# Patient Record
Sex: Female | Born: 1960 | Race: White | Hispanic: No | Smoking: Former smoker
Health system: Southern US, Community
[De-identification: ages and names within clinical notes are randomized; demographics above are authoritative.]

## PROBLEM LIST (undated history)

## (undated) DIAGNOSIS — T7840XA Allergy, unspecified, initial encounter: Secondary | ICD-10-CM

## (undated) DIAGNOSIS — G629 Polyneuropathy, unspecified: Secondary | ICD-10-CM

## (undated) DIAGNOSIS — J45909 Unspecified asthma, uncomplicated: Secondary | ICD-10-CM

## (undated) DIAGNOSIS — J449 Chronic obstructive pulmonary disease, unspecified: Secondary | ICD-10-CM

## (undated) DIAGNOSIS — M359 Systemic involvement of connective tissue, unspecified: Secondary | ICD-10-CM

## (undated) DIAGNOSIS — R519 Headache, unspecified: Secondary | ICD-10-CM

## (undated) DIAGNOSIS — K219 Gastro-esophageal reflux disease without esophagitis: Secondary | ICD-10-CM

## (undated) DIAGNOSIS — M797 Fibromyalgia: Secondary | ICD-10-CM

## (undated) DIAGNOSIS — G709 Myoneural disorder, unspecified: Secondary | ICD-10-CM

## (undated) DIAGNOSIS — R490 Dysphonia: Secondary | ICD-10-CM

## (undated) DIAGNOSIS — Z923 Personal history of irradiation: Secondary | ICD-10-CM

## (undated) DIAGNOSIS — K5792 Diverticulitis of intestine, part unspecified, without perforation or abscess without bleeding: Secondary | ICD-10-CM

## (undated) DIAGNOSIS — H9192 Unspecified hearing loss, left ear: Secondary | ICD-10-CM

## (undated) DIAGNOSIS — C801 Malignant (primary) neoplasm, unspecified: Secondary | ICD-10-CM

## (undated) DIAGNOSIS — I059 Rheumatic mitral valve disease, unspecified: Secondary | ICD-10-CM

## (undated) DIAGNOSIS — R1314 Dysphagia, pharyngoesophageal phase: Secondary | ICD-10-CM

## (undated) DIAGNOSIS — M329 Systemic lupus erythematosus, unspecified: Secondary | ICD-10-CM

## (undated) DIAGNOSIS — J383 Other diseases of vocal cords: Secondary | ICD-10-CM

## (undated) DIAGNOSIS — M1990 Unspecified osteoarthritis, unspecified site: Secondary | ICD-10-CM

## (undated) DIAGNOSIS — M069 Rheumatoid arthritis, unspecified: Secondary | ICD-10-CM

## (undated) DIAGNOSIS — M810 Age-related osteoporosis without current pathological fracture: Secondary | ICD-10-CM

## (undated) DIAGNOSIS — Z9221 Personal history of antineoplastic chemotherapy: Secondary | ICD-10-CM

## (undated) DIAGNOSIS — IMO0002 Reserved for concepts with insufficient information to code with codable children: Secondary | ICD-10-CM

## (undated) DIAGNOSIS — G35 Multiple sclerosis: Secondary | ICD-10-CM

## (undated) DIAGNOSIS — R569 Unspecified convulsions: Secondary | ICD-10-CM

## (undated) DIAGNOSIS — K589 Irritable bowel syndrome without diarrhea: Secondary | ICD-10-CM

## (undated) HISTORY — PX: TOTAL HIP ARTHROPLASTY: SHX124

## (undated) HISTORY — DX: Unspecified osteoarthritis, unspecified site: M19.90

## (undated) HISTORY — PX: BREAST SURGERY: SHX581

## (undated) HISTORY — PX: CHOLECYSTECTOMY: SHX55

## (undated) HISTORY — PX: APPENDECTOMY: SHX54

## (undated) HISTORY — PX: ABDOMINAL HYSTERECTOMY: SHX81

## (undated) HISTORY — PX: PATENT DUCTUS ARTERIOUS REPAIR: SHX269

## (undated) HISTORY — DX: Gastro-esophageal reflux disease without esophagitis: K21.9

## (undated) HISTORY — DX: Rheumatoid arthritis, unspecified: M06.9

## (undated) HISTORY — DX: Unspecified convulsions: R56.9

## (undated) HISTORY — DX: Myoneural disorder, unspecified: G70.9

## (undated) HISTORY — DX: Fibromyalgia: M79.7

## (undated) HISTORY — DX: Allergy, unspecified, initial encounter: T78.40XA

## (undated) HISTORY — DX: Unspecified hearing loss, left ear: H91.92

## (undated) HISTORY — PX: RECONSTRUCTION TENDON PULLEY HAND: SUR1090

---
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
## 2002-08-07 HISTORY — PX: BREAST EXCISIONAL BIOPSY: SUR124

## 2012-04-19 LAB — PULMONARY FUNCTION TEST

## 2014-06-29 LAB — PULMONARY FUNCTION TEST

## 2015-08-08 DIAGNOSIS — Z923 Personal history of irradiation: Secondary | ICD-10-CM

## 2015-08-08 HISTORY — DX: Personal history of irradiation: Z92.3

## 2015-08-08 HISTORY — PX: BREAST LUMPECTOMY: SHX2

## 2015-08-08 HISTORY — PX: BREAST EXCISIONAL BIOPSY: SUR124

## 2017-11-16 LAB — BASIC METABOLIC PANEL
BUN: 14 (ref 4–21)
Creatinine: 0.6 (ref <=1.1)
Glucose: 114
Potassium: 4.1 (ref 3.4–5.3)
SODIUM: 142 (ref 137–147)

## 2017-11-16 LAB — HEPATIC FUNCTION PANEL
ALK PHOS: 57 (ref 25–125)
ALT: 19 (ref 7–35)
AST: 17 (ref 13–35)

## 2018-05-05 ENCOUNTER — Encounter: Payer: Self-pay | Admitting: Emergency Medicine

## 2018-05-05 ENCOUNTER — Other Ambulatory Visit: Payer: Self-pay

## 2018-05-05 ENCOUNTER — Emergency Department
Admission: EM | Admit: 2018-05-05 | Discharge: 2018-05-06 | Disposition: A | Discharge status: Home / Self Care | Payer: Medicare Other | Attending: Emergency Medicine | Admitting: Emergency Medicine

## 2018-05-05 DIAGNOSIS — J45909 Unspecified asthma, uncomplicated: Secondary | ICD-10-CM | POA: Diagnosis not present

## 2018-05-05 DIAGNOSIS — K5900 Constipation, unspecified: Secondary | ICD-10-CM | POA: Diagnosis not present

## 2018-05-05 DIAGNOSIS — M791 Myalgia, unspecified site: Secondary | ICD-10-CM

## 2018-05-05 DIAGNOSIS — M7918 Myalgia, other site: Secondary | ICD-10-CM | POA: Insufficient documentation

## 2018-05-05 DIAGNOSIS — G35 Multiple sclerosis: Secondary | ICD-10-CM | POA: Diagnosis not present

## 2018-05-05 DIAGNOSIS — Z853 Personal history of malignant neoplasm of breast: Secondary | ICD-10-CM | POA: Diagnosis not present

## 2018-05-05 DIAGNOSIS — R1032 Left lower quadrant pain: Secondary | ICD-10-CM

## 2018-05-05 DIAGNOSIS — Z9049 Acquired absence of other specified parts of digestive tract: Secondary | ICD-10-CM | POA: Diagnosis not present

## 2018-05-05 DIAGNOSIS — Z760 Encounter for issue of repeat prescription: Secondary | ICD-10-CM

## 2018-05-05 HISTORY — DX: Rheumatic mitral valve disease, unspecified: I05.9

## 2018-05-05 HISTORY — DX: Systemic lupus erythematosus, unspecified: M32.9

## 2018-05-05 HISTORY — DX: Chronic obstructive pulmonary disease, unspecified: J44.9

## 2018-05-05 HISTORY — DX: Reserved for concepts with insufficient information to code with codable children: IMO0002

## 2018-05-05 HISTORY — DX: Malignant (primary) neoplasm, unspecified: C80.1

## 2018-05-05 HISTORY — DX: Diverticulitis of intestine, part unspecified, without perforation or abscess without bleeding: K57.92

## 2018-05-05 HISTORY — DX: Multiple sclerosis: G35

## 2018-05-05 HISTORY — DX: Unspecified asthma, uncomplicated: J45.909

## 2018-05-05 HISTORY — DX: Irritable bowel syndrome, unspecified: K58.9

## 2018-05-05 LAB — URINALYSIS, COMPLETE (UACMP) WITH MICROSCOPIC
BILIRUBIN URINE: NEGATIVE
Bacteria, UA: NONE SEEN
Glucose, UA: NEGATIVE mg/dL
Hgb urine dipstick: NEGATIVE
KETONES UR: NEGATIVE mg/dL
Leukocytes, UA: NEGATIVE
NITRITE: NEGATIVE
PH: 5 (ref 5.0–8.0)
Protein, ur: NEGATIVE mg/dL
Specific Gravity, Urine: 1.015 (ref 1.005–1.030)

## 2018-05-05 NOTE — ED Triage Notes (Signed)
Pt arrives via [facility] with c/o generalized body aches. Pt reports that she has not had her MS medication since June. Pt is in NAD.

## 2018-05-05 NOTE — ED Notes (Signed)
Pt has power port. Will consult with EDP on whether or not to access.

## 2018-05-06 ENCOUNTER — Encounter: Payer: Self-pay | Admitting: Radiology

## 2018-05-06 ENCOUNTER — Emergency Department: Payer: Medicare Other

## 2018-05-06 DIAGNOSIS — M7918 Myalgia, other site: Secondary | ICD-10-CM | POA: Diagnosis not present

## 2018-05-06 LAB — COMPREHENSIVE METABOLIC PANEL
ALBUMIN: 3.9 g/dL (ref 3.5–5.0)
ALT: 26 U/L (ref 0–44)
ANION GAP: 6 (ref 5–15)
AST: 24 U/L (ref 15–41)
Alkaline Phosphatase: 69 U/L (ref 38–126)
BUN: 21 mg/dL — ABNORMAL HIGH (ref 6–20)
CO2: 26 mmol/L (ref 22–32)
Calcium: 9.1 mg/dL (ref 8.9–10.3)
Chloride: 108 mmol/L (ref 98–111)
Creatinine, Ser: 0.56 mg/dL (ref 0.44–1.00)
GFR calc non Af Amer: >60 mL/min (ref >60)
GLUCOSE: 107 mg/dL — AB (ref 70–99)
POTASSIUM: 3.8 mmol/L (ref 3.5–5.1)
SODIUM: 140 mmol/L (ref 135–145)
TOTAL PROTEIN: 6.4 g/dL — AB (ref 6.5–8.1)
Total Bilirubin: 0.4 mg/dL (ref 0.3–1.2)

## 2018-05-06 LAB — CBC WITH DIFFERENTIAL/PLATELET
BASOS PCT: 1 %
Basophils Absolute: 0 10*3/uL (ref 0–0.1)
EOS ABS: 0.1 10*3/uL (ref 0–0.7)
Eosinophils Relative: 2 %
HCT: 35.3 % (ref 35.0–47.0)
Hemoglobin: 12.3 g/dL (ref 12.0–16.0)
LYMPHS ABS: 1.6 10*3/uL (ref 1.0–3.6)
Lymphocytes Relative: 27 %
MCH: 30.6 pg (ref 26.0–34.0)
MCHC: 34.7 g/dL (ref 32.0–36.0)
MCV: 88.1 fL (ref 80.0–100.0)
MONO ABS: 0.4 10*3/uL (ref 0.2–0.9)
MONOS PCT: 6 %
Neutro Abs: 3.7 10*3/uL (ref 1.4–6.5)
Neutrophils Relative %: 64 %
Platelets: 158 10*3/uL (ref 150–440)
RBC: 4.01 MIL/uL (ref 3.80–5.20)
RDW: 15.2 % — AB (ref 11.5–14.5)
WBC: 5.8 10*3/uL (ref 3.6–11.0)

## 2018-05-06 LAB — SEDIMENTATION RATE: Sed Rate: 20 mm/hr (ref 0–30)

## 2018-05-06 MED ORDER — IOPAMIDOL (ISOVUE-300) INJECTION 61%
100.0000 mL | Freq: Once | INTRAVENOUS | Status: AC | PRN
  Administered 2018-05-06: 100 mL via INTRAVENOUS

## 2018-05-06 MED ORDER — HEPARIN SOD (PORK) LOCK FLUSH 10 UNIT/ML IV SOLN
INTRAVENOUS | Status: AC
  Administered 2018-05-06: 50 [IU]
  Filled 2018-05-06: qty 1

## 2018-05-06 MED ORDER — MORPHINE SULFATE (PF) 4 MG/ML IV SOLN
4.0000 mg | Freq: Once | INTRAVENOUS | Status: AC
  Administered 2018-05-06: 4 mg via INTRAVENOUS
  Filled 2018-05-06: qty 1

## 2018-05-06 MED ORDER — LACTULOSE 10 GM/15ML PO SOLN: 20.0000 g | Freq: Every day | ORAL | 0 refills | Status: DC | PRN

## 2018-05-06 MED ORDER — TIOTROPIUM BROMIDE MONOHYDRATE 2.5 MCG/ACT IN AERS: 2.0000 | INHALATION_SPRAY | Freq: Every morning | RESPIRATORY_TRACT | 0 refills | Status: DC

## 2018-05-06 MED ORDER — BUDESONIDE-FORMOTEROL FUMARATE 160-4.5 MCG/ACT IN AERO: 2.0000 | INHALATION_SPRAY | Freq: Two times a day (BID) | RESPIRATORY_TRACT | 12 refills | Status: AC

## 2018-05-06 MED ORDER — ONDANSETRON HCL 4 MG/2ML IJ SOLN
4.0000 mg | Freq: Once | INTRAMUSCULAR | Status: AC
  Administered 2018-05-06: 4 mg via INTRAVENOUS

## 2018-05-06 MED ORDER — HYDROCODONE-ACETAMINOPHEN 5-325 MG PO TABS: 1.0000 | ORAL_TABLET | Freq: Four times a day (QID) | ORAL | 0 refills | Status: DC | PRN

## 2018-05-06 MED ORDER — ONDANSETRON HCL 4 MG/2ML IJ SOLN
INTRAMUSCULAR | Status: AC
  Filled 2018-05-06: qty 2

## 2018-05-06 MED ORDER — SODIUM CHLORIDE 0.9 % IV BOLUS
1000.0000 mL | Freq: Once | INTRAVENOUS | Status: AC
  Administered 2018-05-06: 1000 mL via INTRAVENOUS

## 2018-05-06 MED ORDER — IOPAMIDOL (ISOVUE-300) INJECTION 61%
30.0000 mL | Freq: Once | INTRAVENOUS | Status: AC | PRN
  Administered 2018-05-06: 30 mL via ORAL

## 2018-05-06 MED ORDER — OMEPRAZOLE 40 MG PO CPDR: 40.0000 mg | DELAYED_RELEASE_CAPSULE | Freq: Every day | ORAL | 0 refills | Status: DC

## 2018-05-06 MED ORDER — ONDANSETRON 4 MG PO TBDP: 4.0000 mg | ORAL_TABLET | Freq: Three times a day (TID) | ORAL | 0 refills | Status: DC | PRN

## 2018-05-06 MED ORDER — DABIGATRAN ETEXILATE MESYLATE 150 MG PO CAPS: 150.0000 mg | ORAL_CAPSULE | Freq: Two times a day (BID) | ORAL | 0 refills | Status: DC

## 2018-05-06 MED ORDER — KETOROLAC TROMETHAMINE 30 MG/ML IJ SOLN
30.0000 mg | Freq: Once | INTRAMUSCULAR | Status: AC
  Administered 2018-05-06: 30 mg via INTRAVENOUS
  Filled 2018-05-06: qty 1

## 2018-05-06 NOTE — Discharge Instructions (Signed)
1.  I have refilled your Pradaxa, omeprazole, Symbicort and Spiriva.  Please establish primary and specialty care at the numbers given above to refill your other medications. 2.  You may take lactulose as needed for bowel movements. 3.  You may take pain and nausea medicines as needed (Norco/Zofran #15). 4.  Return to the ER for worsening symptoms, persistent vomiting, difficulty breathing or other concerns.

## 2018-05-06 NOTE — ED Notes (Signed)
Ems here to pick pt up.  

## 2018-05-06 NOTE — ED Notes (Signed)
Heparin flush administered to pt's port before de-accessing. Pt tolerated well with no issues.

## 2018-05-06 NOTE — ED Provider Notes (Signed)
[facility] Emergency Department Provider Note   ____________________________________________   First MD Initiated Contact with Patient 05/06/18 0023     (approximate)  I have reviewed the triage vital signs and the nursing notes.   HISTORY  Chief Complaint Generalized Body Aches    HPI Victoria [name] is a 57 y.o. female who presents to the ED from home with a chief complaint of generalized body aches.  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  Patient reports she has not had any of her medications since moving here from [place] 3 months ago.  Her medications include MS medicines as well as Pradaxa.  States she had generalized body aches today.  Had a mechanical fall with lower back pain.  Prior to that patient had gradual onset left lower quadrant abdominal pain associated with nausea.  History of diverticulitis.  Denies associated fever, chills, chest pain, shortness of breath, dysuria.  Had some diarrhea prior to arrival which has since resolved.  Denies recent trauma or travel.   Past Medical History:  Diagnosis Date  . Asthma   . Cancer (HCC)    Breast Left  . COPD (chronic obstructive pulmonary disease) ([place])   . Diverticulitis   . IBS (irritable bowel syndrome)   . Lupus ([place])   . Mitral valve disorder   . MS (multiple sclerosis) ([place])     There are no active problems to display for this patient.   Past Surgical History:  Procedure Laterality Date  . ABDOMINAL HYSTERECTOMY    . APPENDECTOMY    . BREAST SURGERY    . CHOLECYSTECTOMY      Prior to Admission medications   Medication Sig Start Date End Date Taking? Authorizing Provider  budesonide-formoterol (SYMBICORT) 160-4.5 MCG/ACT inhaler Inhale 2 puffs into the lungs 2 (two) times daily. 05/06/18   Paulette Blanch, MD  dabigatran (PRADAXA) 150 MG CAPS capsule Take 1 capsule (150  mg total) by mouth 2 (two) times daily. 05/06/18   Paulette Blanch, MD  HYDROcodone-acetaminophen (NORCO) 5-325 MG tablet Take 1 tablet by mouth every 6 (six) hours as needed for moderate pain. 05/06/18   Paulette Blanch, MD  lactulose (CHRONULAC) 10 GM/15ML solution Take 30 mLs (20 g total) by mouth daily as needed for mild constipation. 05/06/18   Paulette Blanch, MD  omeprazole (PRILOSEC) 40 MG capsule Take 1 capsule (40 mg total) by mouth daily. 05/06/18   Paulette Blanch, MD  ondansetron (ZOFRAN ODT) 4 MG disintegrating tablet Take 1 tablet (4 mg total) by mouth every 8 (eight) hours as needed for nausea or vomiting. 05/06/18   Paulette Blanch, MD  Tiotropium Bromide Monohydrate (SPIRIVA RESPIMAT) 2.5 MCG/ACT AERS Inhale 2 puffs into the lungs every morning. 05/06/18   Paulette Blanch, MD    Allergies Codeine and Sulfa antibiotics  No family history on file.  Social History Social History   Tobacco Use  . Smoking status: Never Smoker  . Smokeless tobacco: Never Used  Substance Use Topics  . Alcohol use: Yes    Frequency: Never    Comment: Rarely  . Drug use: Never    Review of Systems  Constitutional: Positive for generalized body aches.  No fever/chills Eyes: No visual changes. ENT: No sore throat. Cardiovascular: Denies chest pain. Respiratory: Denies shortness of breath. Gastrointestinal: For left lower quadrant abdominal pain, nausea, vomiting  and diarrhea.  No constipation. Genitourinary: Negative for dysuria. Musculoskeletal: Negative for back pain. Skin: Negative for rash. Neurological: Negative for headaches, focal weakness or numbness.   ____________________________________________   PHYSICAL EXAM:  VITAL SIGNS: ED Triage Vitals  Enc Vitals Group     BP --      Pulse --      Resp --      Temp --      Temp src --      SpO2 --      Weight 05/05/18 2221 150 lb (68 kg)     Height 05/05/18 2221 4\' 7"  (1.397 m)     Head Circumference --      Peak Flow --      Pain Score  05/05/18 2211 10     Pain Loc --      Pain Edu? --      Excl. in [place]? --     Constitutional: Alert and oriented. Well appearing and in mild acute distress. Eyes: Conjunctivae are normal. PERRL. EOMI. Head: Atraumatic. Nose: No congestion/rhinnorhea. Mouth/Throat: Mucous membranes are moist.  Oropharynx non-erythematous. Neck: No stridor.  Supple neck without meningismus. Cardiovascular: Normal rate, regular rhythm. Grossly normal heart sounds.  Good peripheral circulation. Respiratory: Normal respiratory effort.  No retractions. Lungs CTAB. Gastrointestinal: Soft and mildly tender to palpation left lower quadrant without rebound or guarding. No distention. No abdominal bruits. No CVA tenderness. Musculoskeletal: No lower extremity tenderness nor edema.  No joint effusions. Neurologic:  Normal speech and language. No gross focal neurologic deficits are appreciated. No gait instability. Skin:  Skin is warm, dry and intact. No rash noted. Psychiatric: Mood and affect are normal. Speech and behavior are normal.  ____________________________________________   LABS (all labs ordered are listed, but only abnormal results are displayed)  Labs Reviewed  URINALYSIS, COMPLETE (UACMP) WITH MICROSCOPIC - Abnormal; Notable for the following components:      Result Value   Color, Urine YELLOW (*)    APPearance CLEAR (*)    All other components within normal limits  CBC WITH DIFFERENTIAL/PLATELET - Abnormal; Notable for the following components:   RDW 15.2 (*)    All other components within normal limits  COMPREHENSIVE METABOLIC PANEL - Abnormal; Notable for the following components:   Glucose, Bld 107 (*)    BUN 21 (*)    Total Protein 6.4 (*)    All other components within normal limits  SEDIMENTATION RATE   ____________________________________________  EKG  None ____________________________________________  RADIOLOGY  ED MD interpretation: Unremarkable CT abdomen  pelvis  Official radiology report(s): Ct Abdomen Pelvis W Contrast  Result Date: 05/06/2018 CLINICAL DATA:  Generalized body aches. Off multiple sclerosis medications since June. History of LEFT breast cancer, appendectomy, hysterectomy and cholecystectomy. EXAM: CT ABDOMEN AND PELVIS WITH CONTRAST TECHNIQUE: Multidetector CT imaging of the abdomen and pelvis was performed using the standard protocol following bolus administration of intravenous contrast. CONTRAST:  145mL ISOVUE-300 IOPAMIDOL (ISOVUE-300) INJECTION 61% COMPARISON:  None. FINDINGS: LOWER CHEST: Lung bases are clear. Included heart size is normal. No pericardial effusion. HEPATOBILIARY: Status post cholecystectomy. Normal CT liver. PANCREAS: Normal. SPLEEN: Normal. ADRENALS/URINARY TRACT: Kidneys are orthotopic, demonstrating symmetric enhancement. No nephrolithiasis, hydronephrosis or solid renal masses. The unopacified ureters are normal in course and caliber. Delayed imaging through the kidneys demonstrates symmetric prompt contrast excretion within the proximal urinary collecting system. Urinary bladder is partially distended and unremarkable. Normal adrenal glands. STOMACH/BOWEL: The stomach, small and large bowel are normal in course and  caliber without inflammatory changes. Moderate descending and sigmoid colonic diverticulosis. Enteric contrast has not yet reached the distal small bowel. Small amount of small bowel feces compatible with chronic stasis. VASCULAR/LYMPHATIC: Aortoiliac vessels are normal in course and caliber. Minimal calcific atherosclerosis. Inferior vena cava filter at the level the renal veins. No lymphadenopathy by CT size criteria. REPRODUCTIVE: Status post hysterectomy. OTHER: No intraperitoneal free fluid or free air. Small fat containing umbilical hernia. MUSCULOSKELETAL: Nonacute. Streak artifact from RIGHT hip arthroplasty. Mild lower lumbar facet arthropathy. IMPRESSION: 1. No acute intra-abdominal or pelvic  process. 2. Colonic diverticulosis without acute diverticulitis. Aortic Atherosclerosis (ICD10-I70.0). Electronically Signed   By: Elon Alas M.D.   On: 05/06/2018 02:50    ____________________________________________   PROCEDURES  Procedure(s) performed: None  Procedures  Critical Care performed: No  ____________________________________________   INITIAL IMPRESSION / ASSESSMENT AND PLAN / ED COURSE  As part of my medical decision making, I reviewed the following data within the [facility] notes reviewed and incorporated, Labs reviewed, Old chart reviewed, Radiograph reviewed  and Notes from prior ED visits   57 year old female with lupus/possible MS, IBS, history of diverticulitis who presents with generalized body pain and left lower quadrant abdominal pain. Differential diagnosis includes, but is not limited to, ovarian cyst, ovarian torsion, acute appendicitis, diverticulitis, urinary tract infection/pyelonephritis, endometriosis, bowel obstruction, colitis, renal colic, gastroenteritis, hernia, etc.  Will obtain basic lab work including sed rate.  Initiate IV fluid resuscitation.  4 mg IV morphine paired with 4 mg IV Zofran given for pain and nausea.  Add IV Toradol.  Will proceed with CT abdomen/pelvis to evaluate etiology of patient's abdominal pain.  Clinical Course as of May 06 352  Mon May 06, 2018  0343 Patient afebrile on taking oral temperature-98.8 F.  Updated her on unremarkable CT abdomen/pelvis.  I personally reviewed the CT scan and note moderate stool burden in her lower colon.  Will prescribe lactulose as needed.  Will refill her Pradaxa, omeprazole, Symbicort and Spiriva.  I am uncomfortable refilling her prescriptions for trazodone, letrozole, leflunomide, and cyclobenzaprine. Refer for PCP follow-up.  Strict return precautions given.  Patient verbalizes understanding and agrees with plan of care.   [JS]    Clinical Course User  Index [JS] Paulette Blanch, MD     ____________________________________________   FINAL CLINICAL IMPRESSION(S) / ED DIAGNOSES  Final diagnoses:  Myalgia  Left lower quadrant pain  Constipation, unspecified constipation type     ED Discharge Orders         Ordered    dabigatran (PRADAXA) 150 MG CAPS capsule  2 times daily     05/06/18 0351    omeprazole (PRILOSEC) 40 MG capsule  Daily     05/06/18 0351    budesonide-formoterol (SYMBICORT) 160-4.5 MCG/ACT inhaler  2 times daily     05/06/18 0351    Tiotropium Bromide Monohydrate (SPIRIVA RESPIMAT) 2.5 MCG/ACT AERS   Every morning - 10a     05/06/18 0351    HYDROcodone-acetaminophen (NORCO) 5-325 MG tablet  Every 6 hours PRN     05/06/18 0352    ondansetron (ZOFRAN ODT) 4 MG disintegrating tablet  Every 8 hours PRN     05/06/18 0352    lactulose (CHRONULAC) 10 GM/15ML solution  Daily PRN     05/06/18 0352           Note:  This document was prepared using Dragon voice recognition software and may include unintentional dictation errors.  Paulette Blanch, MD 05/06/18 [phone]

## 2018-05-06 NOTE — ED Notes (Signed)
Patient transported to CT 

## 2018-05-06 NOTE — ED Notes (Signed)
Pt assisted up to commode to void.  

## 2018-06-04 ENCOUNTER — Encounter: Payer: Self-pay | Admitting: Emergency Medicine

## 2018-06-04 ENCOUNTER — Other Ambulatory Visit: Payer: Self-pay

## 2018-06-04 ENCOUNTER — Emergency Department: Payer: Medicare Other

## 2018-06-04 ENCOUNTER — Emergency Department
Admission: EM | Admit: 2018-06-04 | Discharge: 2018-06-04 | Disposition: A | Discharge status: Home / Self Care | Payer: Medicare Other | Attending: Emergency Medicine | Admitting: Emergency Medicine

## 2018-06-04 DIAGNOSIS — Z79899 Other long term (current) drug therapy: Secondary | ICD-10-CM | POA: Diagnosis not present

## 2018-06-04 DIAGNOSIS — Z9049 Acquired absence of other specified parts of digestive tract: Secondary | ICD-10-CM | POA: Diagnosis not present

## 2018-06-04 DIAGNOSIS — R52 Pain, unspecified: Secondary | ICD-10-CM

## 2018-06-04 DIAGNOSIS — G35 Multiple sclerosis: Secondary | ICD-10-CM | POA: Insufficient documentation

## 2018-06-04 DIAGNOSIS — J45909 Unspecified asthma, uncomplicated: Secondary | ICD-10-CM | POA: Diagnosis not present

## 2018-06-04 DIAGNOSIS — R519 Headache, unspecified: Secondary | ICD-10-CM

## 2018-06-04 DIAGNOSIS — M79601 Pain in right arm: Secondary | ICD-10-CM | POA: Insufficient documentation

## 2018-06-04 DIAGNOSIS — Z853 Personal history of malignant neoplasm of breast: Secondary | ICD-10-CM | POA: Diagnosis not present

## 2018-06-04 DIAGNOSIS — R51 Headache: Secondary | ICD-10-CM | POA: Insufficient documentation

## 2018-06-04 DIAGNOSIS — M5410 Radiculopathy, site unspecified: Secondary | ICD-10-CM | POA: Insufficient documentation

## 2018-06-04 DIAGNOSIS — J449 Chronic obstructive pulmonary disease, unspecified: Secondary | ICD-10-CM | POA: Diagnosis not present

## 2018-06-04 DIAGNOSIS — R531 Weakness: Secondary | ICD-10-CM | POA: Diagnosis present

## 2018-06-04 LAB — COMPREHENSIVE METABOLIC PANEL
ALK PHOS: 62 U/L (ref 38–126)
ALT: 24 U/L (ref 0–44)
ANION GAP: 11 (ref 5–15)
AST: 21 U/L (ref 15–41)
Albumin: 3.7 g/dL (ref 3.5–5.0)
BILIRUBIN TOTAL: 0.4 mg/dL (ref 0.3–1.2)
BUN: 18 mg/dL (ref 6–20)
CALCIUM: 9.2 mg/dL (ref 8.9–10.3)
CO2: 23 mmol/L (ref 22–32)
CREATININE: 0.55 mg/dL (ref 0.44–1.00)
Chloride: 106 mmol/L (ref 98–111)
Glucose, Bld: 88 mg/dL (ref 70–99)
Potassium: 3.8 mmol/L (ref 3.5–5.1)
SODIUM: 140 mmol/L (ref 135–145)
TOTAL PROTEIN: 6.2 g/dL — AB (ref 6.5–8.1)

## 2018-06-04 LAB — CBC
HCT: 37.1 % (ref 36.0–46.0)
Hemoglobin: 11.7 g/dL — ABNORMAL LOW (ref 12.0–15.0)
MCH: 29 pg (ref 26.0–34.0)
MCHC: 31.5 g/dL (ref 30.0–36.0)
MCV: 91.8 fL (ref 80.0–100.0)
NRBC: 0 % (ref 0.0–0.2)
Platelets: 138 10*3/uL — ABNORMAL LOW (ref 150–400)
RBC: 4.04 MIL/uL (ref 3.87–5.11)
RDW: 14.6 % (ref 11.5–15.5)
WBC: 4.5 10*3/uL (ref 4.0–10.5)

## 2018-06-04 LAB — TROPONIN I: Troponin I: <0.03 ng/mL (ref <0.03)

## 2018-06-04 LAB — PROTIME-INR
INR: 1.04
PROTHROMBIN TIME: 13.5 s (ref 11.4–15.2)

## 2018-06-04 MED ORDER — PREDNISONE 10 MG (21) PO TBPK: ORAL_TABLET | ORAL | 0 refills | Status: DC

## 2018-06-04 MED ORDER — BUTALBITAL-APAP-CAFFEINE 50-325-40 MG PO TABS
2.0000 | ORAL_TABLET | Freq: Once | ORAL | Status: AC
  Administered 2018-06-04: 2 via ORAL
  Filled 2018-06-04: qty 2

## 2018-06-04 MED ORDER — BUTALBITAL-APAP-CAFFEINE 50-325-40 MG PO TABS: 1.0000 | ORAL_TABLET | Freq: Four times a day (QID) | ORAL | 0 refills | Status: AC | PRN

## 2018-06-04 MED ORDER — HEPARIN SOD (PORK) LOCK FLUSH 100 UNIT/ML IV SOLN
INTRAVENOUS | Status: AC
  Administered 2018-06-04: 20:00:00
  Filled 2018-06-04: qty 5

## 2018-06-04 NOTE — ED Notes (Signed)
Patient stating that her right shoulder blade is burning and hurts. Anda Kraft, RN notified.

## 2018-06-04 NOTE — ED Notes (Signed)
Pt assisted to bathroom

## 2018-06-04 NOTE — ED Provider Notes (Signed)
[facility] Emergency Department Provider Note  ____________________________________________   None    (approximate)  I have reviewed the triage vital signs and the nursing notes.   HISTORY  Chief Complaint Arm Pain   HPI Mallissa Lorenzen is a 57 y.o. female who presents to the emergency department for treatment and evaluation of multiple medical complaints. She was sent here for from [facility] due to "lumps" in her right arm. She noticed them about a month ago. They start in the right shoulder and continue to the wrist. She has a history of lupus and "possible" multiple sclerosis and feels that she has some weakness in her right arm and leg. She reports a headache that starts at the base of her head and goes up to the crown. She also has a history of a clotting disorder and has had DVT with filter. She has had a gap in her health care due to moving here from [place]. She does not have a current PCP. She was evaluated here in September and some of her medications were restarted, but not all. She is currently compliant with her Pradaxa, Prilosec and Symbicort.   Past Medical History:  Diagnosis Date  . Asthma   . Cancer (HCC)    Breast Left  . COPD (chronic obstructive pulmonary disease) ([place])   . Diverticulitis   . IBS (irritable bowel syndrome)   . Lupus ([place])   . Mitral valve disorder   . MS (multiple sclerosis) ([place])     There are no active problems to display for this patient.   Past Surgical History:  Procedure Laterality Date  . ABDOMINAL HYSTERECTOMY    . APPENDECTOMY    . BREAST SURGERY    . CHOLECYSTECTOMY      Prior to Admission medications   Medication Sig Start Date End Date Taking? Authorizing Provider  budesonide-formoterol (SYMBICORT) 160-4.5 MCG/ACT inhaler Inhale 2 puffs into the lungs 2 (two) times daily. 05/06/18   Paulette Blanch, MD  butalbital-acetaminophen-caffeine (FIORICET, ESGIC) 248 288 8405 MG tablet Take 1 tablet by mouth every 6  (six) hours as needed for up to 5 days for headache. 06/04/18 06/09/18  Zyshonne Malecha, Dessa Phi, FNP  dabigatran (PRADAXA) 150 MG CAPS capsule Take 1 capsule (150 mg total) by mouth 2 (two) times daily. 05/06/18   Paulette Blanch, MD  HYDROcodone-acetaminophen (NORCO) 5-325 MG tablet Take 1 tablet by mouth every 6 (six) hours as needed for moderate pain. 05/06/18   Paulette Blanch, MD  lactulose (CHRONULAC) 10 GM/15ML solution Take 30 mLs (20 g total) by mouth daily as needed for mild constipation. 05/06/18   Paulette Blanch, MD  omeprazole (PRILOSEC) 40 MG capsule Take 1 capsule (40 mg total) by mouth daily. 05/06/18   Paulette Blanch, MD  ondansetron (ZOFRAN ODT) 4 MG disintegrating tablet Take 1 tablet (4 mg total) by mouth every 8 (eight) hours as needed for nausea or vomiting. 05/06/18   Paulette Blanch, MD  predniSONE (STERAPRED UNI-PAK 21 TAB) 10 MG (21) TBPK tablet Take 6 tablets on the first day and decrease by 1 tablet each day until finished. 06/04/18   Careena Degraffenreid, Johnette Abraham B, FNP  Tiotropium Bromide Monohydrate (SPIRIVA RESPIMAT) 2.5 MCG/ACT AERS Inhale 2 puffs into the lungs every morning. 05/06/18   Paulette Blanch, MD    Allergies Codeine and Sulfa antibiotics  No family history on file.  Social History Social History   Tobacco Use  . Smoking status: Never Smoker  . Smokeless  tobacco: Never Used  Substance Use Topics  . Alcohol use: Yes    Frequency: Never    Comment: Rarely  . Drug use: Never    Review of Systems  Constitutional: No fever/chills Eyes: No visual changes. ENT: No sore throat. Cardiovascular: Denies chest pain. Respiratory: Denies shortness of breath. Gastrointestinal: No abdominal pain.  No nausea, no vomiting.  No diarrhea.  No constipation. Genitourinary: Negative for dysuria. Musculoskeletal: Negative for back pain. Skin: Negative for rash. Neurological: Negative for headaches, focal weakness or numbness. ___________________________________________   PHYSICAL EXAM:  VITAL  SIGNS: ED Triage Vitals  Enc Vitals Group     BP 06/04/18 1309 137/62     Pulse Rate 06/04/18 1309 65     Resp --      Temp 06/04/18 1309 98.2 F (36.8 C)     Temp Source 06/04/18 1309 Oral     SpO2 06/04/18 1309 98 %     Weight 06/04/18 1310 149 lb 14.6 oz (68 kg)     Height 06/04/18 1310 4\' 7"  (1.397 m)     Head Circumference --      Peak Flow --      Pain Score 06/04/18 1310 9     Pain Loc --      Pain Edu? --      Excl. in [place]? --     Constitutional: Alert and oriented. Well appearing and in no acute distress. Eyes: Conjunctivae are normal. PERRL. EOMI. Head: Atraumatic. Nose: No congestion/rhinnorhea. Mouth/Throat: Mucous membranes are moist.  Oropharynx non-erythematous. Neck: No stridor.   Cardiovascular: Normal rate, regular rhythm. Grossly normal heart sounds.  Good peripheral circulation. Respiratory: Normal respiratory effort.  No retractions. Lungs CTAB. Gastrointestinal: Soft and nontender. No distention. No abdominal bruits. No CVA tenderness. Musculoskeletal: No lower extremity tenderness nor edema.  No joint effusions. Neurologic:  Normal speech and language. No gross focal neurologic deficits are appreciated. No gait instability. Skin:  Skin is warm, dry and intact. No rash noted. Psychiatric: Mood and affect are normal. Speech and behavior are normal.  ____________________________________________   LABS (all labs ordered are listed, but only abnormal results are displayed)  Labs Reviewed  CBC - Abnormal; Notable for the following components:      Result Value   Hemoglobin 11.7 (*)    Platelets 138 (*)    All other components within normal limits  COMPREHENSIVE METABOLIC PANEL - Abnormal; Notable for the following components:   Total Protein 6.2 (*)    All other components within normal limits  TROPONIN I  PROTIME-INR   ____________________________________________  EKG  ED ECG REPORT I, Sherrie George, the attending physician, personally viewed  and interpreted this ECG.   Date: 06/04/2018  EKG Time: 1321  Rate: 71  Rhythm: normal EKG, normal sinus rhythm  Axis: normal  Intervals:none  ST&T Change: no elevation  ____________________________________________  RADIOLOGY  ED MD interpretation:  CT head negative for acute findings per radiology.  Official radiology report(s): Dg Chest 2 View  Result Date: 06/04/2018 CLINICAL DATA:  Wheezing. EXAM: CHEST - 2 VIEW COMPARISON:  CT abdomen 05/06/2018. FINDINGS: PowerPort catheter with tip at cavoatrial junction. Cardiomegaly with normal pulmonary vascularity. No focal infiltrate. No pleural effusion or pneumothorax. Thoracic spine scoliosis and degenerative change. Surgical clips right upper quadrant. IVC filter noted with tip at the level of the upper lumbar spine. IMPRESSION: 1.  Cardiomegaly.  No pulmonary venous congestion. 2.  Mild bibasilar atelectasis.  No focal infiltrate. 3. IVC filter noted  with its tip at the level of the upper lumbar spine. Prior cholecystectomy. Electronically Signed   By: Marcello Moores  Register   On: 06/04/2018 14:17   Ct Head Wo Contrast  Result Date: 06/04/2018 CLINICAL DATA:  Headache. EXAM: CT HEAD WITHOUT CONTRAST TECHNIQUE: Contiguous axial images were obtained from the base of the skull through the vertex without intravenous contrast. COMPARISON:  None. FINDINGS: Brain: Mild chronic ischemic white matter disease is noted. No mass effect or midline shift is noted. Ventricular size is within normal limits. There is no evidence of mass lesion, hemorrhage or acute infarction. Vascular: No hyperdense vessel or unexpected calcification. Skull: Normal. Negative for fracture or focal lesion. Sinuses/Orbits: No acute finding. Other: None. IMPRESSION: Mild chronic ischemic white matter disease. No acute intracranial abnormality seen. Electronically Signed   By: Marijo Conception, M.D.   On: 06/04/2018 17:08   US Venous Img Upper Uni Right  Result Date:  06/04/2018 CLINICAL DATA:  Right upper arm pain for 2 weeks. EXAM: Right UPPER EXTREMITY VENOUS DOPPLER ULTRASOUND TECHNIQUE: Gray-scale sonography with graded compression, as well as color Doppler and duplex ultrasound were performed to evaluate the upper extremity deep venous system from the level of the subclavian vein and including the jugular, axillary, basilic, radial, ulnar and upper cephalic vein. Spectral Doppler was utilized to evaluate flow at rest and with distal augmentation maneuvers. COMPARISON:  None. FINDINGS: Contralateral Subclavian Vein: Respiratory phasicity is normal and symmetric with the symptomatic side. No evidence of thrombus. Normal compressibility. Internal Jugular Vein: No evidence of thrombus. Normal compressibility, respiratory phasicity and response to augmentation. Subclavian Vein: No evidence of thrombus. Normal compressibility, respiratory phasicity and response to augmentation. Axillary Vein: No evidence of thrombus. Normal compressibility, respiratory phasicity and response to augmentation. Cephalic Vein: No evidence of thrombus. Normal compressibility, respiratory phasicity and response to augmentation. Basilic Vein: No evidence of thrombus. Normal compressibility, respiratory phasicity and response to augmentation. Brachial Veins: No evidence of thrombus. Normal compressibility, respiratory phasicity and response to augmentation. Radial Veins: No evidence of thrombus. Normal compressibility, respiratory phasicity and response to augmentation. Ulnar Veins: No evidence of thrombus. Normal compressibility, respiratory phasicity and response to augmentation. Venous Reflux:  None visualized. Other Findings:  None visualized. IMPRESSION: No evidence of DVT within the right upper extremity. Electronically Signed   By: Kerby Moors M.D.   On: 06/04/2018 18:01    ____________________________________________   PROCEDURES  Procedure(s) performed: None  Procedures  Critical  Care performed: No  ____________________________________________   INITIAL IMPRESSION / ASSESSMENT AND PLAN / ED COURSE  As part of my medical decision making, I reviewed the following data within the electronic MEDICAL RECORD NUMBER Notes from prior ED visits  57 year old female who presents to the emergency department for treatment and evaluation of multiple medical complaints.  Images performed tonight and exam are reassuring.  The patient will be discharged home with a prescription for prednisone and Fioricet.  While in the department she received Fioricet and stated that it provided her some relief of her headache.  She has an appointment scheduled with Dr. Ouida Sills in late December and she was advised to call to see if that appointment could be moved up.  Patient is to return to the emergency department for symptoms of change or worsen if she is unable to schedule an appointment with primary care.      ____________________________________________   FINAL CLINICAL IMPRESSION(S) / ED DIAGNOSES  Final diagnoses:  Pain  Generalized headache  Arm pain,  diffuse, right  Radiculopathy with lower extremity symptoms     ED Discharge Orders         Ordered    predniSONE (STERAPRED UNI-PAK 21 TAB) 10 MG (21) TBPK tablet     06/04/18 1901    butalbital-acetaminophen-caffeine (FIORICET, ESGIC) 50-325-40 MG tablet  Every 6 hours PRN     06/04/18 1901           Note:  This document was prepared using Dragon voice recognition software and may include unintentional dictation errors.    Victorino Dike, FNP 06/05/18 0001    Lavonia Drafts, MD 06/06/18 [phone]

## 2018-06-04 NOTE — Discharge Instructions (Signed)
Please call to see if you can schedule an earlier appointment with Dr. Ouida Sills. Return to the ER for symptoms that change or worsen if unable to schedule an earlier appointment.

## 2018-06-04 NOTE — ED Triage Notes (Addendum)
Sent from [facility] walkin.  Says t hey are worried about clot in right arm.  Says has knots on it.  Said she was not sick otherwise, but then says she has been wheezing at night and feeling heaviness in lungs recently.

## 2018-06-04 NOTE — ED Notes (Signed)
Pt states she was sent from [facility] for eval of her RUE for possible DVT, states "I have tumors from my shoulder to my hand" states she has noticed them for the past month. Pt also c/o HA with photophobia and right leg pain. States she has a hx of CA, MS and lupus. States she has been out of her meds due to lack of insurance and a recent move and has not established care in the area yet.

## 2018-06-17 ENCOUNTER — Encounter (INDEPENDENT_AMBULATORY_CARE_PROVIDER_SITE_OTHER): Payer: Self-pay

## 2018-06-17 ENCOUNTER — Encounter: Payer: Self-pay | Admitting: Family Medicine

## 2018-06-17 ENCOUNTER — Other Ambulatory Visit: Payer: Self-pay | Admitting: Family Medicine

## 2018-06-17 ENCOUNTER — Ambulatory Visit (INDEPENDENT_AMBULATORY_CARE_PROVIDER_SITE_OTHER): Payer: Medicare Other | Admitting: Family Medicine

## 2018-06-17 VITALS — BP 102/62 | HR 93 | Temp 98.0°F | Ht <= 58 in | Wt 194.0 lb

## 2018-06-17 DIAGNOSIS — Z23 Encounter for immunization: Secondary | ICD-10-CM | POA: Diagnosis not present

## 2018-06-17 DIAGNOSIS — M069 Rheumatoid arthritis, unspecified: Secondary | ICD-10-CM | POA: Diagnosis not present

## 2018-06-17 DIAGNOSIS — J4540 Moderate persistent asthma, uncomplicated: Secondary | ICD-10-CM

## 2018-06-17 DIAGNOSIS — D689 Coagulation defect, unspecified: Secondary | ICD-10-CM

## 2018-06-17 DIAGNOSIS — Z7689 Persons encountering health services in other specified circumstances: Secondary | ICD-10-CM

## 2018-06-17 DIAGNOSIS — M328 Other forms of systemic lupus erythematosus: Secondary | ICD-10-CM

## 2018-06-17 DIAGNOSIS — R059 Cough, unspecified: Secondary | ICD-10-CM

## 2018-06-17 DIAGNOSIS — Z853 Personal history of malignant neoplasm of breast: Secondary | ICD-10-CM

## 2018-06-17 DIAGNOSIS — R05 Cough: Secondary | ICD-10-CM

## 2018-06-17 DIAGNOSIS — J449 Chronic obstructive pulmonary disease, unspecified: Secondary | ICD-10-CM

## 2018-06-17 DIAGNOSIS — G4700 Insomnia, unspecified: Secondary | ICD-10-CM

## 2018-06-17 DIAGNOSIS — Z86718 Personal history of other venous thrombosis and embolism: Secondary | ICD-10-CM

## 2018-06-17 DIAGNOSIS — M25552 Pain in left hip: Secondary | ICD-10-CM

## 2018-06-17 DIAGNOSIS — Z7901 Long term (current) use of anticoagulants: Secondary | ICD-10-CM

## 2018-06-17 DIAGNOSIS — IMO0001 Reserved for inherently not codable concepts without codable children: Secondary | ICD-10-CM

## 2018-06-17 MED ORDER — LETROZOLE 2.5 MG PO TABS: 2.5000 mg | ORAL_TABLET | Freq: Every day | ORAL | 3 refills | Status: DC

## 2018-06-17 MED ORDER — PREDNISONE 20 MG PO TABS: ORAL_TABLET | ORAL | 0 refills | Status: DC

## 2018-06-17 MED ORDER — DABIGATRAN ETEXILATE MESYLATE 150 MG PO CAPS: 150.0000 mg | ORAL_CAPSULE | Freq: Two times a day (BID) | ORAL | 3 refills | Status: DC

## 2018-06-17 MED ORDER — TRAZODONE HCL 50 MG PO TABS: 150.0000 mg | ORAL_TABLET | Freq: Every day | ORAL | 1 refills | Status: AC

## 2018-06-17 MED ORDER — TIOTROPIUM BROMIDE MONOHYDRATE 2.5 MCG/ACT IN AERS: 2.0000 | INHALATION_SPRAY | Freq: Every morning | RESPIRATORY_TRACT | 11 refills | Status: AC

## 2018-06-17 MED ORDER — TIOTROPIUM BROMIDE MONOHYDRATE 2.5 MCG/ACT IN AERS: 2.0000 | INHALATION_SPRAY | Freq: Every morning | RESPIRATORY_TRACT | 0 refills | Status: DC

## 2018-06-17 MED ORDER — ALBUTEROL SULFATE HFA 108 (90 BASE) MCG/ACT IN AERS: 2.0000 | INHALATION_SPRAY | Freq: Four times a day (QID) | RESPIRATORY_TRACT | 2 refills | Status: DC | PRN

## 2018-06-17 MED ORDER — BENZONATATE 100 MG PO CAPS: 100.0000 mg | ORAL_CAPSULE | Freq: Three times a day (TID) | ORAL | 0 refills | Status: DC | PRN

## 2018-06-17 NOTE — Progress Notes (Signed)
Subjective:    Patient ID: Victoria [name], female    DOB: 08-Feb-1961, 57 y.o.   MRN: [id number]  HPI This is a 57 yo female who presents today to establish care. She moved to this area from [place] in June to be closer to her family (grandson). Lives by herself. Has a dog. Enjoys singing in church. She was a Clinical cytogeneticist for a long time.   She was seen 06/06/18 in the ER for multiple complaints after being seen at [facility] for an acute visit. There was concern that she had an UE DVT which was ruled out. 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 She takes pradaxa- no bleeding. She was off her pradaxa for several months and it was restarted after her ER visit. She has only been taking one a day instead of two because she didn't want to run out of medication.   Left breast cancer- DCIS, radiation and partial mastectomy 2017  COPD and asthma- takes Symbicort. Has had recent hoarseness, cough productive of small amounts of sputum. No fever, "I don't run fevers." Out of Spiriva and rescue inhaler. Does not have a nebulizer machine, uses handheld inhalers.   Lupus- followed with rheumatology in [place], frequent flares per patient  MS- "not fully diagnosed," was suggested she follow up at [facility] in [facility] clinic. Has history of MS lesions on her brain according to the patient.   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 Has seen cardiology in past.   Insomnia- has been off of trazadone for several months, has not had a full night's sleep since June.   Left hip pain- for at least a week, pain with lifting leg, known DDD. Pain in groin. No known injury.   Last CPE- last year Mammo- within last several months Pap- complete hysterectomy with oopherectomy Tdap- unsure, will check records Flu- today Eye- regular since on plaquenil  Dental-  Not regular Exercise- Walks dog daily for 1.5 miles  Past Medical History:  Diagnosis Date  . Asthma   . Cancer (HCC)    Breast Left  . COPD (chronic  obstructive pulmonary disease) ([place])   . Diverticulitis   . IBS (irritable bowel syndrome)   . Lupus ([place])   . Mitral valve disorder   . MS (multiple sclerosis) ([place])    Past Surgical History:  Procedure Laterality Date  . ABDOMINAL HYSTERECTOMY    . APPENDECTOMY    . BREAST SURGERY    . CHOLECYSTECTOMY     Family History  Problem Relation Age of Onset  . COPD Mother   . Stroke Father   . Arthritis Sister   . Diabetes Sister   . Stroke Sister    Social History   Tobacco Use  . Smoking status: Former Research scientist (life sciences)  . Smokeless tobacco: Never Used  Substance Use Topics  . Alcohol use: Yes    Frequency: Never    Comment: Rarely  . Drug use: Never      Review of Systems Per HPI    Objective:   Physical Exam  Constitutional: She is oriented to person, place, and time. She appears well-developed and well-nourished. No distress.  HENT:  Head: Normocephalic and atraumatic.  Eyes: Conjunctivae are normal.  Neck: Normal range of motion.  Cardiovascular: Normal rate, regular rhythm and normal heart sounds.  Pulmonary/Chest: Effort normal and breath sounds normal.  Occasional cough.   Musculoskeletal: She exhibits no edema.       Left hip: She exhibits decreased range  of motion, decreased strength and tenderness.       Lumbar back: She exhibits decreased range of motion, tenderness and bony tenderness.  Some decreased ROM low back and left hip. Pain with flexion, internal and external rotation.   Neurological: She is alert and oriented to person, place, and time.  Skin: Skin is warm and dry. She is not diaphoretic.  Psychiatric: She has a normal mood and affect. Her behavior is normal. Judgment and thought content normal.  Vitals reviewed.     BP 102/62 (BP Location: Right Arm, Patient Position: Sitting, Cuff Size: Large)   Pulse 93   Temp 98 F (36.7 C) (Oral)   Ht 4\' 7"  (1.397 m)   Wt 194 lb (88 kg)   SpO2 96%   BMI 45.09 kg/m      Assessment & Plan:  1.  Encounter to establish care - will request records from all of her recent providers  2. Other forms of systemic lupus erythematosus, unspecified organ involvement status ([place]) - has been on plaquenil in past, off since June, will get rheumatology input  - Ambulatory referral to Rheumatology  3. Moderate persistent asthma without complication - albuterol (PROVENTIL HFA;VENTOLIN HFA) 108 (90 Base) MCG/ACT inhaler; Inhale 2 puffs into the lungs every 6 (six) hours as needed for wheezing or shortness of breath.  Dispense: 1 Inhaler; Refill: 2  4. Rheumatoid arthritis involving multiple sites, unspecified rheumatoid factor presence ([place]) - Ambulatory referral to Rheumatology - predniSONE (DELTASONE) 20 MG tablet; Take 3 x 3 days, 2 x 3 days, 1 x 3 days  Dispense: 18 tablet; Refill: 0  5. History of breast cancer - letrozole (FEMARA) 2.5 MG tablet; Take 1 tablet (2.5 mg total) by mouth daily.  Dispense: 90 tablet; Refill: 3 - Ambulatory referral to Oncology  6. Clotting disorder ([place]) - Ambulatory referral to Oncology  7. Chronic anticoagulation - Ambulatory referral to Oncology  8. Left hip pain - heat, range of motion - predniSONE (DELTASONE) 20 MG tablet; Take 3 x 3 days, 2 x 3 days, 1 x 3 days  Dispense: 18 tablet; Refill: 0  9. Cough - not likely bacterial, will treat symptomatically and she is getting prednisone for hip pain, RTC precautions reviewed - benzonatate (TESSALON) 100 MG capsule; Take 1-2 capsules (100-200 mg total) by mouth 3 (three) times daily as needed.  Dispense: 30 capsule; Refill: 0  10. Need for influenza vaccination - Flu Vaccine QUAD 36+ mos IM  11. Insomnia, unspecified type - traZODone (DESYREL) 50 MG tablet; Take 3 tablets (150 mg total) by mouth at bedtime.  Dispense: 180 tablet; Refill: 1  12. COPD not affecting current episode of care ([place]) - Tiotropium Bromide Monohydrate (SPIRIVA RESPIMAT) 2.5 MCG/ACT AERS; Inhale 2 puffs into the lungs every  morning.  Dispense: 1 Inhaler; Refill: 0  13. History of DVT (deep vein thrombosis) - dabigatran (PRADAXA) 150 MG CAPS capsule; Take 1 capsule (150 mg total) by mouth 2 (two) times daily.  Dispense: 180 capsule; Refill: 3  - follow up in 3 months for CPE  Clarene Reamer, FNP-BC  [facility] at [facility], [place]  06/17/2018 1:47 PM

## 2018-06-17 NOTE — Patient Instructions (Addendum)
Look for dentist on Medicaid provider list  Please follow up in 3-4 months and schedule a Medicare Annual Wellness Visit  Please add plain mucinex for your cough, drink enough liquid to make urine light yellow, I have sent in Tessalon perles, the prednisone for your hip will likely help with cough as well. I don't think it is a bacterial infection, but if not better in 5-7 days, please follow up  I have sent in a prednisone taper for your hip, can apply heat, please do gentle range of motion exercises several times a day

## 2018-06-21 ENCOUNTER — Ambulatory Visit (INDEPENDENT_AMBULATORY_CARE_PROVIDER_SITE_OTHER)
Admission: RE | Admit: 2018-06-21 | Discharge: 2018-06-21 | Disposition: A | Discharge status: Home / Self Care | Payer: Medicare Other | Source: Ambulatory Visit | Attending: Family Medicine | Admitting: Family Medicine

## 2018-06-21 ENCOUNTER — Ambulatory Visit (INDEPENDENT_AMBULATORY_CARE_PROVIDER_SITE_OTHER): Payer: Medicare Other | Admitting: Family Medicine

## 2018-06-21 ENCOUNTER — Encounter: Payer: Self-pay | Admitting: Family Medicine

## 2018-06-21 VITALS — BP 106/60 | HR 80 | Temp 97.5°F | Ht <= 58 in | Wt 195.0 lb

## 2018-06-21 DIAGNOSIS — R059 Cough, unspecified: Secondary | ICD-10-CM

## 2018-06-21 DIAGNOSIS — R05 Cough: Secondary | ICD-10-CM

## 2018-06-21 DIAGNOSIS — J22 Unspecified acute lower respiratory infection: Secondary | ICD-10-CM | POA: Diagnosis not present

## 2018-06-21 DIAGNOSIS — M25552 Pain in left hip: Secondary | ICD-10-CM

## 2018-06-21 MED ORDER — TRAMADOL HCL 50 MG PO TABS: 50.0000 mg | ORAL_TABLET | Freq: Three times a day (TID) | ORAL | 0 refills | Status: AC | PRN

## 2018-06-21 MED ORDER — IPRATROPIUM-ALBUTEROL 0.5-2.5 (3) MG/3ML IN SOLN
3.0000 mL | Freq: Once | RESPIRATORY_TRACT | Status: AC
  Administered 2018-06-21: 3 mL via RESPIRATORY_TRACT

## 2018-06-21 MED ORDER — DOXYCYCLINE HYCLATE 100 MG PO CAPS: 100.0000 mg | ORAL_CAPSULE | Freq: Two times a day (BID) | ORAL | 0 refills | Status: DC

## 2018-06-21 MED ORDER — CYCLOBENZAPRINE HCL 10 MG PO TABS: 10.0000 mg | ORAL_TABLET | Freq: Two times a day (BID) | ORAL | 1 refills | Status: DC | PRN

## 2018-06-21 NOTE — Progress Notes (Signed)
Subjective:    Patient ID: Victoria [name], female    DOB: October 05, 1960, 57 y.o.   MRN: [id number]  HPI This is a 57 yo female who presents today for follow up of left hip pain. Was seen 4 days ago and given prednisone taper for acute hip pain without worrisome findings on exam. She reports that hip pain has worsening, difficulty lifting her leg. Taking ibuprofen.   Increased cough, low grade fever last 2 days, little improvement on prednisone and using albuterol inhaler every 4 hours.   She brings in a list of medications that has additional medications not on her list from 4 days ago. She reports being on chronic pain medication- hydrocodone acetaminophen 7.5-325 but has not been on for many months. Pain meds were managed through her rheumatologist. She has a rheumatology referral pending scheduling.   Past Medical History:  Diagnosis Date  . Asthma   . Cancer (HCC)    Breast Left  . COPD (chronic obstructive pulmonary disease) ([place])   . Diverticulitis   . IBS (irritable bowel syndrome)   . Lupus ([place])   . Mitral valve disorder   . MS (multiple sclerosis) ([place])    Past Surgical History:  Procedure Laterality Date  . ABDOMINAL HYSTERECTOMY    . APPENDECTOMY    . BREAST SURGERY    . CHOLECYSTECTOMY     Family History  Problem Relation Age of Onset  . COPD Mother   . Stroke Father   . Arthritis Sister   . Diabetes Sister   . Stroke Sister    Social History   Tobacco Use  . Smoking status: Former Research scientist (life sciences)  . Smokeless tobacco: Never Used  Substance Use Topics  . Alcohol use: Yes    Frequency: Never    Comment: Rarely  . Drug use: Never      Review of Systems Per HPI    Objective:   Physical Exam  Constitutional: She appears well-developed and well-nourished. No distress.  HENT:  Head: Normocephalic and atraumatic.  Right Ear: External ear normal.  Left Ear: External ear normal.  Mouth/Throat: Oropharynx is clear and moist.  Eyes: Conjunctivae are normal.    Cardiovascular: Normal rate, regular rhythm and normal heart sounds.  Pulmonary/Chest: Effort normal and breath sounds normal. No stridor. No respiratory distress. She has no wheezes. She has no rales.  Frequent, tight sounding cough.    Skin: Skin is warm and dry. She is not diaphoretic.  Psychiatric: She has a normal mood and affect. Her behavior is normal. Judgment and thought content normal.  Vitals reviewed.     BP 106/60 (BP Location: Right Arm, Patient Position: Sitting, Cuff Size: Large)   Pulse 80   Temp (!) 97.5 F (36.4 C) (Oral)   Ht 4\' 7"  (1.397 m)   Wt 195 lb (88.5 kg)   SpO2 98%   BMI 45.32 kg/m  Wt Readings from Last 3 Encounters:  06/21/18 195 lb (88.5 kg)  06/17/18 194 lb (88 kg)  06/04/18 149 lb 14.6 oz (68 kg)   Duo neb treatment given in office, increased air flow and decreased cough. Slight subjective improvement.    Assessment & Plan:  1. Left hip pain - worsening pain, will check xray - DG HIP UNILAT WITH PELVIS 2-3 VIEWS LEFT; Future - traMADol (ULTRAM) 50 MG tablet; Take 1-2 tablets (50-100 mg total) by mouth every 8 (eight) hours as needed for up to 5 days for severe pain.  Dispense: 30 tablet; Refill: 0 -  cyclobenzaprine (FLEXERIL) 10 MG tablet; Take 1 tablet (10 mg total) by mouth 2 (two) times daily as needed for muscle spasms.  Dispense: 40 tablet; Refill: 1  2. Cough - ipratropium-albuterol (DUONEB) 0.5-2.5 (3) MG/3ML nebulizer solution 3 mL  3. Lower respiratory infection - given COPD history and has been off inhaler, will add antibiotic to cover for bacterial infection, she is already on prednisone taper - doxycycline (VIBRAMYCIN) 100 MG capsule; Take 1 capsule (100 mg total) by mouth 2 (two) times daily.  Dispense: 14 capsule; Refill: 0 - RTC precautions reviewed  Clarene Reamer, FNP-BC  [facility] at [facility]  06/21/2018 5:19 PM

## 2018-06-21 NOTE — Patient Instructions (Addendum)
I will notify you of your xray results  I have sent in an antibiotic for your cough. Also, increase fluids, continue benzonate, mucinex, albuterol inhaler   I have sent some tramadol for your hip pain  Can also take acetaminophen 2 extra strength tablets twice a day and apply heat

## 2018-06-25 ENCOUNTER — Emergency Department (HOSPITAL_COMMUNITY)
Admission: EM | Admit: 2018-06-25 | Discharge: 2018-06-25 | Disposition: A | Discharge status: Home / Self Care | Payer: Medicare Other | Attending: Emergency Medicine | Admitting: Emergency Medicine

## 2018-06-25 ENCOUNTER — Encounter (HOSPITAL_COMMUNITY): Payer: Self-pay | Admitting: *Deleted

## 2018-06-25 ENCOUNTER — Emergency Department (HOSPITAL_COMMUNITY): Payer: Medicare Other

## 2018-06-25 ENCOUNTER — Other Ambulatory Visit: Payer: Self-pay

## 2018-06-25 DIAGNOSIS — R05 Cough: Secondary | ICD-10-CM | POA: Diagnosis not present

## 2018-06-25 DIAGNOSIS — M25552 Pain in left hip: Secondary | ICD-10-CM | POA: Diagnosis present

## 2018-06-25 DIAGNOSIS — M79601 Pain in right arm: Secondary | ICD-10-CM | POA: Insufficient documentation

## 2018-06-25 DIAGNOSIS — Z79899 Other long term (current) drug therapy: Secondary | ICD-10-CM | POA: Insufficient documentation

## 2018-06-25 DIAGNOSIS — M3210 Systemic lupus erythematosus, organ or system involvement unspecified: Secondary | ICD-10-CM | POA: Diagnosis not present

## 2018-06-25 DIAGNOSIS — Z87891 Personal history of nicotine dependence: Secondary | ICD-10-CM | POA: Insufficient documentation

## 2018-06-25 DIAGNOSIS — J449 Chronic obstructive pulmonary disease, unspecified: Secondary | ICD-10-CM | POA: Insufficient documentation

## 2018-06-25 DIAGNOSIS — R059 Cough, unspecified: Secondary | ICD-10-CM

## 2018-06-25 MED ORDER — IPRATROPIUM BROMIDE 0.02 % IN SOLN
0.5000 mg | Freq: Once | RESPIRATORY_TRACT | Status: AC
  Administered 2018-06-25: 0.5 mg via RESPIRATORY_TRACT
  Filled 2018-06-25: qty 2.5

## 2018-06-25 MED ORDER — BENZONATATE 100 MG PO CAPS: 100.0000 mg | ORAL_CAPSULE | Freq: Three times a day (TID) | ORAL | 0 refills | Status: DC | PRN

## 2018-06-25 MED ORDER — OXYCODONE-ACETAMINOPHEN 5-325 MG PO TABS
1.0000 | ORAL_TABLET | Freq: Once | ORAL | Status: AC
  Administered 2018-06-25: 1 via ORAL
  Filled 2018-06-25: qty 1

## 2018-06-25 MED ORDER — ALBUTEROL SULFATE (2.5 MG/3ML) 0.083% IN NEBU
5.0000 mg | INHALATION_SOLUTION | Freq: Once | RESPIRATORY_TRACT | Status: DC
  Filled 2018-06-25: qty 6

## 2018-06-25 NOTE — ED Provider Notes (Signed)
[facility] DEPT Provider Note   CSN: [id number] Arrival date & time: 06/25/18  1110     History   Chief Complaint Chief Complaint  Patient presents with  . Hip Pain  . Arm Pain  . Cough    HPI Victoria [name] is a 57 y.o. female with past medical history of breast cancer, COPD, asthma, diverticulitis, lupus, presenting to the emergency department with multiple complaints.  Patient recently moved to town from [place].  Since that time, she has been seen at [facility] ED, and by new primary care provider twice prior to this visit.  She has no new complaints other than worsening left hip pain since recent visits.  Regarding the hip pain, she states it is on the lateral aspect as well as in the groin.  Pain is worse with any ambulation or movement.  Pain is worsening.  Her primary care provider ordered an outpatient x-ray which showing some mild degenerative changes.  She prescribed her tramadol and muscle relaxer.  Patient states the tramadol did not work and she stopped taking the medication, however the muscle relaxer did provide some relief.  No injuries. Patient complains of right-sided arm pain located in the shoulder and elbow.  He had a negative DVT study of the right arm.  Pain is also worse with movement.  She denies particular injury.  Heat provides some relief. Patient's third complaint is nonproductive persistent cough that has been worsening for 3 weeks.  Take Spiriva and a court for her asthma.  Using albuterol every 4 hours.  She was treated by her primary care provider with steroids, and is currently in the middle of a doxycycline course.  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  She states the [employer] did provide her some relief, stating it quieted the cough.  States the cough is nonproductive.  Does feel minorly like history of COPD  exacerbation.  She does not feel significantly short of breath.  Denies fever, states "I do not run temperatures at all."  She had negative chest x-ray on 06/04/2018.   Last radiation treatment for breast cancer was December 2017. Currently taking pradaxa for RLL DVT.  The history is provided by the patient.    Past Medical History:  Diagnosis Date  . Asthma   . Cancer (HCC)    Breast Left  . COPD (chronic obstructive pulmonary disease) ([place])   . Diverticulitis   . IBS (irritable bowel syndrome)   . Lupus ([place])   . Mitral valve disorder   . MS (multiple sclerosis) ([place])     There are no active problems to display for this patient.   Past Surgical History:  Procedure Laterality Date  . ABDOMINAL HYSTERECTOMY    . APPENDECTOMY    . BREAST SURGERY    . CHOLECYSTECTOMY       OB History   None      Home Medications    Prior to Admission medications   Medication Sig Start Date End Date Taking? Authorizing Provider  albuterol (PROVENTIL HFA;VENTOLIN HFA) 108 (90 Base) MCG/ACT inhaler Inhale 2 puffs into the lungs every 6 (six) hours as needed for wheezing or shortness of breath. 06/17/18  Yes Elby Beck, FNP  budesonide-formoterol (SYMBICORT) 160-4.5 MCG/ACT inhaler Inhale 2 puffs into the lungs 2 (two) times daily. 05/06/18  Yes Paulette Blanch, MD  cyclobenzaprine (  FLEXERIL) 10 MG tablet Take 1 tablet (10 mg total) by mouth 2 (two) times daily as needed for muscle spasms. 06/21/18  Yes Elby Beck, FNP  dabigatran (PRADAXA) 150 MG CAPS capsule Take 1 capsule (150 mg total) by mouth 2 (two) times daily. 06/17/18  Yes Elby Beck, FNP  doxycycline (VIBRAMYCIN) 100 MG capsule Take 1 capsule (100 mg total) by mouth 2 (two) times daily. 06/21/18  Yes Elby Beck, FNP  leflunomide (ARAVA) 20 MG tablet Take 20 mg by mouth daily.   Yes [provider]  letrozole (FEMARA) 2.5 MG tablet Take 1 tablet (2.5 mg total) by mouth daily. 06/17/18  Yes  Elby Beck, FNP  omeprazole (PRILOSEC) 40 MG capsule Take 40 mg by mouth 2 (two) times daily.   Yes [provider]  Tiotropium Bromide Monohydrate (SPIRIVA RESPIMAT) 2.5 MCG/ACT AERS Inhale 2 puffs into the lungs every morning. 06/17/18  Yes Elby Beck, FNP  traMADol (ULTRAM) 50 MG tablet Take 1-2 tablets (50-100 mg total) by mouth every 8 (eight) hours as needed for up to 5 days for severe pain. 06/21/18 06/26/18 Yes Elby Beck, FNP  traZODone (DESYREL) 50 MG tablet Take 3 tablets (150 mg total) by mouth at bedtime. 06/17/18  Yes Elby Beck, FNP  benzonatate (TESSALON) 100 MG capsule Take 1 capsule (100 mg total) by mouth 3 (three) times daily as needed. 06/25/18   Athziry Millican, [place] N, PA-C  predniSONE (DELTASONE) 20 MG tablet Take 3 x 3 days, 2 x 3 days, 1 x 3 days Patient not taking: Reported on 06/25/2018 06/17/18   Elby Beck, FNP    Family History Family History  Problem Relation Age of Onset  . COPD Mother   . Stroke Father   . Arthritis Sister   . Diabetes Sister   . Stroke Sister     Social History Social History   Tobacco Use  . Smoking status: Former Research scientist (life sciences)  . Smokeless tobacco: Never Used  Substance Use Topics  . Alcohol use: Yes    Frequency: Never    Comment: Rarely  . Drug use: Never     Allergies   Codeine; Flagyl [metronidazole]; and Sulfa antibiotics   Review of Systems Review of Systems  Constitutional: Negative for fever.  Respiratory: Positive for cough and wheezing. Negative for shortness of breath.   Cardiovascular: Negative for chest pain.  Musculoskeletal: Positive for arthralgias and myalgias.  Neurological: Negative for numbness.  All other systems reviewed and are negative.    Physical Exam Updated Vital Signs BP (!) 114/57   Pulse 69   Temp (!) 97 F (36.1 C) (Oral)   Resp 16   Ht 4\' 7"  (1.397 m)   Wt 88.5 kg   SpO2 100%   BMI 45.32 kg/m   Physical Exam  Constitutional: She  appears well-developed and well-nourished. No distress.  Morbidly obese.  HENT:  Head: Normocephalic and atraumatic.  Eyes: Conjunctivae are normal.  Neck: Normal range of motion. Neck supple.  Cardiovascular: Normal rate and regular rhythm.  Pulmonary/Chest: Effort normal. No respiratory distress. She has wheezes (Wheezes bilaterally).  Normal work of breathing.  Abdominal: Soft. Bowel sounds are normal. There is no tenderness.  Musculoskeletal:  There is tenderness to the lateral aspect of the left hip, overlying the greater trochanter.  There is no pain in the hip with internal and external rotation. Right shoulder with diffuse tenderness, tenderness to the right trapezius muscle group, redness to the medial  aspect of the right elbow.  When shoulders internally and externally rotated, patient complains of pain in the elbow.  Denies pain in the elbow with rotation and supination of the forearm.  Neurological: She is alert.  Equal strength bilateral upper and lower extremities.  Normal sensation.   Skin: Skin is warm.  Psychiatric: She has a normal mood and affect. Her behavior is normal.  Nursing note and vitals reviewed.    ED Treatments / Results  Labs (all labs ordered are listed, but only abnormal results are displayed) Labs Reviewed - No data to display  EKG None  Radiology Dg Chest 2 View  Result Date: 06/25/2018 CLINICAL DATA:  Cough for 3 weeks. History of COPD, lupus and breast cancer. EXAM: CHEST - 2 VIEW COMPARISON:  Chest radiograph June 04, 2018 FINDINGS: Cardiac silhouette is mildly enlarged and unchanged. Mildly coarsened pulmonary interstitium without pleural effusion or focal consolidation. No pneumothorax. Single-lumen RIGHT chest Port-A-Cath. Surgical clips in the included right abdomen compatible with cholecystectomy. Inferior vena cava filter projects to the RIGHT of L1-2. IMPRESSION: 1. Increasingly coarsened pulmonary interstitium seen with infection  and/or pulmonary edema though given history of breast cancer, recommend follow-up radiograph to verify improvement. 2. Mild cardiomegaly. Electronically Signed   By: Elon Alas M.D.   On: 06/25/2018 14:49   Dg Shoulder Right  Result Date: 06/25/2018 CLINICAL DATA:  Persistent RIGHT shoulder pain. EXAM: RIGHT SHOULDER - 2+ VIEW COMPARISON:  RIGHT upper extremity ultrasound June 04, 2018 FINDINGS: The humeral head is well-formed and located. The subacromial, glenohumeral and acromioclavicular joint spaces are intact. No advanced degenerative change. No destructive bony lesions. Soft tissue planes are non-suspicious. RIGHT chest Port-A-Cath IMPRESSION: Negative. Electronically Signed   By: Elon Alas M.D.   On: 06/25/2018 14:50   Dg Elbow Complete Right  Result Date: 06/25/2018 CLINICAL DATA:  Persistent RIGHT elbow pain. History of lupus and breast cancer. EXAM: RIGHT ELBOW - COMPLETE 3+ VIEW COMPARISON:  None. FINDINGS: There is no evidence of fracture, dislocation, or joint effusion. There is no evidence of arthropathy or other focal bone abnormality. Soft tissues are unremarkable. IMPRESSION: Negative. Electronically Signed   By: Elon Alas M.D.   On: 06/25/2018 14:50   Procedures Procedures (including critical care time)  Medications Ordered in ED Medications  albuterol (PROVENTIL) (2.5 MG/3ML) 0.083% nebulizer solution 5 mg (has no administration in time range)  ipratropium (ATROVENT) nebulizer solution 0.5 mg (0.5 mg Nebulization Given 06/25/18 1548)  oxyCODONE-acetaminophen (PERCOCET/ROXICET) 5-325 MG per tablet 1 tablet (1 tablet Oral Given 06/25/18 1548)    Initial Impression / Assessment and Plan / ED Course  I have reviewed the triage vital signs and the nursing notes.  Pertinent labs & imaging results that were available during my care of the patient were reviewed by me and considered in my medical decision making (see chart for details).  Clinical Course  as of Jun 25 1606  Tue Jun 25, 2018  1547 Discussed patient with Dr. Ashok Cordia after he evaluated patient.  Reviewed imaging from today and visits.  Discussed treatment, including symptomatic management with recently prescribed tramadol, Tylenol.  DuoNeb ordered for the emergency department with pain medications. Offered walker for easy ambulation at home given left hip pain. Discussed recommendation to follow back up with PCP. Patient became upset, requesting to speak with Dr. Ashok Cordia once more.   Consult placed to case management, ordered walker for home use.   [JR]    Clinical Course User Index [JR] Michala Deblanc, [place]  N, PA-C   Patient presenting with multiple complaints.  Evaluated at [facility], and primary care provider 2 times within the past couple of weeks for the same complaints.  Left hip x-ray done on 06/21/2018 showing degenerative changes without acute pathology.  Right arm venous ultrasound on 1029 is negative.  Chest x-ray on 06/04/2018 is negative.  On exam, she has some mild wheezes bilaterally with normal work of breathing.  Oxygen saturation is 100% on room air.  She denies shortness of breath, complains of cough.  Patient with generalized pain and tenderness to the left hip and right arm on exam.  They appear atraumatic.  Imaging ordered of arm and repeat CXR.  Pain treated.  Chest x-ray showing some increasingly coarsened pulmonary interstitial thickening, since previous x-ray. Right arm xray negative. Pt is currently in the middle of a doxycycline antibiotic course. Pt evaluated by Dr. Ashok Cordia. Plan to treat pain symptomatically.  Recommend continue doxycycline course.  DuoNeb in the emergency department.  Strongly encouraged follow-up with PCP.  Patient states "I do not have a PCP", when patient questioned about recent visits with [facility], she states she saw a nurse practitioner and does not consider that a primary care provider.  Discussed with patient that they have medical  doctors in this clinic as well and encouraged she follow up. Discussed symptomatic management for arthritis of right hip and arm pain, including ice, rest, tylenol, and her prescribed medications of tramadol and flexeril. Discussed recommendation to continue course of doxycycline, she is requesting refill for tessalon. Patient states she is having too much trouble getting around her house. Consult placed to case management for a walker, for ambulation until she can follow up with PCP.   Pt is not in respiratory distress, normal work of breathing, oxygenating well. No evidence of life/limb threatening pathology on evaluation today. She is safe for discharge.  Discussed results, findings, treatment and follow up. Patient advised of return precautions. Patient verbalized understanding.  Final Clinical Impressions(s) / ED Diagnoses   Final diagnoses:  Left hip pain  Cough  Right arm pain    ED Discharge Orders         Ordered    benzonatate (TESSALON) 100 MG capsule  3 times daily PRN     06/25/18 1607           Zakaree Mcclenahan, [place] N, PA-C 06/25/18 1611    Lajean Saver, MD 07/01/18 1555

## 2018-06-25 NOTE — Discharge Instructions (Signed)
Continue your antibiotic, Doxycycline, until it is gone.  Continue using your inhalers as prescribed. You can take tessalon every 8 hours as needed for cough. Take the tramadol as prescribed for pain. You can take tylenol with this medication for added pain relief. Rest your hip and arm. Apply ice to areas of pain for 20 minutes at a time. Schedule an appointment to follow up with your primary care provider regarding your visit today.

## 2018-06-25 NOTE — ED Notes (Signed)
Patient assisted on bedpan, patient urinated and had bm.

## 2018-06-25 NOTE — Care Management Note (Signed)
Case Management Note  CM consulted for DME rolling walker.  Contacted Santiago Glad with [facility] who will bring walker to the pt prior to transition home.  Quentin Cornwall, [place] aware.  No further CM needs noted at this time.  Shatana Saxton, Benjaman Lobe, RN 06/25/2018, 3:48 PM

## 2018-06-25 NOTE — ED Triage Notes (Signed)
Pt complains of pain in left hip. Pt was seen at PCP for same a few days ago but states pain is not better. Pt also complains of pain in her right arm. Pt also complains of cough for 3 weeks.

## 2018-06-25 NOTE — ED Notes (Signed)
Patient incontinent of urine and stool.

## 2018-06-26 ENCOUNTER — Encounter: Payer: Self-pay | Admitting: Family Medicine

## 2018-06-26 ENCOUNTER — Telehealth: Payer: Self-pay | Admitting: *Deleted

## 2018-06-26 ENCOUNTER — Encounter: Payer: Self-pay | Admitting: Oncology

## 2018-06-26 ENCOUNTER — Inpatient Hospital Stay: Payer: Medicare Other | Attending: Oncology | Admitting: Oncology

## 2018-06-26 ENCOUNTER — Other Ambulatory Visit: Payer: Self-pay | Admitting: Family Medicine

## 2018-06-26 VITALS — BP 114/60 | HR 72 | Temp 97.4°F | Resp 16 | Ht <= 58 in | Wt 198.2 lb

## 2018-06-26 DIAGNOSIS — Z7901 Long term (current) use of anticoagulants: Secondary | ICD-10-CM | POA: Insufficient documentation

## 2018-06-26 DIAGNOSIS — Z79899 Other long term (current) drug therapy: Secondary | ICD-10-CM | POA: Diagnosis not present

## 2018-06-26 DIAGNOSIS — Z9221 Personal history of antineoplastic chemotherapy: Secondary | ICD-10-CM | POA: Insufficient documentation

## 2018-06-26 DIAGNOSIS — R1032 Left lower quadrant pain: Secondary | ICD-10-CM | POA: Diagnosis not present

## 2018-06-26 DIAGNOSIS — Z86711 Personal history of pulmonary embolism: Secondary | ICD-10-CM | POA: Diagnosis not present

## 2018-06-26 DIAGNOSIS — M329 Systemic lupus erythematosus, unspecified: Secondary | ICD-10-CM | POA: Insufficient documentation

## 2018-06-26 DIAGNOSIS — Z79811 Long term (current) use of aromatase inhibitors: Secondary | ICD-10-CM | POA: Insufficient documentation

## 2018-06-26 DIAGNOSIS — M1990 Unspecified osteoarthritis, unspecified site: Secondary | ICD-10-CM

## 2018-06-26 DIAGNOSIS — C50912 Malignant neoplasm of unspecified site of left female breast: Secondary | ICD-10-CM | POA: Diagnosis not present

## 2018-06-26 DIAGNOSIS — Z86718 Personal history of other venous thrombosis and embolism: Secondary | ICD-10-CM

## 2018-06-26 DIAGNOSIS — Z853 Personal history of malignant neoplasm of breast: Secondary | ICD-10-CM

## 2018-06-26 DIAGNOSIS — M069 Rheumatoid arthritis, unspecified: Secondary | ICD-10-CM | POA: Insufficient documentation

## 2018-06-26 DIAGNOSIS — Z87891 Personal history of nicotine dependence: Secondary | ICD-10-CM | POA: Diagnosis not present

## 2018-06-26 DIAGNOSIS — R9389 Abnormal findings on diagnostic imaging of other specified body structures: Secondary | ICD-10-CM

## 2018-06-26 DIAGNOSIS — Z923 Personal history of irradiation: Secondary | ICD-10-CM | POA: Insufficient documentation

## 2018-06-26 NOTE — Telephone Encounter (Signed)
Per 06/26/18 [id number] Mammogram   Per Crystal from [facility] stated to patient will have to come by there and sign a med. release form before her Mammogram/ [place] can be scheduled due to her last mammo being done in [place].  Patient was made aware of what needed to be done. Patient stated that she will go by there today.

## 2018-06-26 NOTE — Progress Notes (Signed)
Patient here today as a new patient with history of breast cancer.

## 2018-06-27 ENCOUNTER — Telehealth: Payer: Self-pay | Admitting: Family Medicine

## 2018-06-27 NOTE — Telephone Encounter (Signed)
Reviewed patient's message from yesterday 06/26/18 regarding appt.  Tor Netters, NP ordered chest xray to be repeated in 2 weeks NOT lab work.  Patient made aware and appt made for 07/10/18 at 11am.  Patient does not need an appointment on the schedule per Lauren, she can just walk in.  Orders have been placed by provider.

## 2018-06-27 NOTE — Telephone Encounter (Signed)
Noted  

## 2018-06-27 NOTE — Telephone Encounter (Signed)
Note patient has a porta cath and is unable to have blood drawn from arm veins.  If she will need any lab work moving forward we will need to coordinate efforts with the cancer center.  Spoke with Hassan Rowan at the cancer center and they ask that we contact them if anything is needed in regards to venipuncture moving forward.  [facility] does not draw from a port a cath either.   FYI to Tor Netters, NP

## 2018-06-27 NOTE — Telephone Encounter (Signed)
Pt has a port and need to have labs drawn in 2wks, pt need to know where can she go. Please advise pt

## 2018-06-28 NOTE — Progress Notes (Signed)
Hematology/Oncology Consult note [facility] Telephone:([phone] [phone]   Patient Care Team: Elby Beck, FNP as PCP - General (Nurse Practitioner)  REFERRING PROVIDER: Elby Beck, FNP CHIEF COMPLAINTS/REASON FOR VISIT:  Evaluation of history of breast cancer  HISTORY OF PRESENTING ILLNESS:  Victoria [name] is a  57 y.o.  female with PMH listed below who was referred to me for evaluation of history of breast cancer Patient moved from [place]. Reports she was diagnosed with left breast cancer 2 years ago, hormone receptor positive.  Status post lumpectomy and radiation. Chemotherapy.  Patient is on letrozole since December 2017.  Tolerates well.  His previous oncologist [facility] specialist in [place], [place].  She also has history of rheumatoid arthritis/lupus follows up with Dr. Cydney Ok.  Recalls that she has had DEXA scan done about 2 years ago. Reports mammogram was done about 1 year ago.  Also report having left lower extremity blood clot 3 years ago, started on Eliquis for a month and developed pulmonary embolism.  Switch to Pradaxa.  She also had IVC filter.  After she moved to [place], she was temporarily off Pradaxa.  Currently back on anticoagulation. She presented to emergency room on 06/25/2018 for evaluation of right arm pain, complaining about developing knots right upper extremity.   Review of Systems  Constitutional: Negative for appetite change, chills, fatigue and fever.  HENT:   Negative for hearing loss and voice change.   Eyes: Negative for eye problems.  Respiratory: Negative for chest tightness and cough.   Cardiovascular: Negative for chest pain.  Gastrointestinal: Negative for abdominal distention, abdominal pain and blood in stool.  Endocrine: Negative for hot flashes.  Genitourinary: Negative for difficulty urinating and frequency.   Musculoskeletal: Positive for  arthralgias, back pain and myalgias.  Skin: Negative for itching and rash.  Neurological: Negative for extremity weakness.  Hematological: Negative for adenopathy.  Psychiatric/Behavioral: Negative for confusion.    MEDICAL HISTORY:  Past Medical History:  Diagnosis Date  . Asthma   . Cancer (HCC)    Breast Left, DCIS   . COPD (chronic obstructive pulmonary disease) ([place])   . Diverticulitis   . Fibromyalgia   . GERD (gastroesophageal reflux disease)   . Hearing impaired person, left   . IBS (irritable bowel syndrome)   . Lupus ([place])   . Mitral valve disorder   . MS (multiple sclerosis) ([place])   . Neuromuscular disorder ([place])   . Osteoarthritis   . Rheumatoid arthritis ([place])     SURGICAL HISTORY: Past Surgical History:  Procedure Laterality Date  . ABDOMINAL HYSTERECTOMY    . APPENDECTOMY    . BREAST SURGERY    . CHOLECYSTECTOMY    . PATENT DUCTUS ARTERIOUS REPAIR    . RECONSTRUCTION TENDON PULLEY HAND Left   . TOTAL HIP ARTHROPLASTY Right     SOCIAL HISTORY: Social History   Socioeconomic History  . Marital status: Widowed    Spouse name: Not on file  . Number of children: Not on file  . Years of education: Not on file  . Highest education level: Not on file  Occupational History  . Not on file  Social Needs  . Financial resource strain: Not on file  . Food insecurity:    Worry: Not on file    Inability: Not on file  . Transportation needs:    Medical: Not on file    Non-medical: Not on file  Tobacco Use  .  Smoking status: Former Smoker    Packs/day: 2.00    Years: 15.00    Pack years: 30.00    Types: Cigarettes    Last attempt to quit: 06/27/1999    Years since quitting: 19.0  . Smokeless tobacco: Never Used  Substance and Sexual Activity  . Alcohol use: Yes    Frequency: Never    Comment: Rarely - approx 6 beers once a month  . Drug use: Never  . Sexual activity: Not Currently  Lifestyle  . Physical activity:    Days per week: Not on file     Minutes per session: Not on file  . Stress: Not on file  Relationships  . Social connections:    Talks on phone: Not on file    Gets together: Not on file    Attends religious service: Not on file    Active member of club or organization: Not on file    Attends meetings of clubs or organizations: Not on file    Relationship status: Not on file  . Intimate partner violence:    Fear of current or ex partner: Not on file    Emotionally abused: Not on file    Physically abused: Not on file    Forced sexual activity: Not on file  Other Topics Concern  . Not on file  Social History Narrative  . Not on file    FAMILY HISTORY: Family History  Problem Relation Age of Onset  . COPD Mother   . Stroke Father   . Arthritis Brother   . Diabetes Brother   . Stroke Brother     ALLERGIES:  is allergic to codeine; flagyl [metronidazole]; and sulfa antibiotics.  MEDICATIONS:  Current Outpatient Medications  Medication Sig Dispense Refill  . albuterol (PROVENTIL HFA;VENTOLIN HFA) 108 (90 Base) MCG/ACT inhaler Inhale 2 puffs into the lungs every 6 (six) hours as needed for wheezing or shortness of breath. 1 Inhaler 2  . benzonatate (TESSALON) 100 MG capsule Take 1 capsule (100 mg total) by mouth 3 (three) times daily as needed. 30 capsule 0  . budesonide-formoterol (SYMBICORT) 160-4.5 MCG/ACT inhaler Inhale 2 puffs into the lungs 2 (two) times daily. 1 Inhaler 12  . cyclobenzaprine (FLEXERIL) 10 MG tablet Take 1 tablet (10 mg total) by mouth 2 (two) times daily as needed for muscle spasms. 40 tablet 1  . dabigatran (PRADAXA) 150 MG CAPS capsule Take 1 capsule (150 mg total) by mouth 2 (two) times daily. 180 capsule 3  . doxycycline (VIBRAMYCIN) 100 MG capsule Take 1 capsule (100 mg total) by mouth 2 (two) times daily. 14 capsule 0  . hydroxychloroquine (PLAQUENIL) 200 MG tablet Take 200 mg by mouth daily.    Marland Kitchen leflunomide (ARAVA) 20 MG tablet Take 20 mg by mouth daily.    Marland Kitchen letrozole (FEMARA)  2.5 MG tablet Take 1 tablet (2.5 mg total) by mouth daily. 90 tablet 3  . Tiotropium Bromide Monohydrate (SPIRIVA RESPIMAT) 2.5 MCG/ACT AERS Inhale 2 puffs into the lungs every morning. 1 Inhaler 11  . traZODone (DESYREL) 50 MG tablet Take 3 tablets (150 mg total) by mouth at bedtime. 180 tablet 1  . omeprazole (PRILOSEC) 40 MG capsule Take 40 mg by mouth 2 (two) times daily.     No current facility-administered medications for this visit.      PHYSICAL EXAMINATION: ECOG PERFORMANCE STATUS: 2 - Symptomatic, <50% confined to bed Vitals:   06/26/18 1005  BP: 114/60  Pulse: 72  Resp: 16  Temp: (!) 97.4 F (36.3 C)   Filed Weights   06/26/18 1005  Weight: 198 lb 3 oz (89.9 kg)    Physical Exam  Constitutional: She is oriented to person, place, and time. No distress.  obese  HENT:  Head: Normocephalic and atraumatic.  Mouth/Throat: Oropharynx is clear and moist.  Eyes: Pupils are equal, round, and reactive to light. EOM are normal. No scleral icterus.  Neck: Normal range of motion. Neck supple.  Cardiovascular: Normal rate, regular rhythm and normal heart sounds.  Pulmonary/Chest: Effort normal. No respiratory distress. She has no wheezes.  Abdominal: Soft. Bowel sounds are normal. She exhibits no distension and no mass. There is no tenderness.  Musculoskeletal: Normal range of motion. She exhibits no edema.  Left groin tenderness soft tissue nodule.  Neurological: She is alert and oriented to person, place, and time. No cranial nerve deficit. Coordination normal.  Skin: Skin is warm and dry. No rash noted. No erythema.  Psychiatric: She has a normal mood and affect. Her behavior is normal. Thought content normal.     LABORATORY DATA:  I have reviewed the data as listed Lab Results  Component Value Date   WBC 4.5 06/04/2018   HGB 11.7 (L) 06/04/2018   HCT 37.1 06/04/2018   MCV 91.8 06/04/2018   PLT 138 (L) 06/04/2018   Recent Labs    11/16/17 05/06/18 0051  06/04/18 1646  NA 142 140 140  K 4.1 3.8 3.8  CL  --  108 106  CO2  --  26 23  GLUCOSE  --  107* 88  BUN 14 21* 18  CREATININE 0.6 0.56 0.55  CALCIUM  --  9.1 9.2  GFRNONAA  --  >60 >60  GFRAA  --  >60 >60  PROT  --  6.4* 6.2*  ALBUMIN  --  3.9 3.7  AST 17 24 21   ALT 19 26 24   ALKPHOS 57 69 62  BILITOT  --  0.4 0.4   Iron/TIBC/Ferritin/ %Sat No results found for: IRON, TIBC, FERRITIN, IRONPCTSAT   RADIOGRAPHIC STUDIES: I have personally reviewed the radiological images as listed and agreed with the findings in the report. US venous upper right extremity 06/04/2018  No evidence of DVT within the right upper extremity   ASSESSMENT & PLAN:  1. History of left breast cancer   2. Groin pain, left   3. Arthritis   4. History of DVT (deep vein thrombosis)    #Will need to obtain patient's medical records from prior oncologist and rheumatologist.. Proceed with annual mammogram diagnostic bilateral  #History of DVT and PE, need to review patient's medical records. For now continue Pradaxa.  #Left groin pain, questionable soft tissue nodule.  Obtain ultrasound for further evaluation. Advised patient use Tylenol as needed. #Chronic autoimmune disorders/rheumatoid arthritis/lupus: Referred to [facility] evaluation.  Orders Placed This Encounter  Procedures  . [place] LT LOWER EXTREM LTD SOFT TISSUE NON VASCULAR    Standing Status:   Future    Standing Expiration Date:   08/27/2019    Order Specific Question:   Reason for Exam (SYMPTOM  OR DIAGNOSIS REQUIRED)    Answer:   soft tissue mass at left groin with pain    Order Specific Question:   Preferred imaging location?    Answer:   [facility]  . MM DIAG BREAST TOMO BILATERAL    Standing Status:   Future    Standing Expiration Date:   06/27/2019    Order Specific Question:  Reason for Exam (SYMPTOM  OR DIAGNOSIS REQUIRED)    Answer:   history of left breast cancer    Order Specific Question:   Is the patient pregnant?     Answer:   No    Order Specific Question:   Preferred imaging location?    Answer:   [facility]  . US Breast Limited Uni Left Inc [place]    Standing Status:   Future    Standing Expiration Date:   08/27/2019    Order Specific Question:   Reason for Exam (SYMPTOM  OR DIAGNOSIS REQUIRED)    Answer:   history of left breast cancer    Order Specific Question:   Preferred imaging location?    Answer:   [facility]  . US Breast Limited Uni Right Inc [place]    Standing Status:   Future    Standing Expiration Date:   08/27/2019    Order Specific Question:   Reason for Exam (SYMPTOM  OR DIAGNOSIS REQUIRED)    Answer:   history of left breast cancer    Order Specific Question:   Preferred imaging location?    Answer:   [facility]  . Ambulatory referral to Rheumatology    Referral Priority:   Routine    Referral Type:   Consultation    Referral Reason:   Specialty Services Required    Referred to Provider:   Marlowe Sax, MD    Requested Specialty:   Rheumatology    Number of Visits Requested:   1    All questions were answered. The patient knows to call the clinic with any problems questions or concerns.  Return of visit: After the mammogram Thank you for this kind referral and the opportunity to participate in the care of this patient. A copy of today's note is routed to referring provider  Total face to face encounter time for this patient visit was 45 min. >50% of the time was  spent in counseling and coordination of care.    Earlie Server, MD, PhD Hematology Oncology [facility] Pager- [phone] 06/28/2018

## 2018-06-29 ENCOUNTER — Other Ambulatory Visit: Payer: Self-pay | Admitting: Family Medicine

## 2018-06-29 DIAGNOSIS — M25552 Pain in left hip: Secondary | ICD-10-CM

## 2018-07-02 ENCOUNTER — Ambulatory Visit
Admission: RE | Admit: 2018-07-02 | Discharge: 2018-07-02 | Disposition: A | Discharge status: Home / Self Care | Payer: Medicare Other | Source: Ambulatory Visit | Attending: Oncology | Admitting: Oncology

## 2018-07-02 DIAGNOSIS — R2242 Localized swelling, mass and lump, left lower limb: Secondary | ICD-10-CM | POA: Insufficient documentation

## 2018-07-02 DIAGNOSIS — R1032 Left lower quadrant pain: Secondary | ICD-10-CM | POA: Insufficient documentation

## 2018-07-08 ENCOUNTER — Telehealth: Payer: Self-pay | Admitting: *Deleted

## 2018-07-08 NOTE — Telephone Encounter (Signed)
Patient asking for results, please return call [phone]   CLINICAL DATA:  Left groin soft tissue mass with pain.  EXAM: ULTRASOUND LEFT LOWER EXTREMITY LIMITED  TECHNIQUE: Ultrasound examination of the lower extremity soft tissues was performed in the area of clinical concern.  COMPARISON:  None.  FINDINGS: Targeted ultrasound of the medial left upper thigh/groin region in the area of palpable abnormality demonstrates a 1.8 x 1.7 x 0.9 cm lymph node with normal morphology. Slightly more laterally in the left upper thigh is an oval area with slightly increased echogenicity relative to adjacent fat which measures 2.1 x 1.0 x 1.7 cm.  IMPRESSION: 1. Benign-appearing lymph node in the medial left upper thigh/groin region. 2. 2.1 cm subtly echogenic mass more laterally in the left upper thigh suggestive of a lipoma.   Electronically Signed   By: Logan Bores M.D.   On: 07/03/2018 08:26

## 2018-07-09 ENCOUNTER — Encounter: Payer: Self-pay | Admitting: Family Medicine

## 2018-07-09 ENCOUNTER — Ambulatory Visit (INDEPENDENT_AMBULATORY_CARE_PROVIDER_SITE_OTHER)
Admission: RE | Admit: 2018-07-09 | Discharge: 2018-07-09 | Disposition: A | Discharge status: Home / Self Care | Payer: Medicare Other | Source: Ambulatory Visit | Attending: Family Medicine | Admitting: Family Medicine

## 2018-07-09 DIAGNOSIS — R9389 Abnormal findings on diagnostic imaging of other specified body structures: Secondary | ICD-10-CM

## 2018-07-10 ENCOUNTER — Encounter: Payer: Self-pay | Admitting: Family Medicine

## 2018-07-11 ENCOUNTER — Other Ambulatory Visit: Payer: Medicare Other

## 2018-07-12 ENCOUNTER — Encounter: Payer: Self-pay | Admitting: Family Medicine

## 2018-07-12 ENCOUNTER — Ambulatory Visit (INDEPENDENT_AMBULATORY_CARE_PROVIDER_SITE_OTHER): Payer: Medicare Other | Admitting: Family Medicine

## 2018-07-12 VITALS — BP 110/78 | HR 97 | Temp 98.1°F | Ht <= 58 in | Wt 193.0 lb

## 2018-07-12 DIAGNOSIS — K219 Gastro-esophageal reflux disease without esophagitis: Secondary | ICD-10-CM | POA: Diagnosis not present

## 2018-07-12 DIAGNOSIS — R0781 Pleurodynia: Secondary | ICD-10-CM

## 2018-07-12 MED ORDER — OMEPRAZOLE 40 MG PO CPDR: 40.0000 mg | DELAYED_RELEASE_CAPSULE | Freq: Two times a day (BID) | ORAL | 1 refills | Status: DC

## 2018-07-12 MED ORDER — HYDROCODONE-ACETAMINOPHEN 7.5-325 MG PO TABS: 1.0000 | ORAL_TABLET | Freq: Three times a day (TID) | ORAL | 0 refills | Status: DC | PRN

## 2018-07-12 NOTE — Progress Notes (Signed)
Subjective:    Patient ID: Victoria [name], female    DOB: 06-07-1961, 57 y.o.   MRN: [id number]  HPI This is a 57 yo female who presents today with continued left sided rib pain. Started 2 weeks ago. No known injury. Has had cough that started after pain started. Cough resolved. She reports that she "can take pain around a 4, this is a 10." She has multiple medical problems and has been getting established with various specialists over the last month. She has an appointment with rheumatology next week. She tells me that his is the longest she has been without her hydrocodone-acetaminophen (since June) that she has taken for her chronic pain. According to the patient, she was taking 7.5-325 mg tablets and would get 60 which would last her for about 2 months. According to the patient, she was given pain medication by her rheumatologist and pcp.   Past Medical History:  Diagnosis Date  . Asthma   . Cancer (HCC)    Breast Left, DCIS   . COPD (chronic obstructive pulmonary disease) ([place])   . Diverticulitis   . Fibromyalgia   . GERD (gastroesophageal reflux disease)   . Hearing impaired person, left   . IBS (irritable bowel syndrome)   . Lupus ([place])   . Mitral valve disorder   . MS (multiple sclerosis) ([place])   . Neuromuscular disorder ([place])   . Osteoarthritis   . Rheumatoid arthritis [facility])    Past Surgical History:  Procedure Laterality Date  . ABDOMINAL HYSTERECTOMY    . APPENDECTOMY    . BREAST SURGERY    . CHOLECYSTECTOMY    . PATENT DUCTUS ARTERIOUS REPAIR    . RECONSTRUCTION TENDON PULLEY HAND Left   . TOTAL HIP ARTHROPLASTY Right    Family History  Problem Relation Age of Onset  . COPD Mother   . Stroke Father   . Arthritis Brother   . Diabetes Brother   . Stroke Brother    Social History   Tobacco Use  . Smoking status: Former Smoker    Packs/day: 2.00    Years: 15.00    Pack years: 30.00    Types: Cigarettes    Last attempt to quit: 06/27/1999    Years since  quitting: 19.0  . Smokeless tobacco: Never Used  Substance Use Topics  . Alcohol use: Yes    Frequency: Never    Comment: Rarely - approx 6 beers once a month  . Drug use: Never      Review of Systems Per HPI    Objective:   Physical Exam  Constitutional: She is oriented to person, place, and time. She appears well-developed and well-nourished. No distress.  HENT:  Head: Normocephalic and atraumatic.  Eyes: Conjunctivae are normal.  Cardiovascular: Normal rate.  Pulmonary/Chest: Effort normal. She exhibits bony tenderness.    Neurological: She is alert and oriented to person, place, and time.  Skin: Skin is warm and dry. She is not diaphoretic.  Psychiatric: She has a normal mood and affect. Her behavior is normal. Judgment and thought content normal.  Vitals reviewed.     BP 110/78 (BP Location: Right Arm, Patient Position: Sitting, Cuff Size: Large)   Pulse 97   Temp 98.1 F (36.7 C) (Oral)   Ht 4\' 7"  (1.397 m)   Wt 193 lb (87.5 kg)   SpO2 98%   BMI 44.86 kg/m  Wt Readings from Last 3 Encounters:  07/12/18 193 lb (87.5 kg)  06/26/18 198 lb 3  oz (89.9 kg)  06/25/18 195 lb (88.5 kg)       Assessment & Plan:  1. Gastroesophageal reflux disease, esophagitis presence not specified - patient requests refill of omeprazole - omeprazole (PRILOSEC) 40 MG capsule; Take 1 capsule (40 mg total) by mouth 2 (two) times daily.  Dispense: 90 capsule; Refill: 1  2. Rib pain on left side - limited in options for pain medication as patient not able to take NSAIDs, she states acetaminophen and tramadol don't work - I agreed to given her a small amount of hydrocodone-acetaminophen. I told her that I did not think this was a solution to her chronic pain but would use temporarily for acute rib pain.  - HYDROcodone-acetaminophen (NORCO) 7.5-325 MG tablet; Take 1 tablet by mouth every 8 (eight) hours as needed for moderate pain.  Dispense: 15 tablet; Refill: 0   Clarene Reamer,  FNP-BC  [facility]  07/12/2018 5:03 PM

## 2018-07-12 NOTE — Patient Instructions (Signed)
Good to see you today   I hope you get relief from your pain

## 2018-07-16 DIAGNOSIS — M328 Other forms of systemic lupus erythematosus: Secondary | ICD-10-CM | POA: Insufficient documentation

## 2018-07-16 DIAGNOSIS — M1380 Other specified arthritis, unspecified site: Secondary | ICD-10-CM | POA: Insufficient documentation

## 2018-07-16 DIAGNOSIS — M797 Fibromyalgia: Secondary | ICD-10-CM | POA: Insufficient documentation

## 2018-07-16 DIAGNOSIS — Z79899 Other long term (current) drug therapy: Secondary | ICD-10-CM | POA: Insufficient documentation

## 2018-07-16 DIAGNOSIS — R6889 Other general symptoms and signs: Secondary | ICD-10-CM | POA: Insufficient documentation

## 2018-07-16 DIAGNOSIS — M818 Other osteoporosis without current pathological fracture: Secondary | ICD-10-CM | POA: Insufficient documentation

## 2018-07-19 ENCOUNTER — Encounter: Payer: Self-pay | Admitting: Oncology

## 2018-07-19 ENCOUNTER — Other Ambulatory Visit: Payer: Self-pay

## 2018-07-19 ENCOUNTER — Inpatient Hospital Stay: Payer: Medicare Other | Attending: Oncology | Admitting: Oncology

## 2018-07-19 VITALS — BP 144/87 | HR 85 | Temp 97.3°F | Wt 193.0 lb

## 2018-07-19 DIAGNOSIS — Z79811 Long term (current) use of aromatase inhibitors: Secondary | ICD-10-CM | POA: Insufficient documentation

## 2018-07-19 DIAGNOSIS — Z86711 Personal history of pulmonary embolism: Secondary | ICD-10-CM | POA: Diagnosis not present

## 2018-07-19 DIAGNOSIS — D0512 Intraductal carcinoma in situ of left breast: Secondary | ICD-10-CM | POA: Diagnosis present

## 2018-07-19 DIAGNOSIS — Z86000 Personal history of in-situ neoplasm of breast: Secondary | ICD-10-CM

## 2018-07-19 DIAGNOSIS — D1724 Benign lipomatous neoplasm of skin and subcutaneous tissue of left leg: Secondary | ICD-10-CM | POA: Diagnosis not present

## 2018-07-19 DIAGNOSIS — Z86718 Personal history of other venous thrombosis and embolism: Secondary | ICD-10-CM | POA: Diagnosis not present

## 2018-07-19 DIAGNOSIS — Z87891 Personal history of nicotine dependence: Secondary | ICD-10-CM | POA: Insufficient documentation

## 2018-07-19 DIAGNOSIS — Z170 Estrogen receptor positive status [ER+]: Secondary | ICD-10-CM | POA: Diagnosis not present

## 2018-07-19 DIAGNOSIS — Z923 Personal history of irradiation: Secondary | ICD-10-CM | POA: Diagnosis not present

## 2018-07-19 DIAGNOSIS — Z7901 Long term (current) use of anticoagulants: Secondary | ICD-10-CM | POA: Insufficient documentation

## 2018-07-19 DIAGNOSIS — M069 Rheumatoid arthritis, unspecified: Secondary | ICD-10-CM

## 2018-07-19 DIAGNOSIS — J449 Chronic obstructive pulmonary disease, unspecified: Secondary | ICD-10-CM | POA: Diagnosis not present

## 2018-07-19 DIAGNOSIS — R0781 Pleurodynia: Secondary | ICD-10-CM | POA: Insufficient documentation

## 2018-07-19 NOTE — Progress Notes (Signed)
Patient here for follow up. Pt complains of "a lot of pain" to left ribcage. She thinks it might be fractured. "it hurts more when I breath in, cough or sneeze."

## 2018-07-20 DIAGNOSIS — Z79811 Long term (current) use of aromatase inhibitors: Secondary | ICD-10-CM | POA: Insufficient documentation

## 2018-07-20 DIAGNOSIS — Z86000 Personal history of in-situ neoplasm of breast: Secondary | ICD-10-CM | POA: Insufficient documentation

## 2018-07-20 NOTE — Progress Notes (Signed)
Hematology/Oncology Consult note [facility] [phone] [phone]   Patient Care Team: Elby Beck, FNP as PCP - General (Nurse Practitioner)  REFERRING PROVIDER: Elby Beck, FNP CHIEF COMPLAINTS/REASON FOR VISIT:  Evaluation of history of breast cancer  HISTORY OF PRESENTING ILLNESS:  Victoria [name] is a  57 y.o.  female with PMH listed below who was referred to me for evaluation of history of breast cancer Patient moved from [place]. Reports she was diagnosed with left breast cancer 2 years ago, hormone receptor positive.  Status post lumpectomy and radiation. Chemotherapy.  Patient is on letrozole since December 2017.  Tolerates well.  His previous oncologist [facility] in [place], [place].  She also has history of rheumatoid arthritis/lupus follows up with Dr. Cydney Ok.  Recalls that she has had DEXA scan done about 2 years ago. Reports mammogram was done about 1 year ago.  Also report having left lower extremity blood clot 3 years ago, started on Eliquis for a month and developed pulmonary embolism.  Switch to Pradaxa.  She also had IVC filter.  After she moved to [place], she was temporarily off Pradaxa.  Currently back on anticoagulation. She presented to emergency room on 06/25/2018 for evaluation of right arm pain, complaining about developing knots right upper extremity.   INTERVAL HISTORY Mishka Stegemann is a 57 y.o. female who has above history reviewed by me today presents for follow up visit for management of history of DCIS.  We obtained previous oncology history from [place]. Medical records were reviewed by me.  Patient was diagnosed with left breast DCIS in 2017, s/p lumpectomy and radiation.  Initially was started on Arimidex however cannot tolerate and was switched to Letrozole.  Also history of unprovoked VTE, recurrent, on Pradaxa.    Tolerates well. Denies hematochezia, hematuria, hematemesis, epistaxis, black tarry stool or easy bruising.   # She also complains left groin soft tissue which is painful. [place] left thigh showed most likely lipoma.  She has also established care with Rheumatology.  # Left rib cage pain started 2 weeks ago, no history of Trauma. Denies any SOB. On chronic anticoagulation. She has had coughing spells. Pain is worsened taking deep breathing, 5/10, she told her pcp and CXR was obtained on 07/09/2018 which showed interval resolution of subsegmental atelectasis or interstintial edema. Mild chronic bronchitis changes. No pneumonia or pulmonary edema.    Review of Systems  Constitutional: Positive for fatigue. Negative for appetite change, chills and fever.  HENT:   Negative for hearing loss and voice change.   Eyes: Negative for eye problems.  Respiratory: Negative for chest tightness and cough.   Cardiovascular: Negative for chest pain.  Gastrointestinal: Negative for abdominal distention, abdominal pain and blood in stool.  Endocrine: Negative for hot flashes.  Genitourinary: Negative for difficulty urinating and frequency.   Musculoskeletal: Positive for arthralgias, back pain and myalgias.       Left rib cage pain.   Skin: Negative for itching and rash.  Neurological: Negative for extremity weakness.  Hematological: Negative for adenopathy.  Psychiatric/Behavioral: Negative for confusion.    MEDICAL HISTORY:  Past Medical History:  Diagnosis Date  . Asthma   . Cancer (HCC)    Breast Left, DCIS   . COPD (chronic obstructive pulmonary disease) ([place])   . Diverticulitis   . Fibromyalgia   . GERD (gastroesophageal reflux disease)   . Hearing impaired person, left   . IBS (  irritable bowel syndrome)   . Lupus ([place])   . Mitral valve disorder   . MS (multiple sclerosis) ([place])   . Neuromuscular disorder ([place])   . Osteoarthritis   . Rheumatoid arthritis ([place])     SURGICAL HISTORY: Past  Surgical History:  Procedure Laterality Date  . ABDOMINAL HYSTERECTOMY    . APPENDECTOMY    . BREAST SURGERY    . CHOLECYSTECTOMY    . PATENT DUCTUS ARTERIOUS REPAIR    . RECONSTRUCTION TENDON PULLEY HAND Left   . TOTAL HIP ARTHROPLASTY Right     SOCIAL HISTORY: Social History   Socioeconomic History  . Marital status: Widowed    Spouse name: Not on file  . Number of children: Not on file  . Years of education: Not on file  . Highest education level: Not on file  Occupational History  . Not on file  Social Needs  . Financial resource strain: Not on file  . Food insecurity:    Worry: Not on file    Inability: Not on file  . Transportation needs:    Medical: Not on file    Non-medical: Not on file  Tobacco Use  . Smoking status: Former Smoker    Packs/day: 2.00    Years: 15.00    Pack years: 30.00    Types: Cigarettes    Last attempt to quit: 06/27/1999    Years since quitting: 19.0  . Smokeless tobacco: Never Used  Substance and Sexual Activity  . Alcohol use: Yes    Frequency: Never    Comment: Rarely - approx 6 beers once a month  . Drug use: Never  . Sexual activity: Not Currently  Lifestyle  . Physical activity:    Days per week: Not on file    Minutes per session: Not on file  . Stress: Not on file  Relationships  . Social connections:    Talks on phone: Not on file    Gets together: Not on file    Attends religious service: Not on file    Active member of club or organization: Not on file    Attends meetings of clubs or organizations: Not on file    Relationship status: Not on file  . Intimate partner violence:    Fear of current or ex partner: Not on file    Emotionally abused: Not on file    Physically abused: Not on file    Forced sexual activity: Not on file  Other Topics Concern  . Not on file  Social History Narrative  . Not on file    FAMILY HISTORY: Family History  Problem Relation Age of Onset  . COPD Mother   . Stroke Father   .  Arthritis Brother   . Diabetes Brother   . Stroke Brother     ALLERGIES:  is allergic to codeine; flagyl [metronidazole]; and sulfa antibiotics.  MEDICATIONS:  Current Outpatient Medications  Medication Sig Dispense Refill  . albuterol (PROVENTIL HFA;VENTOLIN HFA) 108 (90 Base) MCG/ACT inhaler Inhale 2 puffs into the lungs every 6 (six) hours as needed for wheezing or shortness of breath. 1 Inhaler 2  . benzonatate (TESSALON) 100 MG capsule Take 1 capsule (100 mg total) by mouth 3 (three) times daily as needed. 30 capsule 0  . budesonide-formoterol (SYMBICORT) 160-4.5 MCG/ACT inhaler Inhale 2 puffs into the lungs 2 (two) times daily. 1 Inhaler 12  . cyclobenzaprine (FLEXERIL) 10 MG tablet Take 1 tablet (10 mg total) by mouth 2 (two) times  daily as needed for muscle spasms. 40 tablet 1  . dabigatran (PRADAXA) 150 MG CAPS capsule Take 1 capsule (150 mg total) by mouth 2 (two) times daily. 180 capsule 3  . HYDROcodone-acetaminophen (NORCO) 7.5-325 MG tablet Take 1 tablet by mouth every 8 (eight) hours as needed for moderate pain. 15 tablet 0  . hydroxychloroquine (PLAQUENIL) 200 MG tablet Take 200 mg by mouth daily.    Marland Kitchen leflunomide (ARAVA) 20 MG tablet Take 20 mg by mouth daily.    Marland Kitchen letrozole (FEMARA) 2.5 MG tablet Take 1 tablet (2.5 mg total) by mouth daily. 90 tablet 3  . omeprazole (PRILOSEC) 40 MG capsule Take 1 capsule (40 mg total) by mouth 2 (two) times daily. 90 capsule 1  . Tiotropium Bromide Monohydrate (SPIRIVA RESPIMAT) 2.5 MCG/ACT AERS Inhale 2 puffs into the lungs every morning. 1 Inhaler 11  . traZODone (DESYREL) 50 MG tablet Take 3 tablets (150 mg total) by mouth at bedtime. 180 tablet 1   No current facility-administered medications for this visit.      PHYSICAL EXAMINATION: ECOG PERFORMANCE STATUS: 2 - Symptomatic, <50% confined to bed Vitals:   07/19/18 1354  BP: (!) 144/87  Pulse: 85  Temp: (!) 97.3 F (36.3 C)   Filed Weights   07/19/18 1354  Weight: 193 lb  (87.5 kg)    Physical Exam Constitutional:      General: She is not in acute distress.    Comments: obese  HENT:     Head: Normocephalic and atraumatic.  Eyes:     General: No scleral icterus.    Pupils: Pupils are equal, round, and reactive to light.  Neck:     Musculoskeletal: Normal range of motion and neck supple.  Cardiovascular:     Rate and Rhythm: Normal rate and regular rhythm.     Heart sounds: Normal heart sounds.  Pulmonary:     Effort: Pulmonary effort is normal. No respiratory distress.     Breath sounds: No wheezing.  Abdominal:     General: Bowel sounds are normal. There is no distension.     Palpations: Abdomen is soft. There is no mass.     Tenderness: There is no abdominal tenderness.  Musculoskeletal: Normal range of motion.        General: No deformity.     Comments: Left groin tenderness soft tissue nodule.  Skin:    General: Skin is warm and dry.     Findings: No erythema or rash.  Neurological:     Mental Status: She is alert and oriented to person, place, and time.     Cranial Nerves: No cranial nerve deficit.     Coordination: Coordination normal.  Psychiatric:        Behavior: Behavior normal.        Thought Content: Thought content normal.      LABORATORY DATA:  I have reviewed the data as listed Lab Results  Component Value Date   WBC 4.5 06/04/2018   HGB 11.7 (L) 06/04/2018   HCT 37.1 06/04/2018   MCV 91.8 06/04/2018   PLT 138 (L) 06/04/2018   Recent Labs    11/16/17 05/06/18 0051 06/04/18 1646  NA 142 140 140  K 4.1 3.8 3.8  CL  --  108 106  CO2  --  26 23  GLUCOSE  --  107* 88  BUN 14 21* 18  CREATININE 0.6 0.56 0.55  CALCIUM  --  9.1 9.2  GFRNONAA  --  >60 >60  GFRAA  --  >60 >60  PROT  --  6.4* 6.2*  ALBUMIN  --  3.9 3.7  AST 17 24 21   ALT 19 26 24   ALKPHOS 57 69 62  BILITOT  --  0.4 0.4   Iron/TIBC/Ferritin/ %Sat No results found for: IRON, TIBC, FERRITIN, IRONPCTSAT   RADIOGRAPHIC STUDIES: I have  personally reviewed the radiological images as listed and agreed with the findings in the report. US venous upper right extremity 06/04/2018  No evidence of DVT within the right upper extremity 07/09/2018 CXR  Interval resolution of subsegmental atelectasis or interstitial edema. Mild chronic bronchitic changes. No acute pneumonia nor pulmonary edema. Thoracic aortic atherosclerosis  ASSESSMENT & PLAN:  1. History of ductal carcinoma in situ (DCIS) of breast   2. Aromatase inhibitor use   3. Lipoma of left lower extremity   4. Rib pain on left side   5. History of DVT (deep vein thrombosis)    I have reviewed patient's previous oncology records. Patient has history of left DICS.  S/p lumpectomy and RT.  Currently on Letrozole.  She has not had DEXA scan recently. Will obtain.  Due for annual mammogram, she has study scheduled this month.   Lipoma, clinically no signs of infection. Patient feels the lipoma bothers her and she has multiple lipoma. She desires to discuss with surgeon and discuss options of resection.  Refer to Dr.pabon.   #History of DVT and PE, continue Pradaxa.   # Rib cage pain, unknown etiology. CXR showed no obvious fracture.  ? Costochondritis, vs occult fracture. Ordered Bone density scan.   #Chronic autoimmune disorders/rheumatoid arthritis/lupus: established care with Dr.Bock   Orders Placed This Encounter  Procedures  . DG Bone Density    Standing Status:   Future    Standing Expiration Date:   07/19/2019    Order Specific Question:   Reason for Exam (SYMPTOM  OR DIAGNOSIS REQUIRED)    Answer:   aromatase inhibitor use    Order Specific Question:   Is the patient pregnant?    Answer:   No    Order Specific Question:   Preferred imaging location?    Answer:   [facility]  . Ambulatory referral to General Surgery    Referral Priority:   Routine    Referral Type:   Surgical    Referral Reason:   Specialty Services Required    Referred to  Provider:   Jules Husbands, MD    Requested Specialty:   General Surgery    Number of Visits Requested:   1    All questions were answered. The patient knows to call the clinic with any problems questions or concerns.  Return of visit: 6 months.    Earlie Server, MD, PhD Hematology Oncology [facility] at [facility] Pager- [phone] 07/19/2018

## 2018-07-22 ENCOUNTER — Encounter: Payer: Self-pay | Admitting: *Deleted

## 2018-07-22 ENCOUNTER — Other Ambulatory Visit: Payer: Self-pay

## 2018-07-22 ENCOUNTER — Ambulatory Visit (INDEPENDENT_AMBULATORY_CARE_PROVIDER_SITE_OTHER): Payer: Medicare Other | Admitting: Surgery

## 2018-07-22 ENCOUNTER — Encounter: Payer: Self-pay | Admitting: Surgery

## 2018-07-22 VITALS — BP 123/79 | HR 88 | Temp 97.7°F | Resp 18 | Ht 59.0 in | Wt 196.4 lb

## 2018-07-22 DIAGNOSIS — R1032 Left lower quadrant pain: Secondary | ICD-10-CM | POA: Diagnosis not present

## 2018-07-22 DIAGNOSIS — R1031 Right lower quadrant pain: Secondary | ICD-10-CM

## 2018-07-22 NOTE — Patient Instructions (Addendum)
We will schedule a CT scan abdomen or pelvis. We will see back after the CT scan has been done.       Lipoma A lipoma is a noncancerous (benign) tumor that is made up of fat cells. This is a very common type of soft-tissue growth. Lipomas are usually found under the skin (subcutaneous). They may occur in any tissue of the body that contains fat. Common areas for lipomas to appear include the back, shoulders, buttocks, and thighs. Lipomas grow slowly, and they are usually painless. Most lipomas do not cause problems and do not require treatment. What are the causes? The cause of this condition is not known. What increases the risk? This condition is more likely to develop in:  People who are 39-35 years old.  People who have a family history of lipomas.  What are the signs or symptoms? A lipoma usually appears as a small, round bump under the skin. It may feel soft or rubbery, but the firmness can vary. Most lipomas are not painful. However, a lipoma may become painful if it is located in an area where it pushes on nerves. How is this diagnosed? A lipoma can usually be diagnosed with a physical exam. You may also have tests to confirm the diagnosis and to rule out other conditions. Tests may include:  Imaging tests, such as a CT scan or MRI.  Removal of a tissue sample to be looked at under a microscope (biopsy).  How is this treated? Treatment is not needed for small lipomas that are not causing problems. If a lipoma continues to get bigger or it causes problems, removal is often the best option. Lipomas can also be removed to improve appearance. Removal of a lipoma is usually done with a surgery in which the fatty cells and the surrounding capsule are removed. Most often, a medicine that numbs the area (local anesthetic) is used for this procedure. Follow these instructions at home:  Keep all follow-up visits as directed by your health care provider. This is important. Contact a  health care provider if:  Your lipoma becomes larger or hard.  Your lipoma becomes painful, red, or increasingly swollen. These could be signs of infection or a more serious condition. This information is not intended to replace advice given to you by your health care provider. Make sure you discuss any questions you have with your health care provider. Document Released: 07/14/2002 Document Revised: 12/30/2015 Document Reviewed: 07/20/2014 Elsevier Interactive Patient Education  [employer].

## 2018-07-22 NOTE — Progress Notes (Signed)
Patient has been scheduled for a CT abdomen/pelvis with contrast at [facility] for 07-29-18 at 2:30 pm (arrive 2:15 pm). Prep: NPO 4 hours prior and pick up prep kit. Patient verbalizes understanding.  The patient will follow up in the office with Dr. Dahlia Byes the same day immediatley after CT scan is complete per Sheila-appointment 07-29-18 at 3:15 pm.

## 2018-07-22 NOTE — Progress Notes (Signed)
Patient ID: Victoria [name], female   DOB: 21-Sep-1960, 57 y.o.   MRN: [id number]  HPI Victoria [name] is a 57 y.o. female in consultation at the request of Dr. Tasia Catchings.  She has some bilateral groin lipomas that are causing some intermittent sharp pains.  There is no specific alleviating or aggravating factors.  Of note the patient has 2 lipomas on her right forearm as well. Have a history of DCIS status post lumpectomy and radiation therapy this was done in [place] 2 years ago. She is  morbidly obese and walks with some assistance. She is anticoagulated on pradaxa. She reports she has some anxiety and does not do well with needles. Have an ultrasound of the groin that I have personally review showing somewhat 2 cm very subjective area questionable lipoma.  No abscess and no concerning lesions for malignancy. Had a CT scan of the abdomen pelvis 3 months ago showing no acute abdominal abnormalities. Have a history of primary fibromyalgia   HPI  Past Medical History:  Diagnosis Date  . Asthma   . Cancer (HCC)    Breast Left, DCIS   . COPD (chronic obstructive pulmonary disease) ([place])   . Diverticulitis   . Fibromyalgia   . GERD (gastroesophageal reflux disease)   . Hearing impaired person, left   . IBS (irritable bowel syndrome)   . Lupus ([place])   . Mitral valve disorder   . MS (multiple sclerosis) ([place])   . Neuromuscular disorder ([place])   . Osteoarthritis   . Rheumatoid arthritis [facility])     Past Surgical History:  Procedure Laterality Date  . ABDOMINAL HYSTERECTOMY    . APPENDECTOMY    . BREAST SURGERY    . CHOLECYSTECTOMY    . PATENT DUCTUS ARTERIOUS REPAIR    . RECONSTRUCTION TENDON PULLEY HAND Left   . TOTAL HIP ARTHROPLASTY Right     Family History  Problem Relation Age of Onset  . COPD Mother   . Stroke Father   . Arthritis Brother   . Diabetes Brother   . Stroke Brother     Social History Social History   Tobacco Use  . Smoking status: Former Smoker    Packs/day: 2.00     Years: 15.00    Pack years: 30.00    Types: Cigarettes    Last attempt to quit: 06/27/1999    Years since quitting: 19.0  . Smokeless tobacco: Never Used  Substance Use Topics  . Alcohol use: Yes    Frequency: Never    Comment: Rarely - approx 6 beers once a month  . Drug use: Never    Allergies  Allergen Reactions  . Sodium Hypochlorite Other (See Comments)    Seizure  . Codeine   . Flagyl [Metronidazole]   . Sulfa Antibiotics     Current Outpatient Medications  Medication Sig Dispense Refill  . albuterol (PROVENTIL HFA;VENTOLIN HFA) 108 (90 Base) MCG/ACT inhaler Inhale 2 puffs into the lungs every 6 (six) hours as needed for wheezing or shortness of breath. 1 Inhaler 2  . benzonatate (TESSALON) 100 MG capsule Take 1 capsule (100 mg total) by mouth 3 (three) times daily as needed. 30 capsule 0  . budesonide-formoterol (SYMBICORT) 160-4.5 MCG/ACT inhaler Inhale 2 puffs into the lungs 2 (two) times daily. 1 Inhaler 12  . cyclobenzaprine (FLEXERIL) 10 MG tablet Take 1 tablet (10 mg total) by mouth 2 (two) times daily as needed for muscle spasms. 40 tablet 1  . dabigatran (PRADAXA) 150 MG CAPS capsule  Take 1 capsule (150 mg total) by mouth 2 (two) times daily. 180 capsule 3  . HYDROcodone-acetaminophen (NORCO) 7.5-325 MG tablet Take 1 tablet by mouth every 8 (eight) hours as needed for moderate pain. 15 tablet 0  . hydroxychloroquine (PLAQUENIL) 200 MG tablet Take 200 mg by mouth daily.    Marland Kitchen leflunomide (ARAVA) 20 MG tablet Take 20 mg by mouth daily.    Marland Kitchen letrozole (FEMARA) 2.5 MG tablet Take 1 tablet (2.5 mg total) by mouth daily. 90 tablet 3  . omeprazole (PRILOSEC) 40 MG capsule Take 1 capsule (40 mg total) by mouth 2 (two) times daily. 90 capsule 1  . Tiotropium Bromide Monohydrate (SPIRIVA RESPIMAT) 2.5 MCG/ACT AERS Inhale 2 puffs into the lungs every morning. 1 Inhaler 11  . traZODone (DESYREL) 50 MG tablet Take 3 tablets (150 mg total) by mouth at bedtime. 180 tablet 1    No current facility-administered medications for this visit.      Review of Systems Full ROS  was asked and was negative except for the information on the HPI  Physical Exam Blood pressure 123/79, pulse 88, temperature 97.7 F (36.5 C), temperature source Temporal, resp. rate 18, height 4\' 11"  (1.499 m), weight 196 lb 6.4 oz (89.1 kg), SpO2 99 %. CONSTITUTIONAL: Obese EYES: Pupils are equal, round, and reactive to light, Sclera are non-icteric. EARS, NOSE, MOUTH AND THROAT: The oropharynx is clear. The oral mucosa is pink and moist. Hearing is intact to voice. LYMPH NODES:  Lymph nodes in the neck are normal. RESPIRATORY:  Lungs are clear. There is normal respiratory effort, with equal breath sounds bilaterally, and without pathologic use of accessory muscles. CARDIOVASCULAR: Heart is regular without murmurs, gallops, or rubs. GI: The abdomen is  soft, nontender, and nondistended. There are no palpable masses. There is no hepatosplenomegaly. There are normal bowel sounds in all quadrants. GU: Rectal deferred.   MUSCULOSKELETAL: Normal muscle strength and tone. No cyanosis or edema.   SKIN: Turgor is good and there are no pathologic skin lesions or ulcers. A 4 cm lipoma on the anteromediall aspect the right forearm.  There is an additional 1/2 cm lipoma on the posterior lateral aspect of the right forearm I was unable to palpate any other lipomas on the groin.  There were no discrete masses. NEUROLOGIC: Motor and sensation is grossly normal. Cranial nerves are grossly intact. PSYCH:  Oriented to person, place and time. Affect is normal.  Data Reviewed  I have personally reviewed the patient's imaging, laboratory findings and medical records.    Assessment/Plan Traumatic lipomas of the right forearm.  I do recommend excising them.  The patient states that she is very nervous and will like to have this done under general anesthetic.  As far as the growing lipomas I am unable to palpate  them and given her body habitus the neck step will be to perform a dedicated CT of the groin areas to see if there is any additional areas that I may be missing on physical exam.  After she completes her work-up she will come back and see me and determine exactly how many lipomas were going to remove in the OR.  Extensive counseling provided.  A copy of this report will be sent to the referring provider   Caroleen Hamman, MD FACS General Surgeon 07/22/2018, 3:06 PM

## 2018-07-23 ENCOUNTER — Ambulatory Visit
Admission: RE | Admit: 2018-07-23 | Discharge: 2018-07-23 | Disposition: A | Discharge status: Home / Self Care | Payer: Medicare Other | Source: Ambulatory Visit | Attending: Oncology | Admitting: Oncology

## 2018-07-23 DIAGNOSIS — Z79811 Long term (current) use of aromatase inhibitors: Secondary | ICD-10-CM | POA: Diagnosis present

## 2018-07-23 DIAGNOSIS — Z86000 Personal history of in-situ neoplasm of breast: Secondary | ICD-10-CM | POA: Insufficient documentation

## 2018-07-23 DIAGNOSIS — Z853 Personal history of malignant neoplasm of breast: Secondary | ICD-10-CM | POA: Insufficient documentation

## 2018-07-25 ENCOUNTER — Encounter: Payer: Self-pay | Admitting: Family Medicine

## 2018-07-26 ENCOUNTER — Telehealth: Payer: Self-pay | Admitting: Oncology

## 2018-07-26 NOTE — Telephone Encounter (Signed)
Called patient and communicated with her about bone density result. She is osteoporosis.  I recommend patient to start IV bisphosphonate treatments. Rationale and side effects including but not limited to hypocalcemia, bone necrosis, OMJ, etc discussed with her.  Recommend patient to obtain dental clearance letter and send to [place]. I ask patient to call office when she has dental clearance ready and will schedule patient to get IV Zometa. She voices understanding and repeated instructions back to me.

## 2018-07-29 ENCOUNTER — Ambulatory Visit
Admission: RE | Admit: 2018-07-29 | Discharge: 2018-07-29 | Disposition: A | Discharge status: Home / Self Care | Payer: Medicare Other | Source: Ambulatory Visit | Attending: Surgery | Admitting: Surgery

## 2018-07-29 ENCOUNTER — Ambulatory Visit: Payer: Medicare Other | Admitting: Surgery

## 2018-07-29 DIAGNOSIS — R1031 Right lower quadrant pain: Secondary | ICD-10-CM | POA: Insufficient documentation

## 2018-07-29 DIAGNOSIS — R1032 Left lower quadrant pain: Secondary | ICD-10-CM | POA: Diagnosis present

## 2018-07-29 HISTORY — DX: Systemic involvement of connective tissue, unspecified: M35.9

## 2018-07-29 MED ORDER — IOPAMIDOL (ISOVUE-300) INJECTION 61%
100.0000 mL | Freq: Once | INTRAVENOUS | Status: AC | PRN
  Administered 2018-07-29: 100 mL via INTRAVENOUS

## 2018-07-29 MED ORDER — IOPAMIDOL (ISOVUE-370) INJECTION 76%: 100.0000 mL | Freq: Once | INTRAVENOUS | Status: DC | PRN

## 2018-08-01 ENCOUNTER — Telehealth: Payer: Self-pay | Admitting: *Deleted

## 2018-08-01 NOTE — Telephone Encounter (Signed)
PA submitted for Spiriva Respimat to [facility], awaiting response.

## 2018-08-02 ENCOUNTER — Telehealth: Payer: Self-pay | Admitting: *Deleted

## 2018-08-02 NOTE — Telephone Encounter (Signed)
-----   Message from Jules Husbands, MD sent at 07/31/2018 10:03 AM EST ----- Please let her know that CT did not show any groin lipoma. She may follow up for excision of arm lipomas in the office in 2-3 weeks ----- Message ----- From: Interface, Rad Results In Sent: 07/30/2018   8:22 AM EST To: Jules Husbands, MD

## 2018-08-02 NOTE — Telephone Encounter (Signed)
Notified patient as instructed, patient pleased. Patient will call back and scheduled lipoma removal. patient agrees

## 2018-08-02 NOTE — Telephone Encounter (Signed)
PA for Spiriva Respimat approved 05/03/18 thru 08/01/19.  Approval letter placed in [facility] office for initialing and scanning.

## 2018-08-05 NOTE — Telephone Encounter (Signed)
Noted  

## 2018-08-08 ENCOUNTER — Telehealth: Payer: Self-pay

## 2018-08-08 NOTE — Telephone Encounter (Signed)
FYI: Received a message from [facility] ortho in [place] that patient cancelled her appointment that was scheduled with Cameron Proud for 07/26/18. I spoke with patient and she states she found another office with [facility] but is not sure who she is seen or when. She is waiting on her paperwork from that office so can completed and get scheduled.

## 2018-08-09 NOTE — Telephone Encounter (Signed)
Noted  

## 2018-08-19 ENCOUNTER — Ambulatory Visit: Payer: Medicare Other | Admitting: Surgery

## 2018-08-22 DIAGNOSIS — R0602 Shortness of breath: Secondary | ICD-10-CM | POA: Insufficient documentation

## 2018-08-23 DIAGNOSIS — Z6841 Body Mass Index (BMI) 40.0 and over, adult: Secondary | ICD-10-CM

## 2018-08-24 ENCOUNTER — Other Ambulatory Visit: Payer: Self-pay | Admitting: Family Medicine

## 2018-08-24 DIAGNOSIS — M25552 Pain in left hip: Secondary | ICD-10-CM

## 2018-08-26 NOTE — Telephone Encounter (Signed)
Refilled once in PCP absence

## 2018-08-26 NOTE — Telephone Encounter (Signed)
Electronic refill request Cyclobenzaprine Last refill 06/21/18 #40/1 Last office visit 07/12/18 Upcoming appointment 09/13/18 AMW

## 2018-09-03 ENCOUNTER — Encounter: Payer: Self-pay | Admitting: Oncology

## 2018-09-03 ENCOUNTER — Telehealth: Payer: Self-pay | Admitting: *Deleted

## 2018-09-03 DIAGNOSIS — M1611 Unilateral primary osteoarthritis, right hip: Secondary | ICD-10-CM | POA: Insufficient documentation

## 2018-09-03 DIAGNOSIS — Z96641 Presence of right artificial hip joint: Secondary | ICD-10-CM | POA: Insufficient documentation

## 2018-09-03 NOTE — Telephone Encounter (Signed)
Patient reports she went to see her orto doc at [facility] and he wanted blood drawn, but they do not draw form ports, so she is asking if we can draw the blood her ortho doctor wants.Please advise

## 2018-09-03 NOTE — Telephone Encounter (Signed)
Ok. Please cc results to Orthopedic surgeon.

## 2018-09-04 NOTE — Telephone Encounter (Signed)
Notified pt via mychart

## 2018-09-09 ENCOUNTER — Other Ambulatory Visit: Payer: Self-pay

## 2018-09-09 DIAGNOSIS — M1990 Unspecified osteoarthritis, unspecified site: Secondary | ICD-10-CM

## 2018-09-12 ENCOUNTER — Inpatient Hospital Stay: Payer: Commercial Managed Care - HMO | Attending: Oncology

## 2018-09-12 DIAGNOSIS — D0512 Intraductal carcinoma in situ of left breast: Secondary | ICD-10-CM | POA: Insufficient documentation

## 2018-09-12 DIAGNOSIS — M1990 Unspecified osteoarthritis, unspecified site: Secondary | ICD-10-CM

## 2018-09-12 DIAGNOSIS — Z86000 Personal history of in-situ neoplasm of breast: Secondary | ICD-10-CM

## 2018-09-12 DIAGNOSIS — Z170 Estrogen receptor positive status [ER+]: Secondary | ICD-10-CM | POA: Diagnosis not present

## 2018-09-12 DIAGNOSIS — Z79811 Long term (current) use of aromatase inhibitors: Secondary | ICD-10-CM | POA: Diagnosis not present

## 2018-09-12 LAB — CBC WITH DIFFERENTIAL/PLATELET
Abs Immature Granulocytes: 0.01 10*3/uL (ref 0.00–0.07)
Basophils Absolute: 0 10*3/uL (ref 0.0–0.1)
Basophils Relative: 0 %
Eosinophils Absolute: 0 10*3/uL (ref 0.0–0.5)
Eosinophils Relative: 0 %
HCT: 39.2 % (ref 36.0–46.0)
Hemoglobin: 12.7 g/dL (ref 12.0–15.0)
Immature Granulocytes: 0 %
Lymphocytes Relative: 30 %
Lymphs Abs: 0.9 10*3/uL (ref 0.7–4.0)
MCH: 28.5 pg (ref 26.0–34.0)
MCHC: 32.4 g/dL (ref 30.0–36.0)
MCV: 87.9 fL (ref 80.0–100.0)
Monocytes Absolute: 0.2 10*3/uL (ref 0.1–1.0)
Monocytes Relative: 8 %
Neutro Abs: 1.8 10*3/uL (ref 1.7–7.7)
Neutrophils Relative %: 62 %
PLATELETS: 124 10*3/uL — AB (ref 150–400)
RBC: 4.46 MIL/uL (ref 3.87–5.11)
RDW: 14.1 % (ref 11.5–15.5)
WBC: 2.9 10*3/uL — ABNORMAL LOW (ref 4.0–10.5)
nRBC: 0 % (ref 0.0–0.2)

## 2018-09-12 LAB — C-REACTIVE PROTEIN: CRP: <0.8 mg/dL (ref <1.0)

## 2018-09-12 LAB — SEDIMENTATION RATE: Sed Rate: 16 mm/hr (ref 0–30)

## 2018-09-12 MED ORDER — HEPARIN SOD (PORK) LOCK FLUSH 100 UNIT/ML IV SOLN
500.0000 [IU] | Freq: Once | INTRAVENOUS | Status: AC
  Administered 2018-09-12: 500 [IU] via INTRAVENOUS

## 2018-09-12 MED ORDER — SODIUM CHLORIDE 0.9% FLUSH
10.0000 mL | Freq: Once | INTRAVENOUS | Status: AC
  Administered 2018-09-12: 10 mL via INTRAVENOUS
  Filled 2018-09-12: qty 10

## 2018-09-13 ENCOUNTER — Ambulatory Visit: Payer: Medicare Other

## 2018-09-17 DIAGNOSIS — Z0000 Encounter for general adult medical examination without abnormal findings: Secondary | ICD-10-CM | POA: Insufficient documentation

## 2018-09-17 DIAGNOSIS — K589 Irritable bowel syndrome without diarrhea: Secondary | ICD-10-CM | POA: Insufficient documentation

## 2018-09-17 DIAGNOSIS — I82509 Chronic embolism and thrombosis of unspecified deep veins of unspecified lower extremity: Secondary | ICD-10-CM | POA: Insufficient documentation

## 2018-09-18 ENCOUNTER — Encounter: Payer: Medicare Other | Admitting: Family Medicine

## 2018-09-24 ENCOUNTER — Other Ambulatory Visit: Payer: Self-pay | Admitting: Family Medicine

## 2018-09-24 DIAGNOSIS — M25552 Pain in left hip: Secondary | ICD-10-CM

## 2018-09-24 NOTE — Telephone Encounter (Signed)
Electronic refill request. Cyclobenzaprine Last office visit:   07/12/18 Last Filled:    40 tablet 0 08/26/2018  Please advise.

## 2018-10-11 DIAGNOSIS — I700 Atherosclerosis of aorta: Secondary | ICD-10-CM | POA: Insufficient documentation

## 2018-10-11 DIAGNOSIS — R51 Headache: Secondary | ICD-10-CM

## 2018-10-11 DIAGNOSIS — R519 Headache, unspecified: Secondary | ICD-10-CM | POA: Insufficient documentation

## 2018-10-11 DIAGNOSIS — J449 Chronic obstructive pulmonary disease, unspecified: Secondary | ICD-10-CM | POA: Insufficient documentation

## 2018-10-13 ENCOUNTER — Encounter: Payer: Self-pay | Admitting: Student in an Organized Health Care Education/Training Program

## 2018-10-14 DIAGNOSIS — R51 Headache: Secondary | ICD-10-CM

## 2018-10-14 DIAGNOSIS — R519 Headache, unspecified: Secondary | ICD-10-CM | POA: Insufficient documentation

## 2018-10-14 DIAGNOSIS — M2550 Pain in unspecified joint: Secondary | ICD-10-CM | POA: Insufficient documentation

## 2018-10-21 ENCOUNTER — Encounter: Payer: Self-pay | Admitting: Student in an Organized Health Care Education/Training Program

## 2018-10-21 ENCOUNTER — Ambulatory Visit
Payer: Medicare Other | Attending: Student in an Organized Health Care Education/Training Program | Admitting: Student in an Organized Health Care Education/Training Program

## 2018-10-21 ENCOUNTER — Other Ambulatory Visit: Payer: Self-pay

## 2018-10-21 VITALS — BP 115/52 | HR 88 | Temp 98.0°F | Resp 16 | Ht <= 58 in | Wt 198.6 lb

## 2018-10-21 DIAGNOSIS — M328 Other forms of systemic lupus erythematosus: Secondary | ICD-10-CM

## 2018-10-21 DIAGNOSIS — I82509 Chronic embolism and thrombosis of unspecified deep veins of unspecified lower extremity: Secondary | ICD-10-CM | POA: Diagnosis present

## 2018-10-21 DIAGNOSIS — M1380 Other specified arthritis, unspecified site: Secondary | ICD-10-CM | POA: Diagnosis present

## 2018-10-21 DIAGNOSIS — M1611 Unilateral primary osteoarthritis, right hip: Secondary | ICD-10-CM | POA: Diagnosis present

## 2018-10-21 DIAGNOSIS — M069 Rheumatoid arthritis, unspecified: Secondary | ICD-10-CM | POA: Diagnosis present

## 2018-10-21 DIAGNOSIS — M2550 Pain in unspecified joint: Secondary | ICD-10-CM

## 2018-10-21 DIAGNOSIS — G894 Chronic pain syndrome: Secondary | ICD-10-CM | POA: Diagnosis present

## 2018-10-21 DIAGNOSIS — M797 Fibromyalgia: Secondary | ICD-10-CM

## 2018-10-21 NOTE — Patient Instructions (Signed)
You have been referred to psych. Please follow through with this appointment prior to next visit to pain clinic.

## 2018-10-21 NOTE — Progress Notes (Signed)
Safety precautions to be maintained throughout the outpatient stay will include: orient to surroundings, keep bed in low position, maintain call bell within reach at all times, provide assistance with transfer out of bed and ambulation.  

## 2018-10-21 NOTE — Progress Notes (Signed)
Patient's Name: Victoria [name]  MRN: [id number]  Referring Provider: Kirk Ruths, MD  DOB: February 03, 1961  PCP: Elby Beck, FNP  DOS: 10/21/2018  Note by: Gillis Santa, MD  Service setting: Ambulatory outpatient  Specialty: Interventional Pain Management  Location: [facility] (AMB) Pain Management Facility  Visit type: Initial Patient Evaluation  Patient type: New Patient   Primary Reason(s) for Visit: Encounter for initial evaluation of one or more chronic problems (new to examiner) potentially causing chronic pain, and posing a threat to normal musculoskeletal function. (Level of risk: High) CC: Peripheral Neuropathy and Generalized joint pain  HPI  [name] is a 58 y.o. year old, female patient, who comes today to see [place] for the first time for an initial evaluation of her chronic pain. She has History of ductal carcinoma in situ (DCIS) of breast; Aromatase inhibitor use; Abnormal findings on auscultation; Encounter for long-term (current) use of high-risk medication; Fibromyalgia; Other forms of systemic lupus erythematosus ([place]); Other osteoporosis without current pathological fracture; Seronegative arthritis; Chronic daily headache; COPD (chronic obstructive pulmonary disease) ([place]); Multiple episodes of deep venous thrombosis ([place]); Polyarthralgia; Primary osteoarthritis of right hip; and Headache disorder on their problem list. Today she comes in for evaluation of her Peripheral Neuropathy and Generalized joint pain  Pain Assessment: Location:   Generalized(neuropathy and joint pain) Radiating:   Onset: More than a month ago Duration: Chronic pain, Neuropathic pain Quality: Constant, Burning, Sharp, Shooting, Numbness, Tingling(lower back is a "piercing pain") Severity: 9 /10 (subjective, self-reported pain score)  Note: Reported level is inconsistent with clinical observations.                         When using our objective Pain Scale, levels between 6 and 10/10 are said to  belong in an emergency room, as it progressively worsens from a 6/10, described as severely limiting, requiring emergency care not usually available at an outpatient pain management facility. At a 6/10 level, communication becomes difficult and requires great effort. Assistance to reach the emergency department may be required. Facial flushing and profuse sweating along with potentially dangerous increases in heart rate and blood pressure will be evident. Effect on ADL: "There is a lot I used to do that I cannot do anymore" Timing: Constant Modifying factors: "nothing helps now-hydrocodone has helped in the past - got pain to a 4" BP: (!) 115/52  HR: 88  Onset and Duration: Present longer than 3 months Cause of pain: Unknown Severity: Getting worse, NAS-11 at its worse: 10/10, NAS-11 at its best: 9/10, NAS-11 now: 9/10 and NAS-11 on the average: 9/10 Timing: Not influenced by the time of the day Aggravating Factors: Bending, Climbing, Kneeling, Lifiting, Motion, Prolonged sitting, Prolonged standing, Squatting, Stooping , Twisting, Walking, Walking uphill, Walking downhill and Working Alleviating Factors: Resting, Sleeping and Warm showers or baths Associated Problems: Fatigue, Inability to concentrate, Numbness, Spasms, Swelling, Tingling, Weakness, Pain that wakes patient up and Pain that does not allow patient to sleep Quality of Pain: Agonizing, Burning, Deep, Exhausting, Getting longer, Horrible, Nagging, Sharp, Stabbing, Tender and Tingling Previous Examinations or Tests: Biopsy, Bone scan, CT scan, Spinal tap, X-rays, Neurological evaluation and Orthopedic evaluation Previous Treatments: Narcotic medications, Physical Therapy, Pool exercises, Steroid treatments by mouth and TENS  The patient comes into the clinics today for the first time for a chronic pain management evaluation.   Patient is a 58 year old female with a history of chronic pain secondary to rheumatoid arthritis, lupus,  fibromyalgia, osteoarthritis.  Patient moved from [place].  There she was being seen by rheumatology.  She was being managed on hydrocodone 7.5 mg daily to twice daily as needed.  Majority of patient's pain is in her joints including wrist, elbow, shoulder, hips, knees.  She also has a diagnosis of osteopenia.  Patient's current medications include Plaquenil, Topamax, gabapentin 600 mg 3 times daily.  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  She states that she had an epidural for her labor and had a bad experience with it and is not interested in interventional procedures.  Patient is tried physical therapy in the past along with TENS unit, stretching.  She also complains of paresthesias in her hands and lower feet.  Patient also with a history of breast cancer.  The patient was also made aware of my Comprehensive Pain Management Safety Guidelines where by joining my practice, they limit all of their nerve blocks and joint injections to those done by our practice, for as long as we are retained to manage their care.   Historic Controlled Substance Pharmacotherapy Review  PMP and historical list of controlled substances: Hydrocodone 7.5 mg daily to twice daily as needed.  Last fill was June 2019. MME/day: 7.5-15 mg/day Medications: The patient did not bring the medication(s) to the appointment, as requested in our "New Patient Package" Pharmacodynamics: Desired effects: Analgesia: The patient reports >50% benefit. Reported improvement in function: The patient reports medication allows her to accomplish basic ADLs. Clinically meaningful improvement in function (CMIF): Sustained CMIF goals met Perceived effectiveness: Described as relatively effective, allowing for increase in activities of daily living (ADL) Undesirable effects: Side-effects or Adverse reactions: None reported Historical Monitoring: The patient  reports no history of drug use. List of all UDS  Test(s): No results found for: MDMA, COCAINSCRNUR, [place], [place], CANNABQUANT, [place], [place] List of other Serum/Urine Drug Screening Test(s):  No results found for: AMPHSCRSER, BARBSCRSER, BENZOSCRSER, COCAINSCRSER, COCAINSCRNUR, PCPSCRSER, PCPQUANT, THCSCRSER, THCU, CANNABQUANT, OPIATESCRSER, OXYSCRSER, PROPOXSCRSER, ETH Historical Background Evaluation: [place] PMP: Six (6) year initial data search conducted.              [place] Department of public safety, offender search: Editor, commissioning Information) Non-contributory Risk Assessment Profile: Aberrant behavior: None observed or detected today Risk factors for fatal opioid overdose: None identified today Fatal overdose hazard ratio (HR): Calculation deferred Non-fatal overdose hazard ratio (HR): Calculation deferred Risk of opioid abuse or dependence: 0.7-3.0% with doses ? 36 MME/day and 6.1-26% with doses ? 120 MME/day. Substance use disorder (SUD) risk level: Pending results of Medical Psychology Evaluation for SUD Personal History of Substance Abuse (SUD-Substance use disorder):  Alcohol: Negative  Illegal Drugs: Negative  Rx Drugs: Negative  ORT Risk Level calculation: Low Risk Opioid Risk Tool - 10/21/18 1452      Family History of Substance Abuse   Alcohol  Negative    Illegal Drugs  Negative    Rx Drugs  Negative      Personal History of Substance Abuse   Alcohol  Negative    Illegal Drugs  Negative    Rx Drugs  Negative      Age   Age between 2-45 years   No      History of Preadolescent Sexual Abuse   History of Preadolescent Sexual Abuse  Negative or Female      Psychological Disease   Psychological Disease  Negative    Depression  Negative      Total Score  Opioid Risk Tool Scoring  0    Opioid Risk Interpretation  Low Risk      ORT Scoring interpretation table:  Score <3 = Low Risk for SUD  Score between 4-7 = Moderate Risk for SUD  Score >8 = High Risk for Opioid Abuse   PHQ-2 Depression Scale:  Total score:     PHQ-2 Scoring interpretation table: (Score and probability of major depressive disorder)  Score 0 = No depression  Score 1 = 15.4% Probability  Score 2 = 21.1% Probability  Score 3 = 38.4% Probability  Score 4 = 45.5% Probability  Score 5 = 56.4% Probability  Score 6 = 78.6% Probability   PHQ-9 Depression Scale:  Total score:    PHQ-9 Scoring interpretation table:  Score 0-4 = No depression  Score 5-9 = Mild depression  Score 10-14 = Moderate depression  Score 15-19 = Moderately severe depression  Score 20-27 = Severe depression (2.4 times higher risk of SUD and 2.89 times higher risk of overuse)   Pharmacologic Plan: As per protocol, I have not taken over any controlled substance management, pending the results of ordered tests and/or consults.            Initial impression: Pending review of available data and ordered tests.  Meds   Current Outpatient Medications:  .  albuterol (PROVENTIL HFA;VENTOLIN HFA) 108 (90 Base) MCG/ACT inhaler, Inhale 2 puffs into the lungs every 6 (six) hours as needed for wheezing or shortness of breath., Disp: 1 Inhaler, Rfl: 2 .  benzonatate (TESSALON) 100 MG capsule, Take 1 capsule (100 mg total) by mouth 3 (three) times daily as needed., Disp: 30 capsule, Rfl: 0 .  budesonide-formoterol (SYMBICORT) 160-4.5 MCG/ACT inhaler, Inhale 2 puffs into the lungs 2 (two) times daily., Disp: 1 Inhaler, Rfl: 12 .  cyclobenzaprine (FLEXERIL) 10 MG tablet, TAKE 1 TABLET BY MOUTH TWICE A DAY AS NEEDED FOR MUSCLE SPASMS, Disp: 40 tablet, Rfl: 0 .  dabigatran (PRADAXA) 150 MG CAPS capsule, Take 1 capsule (150 mg total) by mouth 2 (two) times daily., Disp: 180 capsule, Rfl: 3 .  gabapentin (NEURONTIN) 300 MG capsule, Take 600 mg by mouth 3 (three) times daily., Disp: , Rfl:  .  hydroxychloroquine (PLAQUENIL) 200 MG tablet, Take 200 mg by mouth daily., Disp: , Rfl:  .  leflunomide (ARAVA) 20 MG tablet, Take 20 mg by mouth daily., Disp: , Rfl:  .  letrozole (FEMARA)  2.5 MG tablet, Take 1 tablet (2.5 mg total) by mouth daily., Disp: 90 tablet, Rfl: 3 .  omeprazole (PRILOSEC) 40 MG capsule, Take 1 capsule (40 mg total) by mouth 2 (two) times daily., Disp: 90 capsule, Rfl: 1 .  Tiotropium Bromide Monohydrate (SPIRIVA RESPIMAT) 2.5 MCG/ACT AERS, Inhale 2 puffs into the lungs every morning., Disp: 1 Inhaler, Rfl: 11 .  Topiramate (TOPAMAX PO), Take by mouth., Disp: , Rfl:  .  traZODone (DESYREL) 50 MG tablet, Take 3 tablets (150 mg total) by mouth at bedtime., Disp: 180 tablet, Rfl: 1  Imaging Review  Cervical Imaging:  Shoulder-R DG:  Results for orders placed during the hospital encounter of 06/25/18  DG Shoulder Right   Narrative CLINICAL DATA:  Persistent RIGHT shoulder pain.  EXAM: RIGHT SHOULDER - 2+ VIEW  COMPARISON:  RIGHT upper extremity ultrasound June 04, 2018  FINDINGS: The humeral head is well-formed and located. The subacromial, glenohumeral and acromioclavicular joint spaces are intact. No advanced degenerative change. No destructive bony lesions. Soft tissue planes are non-suspicious. RIGHT chest  Port-A-Cath  IMPRESSION: Negative.   Electronically Signed   By: Elon Alas M.D.   On: 06/25/2018 14:50    Results for orders placed in visit on 12/27/16  MR [place]    Results for orders placed in visit on 11/20/16  DG Outside Films Spine    Results for orders placed during the hospital encounter of 06/21/18  DG HIP UNILAT WITH PELVIS 2-3 VIEWS LEFT   Narrative CLINICAL DATA:  Left hip pain, no known injury, initial encounter  EXAM: DG HIP (WITH OR WITHOUT PELVIS) 2-3V LEFT  COMPARISON:  None.  FINDINGS: Pelvic ring is intact. Right hip replacement is noted. No acute fracture or dislocation is seen. Mild degenerative changes of left hip joint are noted.  IMPRESSION: Mild degenerative change without acute abnormality.   Electronically Signed   By: Inez Catalina M.D.   On: 06/21/2018 13:51      Elbow-R DG Complete:  Results for orders placed during the hospital encounter of 06/25/18  DG Elbow Complete Right   Narrative CLINICAL DATA:  Persistent RIGHT elbow pain. History of lupus and breast cancer.  EXAM: RIGHT ELBOW - COMPLETE 3+ VIEW  COMPARISON:  None.  FINDINGS: There is no evidence of fracture, dislocation, or joint effusion. There is no evidence of arthropathy or other focal bone abnormality. Soft tissues are unremarkable.  IMPRESSION: Negative.   Electronically Signed   By: Elon Alas M.D.   On: 06/25/2018 14:50    Elbow-L DG Complete: No results found for this or any previous visit.    Complexity Note: Imaging results reviewed. Results shared with Ms. Gemmill, using Layman's terms.                         ROS  Cardiovascular: Blood thinners:  Antiplatelet Pulmonary or Respiratory: Lung problems, Wheezing and difficulty taking a deep full breath (Asthma), Difficulty blowing air out (Emphysema) and Shortness of breath Neurological: No reported neurological signs or symptoms such as seizures, abnormal skin sensations, urinary and/or fecal incontinence, being born with an abnormal open spine and/or a tethered spinal cord Review of Past Neurological Studies:  Results for orders placed or performed during the hospital encounter of 06/04/18  CT Head Wo Contrast   Narrative   CLINICAL DATA:  Headache.  EXAM: CT HEAD WITHOUT CONTRAST  TECHNIQUE: Contiguous axial images were obtained from the base of the skull through the vertex without intravenous contrast.  COMPARISON:  None.  FINDINGS: Brain: Mild chronic ischemic white matter disease is noted. No mass effect or midline shift is noted. Ventricular size is within normal limits. There is no evidence of mass lesion, hemorrhage or acute infarction.  Vascular: No hyperdense vessel or unexpected calcification.  Skull: Normal. Negative for fracture or focal lesion.  Sinuses/Orbits: No  acute finding.  Other: None.  IMPRESSION: Mild chronic ischemic white matter disease. No acute intracranial abnormality seen.   Electronically Signed   By: Marijo Conception, M.D.   On: 06/04/2018 17:08    Psychological-Psychiatric: No reported psychological or psychiatric signs or symptoms such as difficulty sleeping, anxiety, depression, delusions or hallucinations (schizophrenial), mood swings (bipolar disorders) or suicidal ideations or attempts Gastrointestinal: No reported gastrointestinal signs or symptoms such as vomiting or evacuating blood, reflux, heartburn, alternating episodes of diarrhea and constipation, inflamed or scarred liver, or pancreas or irrregular and/or infrequent bowel movements Genitourinary: No reported renal or genitourinary signs or symptoms such as difficulty voiding or producing urine, peeing blood,  non-functioning kidney, kidney stones, difficulty emptying the bladder, difficulty controlling the flow of urine, or chronic kidney disease Hematological: Weakness due to low blood hemoglobin or red blood cell count (Anemia), Brusing easily and Low platelet levels (Thrombocytopenia) Endocrine: No reported endocrine signs or symptoms such as high or low blood sugar, rapid heart rate due to high thyroid levels, obesity or weight gain due to slow thyroid or thyroid disease Rheumatologic: Butterfly-like facial rash (Lupus), Joint aches and or swelling due to excess weight (Osteoarthritis), Rheumatoid arthritis, Generalized muscle aches (Fibromyalgia) and Constant unexplained fatigue (Chronic Fatigue Syndrome) Musculoskeletal: Multiple sclerosis Work History: Disabled  Allergies  Ms. Pittinger is allergic to sodium hypochlorite; codeine; flagyl [metronidazole]; and sulfa antibiotics.  Laboratory Chemistry  Inflammation Markers (CRP: Acute Phase) (ESR: Chronic Phase) Lab Results  Component Value Date   CRP <0.8 09/12/2018   ESRSEDRATE 16 09/12/2018                          Rheumatology Markers No results found for: RF, ANA, LABURIC, URICUR, LYMEIGGIGMAB, LYMEABIGMQN, HLAB27                      Renal Function Markers Lab Results  Component Value Date   BUN 18 06/04/2018   CREATININE 0.55 06/04/2018   GFRAA >60 06/04/2018   GFRNONAA >60 06/04/2018                             Hepatic Function Markers Lab Results  Component Value Date   AST 21 06/04/2018   ALT 24 06/04/2018   ALBUMIN 3.7 06/04/2018   ALKPHOS 62 06/04/2018                        Electrolytes Lab Results  Component Value Date   NA 140 06/04/2018   K 3.8 06/04/2018   CL 106 06/04/2018   CALCIUM 9.2 06/04/2018                        Neuropathy Markers No results found for: VITAMINB12, FOLATE, HGBA1C, HIV                      CNS Tests No results found for: COLORCSF, APPEARCSF, RBCCOUNTCSF, WBCCSF, POLYSCSF, LYMPHSCSF, EOSCSF, PROTEINCSF, GLUCCSF, JCVIRUS, CSFOLI, IGGCSF                      Bone Pathology Markers No results found for: VD25OH, [id number], [id number], [id number], 25OHVITD1, 25OHVITD2, 25OHVITD3, TESTOFREE, TESTOSTERONE                       Coagulation Parameters Lab Results  Component Value Date   INR 1.04 06/04/2018   LABPROT 13.5 06/04/2018   PLT 124 (L) 09/12/2018                        Cardiovascular Markers Lab Results  Component Value Date   TROPONINI <0.03 06/04/2018   HGB 12.7 09/12/2018   HCT 39.2 09/12/2018                         CA Markers No results found for: CEA, CA125, LABCA2                      Endocrine  Markers No results found for: TSH, FREET4, TESTOFREE, TESTOSTERONE, ESTRADIOL, ESTRADIOLPCT, ESTRADIOLFRE                      Note: Lab results reviewed.  [place]  Drug: Ms. Mohrmann  reports no history of drug use. Alcohol:  reports current alcohol use. Tobacco:  reports that she quit smoking about 19 years ago. Her smoking use included cigarettes. She has a 30.00 pack-year smoking history. She has never used smokeless  tobacco. Medical:  has a past medical history of Asthma, Cancer ([place]), Collagen vascular disease ([place]), COPD (chronic obstructive pulmonary disease) ([place]), Diverticulitis, Fibromyalgia, GERD (gastroesophageal reflux disease), Hearing impaired person, left, IBS (irritable bowel syndrome), Lupus ([place]), Mitral valve disorder, MS (multiple sclerosis) ([place]), Neuromuscular disorder ([place]), Osteoarthritis, and Rheumatoid arthritis ([place]). Family: family history includes Arthritis in her brother; COPD in her mother; Diabetes in her brother; Stroke in her brother and father.  Past Surgical History:  Procedure Laterality Date  . ABDOMINAL HYSTERECTOMY    . APPENDECTOMY    . BREAST EXCISIONAL BIOPSY Left 2017   lumpectomy with radation  . BREAST EXCISIONAL BIOPSY Left 2004   neg surgical bx   . BREAST SURGERY    . CHOLECYSTECTOMY    . PATENT DUCTUS ARTERIOUS REPAIR    . RECONSTRUCTION TENDON PULLEY HAND Left   . TOTAL HIP ARTHROPLASTY Right    Active Ambulatory Problems    Diagnosis Date Noted  . History of ductal carcinoma in situ (DCIS) of breast 07/20/2018  . Aromatase inhibitor use 07/20/2018  . Abnormal findings on auscultation 07/16/2018  . Encounter for long-term (current) use of high-risk medication 07/16/2018  . Fibromyalgia 07/16/2018  . Other forms of systemic lupus erythematosus ([place]) 07/16/2018  . Other osteoporosis without current pathological fracture 07/16/2018  . Seronegative arthritis 07/16/2018  . Chronic daily headache 10/11/2018  . COPD (chronic obstructive pulmonary disease) ([place]) 10/11/2018  . Multiple episodes of deep venous thrombosis ([place]) 09/17/2018  . Polyarthralgia 10/14/2018  . Primary osteoarthritis of right hip 09/03/2018  . Headache disorder 10/14/2018   Resolved Ambulatory Problems    Diagnosis Date Noted  . No Resolved Ambulatory Problems   Past Medical History:  Diagnosis Date  . Asthma   . Cancer ([place])   . Collagen vascular disease ([place])   .  Diverticulitis   . GERD (gastroesophageal reflux disease)   . Hearing impaired person, left   . IBS (irritable bowel syndrome)   . Lupus ([place])   . Mitral valve disorder   . MS (multiple sclerosis) ([place])   . Neuromuscular disorder ([place])   . Osteoarthritis   . Rheumatoid arthritis ([place])    Constitutional Exam  General appearance: Well nourished, well developed, and well hydrated. In no apparent acute distress Vitals:   10/21/18 1435  BP: (!) 115/52  Pulse: 88  Resp: 16  Temp: 98 F (36.7 C)  TempSrc: Oral  SpO2: 99%  Weight: 198 lb 9.6 oz (90.1 kg)  Height: '4\' 7"'$  (1.397 m)   BMI Assessment: Estimated body mass index is 46.16 kg/m as calculated from the following:   Height as of this encounter: '4\' 7"'$  (1.397 m).   Weight as of this encounter: 198 lb 9.6 oz (90.1 kg).  BMI interpretation table: BMI level Category Range association with higher incidence of chronic pain  <18 kg/m2 Underweight   18.5-24.9 kg/m2 Ideal body weight   25-29.9 kg/m2 Overweight Increased incidence by 20%  30-34.9 kg/m2 Obese (Class I) Increased incidence by  68%  35-39.9 kg/m2 Severe obesity (Class II) Increased incidence by 136%  >40 kg/m2 Extreme obesity (Class III) Increased incidence by 254%   Patient's current BMI Ideal Body weight  Body mass index is 46.16 kg/m. Patient must be at least 60 in tall to calculate ideal body weight   BMI Readings from Last 4 Encounters:  10/21/18 46.16 kg/m  07/22/18 39.67 kg/m  07/19/18 44.86 kg/m  07/12/18 44.86 kg/m   Wt Readings from Last 4 Encounters:  10/21/18 198 lb 9.6 oz (90.1 kg)  07/22/18 196 lb 6.4 oz (89.1 kg)  07/19/18 193 lb (87.5 kg)  07/12/18 193 lb (87.5 kg)  Psych/Mental status: Alert, oriented x 3 (person, place, & time)       Eyes: PERLA Respiratory: No evidence of acute respiratory distress  Cervical Spine Area Exam  Skin & Axial Inspection: No masses, redness, edema, swelling, or associated skin lesions Alignment:  Symmetrical Functional ROM: Unrestricted ROM      Stability: No instability detected Muscle Tone/Strength: Functionally intact. No obvious neuro-muscular anomalies detected. Sensory (Neurological): Unimpaired Palpation: No palpable anomalies              Upper Extremity (UE) Exam    Side: Right upper extremity  Side: Left upper extremity  Skin & Extremity Inspection: Skin color, temperature, and hair growth are WNL. No peripheral edema or cyanosis. No masses, redness, swelling, asymmetry, or associated skin lesions. No contractures.  Skin & Extremity Inspection: Skin color, temperature, and hair growth are WNL. No peripheral edema or cyanosis. No masses, redness, swelling, asymmetry, or associated skin lesions. No contractures.  Functional ROM: Unrestricted ROM          Functional ROM: Unrestricted ROM          Muscle Tone/Strength: Functionally intact. No obvious neuro-muscular anomalies detected.  Muscle Tone/Strength: Functionally intact. No obvious neuro-muscular anomalies detected.  Sensory (Neurological): Musculoskeletal pain pattern          Sensory (Neurological): Musculoskeletal pain pattern          Palpation: No palpable anomalies              Palpation: No palpable anomalies              Provocative Test(s):  Phalen's test: deferred Tinel's test: deferred Apley's scratch test (touch opposite shoulder):  Action 1 (Across chest): deferred Action 2 (Overhead): deferred Action 3 (LB reach): deferred   Provocative Test(s):  Phalen's test: deferred Tinel's test: deferred Apley's scratch test (touch opposite shoulder):  Action 1 (Across chest): deferred Action 2 (Overhead): deferred Action 3 (LB reach): deferred    Thoracic Spine Area Exam  Skin & Axial Inspection: No masses, redness, or swelling Alignment: Symmetrical Functional ROM: Unrestricted ROM Stability: No instability detected Muscle Tone/Strength: Functionally intact. No obvious neuro-muscular anomalies  detected. Sensory (Neurological): Unimpaired Muscle strength & Tone: No palpable anomalies  Lumbar Spine Area Exam  Skin & Axial Inspection: No masses, redness, or swelling Alignment: Symmetrical Functional ROM: Pain restricted ROM       Stability: No instability detected Muscle Tone/Strength: Functionally intact. No obvious neuro-muscular anomalies detected. Sensory (Neurological): Musculoskeletal pain pattern Palpation: No palpable anomalies       Provocative Tests: Hyperextension/rotation test: (+) bilaterally for facet joint pain. Lumbar quadrant test (Kemp's test): deferred today       Lateral bending test: (+) due to pain. Patrick's Maneuver: (+) for bilateral hip arthralgia             FABER* test:  deferred today                   S-I anterior distraction/compression test: deferred today         S-I lateral compression test: deferred today         S-I Thigh-thrust test: deferred today         S-I Gaenslen's test: deferred today         *(Flexion, ABduction and External Rotation)  Gait & Posture Assessment  Ambulation: Unassisted Gait: Relatively normal for age and body habitus Posture: WNL   Lower Extremity Exam    Side: Right lower extremity  Side: Left lower extremity  Stability: No instability observed          Stability: No instability observed          Skin & Extremity Inspection: Skin color, temperature, and hair growth are WNL. No peripheral edema or cyanosis. No masses, redness, swelling, asymmetry, or associated skin lesions. No contractures.  Skin & Extremity Inspection: Skin color, temperature, and hair growth are WNL. No peripheral edema or cyanosis. No masses, redness, swelling, asymmetry, or associated skin lesions. No contractures.  Functional ROM: Pain restricted ROM for hip and knee joints          Functional ROM: Pain restricted ROM for hip and knee joints          Muscle Tone/Strength: Functionally intact. No obvious neuro-muscular anomalies detected.   Muscle Tone/Strength: Functionally intact. No obvious neuro-muscular anomalies detected.  Sensory (Neurological): Musculoskeletal pain pattern        Sensory (Neurological): Musculoskeletal pain pattern        DTR: Patellar: 1+: trace Achilles: deferred today Plantar: deferred today  DTR: Patellar: 1+: trace Achilles: deferred today Plantar: deferred today  Palpation: No palpable anomalies  Palpation: No palpable anomalies   Assessment  Primary Diagnosis & Pertinent Problem List: The primary encounter diagnosis was Rheumatoid arthritis involving multiple sites, unspecified rheumatoid factor presence ([place]). Diagnoses of Fibromyalgia, Polyarthralgia, Other forms of systemic lupus erythematosus, unspecified organ involvement status ([place]), Multiple episodes of deep venous thrombosis ([place]), Seronegative arthritis, Primary osteoarthritis of right hip, and Chronic pain syndrome were also pertinent to this visit.  Visit Diagnosis (New problems to examiner): 1. Rheumatoid arthritis involving multiple sites, unspecified rheumatoid factor presence ([place])   2. Fibromyalgia   3. Polyarthralgia   4. Other forms of systemic lupus erythematosus, unspecified organ involvement status ([place])   5. Multiple episodes of deep venous thrombosis ([facility])   6. Seronegative arthritis   7. Primary osteoarthritis of right hip   8. Chronic pain syndrome   General Recommendations: The pain condition that the patient suffers from is best treated with a multidisciplinary approach that involves an increase in physical activity to prevent de-conditioning and worsening of the pain cycle, as well as psychological counseling (formal and/or informal) to address the co-morbid psychological affects of pain. Treatment will often involve judicious use of pain medications and interventional procedures to decrease the pain, allowing the patient to participate in the physical activity that will ultimately produce long-lasting pain reductions.  The goal of the multidisciplinary approach is to return the patient to a higher level of overall function and to restore their ability to perform activities of daily living.  Patient is a 58 year old female with a history of chronic pain secondary to rheumatoid arthritis, lupus, fibromyalgia, osteoarthritis.  Patient moved from [place].  There she was being seen by rheumatology.  She was being managed on hydrocodone 7.5 mg  daily to twice daily as needed.  Majority of patient's pain is in her joints including wrist, elbow, shoulder, hips, knees.  She also has a diagnosis of osteopenia.  Patient's current medications include Plaquenil, Topamax, gabapentin 600 mg 3 times daily.  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  She states that she had an epidural for her labor and had a bad experience with it and is not interested in interventional procedures.  Patient is high risk to be off anticoagulation.  Patient has tried physical therapy in the past along with TENS unit, stretching.  She also complains of paresthesias in her hands and lower feet.  Patient also with a history of breast cancer.  To be considered for chronic opioid therapy, we will obtain baseline urine drug screen.  This should be negative for any controlled substances.  We will also refer the patient for psych eval for substance abuse disorder.  If no red flags/low risk, can consider taking patient on at her previous dose of hydrocodone 7.5 mg daily to twice daily as needed, quantity 30 to 45/month max.  Patient instructed to continue her gabapentin 600 mg 3 times a day and Topamax as prescribed.  Plan of Care (Initial workup plan)  Note: Please be advised that as per protocol, today's visit has been an evaluation only. We have not taken over the patient's controlled substance management.  Ordered Lab-work, Procedure(s), Referral(s), & Consult(s): Orders Placed This Encounter  Procedures  . Compliance Drug  Analysis, Ur  . Ambulatory referral to Psychology    Pharmacological management options:  Opioid Analgesics: The patient was informed that there is no guarantee that she would be a candidate for opioid analgesics. The decision will be made following CDC guidelines. This decision will be based on the results of diagnostic studies, as well as Ms. Thornberry risk profile.   Membrane stabilizer: To be determined at a later time  Muscle relaxant: To be determined at a later time  NSAID: Medically contraindicated  Other analgesic(s): To be determined at a later time   Interventional management options: Ms. Alcaide was informed that there is no guarantee that she would be a candidate for interventional therapies. The decision will be based on the results of diagnostic studies, as well as Ms. Campanella risk profile.  Procedure(s) under consideration:  Not applicable, patient on Pradaxa, high risk to be off given chronic DVT.   Provider-requested follow-up: Return in about 5 weeks (around 11/25/2018) for After Psychological evaluation.  Future Appointments  Date Time Provider [place]  11/07/2018  1:15 PM [facility] None  11/26/2018 11:45 AM Gillis Santa, MD [facility] None  01/02/2019  1:45 PM [facility] None  01/22/2019  2:15 PM [facility] None  01/22/2019  2:45 PM Earlie Server, MD [facility] None  02/27/2019  1:30 PM [facility] None    Primary Care Physician: Elby Beck, FNP Location: [facility] Note by: Gillis Santa, M.D, Date: 10/21/2018; Time: 8:17 AM  Patient Instructions  You have been referred to psych. Please follow through with this appointment prior to next visit to pain clinic.

## 2018-10-24 ENCOUNTER — Ambulatory Visit: Payer: Commercial Managed Care - HMO | Admitting: Student in an Organized Health Care Education/Training Program

## 2018-10-25 LAB — COMPLIANCE DRUG ANALYSIS, UR

## 2018-11-04 ENCOUNTER — Other Ambulatory Visit: Payer: Self-pay | Admitting: Family Medicine

## 2018-11-04 DIAGNOSIS — M25552 Pain in left hip: Secondary | ICD-10-CM

## 2018-11-04 NOTE — Telephone Encounter (Signed)
Spoke with patient to follow up on Flexeril refill request. Patient is still taking this medication but patient no longer comes to [facility] and she is actually seen Dr. Ouida Sills now. Declining refill and patient will also call pharmacy and let them know to resend.

## 2018-11-06 ENCOUNTER — Telehealth: Payer: Self-pay

## 2018-11-06 ENCOUNTER — Other Ambulatory Visit: Payer: Self-pay | Admitting: *Deleted

## 2018-11-06 ENCOUNTER — Other Ambulatory Visit: Payer: Self-pay

## 2018-11-06 NOTE — Telephone Encounter (Signed)
Per Telford Nab, RN secure chat "spoke with pt for screening priror to port flush tomorrow. Pt is asking if she can get labs drawn while she is here for port flush? she needs chromium and cobalt levels to be drawn and sent to Dr. Dorothyann Peng at [facility]. Please call pt to advise."    Per Dr. Tasia Catchings, she needs to gets these labs done at PCP if possible. I spoke to patient and pt is upset because they cannot draw labs at ortho or PCP because they do not access ports and she states "I don't have good veins thats why I have a port." She also states that they only was that [place] wil draw labs through port is to transfer to [facility]. Dr. Tasia Catchings notified.   Per Dr. Tasia Catchings, ok to proceed with labs here. Patient notified and states she will bring orders with her and give [place] a copy. Once labs result we will forward to Dr. Dorothyann Peng.

## 2018-11-07 ENCOUNTER — Inpatient Hospital Stay: Payer: Medicare Other | Attending: Oncology

## 2018-11-07 ENCOUNTER — Other Ambulatory Visit: Payer: Self-pay

## 2018-11-07 DIAGNOSIS — M1990 Unspecified osteoarthritis, unspecified site: Secondary | ICD-10-CM

## 2018-11-07 DIAGNOSIS — D0512 Intraductal carcinoma in situ of left breast: Secondary | ICD-10-CM | POA: Insufficient documentation

## 2018-11-07 DIAGNOSIS — Z95828 Presence of other vascular implants and grafts: Secondary | ICD-10-CM

## 2018-11-07 MED ORDER — HEPARIN SOD (PORK) LOCK FLUSH 100 UNIT/ML IV SOLN
500.0000 [IU] | Freq: Once | INTRAVENOUS | Status: AC
  Administered 2018-11-07: 500 [IU] via INTRAVENOUS

## 2018-11-07 MED ORDER — SODIUM CHLORIDE 0.9% FLUSH
10.0000 mL | Freq: Once | INTRAVENOUS | Status: AC
  Administered 2018-11-07: 10 mL via INTRAVENOUS
  Filled 2018-11-07: qty 10

## 2018-11-08 ENCOUNTER — Encounter: Payer: Self-pay | Admitting: Psychiatry

## 2018-11-08 ENCOUNTER — Ambulatory Visit (INDEPENDENT_AMBULATORY_CARE_PROVIDER_SITE_OTHER): Payer: Medicare Other | Admitting: Psychiatry

## 2018-11-08 ENCOUNTER — Other Ambulatory Visit: Payer: Self-pay

## 2018-11-08 DIAGNOSIS — Z7151 Drug abuse counseling and surveillance of drug abuser: Secondary | ICD-10-CM | POA: Diagnosis not present

## 2018-11-08 DIAGNOSIS — G4701 Insomnia due to medical condition: Secondary | ICD-10-CM

## 2018-11-08 DIAGNOSIS — Z008 Encounter for other general examination: Secondary | ICD-10-CM

## 2018-11-08 DIAGNOSIS — G894 Chronic pain syndrome: Secondary | ICD-10-CM

## 2018-11-08 LAB — MISC LABCORP TEST (SEND OUT)
Labcorp test code: 71506
Labcorp test code: 71522
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
## 2018-11-08 NOTE — Progress Notes (Signed)
Tc on  11-08-18 pt allergies were reviewed no changes, pt medications and pharmacy was reviewed and updated, pt medical and surgical hx review and no changes.  Vital were not done due to this was a phone visit.

## 2018-11-11 ENCOUNTER — Telehealth: Payer: Self-pay

## 2018-11-11 NOTE — Telephone Encounter (Signed)
Lab results for Cobalt and chromium faxed to Dr. Vernetta Honey office at [place].   Fax # [phone]

## 2018-11-18 ENCOUNTER — Encounter: Payer: Self-pay | Admitting: Student in an Organized Health Care Education/Training Program

## 2018-11-22 DIAGNOSIS — G43709 Chronic migraine without aura, not intractable, without status migrainosus: Secondary | ICD-10-CM | POA: Insufficient documentation

## 2018-11-26 ENCOUNTER — Other Ambulatory Visit: Payer: Self-pay

## 2018-11-26 ENCOUNTER — Encounter: Payer: Self-pay | Admitting: Student in an Organized Health Care Education/Training Program

## 2018-11-26 ENCOUNTER — Ambulatory Visit
Payer: Medicare Other | Attending: Student in an Organized Health Care Education/Training Program | Admitting: Student in an Organized Health Care Education/Training Program

## 2018-11-26 VITALS — Ht <= 58 in | Wt 190.0 lb

## 2018-11-26 DIAGNOSIS — M328 Other forms of systemic lupus erythematosus: Secondary | ICD-10-CM

## 2018-11-26 DIAGNOSIS — M2550 Pain in unspecified joint: Secondary | ICD-10-CM

## 2018-11-26 DIAGNOSIS — M797 Fibromyalgia: Secondary | ICD-10-CM | POA: Diagnosis not present

## 2018-11-26 DIAGNOSIS — G894 Chronic pain syndrome: Secondary | ICD-10-CM

## 2018-11-26 DIAGNOSIS — I82509 Chronic embolism and thrombosis of unspecified deep veins of unspecified lower extremity: Secondary | ICD-10-CM

## 2018-11-26 DIAGNOSIS — M1611 Unilateral primary osteoarthritis, right hip: Secondary | ICD-10-CM

## 2018-11-26 DIAGNOSIS — M069 Rheumatoid arthritis, unspecified: Secondary | ICD-10-CM | POA: Diagnosis not present

## 2018-11-26 DIAGNOSIS — M1380 Other specified arthritis, unspecified site: Secondary | ICD-10-CM

## 2018-11-26 MED ORDER — HYDROCODONE-ACETAMINOPHEN 7.5-325 MG PO TABS: 1.0000 | ORAL_TABLET | Freq: Two times a day (BID) | ORAL | 0 refills | Status: DC | PRN

## 2018-11-26 NOTE — Progress Notes (Signed)
Pain Management Virtual Encounter Note - Virtual Visit via Telephone Telehealth (real-time audio visits between healthcare provider and patient).  Patient's Phone No. & Preferred Pharmacy:  [phone] (home); [phone] (mobile); (Preferred) [phone] [email or web address]  [place], [place] [address] Phone: [phone] Fax: [phone]   Pre-screening note:  Our staff contacted Victoria [name] and offered her an "in person", "face-to-face" appointment versus a telephone encounter. She indicated preferring the telephone encounter, at this time.  Reason for Virtual Visit: COVID-19*  Social distancing based on CDC and AMA recommendations.   I contacted Dunellen Blas on 11/26/2018 at 1:45 PM via telephone and clearly identified myself as Gillis Santa, MD. I verified that I was speaking with the correct person using two identifiers (Name and date of birth: [birth year]).  Advanced Informed Consent I sought verbal advanced consent from [name] for virtual visit interactions. I informed Victoria [name] of possible security and privacy concerns, risks, and limitations associated with providing "not-in-person" medical evaluation and management services. I also informed Victoria [name] of the availability of "in-person" appointments. Finally, I informed her that there would be a charge for the virtual visit and that she could be  personally, fully or partially, financially responsible for it. Victoria [name] expressed understanding and agreed to proceed.   Historic Elements   [name] is a 58 y.o. year old, female patient evaluated today after her last encounter by our practice on 10/21/2018. Victoria [name]  has a past medical history of Asthma, Cancer ([place]), Collagen vascular disease ([place]), COPD (chronic obstructive pulmonary disease) ([place]), Diverticulitis, Fibromyalgia, GERD (gastroesophageal reflux disease), Hearing impaired  person, left, IBS (irritable bowel syndrome), Lupus ([place]), Mitral valve disorder, MS (multiple sclerosis) ([place]), Neuromuscular disorder ([place]), Osteoarthritis, Rheumatoid arthritis ([place]), and Seizures ([place]). She also  has a past surgical history that includes Breast surgery; Abdominal hysterectomy; Appendectomy; Cholecystectomy; Patent ductus arterious repair; Total hip arthroplasty (Right); Reconstruction tendon pulley hand (Left); Breast excisional biopsy (Left, 2017); and Breast excisional biopsy (Left, 2004). Victoria [name] has a current medication list which includes the following prescription(s): albuterol, atorvastatin, benzonatate, budesonide-formoterol, cyclobenzaprine, dabigatran, gabapentin, hydroxychloroquine, leflunomide, letrozole, omeprazole, potassium chloride, tiotropium bromide monohydrate, topiramate, trazodone, hydrocodone-acetaminophen, and hydrocodone-acetaminophen. She  reports that she quit smoking about 19 years ago. Her smoking use included cigarettes. She has a 30.00 pack-year smoking history. She has never used smokeless tobacco. She reports current alcohol use. She reports that she does not use drugs. Victoria [name] is allergic to sodium hypochlorite; codeine; flagyl [metronidazole]; and sulfa antibiotics.   HPI  I last saw her on 10/21/2018. She is being evaluated for medication management.  Please see initial clinic note.  Of note patient moved from [place], was seeing rheumatology there for her polyarthralgias, fibromyalgia, osteoarthritis.  She was managed on Norco 7.5 mg daily to twice daily as needed.  She is hoping to establish care here.  Patient has seen Dr. EAP PEN with psychiatry and has been deemed low risk for substance misuse abuse.  Patient's UDS was also appropriate.  No unexpected findings.  Today we will take her on for chronic opioid therapy at her previous opioid dose in [place] hydrocodone 7.5 mg quantity 45/month.  We will also mail her pain contract that she has been  instructed to sign and return back to [place].  Pharmacotherapy Assessment  Analgesic: Norco 7.5 mg BID PRN (max 45/month) MME/day: 7.5-15mg /day.   Monitoring: Pharmacotherapy: No side-effects or adverse reactions reported. [place] PMP: PDMP reviewed during  this encounter.       Compliance: No problems identified or detected. Plan: Refer to "POC".  Review of recent tests  CT Abdomen Pelvis W Contrast CLINICAL DATA:  BILATERAL groin pain, question BILATERAL groin lipomas  EXAM: CT ABDOMEN AND PELVIS WITH CONTRAST  TECHNIQUE: Multidetector CT imaging of the abdomen and pelvis was performed using the standard protocol following bolus administration of intravenous contrast. Sagittal and coronal MPR images reconstructed from axial data set.  CONTRAST:  123mL ISOVUE-300 IOPAMIDOL (ISOVUE-300) INJECTION 61% IV. Dilute oral contrast.  COMPARISON:  05/06/2018  FINDINGS: Lower chest: Lung bases clear  Hepatobiliary: Gallbladder surgically absent. Tiny nonspecific low-attenuation focus RIGHT lobe liver too small to characterize image 21. Remainder of liver normal appearance. No biliary dilatation.  Pancreas: Normal appearance  Spleen: Normal appearance  Adrenals/Urinary Tract: Adrenal glands, kidneys, ureters, and bladder normal appearance  Stomach/Bowel: Appendix surgically absent by history. Stool throughout colon. Stomach and bowel loops otherwise unremarkable.  Vascular/Lymphatic: Minimal atherosclerotic calcifications of abdominal aorta. IVC filter with 2 of the limbs penetrate beyond the IVC wall. No adenopathy. Scattered pelvic phleboliths.  Reproductive: Uterus surgically absent with nonvisualization of ovaries.  Other: No free air or free fluid. No acute phlegm a tori process. No hernia is identified. Specifically no evidence of inguinal hernia, mass or adenopathy.  Musculoskeletal: RIGHT hip prosthesis.  No acute osseous findings.  IMPRESSION: No acute  intra-abdominal or intrapelvic abnormalities.  2 limbs of an IVC filter penetrate beyond the IVC wall.  No evidence of inguinal mass, adenopathy, or hernia.  Electronically Signed   By: Lavonia Dana M.D.   On: 07/30/2018 08:19   Infusion on 11/07/2018  Component Date Value Ref Range Status  . [facility] test code 11/07/2018 [id number]   Final  . [facility] test name 11/07/2018 CHROMIUM, PLASMA   Final   Performed at [facility], [address] [place], [place] [address]  . Misc [facility] result 11/07/2018 COMMENT   Final   Comment: (NOTE) Test Ordered: [id number] Chromium, Plasma Chromium, Plasma               0.6              ug/L     BN     Reference Range: 0.1-2.1                               This test was developed and its performance characteristics determined by [facility]. It has not been cleared or approved by the Food and Drug Administration.                                Detection Limit = 0.1 Performed At: [facility] [place], [place] [id number] Rush Farmer MD [id number]   . [facility] test code 11/07/2018 [id number]   Final  . [facility] test name 11/07/2018 COBALT, PLASMA   Final   Performed at [facility], [address] [place], [place] [address]  . Misc [facility] result 11/07/2018 COMMENT   Final   Comment: (NOTE) Test Ordered: [id number] Cobalt, Plasma Cobalt, Plasma                 Note:            ug/L     BN     None Detected  Reference Range: 0.0-0.9                               This test was developed and its performance characteristics determined by [facility]. It has not been cleared or approved by the Food and Drug Administration.                       Occupational Exposure: BEI 1.0                                Detection Limit = 1.0 Performed At: [facility] [place], [place] [id number] Rush Farmer MD [id number]    Assessment  The primary encounter  diagnosis was Rheumatoid arthritis involving multiple sites, unspecified rheumatoid factor presence ([place]). Diagnoses of Fibromyalgia, Polyarthralgia, Other forms of systemic lupus erythematosus, unspecified organ involvement status ([place]), Multiple episodes of deep venous thrombosis ([place]), Seronegative arthritis, Primary osteoarthritis of right hip, and Chronic pain syndrome were also pertinent to this visit.  Please see initial clinic note.  Of note patient moved from [place], was seeing rheumatology there for her polyarthralgias, fibromyalgia, osteoarthritis.  She was managed on Norco 7.5 mg daily to twice daily as needed.  She is hoping to establish care here.  Patient has seen Dr. EAP PEN with psychiatry and has been deemed low risk for substance misuse abuse.  Patient's UDS was also appropriate.  No unexpected findings.  Today we will take her on for chronic opioid therapy at her previous opioid dose in [place] hydrocodone 7.5 mg quantity 45/month.  We will also mail her pain contract that she has been instructed to sign and return back to [place].  Plan of Care  I am having Carrizales Blas start on HYDROcodone-acetaminophen and HYDROcodone-acetaminophen. I am also having her maintain her budesonide-formoterol, albuterol, letrozole, traZODone, dabigatran, Tiotropium Bromide Monohydrate, leflunomide, benzonatate, hydroxychloroquine, omeprazole, cyclobenzaprine, Topiramate (TOPAMAX PO), gabapentin, potassium chloride, and atorvastatin.  Pharmacotherapy (Medications Ordered): Meds ordered this encounter  Medications  . HYDROcodone-acetaminophen (NORCO) 7.5-325 MG tablet    Sig: Take 1 tablet by mouth 2 (two) times daily as needed for up to 30 days for severe pain. Must last 30 days.    Dispense:  45 tablet    Refill:  0    [place] STOP ACT - Not applicable. Fill one day early if pharmacy is closed on scheduled refill date.  Mar[place] Kitchen HYDROcodone-acetaminophen (NORCO) 7.5-325 MG tablet    Sig: Take 1 tablet by mouth 2  (two) times daily as needed for up to 30 days for severe pain. Must last 30 days.    Dispense:  45 tablet    Refill:  0    [place] STOP ACT - Not applicable. Fill one day early if pharmacy is closed on scheduled refill date.   Orders:  No orders of the defined types were placed in this encounter.  Follow-up plan:   Return in about 8 weeks (around 01/21/2019) for Medication Management.   I discussed the assessment and treatment plan with the patient. The patient was provided an opportunity to ask questions and all were answered. The patient agreed with the plan and demonstrated an understanding of the instructions.  Patient advised to call back or seek an in-person evaluation if the symptoms or condition worsens.  Total duration of non-face-to-face encounter: 22 minutes.  Note by: Gillis Santa, MD Date: 11/26/2018;  Time: 1:45 PM  Disclaimer:  * Given the special circumstances of the COVID-19 pandemic, the federal government has announced that the Office for Civil Rights (OCR) will exercise its enforcement discretion and will not impose penalties on physicians using telehealth in the event of noncompliance with regulatory requirements under the [place] (HIPAA) in connection with the good faith provision of telehealth during the HBZJI-96 national public health emergency. ([place])

## 2018-11-28 ENCOUNTER — Ambulatory Visit: Payer: Self-pay | Admitting: Psychiatry

## 2018-11-28 ENCOUNTER — Encounter

## 2018-12-16 ENCOUNTER — Ambulatory Visit: Payer: Medicare Other | Attending: Neurology

## 2018-12-16 DIAGNOSIS — R5383 Other fatigue: Secondary | ICD-10-CM | POA: Insufficient documentation

## 2018-12-17 ENCOUNTER — Other Ambulatory Visit: Payer: Self-pay

## 2019-01-02 ENCOUNTER — Inpatient Hospital Stay: Payer: Medicare Other

## 2019-01-07 ENCOUNTER — Inpatient Hospital Stay: Payer: Medicare Other | Attending: Oncology

## 2019-01-07 ENCOUNTER — Other Ambulatory Visit: Payer: Self-pay

## 2019-01-07 DIAGNOSIS — D0512 Intraductal carcinoma in situ of left breast: Secondary | ICD-10-CM | POA: Diagnosis present

## 2019-01-07 DIAGNOSIS — Z86718 Personal history of other venous thrombosis and embolism: Secondary | ICD-10-CM | POA: Diagnosis not present

## 2019-01-07 DIAGNOSIS — M069 Rheumatoid arthritis, unspecified: Secondary | ICD-10-CM | POA: Insufficient documentation

## 2019-01-07 DIAGNOSIS — D696 Thrombocytopenia, unspecified: Secondary | ICD-10-CM | POA: Diagnosis not present

## 2019-01-07 DIAGNOSIS — Z87891 Personal history of nicotine dependence: Secondary | ICD-10-CM | POA: Diagnosis not present

## 2019-01-07 DIAGNOSIS — Z79811 Long term (current) use of aromatase inhibitors: Secondary | ICD-10-CM | POA: Diagnosis not present

## 2019-01-07 DIAGNOSIS — M818 Other osteoporosis without current pathological fracture: Secondary | ICD-10-CM | POA: Insufficient documentation

## 2019-01-07 DIAGNOSIS — R2232 Localized swelling, mass and lump, left upper limb: Secondary | ICD-10-CM | POA: Diagnosis not present

## 2019-01-07 DIAGNOSIS — Z95828 Presence of other vascular implants and grafts: Secondary | ICD-10-CM

## 2019-01-07 MED ORDER — SODIUM CHLORIDE 0.9% FLUSH
10.0000 mL | Freq: Once | INTRAVENOUS | Status: AC
  Administered 2019-01-07: 11:00:00 10 mL via INTRAVENOUS
  Filled 2019-01-07: qty 10

## 2019-01-07 MED ORDER — HEPARIN SOD (PORK) LOCK FLUSH 100 UNIT/ML IV SOLN
500.0000 [IU] | Freq: Once | INTRAVENOUS | Status: AC
  Administered 2019-01-07: 500 [IU] via INTRAVENOUS

## 2019-01-15 ENCOUNTER — Encounter: Payer: Self-pay | Admitting: Student in an Organized Health Care Education/Training Program

## 2019-01-16 ENCOUNTER — Other Ambulatory Visit: Payer: Self-pay

## 2019-01-16 ENCOUNTER — Ambulatory Visit
Payer: Medicare Other | Attending: Student in an Organized Health Care Education/Training Program | Admitting: Student in an Organized Health Care Education/Training Program

## 2019-01-16 ENCOUNTER — Telehealth: Payer: Self-pay | Admitting: *Deleted

## 2019-01-16 ENCOUNTER — Encounter: Payer: Self-pay | Admitting: Student in an Organized Health Care Education/Training Program

## 2019-01-16 ENCOUNTER — Other Ambulatory Visit: Payer: Self-pay | Admitting: *Deleted

## 2019-01-16 DIAGNOSIS — M797 Fibromyalgia: Secondary | ICD-10-CM | POA: Diagnosis not present

## 2019-01-16 DIAGNOSIS — M069 Rheumatoid arthritis, unspecified: Secondary | ICD-10-CM | POA: Diagnosis not present

## 2019-01-16 DIAGNOSIS — M2550 Pain in unspecified joint: Secondary | ICD-10-CM | POA: Diagnosis not present

## 2019-01-16 DIAGNOSIS — M1611 Unilateral primary osteoarthritis, right hip: Secondary | ICD-10-CM

## 2019-01-16 DIAGNOSIS — M5416 Radiculopathy, lumbar region: Secondary | ICD-10-CM | POA: Diagnosis not present

## 2019-01-16 DIAGNOSIS — I890 Lymphedema, not elsewhere classified: Secondary | ICD-10-CM | POA: Insufficient documentation

## 2019-01-16 DIAGNOSIS — G894 Chronic pain syndrome: Secondary | ICD-10-CM

## 2019-01-16 MED ORDER — HYDROCODONE-ACETAMINOPHEN 7.5-325 MG PO TABS: 1.0000 | ORAL_TABLET | Freq: Two times a day (BID) | ORAL | 0 refills | Status: DC | PRN

## 2019-01-16 NOTE — Progress Notes (Signed)
I am sending a fax to Dr. Ouida Sills requesting clearance to stop Pradaxa 5 days. Will she need prior approval for LESI?

## 2019-01-16 NOTE — Progress Notes (Signed)
Pain Management Virtual Encounter Note - Virtual Visit via Telephone Telehealth (real-time audio visits between healthcare provider and patient).   Patient's Phone No. & Preferred Pharmacy:  [phone] (home); [phone] (mobile); (Preferred) [phone] [email or web address]  [place], [place] [address] Phone: [phone] Fax: [phone]    Pre-screening note:  Our staff contacted Victoria [name] and offered her an "in person", "face-to-face" appointment versus a telephone encounter. She indicated preferring the telephone encounter, at this time.   Reason for Virtual Visit: COVID-19*  Social distancing based on CDC and AMA recommendations.   I contacted Victoria [name] on 01/16/2019 via telephone.      I clearly identified myself as Gillis Santa, MD. I verified that I was speaking with the correct person using two identifiers (Name: Victoria [name], and date of birth: Victoria [name], Victoria [name]).  Advanced Informed Consent I sought verbal advanced consent from Victoria [name] for virtual visit interactions. I informed Victoria [name] of possible security and privacy concerns, risks, and limitations associated with providing "not-in-person" medical evaluation and management services. I also informed Victoria [name] of the availability of "in-person" appointments. Finally, I informed her that there would be a charge for the virtual visit and that she could be  personally, fully or partially, financially responsible for it. Victoria [name] expressed understanding and agreed to proceed.   Historic Elements   Victoria [name] is a 58 y.o. year old, female patient evaluated today after her last encounter by our practice on 11/26/2018. Victoria [name]  has a past medical history of Asthma, Cancer ([place]), Collagen vascular disease ([place]), COPD (chronic obstructive pulmonary disease) ([place]), Diverticulitis, Fibromyalgia, GERD (gastroesophageal reflux disease),  Hearing impaired person, left, IBS (irritable bowel syndrome), Lupus ([place]), Mitral valve disorder, MS (multiple sclerosis) ([place]), Neuromuscular disorder ([place]), Osteoarthritis, Rheumatoid arthritis ([place]), and Seizures ([place]). She also  has a past surgical history that includes Breast surgery; Abdominal hysterectomy; Appendectomy; Cholecystectomy; Patent ductus arterious repair; Total hip arthroplasty (Right); Reconstruction tendon pulley hand (Left); Breast excisional biopsy (Left, 2017); and Breast excisional biopsy (Left, 2004). Victoria [name] has a current medication list which includes the following prescription(s): albuterol, atorvastatin, benzonatate, budesonide-formoterol, cyclobenzaprine, dabigatran, gabapentin, hydrocodone-acetaminophen, hydrocodone-acetaminophen, hydroxychloroquine, leflunomide, letrozole, omeprazole, potassium chloride, tiotropium bromide monohydrate, topiramate, trazodone, and torsemide. She  reports that she quit smoking about 19 years ago. Her smoking use included cigarettes. She has a 30.00 pack-year smoking history. She has never used smokeless tobacco. She reports current alcohol use. She reports that she does not use drugs. Victoria [name] is allergic to sodium hypochlorite; codeine; flagyl [metronidazole]; and sulfa antibiotics.   HPI  Today, she is being contacted for medication management.   Patient is having increased pain in her low back with radiation to her right lower extremity.  She states that it often radiates to her ankle and what she describes as a dermatomal fashion.  Patient is also having issues with right hip bursitis for which she is receiving a right hip injection upcoming.  We discussed right-sided interlaminar epidural steroid injection patient would like to proceed.  Patient is on Pradaxa and will need to stop this medication 5 days prior to her scheduled procedure.  We will contact Dr. Ouida Sills for cardiac clearance.   Patient continues multimodal pain regimen  as prescribed.  States that it provides pain relief and improvement in functional status to a moderate extent.   Pharmacotherapy Assessment   12/26/2018  1   11/26/2018  Hydrocodone-Acetamin 7.5-325  45.00 30 Bi Lat  [id number]   Gib (4800)   0  11.[name] MME  Medicare   [place]    Monitoring: Pharmacotherapy: No side-effects or adverse reactions reported. [place] PMP: PDMP reviewed during this encounter.       Compliance: No problems identified. Effectiveness: Clinically acceptable. Plan: Refer to "POC".  Pertinent Labs   SAFETY SCREENING Profile No results found for: [place], [place], STAPHAUREUS, MRSAPCR, HCVAB, HIV, PREGTESTUR Renal Function Lab Results  Component Value Date   BUN 18 06/04/2018   CREATININE 0.55 06/04/2018   GFRAA >60 06/04/2018   GFRNONAA >60 06/04/2018   Hepatic Function Lab Results  Component Value Date   AST 21 06/04/2018   ALT 24 06/04/2018   ALBUMIN 3.7 06/04/2018   UDS Summary  Date Value Ref Range Status  10/21/2018 FINAL  Final    Comment:    ==================================================================== TOXASSURE COMP DRUG ANALYSIS,UR ==================================================================== Test                             Result       Flag       Units Drug Present   Gabapentin                     PRESENT   Topiramate                     PRESENT   Cyclobenzaprine                PRESENT   Desmethylcyclobenzaprine       PRESENT    Desmethylcyclobenzaprine is an expected metabolite of    cyclobenzaprine.   Trazodone                      PRESENT   1,3 chlorophenyl piperazine    PRESENT    1,3-chlorophenyl piperazine is an expected metabolite of    trazodone. ==================================================================== Test                      Result    Flag   Units      Ref Range   Creatinine              127              mg/dL      >=20 ==================================================================== Declared  Medications:  Medication list was not provided. ==================================================================== For clinical consultation, please call [phone]. ====================================================================    Note: Above Lab results reviewed.  Recent imaging  SLEEP STUDY DOCUMENTS Ordered by an unspecified provider.  Assessment  The primary encounter diagnosis was Lumbar radiculopathy. Diagnoses of Rheumatoid arthritis involving multiple sites, unspecified rheumatoid factor presence ([place]), Fibromyalgia, Polyarthralgia, Primary osteoarthritis of right hip, and Chronic pain syndrome were also pertinent to this visit.  Plan of Care  I am having Victoria [name] start on HYDROcodone-acetaminophen. I am also having her maintain her budesonide-formoterol, albuterol, letrozole, traZODone, dabigatran, Tiotropium Bromide Monohydrate, leflunomide, benzonatate, hydroxychloroquine, omeprazole, cyclobenzaprine, Topiramate (TOPAMAX PO), gabapentin, potassium chloride, atorvastatin, torsemide, and HYDROcodone-acetaminophen.  Medication refill for 2 months below: Fill dates 01/25/2019, 02/24/2019.  Also schedule for right-sided lumbar epidural steroid injection.  Will need cardiac clearance from PCP, Dr. Frazier Richards to stop Pradaxa 5 days prior to scheduled procedure.  Pharmacotherapy (Medications Ordered): Meds ordered this encounter  Medications  . HYDROcodone-acetaminophen (NORCO) 7.5-325 MG tablet    Sig: Take 1 tablet by mouth 2 (two) times daily  as needed for up to 30 days for severe pain. Must last 30 days.    Dispense:  45 tablet    Refill:  0    [place] STOP ACT - Not applicable. Fill one day early if pharmacy is closed on scheduled refill date.  Marland Kitchen HYDROcodone-acetaminophen (NORCO) 7.5-325 MG tablet    Sig: Take 1 tablet by mouth 2 (two) times daily as needed for up to 30 days for severe pain. Must last 30 days.    Dispense:  45 tablet    Refill:  0    [place] STOP  ACT - Not applicable. Fill one day early if pharmacy is closed on scheduled refill date.   Orders:  Orders Placed This Encounter  Procedures  . Lumbar Epidural Injection    Standing Status:   Future    Standing Expiration Date:   02/15/2019    Scheduling Instructions:     Procedure: Interlaminar Lumbar Epidural Steroid injection (LESI)            Laterality: Midline     Sedation: Patient's choice.     Timeframe: ASAA     Will need cardiac clearance from PCP to stop Pradaxa 5 days prior to scheduled procedure.    Order Specific Question:   Where will this procedure be performed?    Answer:   [facility]   Follow-up plan:   -ASAA for L-ESI with sedation: Will need cardiac clearance from PCP Dr. Frazier Richards to stop Pradaxa 5 days prior to scheduled procedure -Medication management in 2 months   I discussed the assessment and treatment plan with the patient. The patient was provided an opportunity to ask questions and all were answered. The patient agreed with the plan and demonstrated an understanding of the instructions.  Patient advised to call back or seek an in-person evaluation if the symptoms or condition worsens.  Total duration of non-face-to-face encounter: 76minutes.  Note by: Gillis Santa, MD Date: 01/16/2019; Time: 11:51 AM  Note: This dictation was prepared with Dragon dictation. Any transcriptional errors that may result from this process are unintentional.  Disclaimer:  * Given the special circumstances of the COVID-19 pandemic, the federal government has announced that the Office for Civil Rights (OCR) will exercise its enforcement discretion and will not impose penalties on physicians using telehealth in the event of noncompliance with regulatory requirements under the [place] and [place] (HIPAA) in connection with the good faith provision of telehealth during the FIEPP-29 national public health emergency. ([place])

## 2019-01-16 NOTE — Telephone Encounter (Signed)
Spoke with Victoria [name], informed her that I will sent a fax to Dr. Tonette Bihari office, requesting clearance to stop Pradaxa 5 days. I also asked her to contact the office to do the same. I also instructed her on pre-procedure protocol. She verbalized understanding.

## 2019-01-16 NOTE — Telephone Encounter (Signed)
-----   Message from Gillis Santa, MD sent at 01/16/2019 11:52 AM EDT ----- Massie Kluver for L-ESI with sedation: Will need cardiac clearance from PCP Dr. Frazier Richards to stop Pradaxa 5 days prior to scheduled procedure

## 2019-01-16 NOTE — Telephone Encounter (Deleted)
-----   Message from Gillis Santa, MD sent at 01/16/2019 11:52 AM EDT ----- Massie Kluver for L-ESI with sedation: Will need cardiac clearance from PCP Dr. Frazier Richards to stop Pradaxa 5 days prior to scheduled procedure

## 2019-01-21 ENCOUNTER — Other Ambulatory Visit: Payer: Self-pay

## 2019-01-21 DIAGNOSIS — Z86000 Personal history of in-situ neoplasm of breast: Secondary | ICD-10-CM

## 2019-01-22 ENCOUNTER — Inpatient Hospital Stay: Payer: Medicare Other

## 2019-01-22 ENCOUNTER — Other Ambulatory Visit: Payer: Self-pay

## 2019-01-22 ENCOUNTER — Inpatient Hospital Stay (HOSPITAL_BASED_OUTPATIENT_CLINIC_OR_DEPARTMENT_OTHER): Payer: Medicare Other | Admitting: Oncology

## 2019-01-22 ENCOUNTER — Encounter: Payer: Self-pay | Admitting: Oncology

## 2019-01-22 VITALS — BP 154/89 | HR 80 | Temp 97.1°F | Ht <= 58 in | Wt 202.4 lb

## 2019-01-22 DIAGNOSIS — D0512 Intraductal carcinoma in situ of left breast: Secondary | ICD-10-CM

## 2019-01-22 DIAGNOSIS — D72819 Decreased white blood cell count, unspecified: Secondary | ICD-10-CM

## 2019-01-22 DIAGNOSIS — R2232 Localized swelling, mass and lump, left upper limb: Secondary | ICD-10-CM | POA: Diagnosis not present

## 2019-01-22 DIAGNOSIS — Z86718 Personal history of other venous thrombosis and embolism: Secondary | ICD-10-CM

## 2019-01-22 DIAGNOSIS — D696 Thrombocytopenia, unspecified: Secondary | ICD-10-CM

## 2019-01-22 DIAGNOSIS — C801 Malignant (primary) neoplasm, unspecified: Secondary | ICD-10-CM

## 2019-01-22 DIAGNOSIS — M818 Other osteoporosis without current pathological fracture: Secondary | ICD-10-CM

## 2019-01-22 DIAGNOSIS — Z86000 Personal history of in-situ neoplasm of breast: Secondary | ICD-10-CM

## 2019-01-22 DIAGNOSIS — M069 Rheumatoid arthritis, unspecified: Secondary | ICD-10-CM

## 2019-01-22 DIAGNOSIS — Z87891 Personal history of nicotine dependence: Secondary | ICD-10-CM

## 2019-01-22 DIAGNOSIS — Z79811 Long term (current) use of aromatase inhibitors: Secondary | ICD-10-CM

## 2019-01-22 LAB — COMPREHENSIVE METABOLIC PANEL
ALT: 24 U/L (ref 0–44)
AST: 20 U/L (ref 15–41)
Albumin: 3.8 g/dL (ref 3.5–5.0)
Alkaline Phosphatase: 67 U/L (ref 38–126)
Anion gap: 6 (ref 5–15)
BUN: 13 mg/dL (ref 6–20)
CO2: 22 mmol/L (ref 22–32)
Calcium: 8.8 mg/dL — ABNORMAL LOW (ref 8.9–10.3)
Chloride: 111 mmol/L (ref 98–111)
Creatinine, Ser: 0.68 mg/dL (ref 0.44–1.00)
GFR calc Af Amer: >60 mL/min (ref >60)
GFR calc non Af Amer: >60 mL/min (ref >60)
Glucose, Bld: 94 mg/dL (ref 70–99)
Potassium: 3.6 mmol/L (ref 3.5–5.1)
Sodium: 139 mmol/L (ref 135–145)
Total Bilirubin: 0.2 mg/dL — ABNORMAL LOW (ref 0.3–1.2)
Total Protein: 6.4 g/dL — ABNORMAL LOW (ref 6.5–8.1)

## 2019-01-22 LAB — CBC WITH DIFFERENTIAL/PLATELET
Abs Immature Granulocytes: 0 10*3/uL (ref 0.00–0.07)
Basophils Absolute: 0 10*3/uL (ref 0.0–0.1)
Basophils Relative: 1 %
Eosinophils Absolute: 0.1 10*3/uL (ref 0.0–0.5)
Eosinophils Relative: 2 %
HCT: 36.7 % (ref 36.0–46.0)
Hemoglobin: 11.9 g/dL — ABNORMAL LOW (ref 12.0–15.0)
Immature Granulocytes: 0 %
Lymphocytes Relative: 34 %
Lymphs Abs: 1 10*3/uL (ref 0.7–4.0)
MCH: 29.8 pg (ref 26.0–34.0)
MCHC: 32.4 g/dL (ref 30.0–36.0)
MCV: 92 fL (ref 80.0–100.0)
Monocytes Absolute: 0.3 10*3/uL (ref 0.1–1.0)
Monocytes Relative: 9 %
Neutro Abs: 1.6 10*3/uL — ABNORMAL LOW (ref 1.7–7.7)
Neutrophils Relative %: 54 %
Platelets: 97 10*3/uL — ABNORMAL LOW (ref 150–400)
RBC: 3.99 MIL/uL (ref 3.87–5.11)
RDW: 14 % (ref 11.5–15.5)
WBC: 3 10*3/uL — ABNORMAL LOW (ref 4.0–10.5)
nRBC: 0 % (ref 0.0–0.2)

## 2019-01-22 MED ORDER — HEPARIN SOD (PORK) LOCK FLUSH 100 UNIT/ML IV SOLN
500.0000 [IU] | Freq: Once | INTRAVENOUS | Status: AC
  Administered 2019-01-22: 500 [IU] via INTRAVENOUS

## 2019-01-22 MED ORDER — SODIUM CHLORIDE 0.9% FLUSH
10.0000 mL | INTRAVENOUS | Status: AC | PRN
  Administered 2019-01-22: 10 mL via INTRAVENOUS
  Filled 2019-01-22: qty 10

## 2019-01-22 NOTE — Progress Notes (Signed)
Patient stated that she noticed that she had a knot on her left axilla/breast for the past three weeks. Patient stated that she had not noticed it getting any bigger, but she started hurting from her left shoulder. Patient denied nipple discharge or skin discoloration. Patient's last mammogram was on 07/23/2018 and Bone Density was done on 07/23/2018.

## 2019-01-23 ENCOUNTER — Other Ambulatory Visit: Payer: Medicare Other

## 2019-01-23 DIAGNOSIS — D696 Thrombocytopenia, unspecified: Secondary | ICD-10-CM | POA: Insufficient documentation

## 2019-01-23 DIAGNOSIS — D72819 Decreased white blood cell count, unspecified: Secondary | ICD-10-CM | POA: Insufficient documentation

## 2019-01-23 NOTE — Progress Notes (Signed)
Hematology/Oncology follow up note [facility] Telephone:([phone]) [phone] [phone]   Patient Care Team: Kirk Ruths, MD as PCP - General (Internal Medicine)  REFERRING PROVIDER: Kirk Ruths, MD CHIEF COMPLAINTS/REASON FOR VISIT:  Evaluation of history of breast cancer  HISTORY OF PRESENTING ILLNESS:  Victoria [name] is a  58 y.o.  female with PMH listed below who was referred to me for evaluation of history of breast cancer Patient moved from [place]. Reports she was diagnosed with left breast cancer 2 years ago, hormone receptor positive.  Status post lumpectomy and radiation. Chemotherapy.  Patient is on letrozole since December 2017.  Tolerates well.  His previous oncologist [place] cancer specialist in [place], [place].  She also has history of rheumatoid arthritis/lupus follows up with Dr. Cydney Ok.  Recalls that she has had DEXA scan done about 2 years ago. Reports mammogram was done about 1 year ago.  Also report having left lower extremity blood clot 3 years ago, started on Eliquis for a month and developed pulmonary embolism.  Switch to Pradaxa.  She also had IVC filter.  After she moved to [place], she was temporarily off Pradaxa.  Currently back on anticoagulation. She presented to emergency room on 06/25/2018 for evaluation of right arm pain, complaining about developing knots right upper extremity.  # obtained previous oncology history from [facility]. Medical records were reviewed by me.  Patient was diagnosed with left breast DCIS in 2017, s/p lumpectomy and radiation.  Initially was started on Arimidex however cannot tolerate and was switched to Letrozole.   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  She has also established care with Rheumatology.   INTERVAL HISTORY Calli Bashor is a 58 y.o.  female who has above history reviewed by me today presents for follow up visit for management of history of DCIS.  She tolerates letrozole. Reports that she felt a knot under right armpit.  Slightly tender with palpation. DEXA was done on 07/23/2018 showed osteoporosis.  During previous visits we have discussed rationale of bisphosphonate.  In the recommend patient obtain dental clearance Patient has not obtain dental clearance yet. She is taking calcium and vitamin D   Review of Systems  Constitutional: Positive for fatigue. Negative for appetite change, chills and fever.  HENT:   Negative for hearing loss and voice change.   Eyes: Negative for eye problems.  Respiratory: Negative for chest tightness and cough.   Cardiovascular: Negative for chest pain.  Gastrointestinal: Negative for abdominal distention, abdominal pain and blood in stool.  Endocrine: Negative for hot flashes.  Genitourinary: Negative for difficulty urinating and frequency.   Musculoskeletal: Positive for arthralgias, back pain and myalgias.  Skin: Negative for itching and rash.  Neurological: Negative for extremity weakness.  Hematological: Negative for adenopathy.  Psychiatric/Behavioral: Negative for confusion.    MEDICAL HISTORY:  Past Medical History:  Diagnosis Date  . Asthma   . Cancer (HCC)    Breast Left, DCIS   . Collagen vascular disease ([place])   . COPD (chronic obstructive pulmonary disease) ([place])   . Diverticulitis   . Fibromyalgia   . GERD (gastroesophageal reflux disease)   . Hearing impaired person, left   . IBS (irritable bowel syndrome)   . Lupus ([place])   . Mitral valve disorder   . MS (multiple sclerosis) ([place])   . Neuromuscular disorder ([place])   .  Osteoarthritis   . Rheumatoid arthritis ([place])   . Seizures ([place])     SURGICAL HISTORY: Past Surgical History:  Procedure Laterality Date  . ABDOMINAL HYSTERECTOMY    . APPENDECTOMY    . BREAST EXCISIONAL BIOPSY Left 2017   lumpectomy  with radation  . BREAST EXCISIONAL BIOPSY Left 2004   neg surgical bx   . BREAST SURGERY    . CHOLECYSTECTOMY    . PATENT DUCTUS ARTERIOUS REPAIR    . RECONSTRUCTION TENDON PULLEY HAND Left   . TOTAL HIP ARTHROPLASTY Right     SOCIAL HISTORY: Social History   Socioeconomic History  . Marital status: Widowed    Spouse name: Not on file  . Number of children: 1  . Years of education: Not on file  . Highest education level: Some college, no degree  Occupational History  . Not on file  Social Needs  . Financial resource strain: Not hard at all  . Food insecurity    Worry: Never true    Inability: Never true  . Transportation needs    Medical: No    Non-medical: No  Tobacco Use  . Smoking status: Former Smoker    Packs/day: 2.00    Years: 15.00    Pack years: 30.00    Types: Cigarettes    Quit date: 06/27/1999    Years since quitting: 19.5  . Smokeless tobacco: Never Used  Substance and Sexual Activity  . Alcohol use: Yes    Frequency: Never    Comment: Rarely - approx 6 beers once a month  . Drug use: Never  . Sexual activity: Not Currently  Lifestyle  . Physical activity    Days per week: 0 days    Minutes per session: 0 min  . Stress: Not at all  Relationships  . Social Herbalist on phone: Not on file    Gets together: Not on file    Attends religious service: More than 4 times per year    Active member of club or organization: No    Attends meetings of clubs or organizations: Never    Relationship status: Widowed  . Intimate partner violence    Fear of current or ex partner: No    Emotionally abused: No    Physically abused: No    Forced sexual activity: No  Other Topics Concern  . Not on file  Social History Narrative  . Not on file    FAMILY HISTORY: Family History  Problem Relation Age of Onset  . COPD Mother   . Stroke Father   . Arthritis Brother   . Diabetes Brother   . Stroke Brother     ALLERGIES:  is allergic to sodium  hypochlorite; codeine; flagyl [metronidazole]; and sulfa antibiotics.  MEDICATIONS:  Current Outpatient Medications  Medication Sig Dispense Refill  . albuterol (PROVENTIL HFA;VENTOLIN HFA) 108 (90 Base) MCG/ACT inhaler Inhale 2 puffs into the lungs every 6 (six) hours as needed for wheezing or shortness of breath. 1 Inhaler 2  . atorvastatin (LIPITOR) 40 MG tablet Take 40 mg by mouth daily.    . budesonide-formoterol (SYMBICORT) 160-4.5 MCG/ACT inhaler Inhale 2 puffs into the lungs 2 (two) times daily. 1 Inhaler 12  . cyclobenzaprine (FLEXERIL) 10 MG tablet TAKE 1 TABLET BY MOUTH TWICE A DAY AS NEEDED FOR MUSCLE SPASMS 40 tablet 0  . dabigatran (PRADAXA) 150 MG CAPS capsule Take 1 capsule (150 mg total) by mouth 2 (two) times daily. 180 capsule 3  .  gabapentin (NEURONTIN) 300 MG capsule Take 600 mg by mouth 3 (three) times daily.    Derrill Memo ON 01/25/2019] HYDROcodone-acetaminophen (NORCO) 7.5-325 MG tablet Take 1 tablet by mouth 2 (two) times daily as needed for up to 30 days for severe pain. Must last 30 days. 45 tablet 0  . hydroxychloroquine (PLAQUENIL) 200 MG tablet Take 200 mg by mouth daily.    Marland Kitchen leflunomide (ARAVA) 20 MG tablet Take 20 mg by mouth daily.    Marland Kitchen letrozole (FEMARA) 2.5 MG tablet Take 1 tablet (2.5 mg total) by mouth daily. 90 tablet 3  . omeprazole (PRILOSEC) 40 MG capsule Take 1 capsule (40 mg total) by mouth 2 (two) times daily. 90 capsule 1  . potassium chloride (K-DUR) 10 MEQ tablet Take 10 mEq by mouth daily.    . Tiotropium Bromide Monohydrate (SPIRIVA RESPIMAT) 2.5 MCG/ACT AERS Inhale 2 puffs into the lungs every morning. 1 Inhaler 11  . Topiramate (TOPAMAX PO) Take 50 mg by mouth 2 (two) times daily.     Marland Kitchen torsemide (DEMADEX) 10 MG tablet     . traZODone (DESYREL) 50 MG tablet Take 3 tablets (150 mg total) by mouth at bedtime. 180 tablet 1  . benzonatate (TESSALON) 100 MG capsule Take 1 capsule (100 mg total) by mouth 3 (three) times daily as needed. (Patient not  taking: Reported on 01/22/2019) 30 capsule 0   No current facility-administered medications for this visit.    Facility-Administered Medications Ordered in Other Visits  Medication Dose Route Frequency Provider Last Rate Last Dose  . sodium chloride flush (NS) 0.9 % injection 10 mL  10 mL Intravenous PRN Earlie Server, MD   10 mL at 01/22/19 1503     PHYSICAL EXAMINATION: ECOG PERFORMANCE STATUS: 2 - Symptomatic, <50% confined to bed Vitals:   01/22/19 1517  BP: (!) 154/89  Pulse: 80  Temp: (!) 97.1 F (36.2 C)   Filed Weights   01/22/19 1517  Weight: 202 lb 6.4 oz (91.8 kg)    Physical Exam Constitutional:      General: She is not in acute distress.    Appearance: She is obese.     Comments: obese  HENT:     Head: Normocephalic and atraumatic.  Eyes:     General: No scleral icterus.    Pupils: Pupils are equal, round, and reactive to light.  Neck:     Musculoskeletal: Normal range of motion and neck supple.  Cardiovascular:     Rate and Rhythm: Normal rate and regular rhythm.     Heart sounds: Normal heart sounds.  Pulmonary:     Effort: Pulmonary effort is normal. No respiratory distress.     Breath sounds: No wheezing.  Abdominal:     General: Bowel sounds are normal. There is no distension.     Palpations: Abdomen is soft. There is no mass.     Tenderness: There is no abdominal tenderness.  Musculoskeletal: Normal range of motion.        General: No deformity.     Comments: Left groin tenderness soft tissue nodule.  Skin:    General: Skin is warm and dry.     Findings: No erythema or rash.  Neurological:     Mental Status: She is alert and oriented to person, place, and time.     Cranial Nerves: No cranial nerve deficit.     Coordination: Coordination normal.  Psychiatric:        Behavior: Behavior normal.  Thought Content: Thought content normal.   I do not feel any palpable breast bilaterally.  Left axillary area of concern ?  Soft tissue thickening.    LABORATORY DATA:  I have reviewed the data as listed Lab Results  Component Value Date   WBC 3.0 (L) 01/22/2019   HGB 11.9 (L) 01/22/2019   HCT 36.7 01/22/2019   MCV 92.0 01/22/2019   PLT 97 (L) 01/22/2019   Recent Labs    05/06/18 0051 06/04/18 1646 01/22/19 1415  NA 140 140 139  K 3.8 3.8 3.6  CL 108 106 111  CO2 26 23 22   GLUCOSE 107* 88 94  BUN 21* 18 13  CREATININE 0.56 0.55 0.68  CALCIUM 9.1 9.2 8.8*  GFRNONAA >60 >60 >60  GFRAA >60 >60 >60  PROT 6.4* 6.2* 6.4*  ALBUMIN 3.9 3.7 3.8  AST 24 21 20   ALT 26 24 24   ALKPHOS 69 62 67  BILITOT 0.4 0.4 0.2*   Iron/TIBC/Ferritin/ %Sat No results found for: IRON, TIBC, FERRITIN, IRONPCTSAT   RADIOGRAPHIC STUDIES: I have personally reviewed the radiological images as listed and agreed with the findings in the report. US venous upper right extremity 06/04/2018  No evidence of DVT within the right upper extremity 07/09/2018 CXR  Interval resolution of subsegmental atelectasis or interstitial edema. Mild chronic bronchitic changes. No acute pneumonia nor pulmonary edema. Thoracic aortic atherosclerosis  ASSESSMENT & PLAN:  1. History of ductal carcinoma in situ (DCIS) of breast   2. Mass of left axilla   3. Thrombocytopenia ([place])   4. Leukopenia, unspecified type    history of left DICS.  S/p lumpectomy and RT.  Currently on letrozole.  Tolerates well. Continue.  #Osteoporosis in the context of chronic aromatase inhibitor use. I discussed with patient the rationale and side effects of bisphosphonate. Recommend patient to obtain dental clearance.  #Left axillary nodule, questionable soft tissue Will obtain diagnostic unilateral mammogram with ultrasound of axillary  #History of DVT and PE, continue Pradaxa. #thrombocytopenia And leukopenia, unknown etiology. Drug-induced versus underlying bone marrow process. Repeat CBC,For the work up of patient's thrombocytopenia, I recommend checking CBC;CMP, LDH; smear  review, folate, Vitamin B12, hepatitis, HIV, ANA,  flowcytometry and monoclonal gammopathy workup. Will also check ultrasound of the abdomen.   #Chronic autoimmune disorders/rheumatoid arthritis/lupus: established care with Dr.Bock  She started on Leflunomide 20mg  daily since March 2020.  Also has been on Plaquenil 200mg    Orders Placed This Encounter  Procedures  . MM DIAG BREAST TOMO UNI LEFT    Standing Status:   Future    Standing Expiration Date:   01/22/2020    Order Specific Question:   Reason for Exam (SYMPTOM  OR DIAGNOSIS REQUIRED)    Answer:   knot/lump on axilla/left    Order Specific Question:   Is the patient pregnant?    Answer:   No    Order Specific Question:   Preferred imaging location?    Answer:   [facility]  . US Breast Limited Uni Left Inc Axilla    Standing Status:   Future    Standing Expiration Date:   03/23/2020    Order Specific Question:   Reason for Exam (SYMPTOM  OR DIAGNOSIS REQUIRED)    Answer:   left breast knot    Order Specific Question:   Preferred imaging location?    Answer:   [facility]    All questions were answered. The patient knows to call the clinic with any problems questions  or concerns.  Return of visit: 3 months.    Earlie Server, MD, PhD Hematology Oncology [facility] at [facility] Pager- [phone] 07/19/2018

## 2019-01-28 ENCOUNTER — Other Ambulatory Visit: Payer: Medicare Other

## 2019-01-31 ENCOUNTER — Other Ambulatory Visit: Payer: Self-pay

## 2019-01-31 ENCOUNTER — Other Ambulatory Visit
Admission: RE | Admit: 2019-01-31 | Discharge: 2019-01-31 | Disposition: A | Discharge status: Home / Self Care | Payer: Medicare Other | Source: Ambulatory Visit | Attending: Student in an Organized Health Care Education/Training Program | Admitting: Student in an Organized Health Care Education/Training Program

## 2019-01-31 DIAGNOSIS — Z1159 Encounter for screening for other viral diseases: Secondary | ICD-10-CM | POA: Insufficient documentation

## 2019-02-01 LAB — NOVEL CORONAVIRUS, NAA (HOSP ORDER, SEND-OUT TO REF LAB; TAT 18-24 HRS): SARS-CoV-2, NAA: NOT DETECTED

## 2019-02-05 ENCOUNTER — Other Ambulatory Visit: Payer: Self-pay

## 2019-02-05 ENCOUNTER — Encounter: Payer: Self-pay | Admitting: Student in an Organized Health Care Education/Training Program

## 2019-02-05 ENCOUNTER — Other Ambulatory Visit: Payer: Medicare Other

## 2019-02-05 ENCOUNTER — Ambulatory Visit: Admission: RE | Admit: 2019-02-05 | Payer: Medicare Other | Source: Ambulatory Visit

## 2019-02-05 ENCOUNTER — Ambulatory Visit
Payer: Medicare Other | Attending: Student in an Organized Health Care Education/Training Program | Admitting: Student in an Organized Health Care Education/Training Program

## 2019-02-05 VITALS — BP 118/62 | HR 75 | Temp 97.9°F | Resp 20 | Ht <= 58 in | Wt 190.0 lb

## 2019-02-05 DIAGNOSIS — M5416 Radiculopathy, lumbar region: Secondary | ICD-10-CM | POA: Insufficient documentation

## 2019-02-05 MED ORDER — ROPIVACAINE HCL 2 MG/ML IJ SOLN
1.0000 mL | Freq: Once | INTRAMUSCULAR | Status: DC
  Filled 2019-02-05: qty 10

## 2019-02-05 MED ORDER — IOHEXOL 180 MG/ML  SOLN: 10.0000 mL | Freq: Once | INTRAMUSCULAR | Status: DC

## 2019-02-05 MED ORDER — DEXAMETHASONE SODIUM PHOSPHATE 10 MG/ML IJ SOLN
10.0000 mg | Freq: Once | INTRAMUSCULAR | Status: DC
  Filled 2019-02-05: qty 1

## 2019-02-05 MED ORDER — DIAZEPAM 5 MG PO TABS
5.0000 mg | ORAL_TABLET | Freq: Once | ORAL | Status: AC
  Administered 2019-02-05: 10:00:00 5 mg via ORAL
  Filled 2019-02-05: qty 1

## 2019-02-05 MED ORDER — SODIUM CHLORIDE 0.9% FLUSH: 1.0000 mL | Freq: Once | INTRAVENOUS | Status: DC

## 2019-02-05 NOTE — Patient Instructions (Signed)
Please stop blood thinners 3-5 days prior to procedure.

## 2019-02-05 NOTE — Progress Notes (Signed)
Safety precautions to be maintained throughout the outpatient stay will include: orient to surroundings, keep bed in low position, maintain call bell within reach at all times, provide assistance with transfer out of bed and ambulation.  

## 2019-02-05 NOTE — Progress Notes (Signed)
Of note, patient was scheduled for lumbar epidural steroid injection.  She has very difficult IV access given her systemic lupus.  She does have a port in place.  We do not feel comfortable with accessing her port given risk of infection as well as procedure that does not require IV sedation.  We did give the patient 5 mg of p.o. Valium which was not effective.  She was still very anxious and tearful.  Furthermore the patient stopped her Pradaxa for only 2 days.  Her last dose was Monday morning.  Today is Wednesday.  I informed the patient that we will reschedule her procedure for the future when she is able to stop her Pradaxa for 3 days to minimize risk of lumbar hematoma.  Furthermore we will also consider increased dose of Valium at 10 mg p.o.  Patient in agreement with plan.

## 2019-02-06 ENCOUNTER — Other Ambulatory Visit: Payer: Medicare Other

## 2019-02-10 ENCOUNTER — Ambulatory Visit: Payer: Medicare Other | Attending: Internal Medicine | Admitting: Occupational Therapy

## 2019-02-10 ENCOUNTER — Other Ambulatory Visit: Payer: Self-pay

## 2019-02-10 ENCOUNTER — Encounter: Payer: Self-pay | Admitting: Occupational Therapy

## 2019-02-10 DIAGNOSIS — I890 Lymphedema, not elsewhere classified: Secondary | ICD-10-CM | POA: Diagnosis present

## 2019-02-10 NOTE — Patient Instructions (Signed)

## 2019-02-11 ENCOUNTER — Other Ambulatory Visit: Payer: Self-pay

## 2019-02-11 ENCOUNTER — Other Ambulatory Visit: Payer: Self-pay | Admitting: Oncology

## 2019-02-11 ENCOUNTER — Inpatient Hospital Stay: Payer: Medicare Other | Attending: Oncology | Admitting: *Deleted

## 2019-02-11 ENCOUNTER — Ambulatory Visit
Admission: RE | Admit: 2019-02-11 | Discharge: 2019-02-11 | Disposition: A | Discharge status: Home / Self Care | Payer: Medicare Other | Source: Ambulatory Visit | Attending: Oncology | Admitting: Oncology

## 2019-02-11 DIAGNOSIS — R2232 Localized swelling, mass and lump, left upper limb: Secondary | ICD-10-CM

## 2019-02-11 DIAGNOSIS — Z86000 Personal history of in-situ neoplasm of breast: Secondary | ICD-10-CM | POA: Diagnosis not present

## 2019-02-11 DIAGNOSIS — D72819 Decreased white blood cell count, unspecified: Secondary | ICD-10-CM

## 2019-02-11 DIAGNOSIS — Z95828 Presence of other vascular implants and grafts: Secondary | ICD-10-CM

## 2019-02-11 DIAGNOSIS — D696 Thrombocytopenia, unspecified: Secondary | ICD-10-CM

## 2019-02-11 DIAGNOSIS — Z9010 Acquired absence of unspecified breast and nipple: Secondary | ICD-10-CM | POA: Insufficient documentation

## 2019-02-11 DIAGNOSIS — M818 Other osteoporosis without current pathological fracture: Secondary | ICD-10-CM | POA: Insufficient documentation

## 2019-02-11 DIAGNOSIS — D0512 Intraductal carcinoma in situ of left breast: Secondary | ICD-10-CM | POA: Diagnosis present

## 2019-02-11 LAB — CBC WITH DIFFERENTIAL/PLATELET
Abs Immature Granulocytes: 0.01 10*3/uL (ref 0.00–0.07)
Basophils Absolute: 0 10*3/uL (ref 0.0–0.1)
Basophils Relative: 1 %
Eosinophils Absolute: 0 10*3/uL (ref 0.0–0.5)
Eosinophils Relative: 0 %
HCT: 37 % (ref 36.0–46.0)
Hemoglobin: 11.9 g/dL — ABNORMAL LOW (ref 12.0–15.0)
Immature Granulocytes: 0 %
Lymphocytes Relative: 25 %
Lymphs Abs: 0.8 10*3/uL (ref 0.7–4.0)
MCH: 29.7 pg (ref 26.0–34.0)
MCHC: 32.2 g/dL (ref 30.0–36.0)
MCV: 92.3 fL (ref 80.0–100.0)
Monocytes Absolute: 0.3 10*3/uL (ref 0.1–1.0)
Monocytes Relative: 9 %
Neutro Abs: 2 10*3/uL (ref 1.7–7.7)
Neutrophils Relative %: 65 %
Platelets: 133 10*3/uL — ABNORMAL LOW (ref 150–400)
RBC: 4.01 MIL/uL (ref 3.87–5.11)
RDW: 14.1 % (ref 11.5–15.5)
WBC: 3 10*3/uL — ABNORMAL LOW (ref 4.0–10.5)
nRBC: 0 % (ref 0.0–0.2)

## 2019-02-11 LAB — FOLATE: Folate: 9.2 ng/mL (ref >5.9)

## 2019-02-11 LAB — IMMATURE PLATELET FRACTION: Immature Platelet Fraction: 2.6 % (ref 1.2–8.6)

## 2019-02-11 LAB — TECHNOLOGIST SMEAR REVIEW

## 2019-02-11 LAB — VITAMIN B12: Vitamin B-12: 397 pg/mL (ref 180–914)

## 2019-02-11 MED ORDER — HEPARIN SOD (PORK) LOCK FLUSH 100 UNIT/ML IV SOLN
500.0000 [IU] | Freq: Once | INTRAVENOUS | Status: AC
  Administered 2019-02-11: 10:00:00 500 [IU] via INTRAVENOUS

## 2019-02-11 MED ORDER — SODIUM CHLORIDE 0.9% FLUSH
10.0000 mL | Freq: Once | INTRAVENOUS | Status: AC
  Administered 2019-02-11: 10 mL via INTRAVENOUS
  Filled 2019-02-11: qty 10

## 2019-02-11 NOTE — Therapy (Signed)
[facility] SERVICES [address] [place], [place], [address] Phone: [phone]   Fax:  [phone]  Occupational Therapy Evaluation  Patient Details  Name: Victoria [name] MRN: [id number] Date of Birth: 09/15/1960 Referring Provider (OT): Marlowe Sax, MD   Encounter Date: 02/10/2019  OT End of Session - 02/10/19 1109    Visit Number  1    Number of Visits  36    Date for OT Re-Evaluation  05/11/19    OT Start Time  1050    OT Stop Time  1200    OT Time Calculation (min)  70 min    Activity Tolerance  Patient tolerated treatment well;No increased pain    Behavior During Therapy  WFL for tasks assessed/performed       Past Medical History:  Diagnosis Date  . Asthma   . Cancer (HCC)    Breast Left, DCIS   . Collagen vascular disease ([place])   . COPD (chronic obstructive pulmonary disease) ([place])   . Diverticulitis   . Fibromyalgia   . GERD (gastroesophageal reflux disease)   . Hearing impaired person, left   . IBS (irritable bowel syndrome)   . Lupus ([place])   . Mitral valve disorder   . Victoria (multiple sclerosis) ([place])   . Neuromuscular disorder ([place])   . Osteoarthritis   . Personal history of chemotherapy   . Personal history of radiation therapy 2017   left   . Rheumatoid arthritis ([place])   . Seizures ([place])     Past Surgical History:  Procedure Laterality Date  . ABDOMINAL HYSTERECTOMY    . APPENDECTOMY    . BREAST EXCISIONAL BIOPSY Left 2017   lumpectomy with radation  . BREAST EXCISIONAL BIOPSY Left 2004   neg surgical bx   . BREAST SURGERY    . CHOLECYSTECTOMY    . PATENT DUCTUS ARTERIOUS REPAIR    . RECONSTRUCTION TENDON PULLEY HAND Left   . TOTAL HIP ARTHROPLASTY Right     There were no vitals filed for this visit.  Subjective Assessment - 02/10/19 1244    Subjective   Victoria [name] is referred to Occupational Therapy by Marlowe Sax, MD of [facility] for evaluation and  treatment of BLE lymphedema. Victoria [name] endorses positive family history for chronic bilateral leg swelling in her mother. Pt reports onset of leg swelling in 2002. She has not previously undergone Complete Decongestive Therapy (CDT) and she has not tolwerated elastic compression garments. Victoria [name]'s goals for OT and CDT are to reduce leg swelling, reduce associated leg discomfort and "keep the swelling from getting worse".    Pertinent History  Complex medical history includes several systemic, inflammatory conditions, including lupus, OA, RA, fibromyalgia. Complex medical history also includes additional contributing conditions including obesity,  hx recurrent DVT and single PE, hx LE cellulitis, asthma and COPD , Hx L breast Ca w SNLB, (Stage 0/ sub clinical LUE/LUQ lymphedema)  partial mastectomy and XRT (2017), Victoria, and R THA 2009.    Limitations  chronic B leg swelling and associated pain, difficulty walking, impaired transfers and functional mobility, decreased balance,, decreaased joint AROM 2/2 sweling at hips, knees ankles and toes ( difficulty bending to reach feet to perform LB bathing, skin and nail care,  and LB dressing) impaired activity tolerance  for activities requiring standing and/or walking > 20 minutes, (shopping, home management chores, cooking). Impaired body image. Impaired social participation    Repetition  Increases Symptoms    Special  Tests  + Stemmer sign B feet    Currently in Pain?  Yes    Pain Score  7     Pain Location  Back   neck, shoulders and scapulae, arms. palms of hands, knees, hips,   Pain Orientation  Right;Left    Pain Descriptors / Indicators  Tender;Sore;Pressure;Heaviness;Tightness;Tiring;Pins and needles;Other (Comment)   fullness   Pain Type  Chronic pain    Pain Onset  Other (comment)   chronic   Aggravating Factors   extended standing, walking , sitting, supine positioning    Pain Relieving Factors  medication    Effect of Pain on Daily Activities   BLE swelling and LE associated pain limits functional ambulation and transfers, impairs performance of basic and instrumental ADLs, limits productive activies and leisure pursuits, limits social participation, impairs body image    Multiple Pain Sites  Yes                  OT Treatments/Exercises (OP) - 02/11/19 0001      Transfers   Transfers  Sit to Stand;Stand to Sit    Sit to Stand  With upper extremity assist;From chair/3-in-1    Stand to Sit  With upper extremity assist      ADLs   Overall ADLs  leg swelling and pain limits LB bathing, skin and nail care, skin inspection, LB dressing, fitting street shoes, standing to bathe    LB Dressing  impaired LB dressing, difficulty fitting street shoes 2/2 leg swelling    Bathing  impaired LB bathing due to limited hip, knee AROM due to girth at joints    Functional Mobility  BLE/ BLQ swelling limits all transfers requiring leg lifting, including car transfers and also limits bed mobility.    Cooking  sits to prep food    2/2 leg pain and swelling    Home Maintenance  limited by BLE/BLQ pain and swelling    Driving  limited    Work  impaired by chronic pain and swelling    Leisure  impaired    ADL Education Given  Yes      Manual Therapy   Manual Therapy  Edema management            OT Education - 02/10/19 1105    Education Details  Provided Pt and family education regarding lymphatic structure and function, etiologies, onset patterns and stages of progression. Discussed  impact of obesity on lymphatic system function. Outlined Complete Decongestive Therapy (CDT)  as standard of care and provided in depth information regarding 4 primary components of both Intensive and Self Management Phases, including Manual Lymph Drainage (MLD), compression wrapping and garments, skin care, and therapeutic exercise.   Pilar Plate discussion of high burden of care and in this case, need for consistent , daily caregiver assistance for optimal  clinical outcome. Discussed  Importance of daily, ongoing LE self-care essential to retaining clinical gains and limiting progression.  Lastly, reviewed lymphedema precautions, including cellulitis risk and difficulty with wound healing. Provided printed Lymphedema Workbook for reference.    Person(s) Educated  Patient    Methods  Explanation;Demonstration;Handout    Comprehension  Verbalized understanding;Returned demonstration          OT Long Term Goals - 02/11/19 1835      OT LONG TERM GOAL #1   Title  Pt will demonstrate modified independance with lymphedema (LE) precautions and prevention strategies by identifying 6  items from list provided in LE Workbook  by  4th OT Rx visit to limit LE progression.    Baseline  Max A    Time  4   4th OT rx visit   Period  Days    Status  New    Target Date  --   4th OT rx visit     OT LONG TERM GOAL #2   Title  Pt will be able to apply  knee length, multi-layer, short stretch compression wraps using correct gradient techniques with mod CG assistance  to achieve optimal limb volume reduction, and to return affected limb , as closely as possible, to premorbid size and shape, to limit infection risk and to improve tissue integrity.    Baseline  dependent    Time  4    Period  Days    Status  New    Target Date  --   4th OT rx visit     OT LONG TERM GOAL #3   Title  Pt to achieve at least 10% limb volume reduction below knees bilaterally  during Intensive Phase CDT  to improve tissue elasticity needed for optimal lymphatic function and joint AROM, , to return limbs to premorbid size and shape, to improve functional transfers and mobility, and to decrease infection risk.    Baseline  Max A    Time  12    Period  Weeks    Status  New    Target Date  05/12/19      OT LONG TERM GOAL #4   Title  Pt will achieve  100% compliance with daily LE self-care program with mod  caregiver assistance throughout Intensive Phase CDT, including daily  skin  care, lymphatic pumping therex, simple self-MLD and prescribe compression to ensure optimal limb volume reduction, to reduce infection risk and to limit LE progression for optimal functional performance in all occupational domains.    Baseline  Max A    Time  12    Period  Weeks    Status  New    Target Date  05/12/19      OT LONG TERM GOAL #5   Title  Pt will demonstrate modified independence using assistive devices and extra time to don and doff properly fitting, custom compression garments/devices for optimal LE self-management over time.    Baseline  Max A    Time  12    Period  Weeks    Status  New    Target Date  05/12/19      Long Term Additional Goals   Additional Long Term Goals  Yes      OT LONG TERM GOAL #6   Title  Pt will retain clinical BLE limb volume reductions over 6 months following intensive phase CDT ( management phase) with mod A from CG and by Pt consistently following all home program components daily to limit LE progression and further functional decline.    Baseline  Dependent    Time  6    Period  Months    Status  New    Target Date  08/10/19            Plan - 02/11/19 1832    Clinical Impression Statement  Zanasia Hickson presents with severe, stage III, progressive lipedema (type 3) and moderate, stage II, secondary lipo-lymphedema.  Lipidema is defined by the disproportionate and symmetrical accumulation of fat in the lower extremities and lower body quadrants accompanied by complaints of orthostatic edema. In keeping with lipedema, Victoria. Cass feet are  largely spared, fat presents in a "pantaloon-like" distribution with shelf at the ankles, and Pt c/o easy bruising, leg pain and tenderness, Victoria. Derk, in addition to lipo-lymphedema, also presents with obesity; however.  the narrow definition of obesity does not address the ratio of fat to lean body mass and the distribution of fat on the lower body and legs. In her case. Lipedema is sometimes  confused with lymphedema. Lymphedema may develop when the lymphatic system can no longer provide adequate drainage of tissues as subcutaneous fat deposition progresse. Other conditions contributing to leg swelling, pain and inflammation in this case include systemic lupus, arthritis, fibromyalgia and RA. Pt also presents with Stage 0, (subclinical) LUE/LUQ lymphedema due to breast cancer treatment. Pt was educated regarding differential diagnoses during our visit.  Functionally, BLE / BLQ swelling and associated pain limits Victoria. Leuthold functional ambulation and transfers, basic and instrumental ADLs performance, leisure pursuits and productive activities. Lipo-lymphedema limits her social participation and contributes to impaired body image. Although the lipedema side of a lipo-lymphedema diagnosis does not typically respond well to CDT, the lymphedema side may respond well. In any case Victoria. Sorrels will benefit from skilled Occupational Therapy for both intensive and self-management phase Complete Decongestive Therapy (CDT) to reduce and control limb swelling, to improve tissue integrity, and to limit lymphedema progression, to reduce recurrent infection risk. She should be fit with custom compression garments that support painful structures, reduce leg fatigue and improve ambulation and mobility. Without skilled OT for CDT chronic lipo-lymphedema will progress and further functional decline is expected.    OT Occupational Profile and History  Comprehensive Assessment- Review of records and extensive additional review of physical, cognitive, psychosocial history related to current functional performance    Occupational performance deficits (Please refer to evaluation for details):  ADL's;IADL's;Social Participation;Leisure;Work;Play    Body Structure / Function / Physical Skills  ADL;Decreased knowledge of precautions;Flexibility;ROM;Balance;Decreased knowledge of use of DME;Mobility;Edema;Skin  integrity;Obesity;Strength;IADL    Psychosocial Skills  Coping Strategies;Habits    Rehab Potential  Good    Clinical Decision Making  Multiple treatment options, significant modification of task necessary    Comorbidities Affecting Occupational Performance:  Presence of comorbidities impacting occupational performance    Comorbidities impacting occupational performance description:  see SUBJECTIVE    Modification or Assistance to Complete Evaluation   Min-Moderate modification of tasks or assist with assess necessary to complete eval    OT Frequency  3x / week    OT Duration  12 weeks   and PRN to address bilateral involvement-one leg at a time to limit falls risk   OT Treatment/Interventions  Self-care/ADL training;Therapeutic exercise;Manual lymph drainage;Coping strategies training;Other (comment);Patient/family education;Compression bandaging;Therapeutic activities;Manual Therapy;DME and/or AE instruction    Plan  CDT: manua lymphatic drainage (MLD, skin care, lymphatic pumping therex, gradient compression wraps and compression garments / devices)    Consulted and Agree with Plan of Care  Patient       Patient will benefit from skilled therapeutic intervention in order to improve the following deficits and impairments:   Body Structure / Function / Physical Skills: ADL, Decreased knowledge of precautions, Flexibility, ROM, Balance, Decreased knowledge of use of DME, Mobility, Edema, Skin integrity, Obesity, Strength, IADL   Psychosocial Skills: Coping Strategies, Habits   Visit Diagnosis: 1. Lymphedema, not elsewhere classified       Problem List Patient Active Problem List   Diagnosis Date Noted  . Thrombocytopenia ([place]) 01/23/2019  . Leukopenia 01/23/2019  . Lymphedema of both lower extremities 01/16/2019  .  Polyarthralgia 10/14/2018  . Headache disorder 10/14/2018  . Chronic daily headache 10/11/2018  . COPD (chronic obstructive pulmonary disease) ([place]) 10/11/2018  .  Thoracic aortic atherosclerosis ([place]) 10/11/2018  . Multiple episodes of deep venous thrombosis ([place]) 09/17/2018  . IBS (irritable bowel syndrome) 09/17/2018  . Healthcare maintenance 09/17/2018  . Primary osteoarthritis of right hip 09/03/2018  . Presence of right hip implant 09/03/2018  . Morbid obesity with BMI of 45.0-49.9, adult ([place]) 08/23/2018  . Shortness of breath 08/22/2018  . History of ductal carcinoma in situ (DCIS) of breast 07/20/2018  . Aromatase inhibitor use 07/20/2018  . Abnormal findings on auscultation 07/16/2018  . Encounter for long-term (current) use of high-risk medication 07/16/2018  . Fibromyalgia 07/16/2018  . Other forms of systemic lupus erythematosus ([place]) 07/16/2018  . Other osteoporosis without current pathological fracture 07/16/2018  . Seronegative arthritis 07/16/2018    Andrey Spearman, [place], OTR/L, [facility] 02/11/19 6:46 PM  [place] [facility] SERVICES [address] [place], [place], [address] Phone: [phone]   Fax:  [phone]  Name: Victoria [name] MRN: [id number] Date of Birth: 28-Mar-1961

## 2019-02-12 ENCOUNTER — Other Ambulatory Visit: Payer: Medicare Other

## 2019-02-12 ENCOUNTER — Other Ambulatory Visit: Admission: RE | Admit: 2019-02-12 | Payer: Medicare Other | Source: Ambulatory Visit

## 2019-02-13 ENCOUNTER — Other Ambulatory Visit: Payer: Self-pay

## 2019-02-13 ENCOUNTER — Ambulatory Visit: Payer: Medicare Other | Admitting: Occupational Therapy

## 2019-02-13 DIAGNOSIS — I890 Lymphedema, not elsewhere classified: Secondary | ICD-10-CM | POA: Diagnosis not present

## 2019-02-13 NOTE — Therapy (Signed)
[place] MAIN [facility] SERVICES [address] [place], [place], [address] Phone: [phone]   Fax:  [phone]  Occupational Therapy Treatment  Patient Details  Name: Victoria [name] MRN: [id number] Date of Birth: 1960-09-20 Referring Provider (OT): Marlowe Sax, MD   Encounter Date: 02/13/2019  OT End of Session - 02/13/19 1457    Visit Number  2    Number of Visits  36    Date for OT Re-Evaluation  05/11/19    OT Start Time  1125    OT Stop Time  1230    OT Time Calculation (min)  65 min    Activity Tolerance  Patient tolerated treatment well;No increased pain    Behavior During Therapy  WFL for tasks assessed/performed       Past Medical History:  Diagnosis Date  . Asthma   . Cancer (HCC)    Breast Left, DCIS   . Collagen vascular disease ([place])   . COPD (chronic obstructive pulmonary disease) ([place])   . Diverticulitis   . Fibromyalgia   . GERD (gastroesophageal reflux disease)   . Hearing impaired person, left   . IBS (irritable bowel syndrome)   . Lupus ([place])   . Mitral valve disorder   . MS (multiple sclerosis) ([place])   . Neuromuscular disorder ([place])   . Osteoarthritis   . Personal history of chemotherapy   . Personal history of radiation therapy 2017   left   . Rheumatoid arthritis ([place])   . Seizures ([place])     Past Surgical History:  Procedure Laterality Date  . ABDOMINAL HYSTERECTOMY    . APPENDECTOMY    . BREAST EXCISIONAL BIOPSY Left 2017   lumpectomy with radation  . BREAST EXCISIONAL BIOPSY Left 2004   neg surgical bx   . BREAST SURGERY    . CHOLECYSTECTOMY    . PATENT DUCTUS ARTERIOUS REPAIR    . RECONSTRUCTION TENDON PULLEY HAND Left   . TOTAL HIP ARTHROPLASTY Right     There were no vitals filed for this visit.  Subjective Assessment - 02/13/19 1130    Subjective   [name] presents to OT visit 2/ 36 to address BLE lipo-lymphedema. Pt has no new complaints since initial evaluation 2 days  ago. She reports 6/10 leg pain, on average today with most tender area at "shelf"  at distal legs and  ankles.    Pertinent History  Complex medical history includes several systemic, inflammatory conditions, including lupus, OA, RA, fibromyalgia. Complex medical history also includes additional contributing conditions including obesity,  hx recurrent DVT and single PE, hx LE cellulitis, asthma and COPD , Hx L breast Ca w SNLB, (Stage 0/ sub clinical LUE/LUQ lymphedema)  partial mastectomy and XRT (2017), MS, and R THA 2009.    Limitations  chronic B leg swelling and associated pain, difficulty walking, impaired transfers and functional mobility, decreased balance,, decreaased joint AROM 2/2 sweling at hips, knees ankles and toes ( difficulty bending to reach feet to perform LB bathing, skin and nail care,  and LB dressing) impaired activity tolerance  for activities requiring standing and/or walking > 20 minutes, (shopping, home management chores, cooking). Impaired body image. Impaired social participation    Repetition  Increases Symptoms    Special Tests  + Stemmer sign B feet    Pain Score  6     Pain Location  Leg    Pain Orientation  Right;Left    Pain Descriptors / Indicators  Pressure;Tender;Sore;Tiring;Tightness  Pain Type  Chronic pain    Pain Onset  Other (comment)   chronic   Pain Frequency  Intermittent   increases w/ walking, standing, dependent sitting         LYMPHEDEMA/ONCOLOGY QUESTIONNAIRE - 02/13/19 1452      Lymphedema Assessments   Lymphedema Assessments  Lower extremities      Right Lower Extremity Lymphedema   Other  RLE A-D volume = 3848.98 ml. E-G volume = 9560.11 ml. RLE A=G volume = 13409.09 ml.      Left Lower Extremity Lymphedema   Other  LLE A-D volume = 4691.53 ml. LLE E-G volume = 10063.17 ml. LLE A-G volume = 14754.7 ml.    Other  Limb volume differential (LVD) for ankle to tibial tuberosity segment (A-D) measures 21.89%, L leg > R leg. LVD for knee  to thigh (E-G) segment measures 5.26%, with L thigh > than R. Full LE limb volume (A-G) LVD measures 10.30%, LLE>RLE.              OT Treatments/Exercises (OP) - 02/13/19 0001      ADLs   ADL Education Given  Yes      Manual Therapy   Manual Therapy  Edema management;Compression Bandaging    Edema Management  initial BLE comparative limb volumetrics    Compression Bandaging  12 and 15 cm wide x 5 meter short stretch wrapps applied over single layer knee length cotton stockinett then 0.04 mm thick RosidaL FOAM, USING CIRCUMFERENTIAL GRADIENT TECHNIQUES.             OT Education - 02/13/19 1134    Education Details  Provided Pt and family education regarding lymphatic structure and function, etiologies, onset patterns and stages of progression. Discussed  impact of obesity on lymphatic system function. Outlined Complete Decongestive Therapy (CDT)  as standard of care and provided in depth information regarding 4 primary components of both Intensive and Self Management Phases, including Manual Lymph Drainage (MLD), compression wrapping and garments, skin care, and therapeutic exercise.   Pilar Plate discussion of high burden of care and in this case, need for consistent , daily caregiver assistance for optimal clinical outcome. Discussed  Importance of daily, ongoing LE self-care essential to retaining clinical gains and limiting progression.  Lastly, reviewed lymphedema precautions, including cellulitis risk and difficulty with wound healing. Provided printed Lymphedema Workbook for reference.    Person(s) Educated  Patient    Methods  Explanation;Demonstration;Handout    Comprehension  Verbalized understanding;Returned demonstration          OT Long Term Goals - 02/11/19 1835      OT LONG TERM GOAL #1   Title  Pt will demonstrate modified independance with lymphedema (LE) precautions and prevention strategies by identifying 6  items from list provided in LE Workbook  by 4th OT Rx  visit to limit LE progression.    Baseline  Max A    Time  4   4th OT rx visit   Period  Days    Status  New    Target Date  --   4th OT rx visit     OT LONG TERM GOAL #2   Title  Pt will be able to apply  knee length, multi-layer, short stretch compression wraps using correct gradient techniques with mod CG assistance  to achieve optimal limb volume reduction, and to return affected limb , as closely as possible, to premorbid size and shape, to limit infection risk and to improve tissue  integrity.    Baseline  dependent    Time  4    Period  Days    Status  New    Target Date  --   4th OT rx visit     OT LONG TERM GOAL #3   Title  Pt to achieve at least 10% limb volume reduction below knees bilaterally  during Intensive Phase CDT  to improve tissue elasticity needed for optimal lymphatic function and joint AROM, , to return limbs to premorbid size and shape, to improve functional transfers and mobility, and to decrease infection risk.    Baseline  Max A    Time  12    Period  Weeks    Status  New    Target Date  05/12/19      OT LONG TERM GOAL #4   Title  Pt will achieve  100% compliance with daily LE self-care program with mod  caregiver assistance throughout Intensive Phase CDT, including daily  skin care, lymphatic pumping therex, simple self-MLD and prescribe compression to ensure optimal limb volume reduction, to reduce infection risk and to limit LE progression for optimal functional performance in all occupational domains.    Baseline  Max A    Time  12    Period  Weeks    Status  New    Target Date  05/12/19      OT LONG TERM GOAL #5   Title  Pt will demonstrate modified independence using assistive devices and extra time to don and doff properly fitting, custom compression garments/devices for optimal LE self-management over time.    Baseline  Max A    Time  12    Period  Weeks    Status  New    Target Date  05/12/19      Long Term Additional Goals   Additional  Long Term Goals  Yes      OT LONG TERM GOAL #6   Title  Pt will retain clinical BLE limb volume reductions over 6 months following intensive phase CDT ( management phase) with mod A from CG and by Pt consistently following all home program components daily to limit LE progression and further functional decline.    Baseline  Dependent    Time  6    Period  Months    Status  New    Target Date  08/10/19            Plan - 02/13/19 1458    Clinical Impression Statement  Abigail Butts completed nitial OT visit for CDT without increased pain or other complaint. Despite being  R dominant, initial cmparative limv volumetrics reveal LLE is greater than LLE in volume for all segments. Limb volume differential (LVD) for ankle to tibial tuberosity segment (A-D) measures 21.89%, L leg > R leg. LVD for knee to thigh (E-G) segment measures 5.26%, with L thigh > than R. Full LE limb volume (A-G) LVD measures 10.30%, LLE>RLE.Marland Kitchen Applied compression wraps to knee length only to RLE as requested as Pt reports she has more pain/discomfort tyipically in the RLE than the LLE. Pt agrees to try to leave wraps in place for next 24 hours. Sjhe verbalized understanding precautions and will remove wraps if she experiences SOB at rest, or atypical SOB, or acute pain and/ or swelling.    OT Occupational Profile and History  Comprehensive Assessment- Review of records and extensive additional review of physical, cognitive, psychosocial history related to current functional performance  Occupational performance deficits (Please refer to evaluation for details):  ADL's;IADL's;Social Participation;Leisure;Work;Play    Body Structure / Function / Physical Skills  ADL;Decreased knowledge of precautions;Flexibility;ROM;Balance;Decreased knowledge of use of DME;Mobility;Edema;Skin integrity;Obesity;Strength;IADL    Psychosocial Skills  Coping Strategies;Habits    Rehab Potential  Good    Clinical Decision Making  Multiple treatment  options, significant modification of task necessary    Comorbidities Affecting Occupational Performance:  Presence of comorbidities impacting occupational performance    Comorbidities impacting occupational performance description:  see SUBJECTIVE    Modification or Assistance to Complete Evaluation   Min-Moderate modification of tasks or assist with assess necessary to complete eval    OT Frequency  3x / week    OT Duration  12 weeks   and PRN to address bilateral involvement-one leg at a time to limit falls risk   OT Treatment/Interventions  Self-care/ADL training;Therapeutic exercise;Manual lymph drainage;Coping strategies training;Other (comment);Patient/family education;Compression bandaging;Therapeutic activities;Manual Therapy;DME and/or AE instruction    Plan  CDT: manua lymphatic drainage (MLD, skin care, lymphatic pumping therex, gradient compression wraps and compression garments / devices)    Consulted and Agree with Plan of Care  Patient       Patient will benefit from skilled therapeutic intervention in order to improve the following deficits and impairments:   Body Structure / Function / Physical Skills: ADL, Decreased knowledge of precautions, Flexibility, ROM, Balance, Decreased knowledge of use of DME, Mobility, Edema, Skin integrity, Obesity, Strength, IADL   Psychosocial Skills: Coping Strategies, Habits   Visit Diagnosis: 1. Lymphedema, not elsewhere classified       Problem List Patient Active Problem List   Diagnosis Date Noted  . Thrombocytopenia ([place]) 01/23/2019  . Leukopenia 01/23/2019  . Lymphedema of both lower extremities 01/16/2019  . Polyarthralgia 10/14/2018  . Headache disorder 10/14/2018  . Chronic daily headache 10/11/2018  . COPD (chronic obstructive pulmonary disease) ([place]) 10/11/2018  . Thoracic aortic atherosclerosis ([place]) 10/11/2018  . Multiple episodes of deep venous thrombosis ([place]) 09/17/2018  . IBS (irritable bowel syndrome) 09/17/2018   . Healthcare maintenance 09/17/2018  . Primary osteoarthritis of right hip 09/03/2018  . Presence of right hip implant 09/03/2018  . Morbid obesity with BMI of 45.0-49.9, adult ([place]) 08/23/2018  . Shortness of breath 08/22/2018  . History of ductal carcinoma in situ (DCIS) of breast 07/20/2018  . Aromatase inhibitor use 07/20/2018  . Abnormal findings on auscultation 07/16/2018  . Encounter for long-term (current) use of high-risk medication 07/16/2018  . Fibromyalgia 07/16/2018  . Other forms of systemic lupus erythematosus ([place]) 07/16/2018  . Other osteoporosis without current pathological fracture 07/16/2018  . Seronegative arthritis 07/16/2018    Andrey Spearman, MS, OTR/L, [facility] 02/13/19 3:03 PM  [place] MAIN [facility] SERVICES [address] [place], [place], [address] Phone: [phone]   Fax:  [phone]  Name: Alizia Greif MRN: [id number] Date of Birth: 11/23/1960

## 2019-02-13 NOTE — Patient Instructions (Signed)

## 2019-02-17 ENCOUNTER — Ambulatory Visit: Payer: Medicare Other | Admitting: Student in an Organized Health Care Education/Training Program

## 2019-02-19 ENCOUNTER — Ambulatory Visit: Payer: Medicare Other | Admitting: Occupational Therapy

## 2019-02-19 ENCOUNTER — Other Ambulatory Visit: Payer: Self-pay

## 2019-02-19 DIAGNOSIS — I890 Lymphedema, not elsewhere classified: Secondary | ICD-10-CM

## 2019-02-19 NOTE — Therapy (Signed)
Victoria [name] [address] [place], [place], [address] Phone: [phone]   Fax:  [phone]  Occupational Therapy Treatment  Patient Details  Name: Victoria [name] MRN: [id number] Date of Birth: 1961-05-20 Referring Provider (OT): Victoria Sax, MD   Encounter Date: 02/19/2019  OT End of Session - 02/19/19 1026    Visit Number  3    Number of Visits  36    Date for OT Re-Evaluation  05/11/19    OT Start Time  0921    OT Stop Time  1025    OT Time Calculation (min)  64 min    Activity Tolerance  Patient tolerated treatment well;No increased pain    Behavior During Therapy  WFL for tasks assessed/performed       Past Medical History:  Diagnosis Date  . Asthma   . Cancer (HCC)    Breast Left, DCIS   . Collagen vascular disease ([place])   . COPD (chronic obstructive pulmonary disease) ([place])   . Diverticulitis   . Fibromyalgia   . GERD (gastroesophageal reflux disease)   . Hearing impaired person, left   . IBS (irritable bowel syndrome)   . Lupus ([place])   . Mitral valve disorder   . MS (multiple sclerosis) ([place])   . Neuromuscular disorder ([place])   . Osteoarthritis   . Personal history of chemotherapy   . Personal history of radiation therapy 2017   left   . Rheumatoid arthritis ([place])   . Seizures ([place])     Past Surgical History:  Procedure Laterality Date  . ABDOMINAL HYSTERECTOMY    . APPENDECTOMY    . BREAST EXCISIONAL BIOPSY Left 2017   lumpectomy with radation  . BREAST EXCISIONAL BIOPSY Left 2004   neg surgical bx   . BREAST SURGERY    . CHOLECYSTECTOMY    . PATENT DUCTUS ARTERIOUS REPAIR    . RECONSTRUCTION TENDON PULLEY HAND Left   . TOTAL HIP ARTHROPLASTY Right     There were no vitals filed for this visit.  Subjective Assessment - 02/19/19 0930    Subjective   Victoria [name] presents to OT visit 3/ 36 to address BLE lipo-lymphedema. Pt reports 7/10 pain "all over, even in my legs". Pt  reports she was able to tolerate compression wraps to the LLE below the knee without difficulty. "I kept on until Sunday."    Pertinent History  Complex medical history includes several systemic, inflammatory conditions, including lupus, OA, RA, fibromyalgia. Complex medical history also includes additional contributing conditions including obesity,  hx recurrent DVT and single PE, hx LE cellulitis, asthma and COPD , Hx L breast Ca w SNLB, (Stage 0/ sub clinical LUE/LUQ lymphedema)  partial mastectomy and XRT (2017), MS, and R THA 2009.    Limitations  chronic B leg swelling and associated pain, difficulty walking, impaired transfers and functional mobility, decreased balance,, decreaased joint AROM 2/2 sweling at hips, knees ankles and toes ( difficulty bending to reach feet to perform LB bathing, skin and nail care,  and LB dressing) impaired activity tolerance  for activities requiring standing and/or walking > 20 minutes, (shopping, home management chores, cooking). Impaired body image. Impaired social participation    Repetition  Increases Symptoms    Special Tests  + Stemmer sign B feet    Pain Onset  Other (comment)   chronic                  OT Treatments/Exercises (OP) -  02/19/19 0001      ADLs   ADL Education Given  Yes  (Pended)       Manual Therapy   Manual Therapy  Edema management;Compression Bandaging  (Pended)     Compression Bandaging  12 and 15 cm wide x 5 meter short stretch wrapps applied over single layer knee length cotton stockinett then 0.04 mm thick RosidaL FOAM, USING CIRCUMFERENTIAL GRADIENT TECHNIQUES.  (Pended)              OT Education - 02/19/19 1025    Education Details  Pt edu for applying short stretch compression wraps from foot to tibial tuberosity    Person(s) Educated  Patient    Methods  Explanation;Demonstration;Handout    Comprehension  Verbalized understanding;Returned demonstration;Need further instruction          OT Long  Term Goals - 02/11/19 1835      OT LONG TERM GOAL #1   Title  Pt will demonstrate modified independance with lymphedema (LE) precautions and prevention strategies by identifying 6  items from list provided in LE Workbook  by 4th OT Rx visit to limit LE progression.    Baseline  Max A    Time  4   4th OT rx visit   Period  Days    Status  New    Target Date  --   4th OT rx visit     OT LONG TERM GOAL #2   Title  Pt will be able to apply  knee length, multi-layer, short stretch compression wraps using correct gradient techniques with mod CG assistance  to achieve optimal limb volume reduction, and to return affected limb , as closely as possible, to premorbid size and shape, to limit infection risk and to improve tissue integrity.    Baseline  dependent    Time  4    Period  Days    Status  New    Target Date  --   4th OT rx visit     OT LONG TERM GOAL #3   Title  Pt to achieve at least 10% limb volume reduction below knees bilaterally  during Intensive Phase CDT  to improve tissue elasticity needed for optimal lymphatic function and joint AROM, , to return limbs to premorbid size and shape, to improve functional transfers and mobility, and to decrease infection risk.    Baseline  Max A    Time  12    Period  Weeks    Status  New    Target Date  05/12/19      OT LONG TERM GOAL #4   Title  Pt will achieve  100% compliance with daily LE self-care program with mod  caregiver assistance throughout Intensive Phase CDT, including daily  skin care, lymphatic pumping therex, simple self-MLD and prescribe compression to ensure optimal limb volume reduction, to reduce infection risk and to limit LE progression for optimal functional performance in all occupational domains.    Baseline  Max A    Time  12    Period  Weeks    Status  New    Target Date  05/12/19      OT LONG TERM GOAL #5   Title  Pt will demonstrate modified independence using assistive devices and extra time to don and doff  properly fitting, custom compression garments/devices for optimal LE self-management over time.    Baseline  Max A    Time  12    Period  Weeks  Status  New    Target Date  05/12/19      Long Term Additional Goals   Additional Long Term Goals  Yes      OT LONG TERM GOAL #6   Title  Pt will retain clinical BLE limb volume reductions over 6 months following intensive phase CDT ( management phase) with mod A from CG and by Pt consistently following all home program components daily to limit LE progression and further functional decline.    Baseline  Dependent    Time  6    Period  Months    Status  New    Target Date  08/10/19            Plan - 02/19/19 1027    Clinical Impression Statement  Emphasis of todays visit was on self care education for multi layer compression wrapping below the knee to LLE. Pt demonstrates good memorty for gradient techniques, but body habitus and short stature limits her ability to reach foot and distal leg to control  bandage tension. Pt issued 2nd set of wraps. She agrees with plan to perform diligent skin care and  rewrap daily during visit interval.    OT Occupational Profile and History  Comprehensive Assessment- Review of records and extensive additional review of physical, cognitive, psychosocial history related to current functional performance    Occupational performance deficits (Please refer to evaluation for details):  ADL's;IADL's;Social Participation;Leisure;Work;Play    Body Structure / Function / Physical Skills  ADL;Decreased knowledge of precautions;Flexibility;ROM;Balance;Decreased knowledge of use of DME;Mobility;Edema;Skin integrity;Obesity;Strength;IADL    Psychosocial Skills  Coping Strategies;Habits    Rehab Potential  Good    Clinical Decision Making  Multiple treatment options, significant modification of task necessary    Comorbidities Affecting Occupational Performance:  Presence of comorbidities impacting occupational  performance    Comorbidities impacting occupational performance description:  see SUBJECTIVE    Modification or Assistance to Complete Evaluation   Min-Moderate modification of tasks or assist with assess necessary to complete eval    OT Frequency  3x / week    OT Duration  12 weeks   and PRN to address bilateral involvement-one leg at a time to limit falls risk   OT Treatment/Interventions  Self-care/ADL training;Therapeutic exercise;Manual lymph drainage;Coping strategies training;Other (comment);Patient/family education;Compression bandaging;Therapeutic activities;Manual Therapy;DME and/or AE instruction    Plan  CDT: manua lymphatic drainage (MLD, skin care, lymphatic pumping therex, gradient compression wraps and compression garments / devices)    Consulted and Agree with Plan of Care  Patient       Patient will benefit from skilled therapeutic intervention in order to improve the following deficits and impairments:   Body Structure / Function / Physical Skills: ADL, Decreased knowledge of precautions, Flexibility, ROM, Balance, Decreased knowledge of use of DME, Mobility, Edema, Skin integrity, Obesity, Strength, IADL   Psychosocial Skills: Coping Strategies, Habits   Visit Diagnosis: 1. Lymphedema, not elsewhere classified       Problem List Patient Active Problem List   Diagnosis Date Noted  . Thrombocytopenia ([place]) 01/23/2019  . Leukopenia 01/23/2019  . Lymphedema of both lower extremities 01/16/2019  . Polyarthralgia 10/14/2018  . Headache disorder 10/14/2018  . Chronic daily headache 10/11/2018  . COPD (chronic obstructive pulmonary disease) ([place]) 10/11/2018  . Thoracic aortic atherosclerosis ([place]) 10/11/2018  . Multiple episodes of deep venous thrombosis ([place]) 09/17/2018  . IBS (irritable bowel syndrome) 09/17/2018  . Healthcare maintenance 09/17/2018  . Primary osteoarthritis of right hip 09/03/2018  . Presence  of right hip implant 09/03/2018  . Morbid obesity  with BMI of 45.0-49.9, adult ([place]) 08/23/2018  . Shortness of breath 08/22/2018  . History of ductal carcinoma in situ (DCIS) of breast 07/20/2018  . Aromatase inhibitor use 07/20/2018  . Abnormal findings on auscultation 07/16/2018  . Encounter for long-term (current) use of high-risk medication 07/16/2018  . Fibromyalgia 07/16/2018  . Other forms of systemic lupus erythematosus ([place]) 07/16/2018  . Other osteoporosis without current pathological fracture 07/16/2018  . Seronegative arthritis 07/16/2018    Andrey Spearman, MS, OTR/L, [facility] 02/19/19 10:30 AM  [place] MAIN [name] [address] [place], [place], [address] Phone: [phone]   Fax:  [phone]  Name: Victoria [name] MRN: [id number] Date of Birth: [birth year]

## 2019-02-20 ENCOUNTER — Encounter: Payer: Medicare Other | Admitting: Occupational Therapy

## 2019-02-24 ENCOUNTER — Ambulatory Visit: Payer: Medicare Other | Admitting: Occupational Therapy

## 2019-02-24 ENCOUNTER — Other Ambulatory Visit: Payer: Self-pay

## 2019-02-24 DIAGNOSIS — I890 Lymphedema, not elsewhere classified: Secondary | ICD-10-CM

## 2019-02-25 NOTE — Therapy (Signed)
[place] MAIN [facility] SERVICES [address] [place], [place], [address] Phone: [phone]   Fax:  [phone]  Occupational Therapy Treatment  Patient Details  Name: Victoria [name] MRN: [id number] Date of Birth: April 14, 1961 Referring Provider (OT): Marlowe Sax, MD   Encounter Date: 02/24/2019  OT End of Session - 02/24/19 1329    Visit Number  4    Number of Visits  36    Date for OT Re-Evaluation  05/11/19    OT Start Time  0115    Activity Tolerance  Patient tolerated treatment well;No increased pain    Behavior During Therapy  WFL for tasks assessed/performed       Past Medical History:  Diagnosis Date  . Asthma   . Cancer (HCC)    Breast Left, DCIS   . Collagen vascular disease ([place])   . COPD (chronic obstructive pulmonary disease) ([place])   . Diverticulitis   . Fibromyalgia   . GERD (gastroesophageal reflux disease)   . Hearing impaired person, left   . IBS (irritable bowel syndrome)   . Lupus ([place])   . Mitral valve disorder   . MS (multiple sclerosis) ([place])   . Neuromuscular disorder ([place])   . Osteoarthritis   . Personal history of chemotherapy   . Personal history of radiation therapy 2017   left   . Rheumatoid arthritis ([place])   . Seizures ([place])     Past Surgical History:  Procedure Laterality Date  . ABDOMINAL HYSTERECTOMY    . APPENDECTOMY    . BREAST EXCISIONAL BIOPSY Left 2017   lumpectomy with radation  . BREAST EXCISIONAL BIOPSY Left 2004   neg surgical bx   . BREAST SURGERY    . CHOLECYSTECTOMY    . PATENT DUCTUS ARTERIOUS REPAIR    . RECONSTRUCTION TENDON PULLEY HAND Left   . TOTAL HIP ARTHROPLASTY Right     There were no vitals filed for this visit.  Subjective Assessment - 02/24/19 1324    Subjective   Victoria [name] presents to OT visit 5/ 36 to address BLE lipo-lymphedema. Pt reports 7/10 pain "all over, even in my legs". Pt reports she was able to tolerate compression wraps to the LLE  below the knee without difficulty. "I kept on until Sunday."    Pertinent History  Complex medical history includes several systemic, inflammatory conditions, including lupus, OA, RA, fibromyalgia. Complex medical history also includes additional contributing conditions including obesity,  hx recurrent DVT and single PE, hx LE cellulitis, asthma and COPD , Hx L breast Ca w SNLB, (Stage 0/ sub clinical LUE/LUQ lymphedema)  partial mastectomy and XRT (2017), MS, and R THA 2009.    Limitations  chronic B leg swelling and associated pain, difficulty walking, impaired transfers and functional mobility, decreased balance,, decreaased joint AROM 2/2 sweling at hips, knees ankles and toes ( difficulty bending to reach feet to perform LB bathing, skin and nail care,  and LB dressing) impaired activity tolerance  for activities requiring standing and/or walking > 20 minutes, (shopping, home management chores, cooking). Impaired body image. Impaired social participation    Repetition  Increases Symptoms    Special Tests  + Stemmer sign B feet    Pain Onset  Other (comment)   chronic                          OT Education - 02/24/19 1329    Education Details  Continued skilled Pt/caregiver  education  And LE ADL training throughout visit for lymphedema self care/ home program, including compression wrapping, compression garment and device wear/care, lymphatic pumping ther ex, simple self-MLD, and skin care. Discussed progress towards goals.    Person(s) Educated  Patient    Methods  Explanation;Demonstration;Handout    Comprehension  Verbalized understanding;Returned demonstration;Need further instruction          OT Long Term Goals - 02/25/19 1019      OT LONG TERM GOAL #1   Title  Pt will demonstrate modified independance with lymphedema (LE) precautions and prevention strategies by identifying 6  items from list provided in LE Workbook  by 4th OT Rx visit to limit LE progression.     Baseline  Max A MET 02/23/19    Time  4   4th OT rx visit   Period  Days    Status  Achieved      OT LONG TERM GOAL #2   Title  Pt will be able to apply  knee length, multi-layer, short stretch compression wraps using correct gradient techniques with mod CG assistance  to achieve optimal limb volume reduction, and to return affected limb , as closely as possible, to premorbid size and shape, to limit infection risk and to improve tissue integrity.    Baseline  dependent 02/23/19 GOAL MET    Time  4    Period  Days    Status  Achieved      OT LONG TERM GOAL #3   Title  Pt to achieve at least 10% limb volume reduction below knees bilaterally  during Intensive Phase CDT  to improve tissue elasticity needed for optimal lymphatic function and joint AROM, , to return limbs to premorbid size and shape, to improve functional transfers and mobility, and to decrease infection risk.    Baseline  Max A    Time  12    Period  Weeks    Status  New      OT LONG TERM GOAL #4   Title  Pt will achieve  100% compliance with daily LE self-care program with mod  caregiver assistance throughout Intensive Phase CDT, including daily  skin care, lymphatic pumping therex, simple self-MLD and prescribe compression to ensure optimal limb volume reduction, to reduce infection risk and to limit LE progression for optimal functional performance in all occupational domains.    Baseline  Max A    Time  12    Period  Weeks    Status  New      OT LONG TERM GOAL #5   Title  Pt will demonstrate modified independence using assistive devices and extra time to don and doff properly fitting, custom compression garments/devices for optimal LE self-management over time.    Baseline  Max A    Time  12    Period  Weeks    Status  New      OT LONG TERM GOAL #6   Title  Pt will retain clinical BLE limb volume reductions over 6 months following intensive phase CDT ( management phase) with mod A from CG and by Pt consistently  following all home program components daily to limit LE progression and further functional decline.    Baseline  Dependent    Time  6    Period  Months    Status  New            Plan - 02/25/19 1016    Clinical Impression Statement  Pt  rewrapped daily durnig visit interval. She generally sed   correct techniques, but we reviewd all again today to refine skills. Commenced MLE today utilizing short neck sequence, diaphragmatic breathing to activate deep abdominal pathways and functional inguinal LN. Pt reported analgesic effect after manual therapy. Reapplied compression wraps. Productive session. Pt fully engaged and positive re progress to date. Cont as per POC.    OT Occupational Profile and History  Comprehensive Assessment- Review of records and extensive additional review of physical, cognitive, psychosocial history related to current functional performance    Occupational performance deficits (Please refer to evaluation for details):  ADL's;IADL's;Social Participation;Leisure;Work;Play    Body Structure / Function / Physical Skills  ADL;Decreased knowledge of precautions;Flexibility;ROM;Balance;Decreased knowledge of use of DME;Mobility;Edema;Skin integrity;Obesity;Strength;IADL    Psychosocial Skills  Coping Strategies;Habits    Rehab Potential  Good    Clinical Decision Making  Multiple treatment options, significant modification of task necessary    Comorbidities Affecting Occupational Performance:  Presence of comorbidities impacting occupational performance    Comorbidities impacting occupational performance description:  see SUBJECTIVE    Modification or Assistance to Complete Evaluation   Min-Moderate modification of tasks or assist with assess necessary to complete eval    OT Frequency  3x / week    OT Duration  12 weeks   and PRN to address bilateral involvement-one leg at a time to limit falls risk   OT Treatment/Interventions  Self-care/ADL training;Therapeutic  exercise;Manual lymph drainage;Coping strategies training;Other (comment);Patient/family education;Compression bandaging;Therapeutic activities;Manual Therapy;DME and/or AE instruction    Plan  CDT: manua lymphatic drainage (MLD, skin care, lymphatic pumping therex, gradient compression wraps and compression garments / devices)    Consulted and Agree with Plan of Care  Patient       Patient will benefit from skilled therapeutic intervention in order to improve the following deficits and impairments:   Body Structure / Function / Physical Skills: ADL, Decreased knowledge of precautions, Flexibility, ROM, Balance, Decreased knowledge of use of DME, Mobility, Edema, Skin integrity, Obesity, Strength, IADL   Psychosocial Skills: Coping Strategies, Habits   Visit Diagnosis: 1. Lymphedema, not elsewhere classified       Problem List Patient Active Problem List   Diagnosis Date Noted  . Thrombocytopenia ([place]) 01/23/2019  . Leukopenia 01/23/2019  . Lymphedema of both lower extremities 01/16/2019  . Polyarthralgia 10/14/2018  . Headache disorder 10/14/2018  . Chronic daily headache 10/11/2018  . COPD (chronic obstructive pulmonary disease) ([place]) 10/11/2018  . Thoracic aortic atherosclerosis ([place]) 10/11/2018  . Multiple episodes of deep venous thrombosis ([place]) 09/17/2018  . IBS (irritable bowel syndrome) 09/17/2018  . Healthcare maintenance 09/17/2018  . Primary osteoarthritis of right hip 09/03/2018  . Presence of right hip implant 09/03/2018  . Morbid obesity with BMI of 45.0-49.9, adult ([place]) 08/23/2018  . Shortness of breath 08/22/2018  . History of ductal carcinoma in situ (DCIS) of breast 07/20/2018  . Aromatase inhibitor use 07/20/2018  . Abnormal findings on auscultation 07/16/2018  . Encounter for long-term (current) use of high-risk medication 07/16/2018  . Fibromyalgia 07/16/2018  . Other forms of systemic lupus erythematosus ([place]) 07/16/2018  . Other osteoporosis without  current pathological fracture 07/16/2018  . Seronegative arthritis 07/16/2018    Andrey Spearman, MS, OTR/L, [facility] 02/25/19 10:22 AM  [place] MAIN [facility] SERVICES [address] [place], [place], [address] Phone: [phone]   Fax:  [phone]  Name: Victoria [name] MRN: [id number] Date of Birth: 10/22/1960

## 2019-02-26 ENCOUNTER — Ambulatory Visit: Payer: Medicare Other | Admitting: Occupational Therapy

## 2019-02-26 ENCOUNTER — Other Ambulatory Visit: Payer: Self-pay

## 2019-02-26 DIAGNOSIS — I890 Lymphedema, not elsewhere classified: Secondary | ICD-10-CM | POA: Diagnosis not present

## 2019-02-26 NOTE — Therapy (Signed)
[place] MAIN [facility] SERVICES [address] [place], [place], [address] Phone: [phone]   Fax:  [phone]  Occupational Therapy Treatment  Patient Details  Name: Victoria [name] MRN: [id number] Date of Birth: [birth year] Referring Provider (OT): Marlowe Sax, MD   Encounter Date: 02/26/2019  OT End of Session - 02/26/19 1333    Visit Number  5    Number of Visits  36    Date for OT Re-Evaluation  05/11/19    OT Start Time  1025    OT Stop Time  1120    OT Time Calculation (min)  55 min    Activity Tolerance  Patient tolerated treatment well;No increased pain    Behavior During Therapy  WFL for tasks assessed/performed       Past Medical History:  Diagnosis Date  . Asthma   . Cancer (HCC)    Breast Left, DCIS   . Collagen vascular disease ([place])   . COPD (chronic obstructive pulmonary disease) ([place])   . Diverticulitis   . Fibromyalgia   . GERD (gastroesophageal reflux disease)   . Hearing impaired person, left   . IBS (irritable bowel syndrome)   . Lupus ([place])   . Mitral valve disorder   . MS (multiple sclerosis) ([place])   . Neuromuscular disorder ([place])   . Osteoarthritis   . Personal history of chemotherapy   . Personal history of radiation therapy 2017   left   . Rheumatoid arthritis ([place])   . Seizures ([place])     Past Surgical History:  Procedure Laterality Date  . ABDOMINAL HYSTERECTOMY    . APPENDECTOMY    . BREAST EXCISIONAL BIOPSY Left 2017   lumpectomy with radation  . BREAST EXCISIONAL BIOPSY Left 2004   neg surgical bx   . BREAST SURGERY    . CHOLECYSTECTOMY    . PATENT DUCTUS ARTERIOUS REPAIR    . RECONSTRUCTION TENDON PULLEY HAND Left   . TOTAL HIP ARTHROPLASTY Right     There were no vitals filed for this visit.  Subjective Assessment - 02/26/19 1030    Subjective   Victoria [name] presents to OT visit 6/ 36 to address BLE lipo-lymphedema. Pt reports 7/10 pain "I was hurting really bad last  night. I took some pain pills and they helped. "    Pertinent History  Complex medical history includes several systemic, inflammatory conditions, including lupus, OA, RA, fibromyalgia. Complex medical history also includes additional contributing conditions including obesity,  hx recurrent DVT and single PE, hx LE cellulitis, asthma and COPD , Hx L breast Ca w SNLB, (Stage 0/ sub clinical LUE/LUQ lymphedema)  partial mastectomy and XRT (2017), MS, and R THA 2009.    Limitations  chronic B leg swelling and associated pain, difficulty walking, impaired transfers and functional mobility, decreased balance,, decreaased joint AROM 2/2 sweling at hips, knees ankles and toes ( difficulty bending to reach feet to perform LB bathing, skin and nail care,  and LB dressing) impaired activity tolerance  for activities requiring standing and/or walking > 20 minutes, (shopping, home management chores, cooking). Impaired body image. Impaired social participation    Repetition  Increases Symptoms    Special Tests  + Stemmer sign B feet    Pain Onset  Other (comment)   chronic                  OT Treatments/Exercises (OP) - 02/26/19 0001      ADLs   ADL Education  Given  Yes  (Pended)       Manual Therapy   Manual Therapy  Edema management;Manual Lymphatic Drainage (MLD);Compression Bandaging  (Pended)              OT Education - 02/26/19 1327    Education Details  Continued skilled Pt/caregiver education  And LE ADL training throughout visit for lymphedema self care/ home program, including compression wrapping, compression garment and device wear/care, lymphatic pumping ther ex, simple self-MLD, and skin care. Discussed progress towards goals.    Person(s) Educated  Patient    Methods  Explanation;Demonstration;Handout    Comprehension  Verbalized understanding;Returned demonstration;Need further instruction          OT Long Term Goals - 02/25/19 1019      OT LONG TERM GOAL #1    Title  Pt will demonstrate modified independance with lymphedema (LE) precautions and prevention strategies by identifying 6  items from list provided in LE Workbook  by 4th OT Rx visit to limit LE progression.    Baseline  Max A MET 02/23/19    Time  4   4th OT rx visit   Period  Days    Status  Achieved      OT LONG TERM GOAL #2   Title  Pt will be able to apply  knee length, multi-layer, short stretch compression wraps using correct gradient techniques with mod CG assistance  to achieve optimal limb volume reduction, and to return affected limb , as closely as possible, to premorbid size and shape, to limit infection risk and to improve tissue integrity.    Baseline  dependent 02/23/19 GOAL MET    Time  4    Period  Days    Status  Achieved      OT LONG TERM GOAL #3   Title  Pt to achieve at least 10% limb volume reduction below knees bilaterally  during Intensive Phase CDT  to improve tissue elasticity needed for optimal lymphatic function and joint AROM, , to return limbs to premorbid size and shape, to improve functional transfers and mobility, and to decrease infection risk.    Baseline  Max A    Time  12    Period  Weeks    Status  New      OT LONG TERM GOAL #4   Title  Pt will achieve  100% compliance with daily LE self-care program with mod  caregiver assistance throughout Intensive Phase CDT, including daily  skin care, lymphatic pumping therex, simple self-MLD and prescribe compression to ensure optimal limb volume reduction, to reduce infection risk and to limit LE progression for optimal functional performance in all occupational domains.    Baseline  Max A    Time  12    Period  Weeks    Status  New      OT LONG TERM GOAL #5   Title  Pt will demonstrate modified independence using assistive devices and extra time to don and doff properly fitting, custom compression garments/devices for optimal LE self-management over time.    Baseline  Max A    Time  12    Period  Weeks     Status  New      OT LONG TERM GOAL #6   Title  Pt will retain clinical BLE limb volume reductions over 6 months following intensive phase CDT ( management phase) with mod A from CG and by Pt consistently following all home program components daily to limit  LE progression and further functional decline.    Baseline  Dependent    Time  6    Period  Months    Status  New            Plan - 02/26/19 1328    Clinical Impression Statement  Pt was diligent with compression wrapping between visits. Her wrapping skills are improved again today, We reviewed wrapping instructions at end of session with verbal  step by step during demonstration. Pt toleateed MLD and skin care without difficulty. She experiences no pain with MLD. The foot  appears fully decongested and "shelf" at ankle, typical of lipo-lymphedema, is resolved . Added Artiflex cast padding at ankle underbandages to alleviate discomfort 2/2 concentrated compression at narrowest ankle in keeping w/ [place]. Cont as per POC.    OT Occupational Profile and History  Comprehensive Assessment- Review of records and extensive additional review of physical, cognitive, psychosocial history related to current functional performance    Occupational performance deficits (Please refer to evaluation for details):  ADL's;IADL's;Social Participation;Leisure;Work;Play    Body Structure / Function / Physical Skills  ADL;Decreased knowledge of precautions;Flexibility;ROM;Balance;Decreased knowledge of use of DME;Mobility;Edema;Skin integrity;Obesity;Strength;IADL    Psychosocial Skills  Coping Strategies;Habits    Rehab Potential  Good    Clinical Decision Making  Multiple treatment options, significant modification of task necessary    Comorbidities Affecting Occupational Performance:  Presence of comorbidities impacting occupational performance    Comorbidities impacting occupational performance description:  see SUBJECTIVE    Modification or  Assistance to Complete Evaluation   Min-Moderate modification of tasks or assist with assess necessary to complete eval    OT Frequency  3x / week    OT Duration  12 weeks   and PRN to address bilateral involvement-one leg at a time to limit falls risk   OT Treatment/Interventions  Self-care/ADL training;Therapeutic exercise;Manual lymph drainage;Coping strategies training;Other (comment);Patient/family education;Compression bandaging;Therapeutic activities;Manual Therapy;DME and/or AE instruction    Plan  CDT: manua lymphatic drainage (MLD, skin care, lymphatic pumping therex, gradient compression wraps and compression garments / devices)    Consulted and Agree with Plan of Care  Patient       Patient will benefit from skilled therapeutic intervention in order to improve the following deficits and impairments:   Body Structure / Function / Physical Skills: ADL, Decreased knowledge of precautions, Flexibility, ROM, Balance, Decreased knowledge of use of DME, Mobility, Edema, Skin integrity, Obesity, Strength, IADL   Psychosocial Skills: Coping Strategies, Habits   Visit Diagnosis: 1. Lymphedema, not elsewhere classified       Problem List Patient Active Problem List   Diagnosis Date Noted  . Thrombocytopenia ([place]) 01/23/2019  . Leukopenia 01/23/2019  . Lymphedema of both lower extremities 01/16/2019  . Polyarthralgia 10/14/2018  . Headache disorder 10/14/2018  . Chronic daily headache 10/11/2018  . COPD (chronic obstructive pulmonary disease) ([place]) 10/11/2018  . Thoracic aortic atherosclerosis ([place]) 10/11/2018  . Multiple episodes of deep venous thrombosis ([place]) 09/17/2018  . IBS (irritable bowel syndrome) 09/17/2018  . Healthcare maintenance 09/17/2018  . Primary osteoarthritis of right hip 09/03/2018  . Presence of right hip implant 09/03/2018  . Morbid obesity with BMI of 45.0-49.9, adult ([place]) 08/23/2018  . Shortness of breath 08/22/2018  . History of ductal carcinoma in  situ (DCIS) of breast 07/20/2018  . Aromatase inhibitor use 07/20/2018  . Abnormal findings on auscultation 07/16/2018  . Encounter for long-term (current) use of high-risk medication 07/16/2018  . Fibromyalgia 07/16/2018  .  Other forms of systemic lupus erythematosus ([place]) 07/16/2018  . Other osteoporosis without current pathological fracture 07/16/2018  . Seronegative arthritis 07/16/2018    Andrey Spearman, MS, OTR/L, [facility] 02/26/19 1:34 PM  [place] MAIN [facility] SERVICES [address] [place], [place], [address] Phone: [phone]   Fax:  [phone]  Name: Victoria [name] MRN: [id number] Date of Birth: 09-21-1960

## 2019-02-27 ENCOUNTER — Encounter: Payer: Medicare Other | Admitting: Occupational Therapy

## 2019-02-27 ENCOUNTER — Inpatient Hospital Stay: Payer: Medicare Other

## 2019-03-03 ENCOUNTER — Ambulatory Visit: Payer: Medicare Other | Admitting: Occupational Therapy

## 2019-03-03 ENCOUNTER — Other Ambulatory Visit: Payer: Self-pay

## 2019-03-03 DIAGNOSIS — I890 Lymphedema, not elsewhere classified: Secondary | ICD-10-CM | POA: Diagnosis not present

## 2019-03-03 NOTE — Therapy (Signed)
[place] MAIN [facility] SERVICES [address] [place], [place], [address] Phone: [phone]   Fax:  [phone]  Occupational Therapy Treatment  Patient Details  Name: Victoria [name] MRN: [id number] Date of Birth: July 26, 1961 Referring Provider (OT): Marlowe Sax, MD   Encounter Date: 03/03/2019  OT End of Session - 03/03/19 1755    Visit Number  6    Number of Visits  36    Date for OT Re-Evaluation  05/11/19    OT Start Time  0220    OT Stop Time  0320    OT Time Calculation (min)  60 min    Activity Tolerance  Patient tolerated treatment well;No increased pain    Behavior During Therapy  WFL for tasks assessed/performed       Past Medical History:  Diagnosis Date  . Asthma   . Cancer (HCC)    Breast Left, DCIS   . Collagen vascular disease ([place])   . COPD (chronic obstructive pulmonary disease) ([place])   . Diverticulitis   . Fibromyalgia   . GERD (gastroesophageal reflux disease)   . Hearing impaired person, left   . IBS (irritable bowel syndrome)   . Lupus ([place])   . Mitral valve disorder   . MS (multiple sclerosis) ([place])   . Neuromuscular disorder ([place])   . Osteoarthritis   . Personal history of chemotherapy   . Personal history of radiation therapy 2017   left   . Rheumatoid arthritis ([place])   . Seizures ([place])     Past Surgical History:  Procedure Laterality Date  . ABDOMINAL HYSTERECTOMY    . APPENDECTOMY    . BREAST EXCISIONAL BIOPSY Left 2017   lumpectomy with radation  . BREAST EXCISIONAL BIOPSY Left 2004   neg surgical bx   . BREAST SURGERY    . CHOLECYSTECTOMY    . PATENT DUCTUS ARTERIOUS REPAIR    . RECONSTRUCTION TENDON PULLEY HAND Left   . TOTAL HIP ARTHROPLASTY Right     There were no vitals filed for this visit.  Subjective Assessment - 03/03/19 1749    Subjective   Victoria [name] presents to OT visit 7/ 36 to address BLE lipo-lymphedema. Pt presents with compression wraps in place. " I didn't  do a very good job with my wraps last night."    Pertinent History  Complex medical history includes several systemic, inflammatory conditions, including lupus, OA, RA, fibromyalgia. Complex medical history also includes additional contributing conditions including obesity,  hx recurrent DVT and single PE, hx LE cellulitis, asthma and COPD , Hx L breast Ca w SNLB, (Stage 0/ sub clinical LUE/LUQ lymphedema)  partial mastectomy and XRT (2017), MS, and R THA 2009.    Limitations  chronic B leg swelling and associated pain, difficulty walking, impaired transfers and functional mobility, decreased balance,, decreaased joint AROM 2/2 sweling at hips, knees ankles and toes ( difficulty bending to reach feet to perform LB bathing, skin and nail care,  and LB dressing) impaired activity tolerance  for activities requiring standing and/or walking > 20 minutes, (shopping, home management chores, cooking). Impaired body image. Impaired social participation    Repetition  Increases Symptoms    Special Tests  + Stemmer sign B feet    Pain Onset  Other (comment)   chronic         LYMPHEDEMA/ONCOLOGY QUESTIONNAIRE - 03/03/19 1752      Left Lower Extremity Lymphedema   Other  LLE A-D volume = 4036.00  Other  lle VOLUME DECREASED 13.93% SINCE 02/13/19              OT Treatments/Exercises (OP) - 03/03/19 0001      ADLs   ADL Education Given  Yes      Manual Therapy   Manual Therapy  Edema management;Manual Lymphatic Drainage (MLD);Compression Bandaging    Edema Management  LLE comparative limb volumetrics A-D segment    Manual Lymphatic Drainage (MLD)  LLE MLD as established    Compression Bandaging  12 and 15 cm wide x 5 meter short stretch wrapps applied over single layer knee length cotton stockinett then 0.04 mm thick RosidaL FOAM, USING CIRCUMFERENTIAL GRADIENT TECHNIQUES.             OT Education - 03/03/19 1751    Education Details  Pt edu re custom flat knit  vs  OTS circular  knit garments. Discussed recommendations for custom in her case. Pt edu re outcome   of volumetrics measurements and profgress towaRDS GOALS.    Person(s) Educated  Patient    Methods  Explanation;Demonstration;Handout    Comprehension  Verbalized understanding;Returned demonstration;Need further instruction          OT Long Term Goals - 02/25/19 1019      OT LONG TERM GOAL #1   Title  Pt will demonstrate modified independance with lymphedema (LE) precautions and prevention strategies by identifying 6  items from list provided in LE Workbook  by 4th OT Rx visit to limit LE progression.    Baseline  Max A MET 02/23/19    Time  4   4th OT rx visit   Period  Days    Status  Achieved      OT LONG TERM GOAL #2   Title  Pt will be able to apply  knee length, multi-layer, short stretch compression wraps using correct gradient techniques with mod CG assistance  to achieve optimal limb volume reduction, and to return affected limb , as closely as possible, to premorbid size and shape, to limit infection risk and to improve tissue integrity.    Baseline  dependent 02/23/19 GOAL MET    Time  4    Period  Days    Status  Achieved      OT LONG TERM GOAL #3   Title  Pt to achieve at least 10% limb volume reduction below knees bilaterally  during Intensive Phase CDT  to improve tissue elasticity needed for optimal lymphatic function and joint AROM, , to return limbs to premorbid size and shape, to improve functional transfers and mobility, and to decrease infection risk.    Baseline  Max A    Time  12    Period  Weeks    Status  New      OT LONG TERM GOAL #4   Title  Pt will achieve  100% compliance with daily LE self-care program with mod  caregiver assistance throughout Intensive Phase CDT, including daily  skin care, lymphatic pumping therex, simple self-MLD and prescribe compression to ensure optimal limb volume reduction, to reduce infection risk and to limit LE progression for optimal  functional performance in all occupational domains.    Baseline  Max A    Time  12    Period  Weeks    Status  New      OT LONG TERM GOAL #5   Title  Pt will demonstrate modified independence using assistive devices and extra time to don and doff  properly fitting, custom compression garments/devices for optimal LE self-management over time.    Baseline  Max A    Time  12    Period  Weeks    Status  New      OT LONG TERM GOAL #6   Title  Pt will retain clinical BLE limb volume reductions over 6 months following intensive phase CDT ( management phase) with mod A from CG and by Pt consistently following all home program components daily to limit LE progression and further functional decline.    Baseline  Dependent    Time  6    Period  Months    Status  New            Plan - 03/03/19 1756    Clinical Impression Statement  rEVIEWED WRAPPING TECHNIQUES BRIEFLY WHEN REMOVING AND REPLACING WRAPS TODAY. pT GENERALLY HAS TECHNIQUUE CORRECT, SHE JUST ISN'T DISTRIBUTING LAYERS HIGH ENOUGH AND IS CONCENTRATING TOO MUCH COMPRESSION ON FOOT.  Pt is excited to observe pantaloon -l;ike swelling, or " shelf" at  both ankles has resolved. Comparative limb volumetrics below the R knee revels R leg is decreased in volume by 13.93% from ankle to tibial tuberosity since initially measured on 02/13/19. This value meets and excceeds 10% limb volume reduction goal. Pt tolerated manual therapy and compression wraps after Rx without difficulty today. Cont as per POC.    OT Occupational Profile and History  Comprehensive Assessment- Review of records and extensive additional review of physical, cognitive, psychosocial history related to current functional performance    Occupational performance deficits (Please refer to evaluation for details):  ADL's;IADL's;Social Participation;Leisure;Work;Play    Body Structure / Function / Physical Skills  ADL;Decreased knowledge of precautions;Flexibility;ROM;Balance;Decreased  knowledge of use of DME;Mobility;Edema;Skin integrity;Obesity;Strength;IADL    Psychosocial Skills  Coping Strategies;Habits    Rehab Potential  Good    Clinical Decision Making  Multiple treatment options, significant modification of task necessary    Comorbidities Affecting Occupational Performance:  Presence of comorbidities impacting occupational performance    Comorbidities impacting occupational performance description:  see SUBJECTIVE    Modification or Assistance to Complete Evaluation   Min-Moderate modification of tasks or assist with assess necessary to complete eval    OT Frequency  3x / week    OT Duration  12 weeks   and PRN to address bilateral involvement-one leg at a time to limit falls risk   OT Treatment/Interventions  Self-care/ADL training;Therapeutic exercise;Manual lymph drainage;Coping strategies training;Other (comment);Patient/family education;Compression bandaging;Therapeutic activities;Manual Therapy;DME and/or AE instruction    Plan  CDT: manua lymphatic drainage (MLD, skin care, lymphatic pumping therex, gradient compression wraps and compression garments / devices)    Consulted and Agree with Plan of Care  Patient       Patient will benefit from skilled therapeutic intervention in order to improve the following deficits and impairments:   Body Structure / Function / Physical Skills: ADL, Decreased knowledge of precautions, Flexibility, ROM, Balance, Decreased knowledge of use of DME, Mobility, Edema, Skin integrity, Obesity, Strength, IADL   Psychosocial Skills: Coping Strategies, Habits   Visit Diagnosis: 1. Lymphedema, not elsewhere classified       Problem List Patient Active Problem List   Diagnosis Date Noted  . Thrombocytopenia ([place]) 01/23/2019  . Leukopenia 01/23/2019  . Lymphedema of both lower extremities 01/16/2019  . Polyarthralgia 10/14/2018  . Headache disorder 10/14/2018  . Chronic daily headache 10/11/2018  . COPD (chronic obstructive  pulmonary disease) ([place]) 10/11/2018  . Thoracic aortic atherosclerosis (  [place]) 10/11/2018  . Multiple episodes of deep venous thrombosis ([place]) 09/17/2018  . IBS (irritable bowel syndrome) 09/17/2018  . Healthcare maintenance 09/17/2018  . Primary osteoarthritis of right hip 09/03/2018  . Presence of right hip implant 09/03/2018  . Morbid obesity with BMI of 45.0-49.9, adult ([place]) 08/23/2018  . Shortness of breath 08/22/2018  . History of ductal carcinoma in situ (DCIS) of breast 07/20/2018  . Aromatase inhibitor use 07/20/2018  . Abnormal findings on auscultation 07/16/2018  . Encounter for long-term (current) use of high-risk medication 07/16/2018  . Fibromyalgia 07/16/2018  . Other forms of systemic lupus erythematosus ([place]) 07/16/2018  . Other osteoporosis without current pathological fracture 07/16/2018  . Seronegative arthritis 07/16/2018    Andrey Spearman, MS, OTR/L, [facility] 03/03/19 6:01 PM  [place] MAIN [facility] SERVICES [address] [place], [place], [address] Phone: [phone]   Fax:  [phone]  Name: Victoria [name] MRN: [id number] Date of Birth: Oct 06, 1960

## 2019-03-04 ENCOUNTER — Ambulatory Visit: Payer: Medicare Other | Admitting: Occupational Therapy

## 2019-03-05 ENCOUNTER — Inpatient Hospital Stay: Payer: Medicare Other

## 2019-03-06 ENCOUNTER — Encounter: Payer: Medicare Other | Admitting: Occupational Therapy

## 2019-03-10 ENCOUNTER — Other Ambulatory Visit: Payer: Self-pay

## 2019-03-10 ENCOUNTER — Ambulatory Visit: Payer: Medicare Other | Attending: Internal Medicine | Admitting: Occupational Therapy

## 2019-03-10 DIAGNOSIS — I890 Lymphedema, not elsewhere classified: Secondary | ICD-10-CM | POA: Diagnosis present

## 2019-03-10 NOTE — Therapy (Signed)
[place] MAIN [facility] SERVICES [address] [place], [place], [address] Phone: [phone]   Fax:  [phone]  Occupational Therapy Treatment  Patient Details  Name: Victoria [name] MRN: [id number] Date of Birth: [birth year] Referring Provider (OT): Marlowe Sax, MD   Encounter Date: 03/10/2019  OT End of Session - 03/10/19 3267    Visit Number  7    Number of Visits  36    Date for OT Re-Evaluation  05/11/19    OT Start Time  0100    OT Stop Time  0205    OT Time Calculation (min)  65 min    Activity Tolerance  Patient tolerated treatment well;No increased pain    Behavior During Therapy  WFL for tasks assessed/performed       Past Medical History:  Diagnosis Date  . Asthma   . Cancer (HCC)    Breast Left, DCIS   . Collagen vascular disease ([place])   . COPD (chronic obstructive pulmonary disease) ([place])   . Diverticulitis   . Fibromyalgia   . GERD (gastroesophageal reflux disease)   . Hearing impaired person, left   . IBS (irritable bowel syndrome)   . Lupus ([place])   . Mitral valve disorder   . MS (multiple sclerosis) ([place])   . Neuromuscular disorder ([place])   . Osteoarthritis   . Personal history of chemotherapy   . Personal history of radiation therapy 2017   left   . Rheumatoid arthritis ([place])   . Seizures ([place])     Past Surgical History:  Procedure Laterality Date  . ABDOMINAL HYSTERECTOMY    . APPENDECTOMY    . BREAST EXCISIONAL BIOPSY Left 2017   lumpectomy with radation  . BREAST EXCISIONAL BIOPSY Left 2004   neg surgical bx   . BREAST SURGERY    . CHOLECYSTECTOMY    . PATENT DUCTUS ARTERIOUS REPAIR    . RECONSTRUCTION TENDON PULLEY HAND Left   . TOTAL HIP ARTHROPLASTY Right     There were no vitals filed for this visit.  Subjective Assessment - 03/10/19 1306    Subjective   Victoria [name] presents to OT visit 8/ 36 to address BLE lipo-lymphedema. Pt presents with compression wraps in place. Pt c/o new  pain in "ball" of R foot. We discussed possible contributing factors including limited support from old tennis shoes, compensatory gait changes due to wrap on opposite leg, and walking more with friend over the weekend. We'll monitor this carefully.    Pertinent History  Complex medical history includes several systemic, inflammatory conditions, including lupus, OA, RA, fibromyalgia. Complex medical history also includes additional contributing conditions including obesity,  hx recurrent DVT and single PE, hx LE cellulitis, asthma and COPD , Hx L breast Ca w SNLB, (Stage 0/ sub clinical LUE/LUQ lymphedema)  partial mastectomy and XRT (2017), MS, and R THA 2009.    Limitations  chronic B leg swelling and associated pain, difficulty walking, impaired transfers and functional mobility, decreased balance,, decreaased joint AROM 2/2 sweling at hips, knees ankles and toes ( difficulty bending to reach feet to perform LB bathing, skin and nail care,  and LB dressing) impaired activity tolerance  for activities requiring standing and/or walking > 20 minutes, (shopping, home management chores, cooking). Impaired body image. Impaired social participation    Repetition  Increases Symptoms    Special Tests  + Stemmer sign B feet    Pain Onset  Other (comment)   chronic  OT Treatments/Exercises (OP) - 03/10/19 0001      ADLs   ADL Education Given  Yes      Manual Therapy   Manual Therapy  Edema management;Manual Lymphatic Drainage (MLD);Compression Bandaging;Taping    Manual therapy comments  Kinesiotape to plantar  surface of R foot to reduce pain   2/2 suspected plantar fasciitis    Manual Lymphatic Drainage (MLD)  LLE MLD as established    Compression Bandaging  12 and 15 cm wide x 5 meter short stretch wrapps applied over single layer knee length cotton stockinett then 0.04 mm thick RosidaL FOAM, USING CIRCUMFERENTIAL GRADIENT TECHNIQUES.             OT Education -  03/10/19 1638    Education Details  Pt edu for indications and benefits for kinesiotape for plantar fasciitis and lymphedema management    Person(s) Educated  Patient    Methods  Explanation;Demonstration;Handout    Comprehension  Verbalized understanding;Returned demonstration;Need further instruction          OT Long Term Goals - 02/25/19 1019      OT LONG TERM GOAL #1   Title  Pt will demonstrate modified independance with lymphedema (LE) precautions and prevention strategies by identifying 6  items from list provided in LE Workbook  by 4th OT Rx visit to limit LE progression.    Baseline  Max A MET 02/23/19    Time  4   4th OT rx visit   Period  Days    Status  Achieved      OT LONG TERM GOAL #2   Title  Pt will be able to apply  knee length, multi-layer, short stretch compression wraps using correct gradient techniques with mod CG assistance  to achieve optimal limb volume reduction, and to return affected limb , as closely as possible, to premorbid size and shape, to limit infection risk and to improve tissue integrity.    Baseline  dependent 02/23/19 GOAL MET    Time  4    Period  Days    Status  Achieved      OT LONG TERM GOAL #3   Title  Pt to achieve at least 10% limb volume reduction below knees bilaterally  during Intensive Phase CDT  to improve tissue elasticity needed for optimal lymphatic function and joint AROM, , to return limbs to premorbid size and shape, to improve functional transfers and mobility, and to decrease infection risk.    Baseline  Max A    Time  12    Period  Weeks    Status  New      OT LONG TERM GOAL #4   Title  Pt will achieve  100% compliance with daily LE self-care program with mod  caregiver assistance throughout Intensive Phase CDT, including daily  skin care, lymphatic pumping therex, simple self-MLD and prescribe compression to ensure optimal limb volume reduction, to reduce infection risk and to limit LE progression for optimal functional  performance in all occupational domains.    Baseline  Max A    Time  12    Period  Weeks    Status  New      OT LONG TERM GOAL #5   Title  Pt will demonstrate modified independence using assistive devices and extra time to don and doff properly fitting, custom compression garments/devices for optimal LE self-management over time.    Baseline  Max A    Time  12    Period  Weeks    Status  New      OT LONG TERM GOAL #6   Title  Pt will retain clinical BLE limb volume reductions over 6 months following intensive phase CDT ( management phase) with mod A from CG and by Pt consistently following all home program components daily to limit LE progression and further functional decline.    Baseline  Dependent    Time  6    Period  Months    Status  New            Plan - 03/10/19 1640    Clinical Impression Statement  Suspect R plantar foot pain may be flare up of PF due to compensatory gait changes  while LLE is wrapped. Encouraged Pt to be mindful of and correct limping when che catches herself  doing it. Provided MLD , skin care and compression wraps as established to LLE. Cont as per POC.    OT Occupational Profile and History  Comprehensive Assessment- Review of records and extensive additional review of physical, cognitive, psychosocial history related to current functional performance    Occupational performance deficits (Please refer to evaluation for details):  ADL's;IADL's;Social Participation;Leisure;Work;Play    Body Structure / Function / Physical Skills  ADL;Decreased knowledge of precautions;Flexibility;ROM;Balance;Decreased knowledge of use of DME;Mobility;Edema;Skin integrity;Obesity;Strength;IADL    Psychosocial Skills  Coping Strategies;Habits    Rehab Potential  Good    Clinical Decision Making  Multiple treatment options, significant modification of task necessary    Comorbidities Affecting Occupational Performance:  Presence of comorbidities impacting occupational  performance    Comorbidities impacting occupational performance description:  see SUBJECTIVE    Modification or Assistance to Complete Evaluation   Min-Moderate modification of tasks or assist with assess necessary to complete eval    OT Frequency  3x / week    OT Duration  12 weeks   and PRN to address bilateral involvement-one leg at a time to limit falls risk   OT Treatment/Interventions  Self-care/ADL training;Therapeutic exercise;Manual lymph drainage;Coping strategies training;Other (comment);Patient/family education;Compression bandaging;Therapeutic activities;Manual Therapy;DME and/or AE instruction    Plan  CDT: manua lymphatic drainage (MLD, skin care, lymphatic pumping therex, gradient compression wraps and compression garments / devices)    Consulted and Agree with Plan of Care  Patient       Patient will benefit from skilled therapeutic intervention in order to improve the following deficits and impairments:   Body Structure / Function / Physical Skills: ADL, Decreased knowledge of precautions, Flexibility, ROM, Balance, Decreased knowledge of use of DME, Mobility, Edema, Skin integrity, Obesity, Strength, IADL   Psychosocial Skills: Coping Strategies, Habits   Visit Diagnosis: 1. Lymphedema, not elsewhere classified       Problem List Patient Active Problem List   Diagnosis Date Noted  . Thrombocytopenia ([place]) 01/23/2019  . Leukopenia 01/23/2019  . Lymphedema of both lower extremities 01/16/2019  . Polyarthralgia 10/14/2018  . Headache disorder 10/14/2018  . Chronic daily headache 10/11/2018  . COPD (chronic obstructive pulmonary disease) ([place]) 10/11/2018  . Thoracic aortic atherosclerosis ([place]) 10/11/2018  . Multiple episodes of deep venous thrombosis ([place]) 09/17/2018  . IBS (irritable bowel syndrome) 09/17/2018  . Healthcare maintenance 09/17/2018  . Primary osteoarthritis of right hip 09/03/2018  . Presence of right hip implant 09/03/2018  . Morbid obesity  with BMI of 45.0-49.9, adult ([place]) 08/23/2018  . Shortness of breath 08/22/2018  . History of ductal carcinoma in situ (DCIS) of breast 07/20/2018  . Aromatase inhibitor use 07/20/2018  .  Abnormal findings on auscultation 07/16/2018  . Encounter for long-term (current) use of high-risk medication 07/16/2018  . Fibromyalgia 07/16/2018  . Other forms of systemic lupus erythematosus ([place]) 07/16/2018  . Other osteoporosis without current pathological fracture 07/16/2018  . Seronegative arthritis 07/16/2018    Andrey Spearman, MS, OTR/L, [facility] 03/10/19 4:44 PM  [place] MAIN [facility] SERVICES [address] [place], [place], [address] Phone: [phone]   Fax:  [phone]  Name: Victoria [name] MRN: [id number] Date of Birth: [birth year]

## 2019-03-11 ENCOUNTER — Other Ambulatory Visit: Payer: Self-pay

## 2019-03-11 ENCOUNTER — Ambulatory Visit: Payer: Medicare Other | Admitting: Occupational Therapy

## 2019-03-11 DIAGNOSIS — I890 Lymphedema, not elsewhere classified: Secondary | ICD-10-CM

## 2019-03-11 NOTE — Therapy (Signed)
[place] MAIN [facility] SERVICES [address] [place], [place], [address] Phone: [phone]   Fax:  [phone]  Occupational Therapy Treatment  Patient Details  Name: Victoria [name] MRN: [id number] Date of Birth: 09/01/1960 Referring Provider (OT): Marlowe Sax, MD   Encounter Date: 03/11/2019  OT End of Session - 03/11/19 1002    Visit Number  8    Number of Visits  36    Date for OT Re-Evaluation  05/11/19    OT Start Time  0909    OT Stop Time  1003    OT Time Calculation (min)  54 min    Activity Tolerance  Patient tolerated treatment well;No increased pain    Behavior During Therapy  WFL for tasks assessed/performed       Past Medical History:  Diagnosis Date  . Asthma   . Cancer (HCC)    Breast Left, DCIS   . Collagen vascular disease ([place])   . COPD (chronic obstructive pulmonary disease) ([place])   . Diverticulitis   . Fibromyalgia   . GERD (gastroesophageal reflux disease)   . Hearing impaired person, left   . IBS (irritable bowel syndrome)   . Lupus ([place])   . Mitral valve disorder   . MS (multiple sclerosis) ([place])   . Neuromuscular disorder ([place])   . Osteoarthritis   . Personal history of chemotherapy   . Personal history of radiation therapy 2017   left   . Rheumatoid arthritis ([place])   . Seizures ([place])     Past Surgical History:  Procedure Laterality Date  . ABDOMINAL HYSTERECTOMY    . APPENDECTOMY    . BREAST EXCISIONAL BIOPSY Left 2017   lumpectomy with radation  . BREAST EXCISIONAL BIOPSY Left 2004   neg surgical bx   . BREAST SURGERY    . CHOLECYSTECTOMY    . PATENT DUCTUS ARTERIOUS REPAIR    . RECONSTRUCTION TENDON PULLEY HAND Left   . TOTAL HIP ARTHROPLASTY Right     There were no vitals filed for this visit.  Subjective Assessment - 03/11/19 1657    Subjective   Victoria [name] presents to OT visit 9/ 36 to address BLE lipo-lymphedema. Pt presents without compression wraps in place. Pt  reports kinesiotape to R foot  provides relief for plantar facsciitis. Pt reports arthritis pain 9/10.    Pertinent History  Complex medical history includes several systemic, inflammatory conditions, including lupus, OA, RA, fibromyalgia. Complex medical history also includes additional contributing conditions including obesity,  hx recurrent DVT and single PE, hx LE cellulitis, asthma and COPD , Hx L breast Ca w SNLB, (Stage 0/ sub clinical LUE/LUQ lymphedema)  partial mastectomy and XRT (2017), MS, and R THA 2009.    Limitations  chronic B leg swelling and associated pain, difficulty walking, impaired transfers and functional mobility, decreased balance,, decreaased joint AROM 2/2 sweling at hips, knees ankles and toes ( difficulty bending to reach feet to perform LB bathing, skin and nail care,  and LB dressing) impaired activity tolerance  for activities requiring standing and/or walking > 20 minutes, (shopping, home management chores, cooking). Impaired body image. Impaired social participation    Repetition  Increases Symptoms    Special Tests  + Stemmer sign B feet    Currently in Pain?  Yes    Pain Onset  Other (comment)   chronic                  OT Treatments/Exercises (OP) -  03/11/19 0001      ADLs   ADL Education Given  Yes      Manual Therapy   Manual Therapy  Edema management;Manual Lymphatic Drainage (MLD);Compression Bandaging;Taping    Manual therapy comments  KTape to plantar surface of R foot remains in place. Taght Pt how to apply and provided tape for weekend.    Manual Lymphatic Drainage (MLD)  LLE MLD as established    Compression Bandaging  12 and 15 cm wide x 5 meter short stretch wrapps applied over single layer knee length cotton stockinett then 0.04 mm thick RosidaL FOAM, USING CIRCUMFERENTIAL GRADIENT TECHNIQUES.             OT Education - 03/11/19 1548    Education Details  Pt edu for indications and benefits for kinesiotape for plantar  fasciitis and lymphedema management    Person(s) Educated  Patient    Methods  Explanation;Demonstration;Handout    Comprehension  Verbalized understanding;Returned demonstration;Need further instruction          OT Long Term Goals - 02/25/19 1019      OT LONG TERM GOAL #1   Title  Pt will demonstrate modified independance with lymphedema (LE) precautions and prevention strategies by identifying 6  items from list provided in LE Workbook  by 4th OT Rx visit to limit LE progression.    Baseline  Max A MET 02/23/19    Time  4   4th OT rx visit   Period  Days    Status  Achieved      OT LONG TERM GOAL #2   Title  Pt will be able to apply  knee length, multi-layer, short stretch compression wraps using correct gradient techniques with mod CG assistance  to achieve optimal limb volume reduction, and to return affected limb , as closely as possible, to premorbid size and shape, to limit infection risk and to improve tissue integrity.    Baseline  dependent 02/23/19 GOAL MET    Time  4    Period  Days    Status  Achieved      OT LONG TERM GOAL #3   Title  Pt to achieve at least 10% limb volume reduction below knees bilaterally  during Intensive Phase CDT  to improve tissue elasticity needed for optimal lymphatic function and joint AROM, , to return limbs to premorbid size and shape, to improve functional transfers and mobility, and to decrease infection risk.    Baseline  Max A    Time  12    Period  Weeks    Status  New      OT LONG TERM GOAL #4   Title  Pt will achieve  100% compliance with daily LE self-care program with mod  caregiver assistance throughout Intensive Phase CDT, including daily  skin care, lymphatic pumping therex, simple self-MLD and prescribe compression to ensure optimal limb volume reduction, to reduce infection risk and to limit LE progression for optimal functional performance in all occupational domains.    Baseline  Max A    Time  12    Period  Weeks    Status   New      OT LONG TERM GOAL #5   Title  Pt will demonstrate modified independence using assistive devices and extra time to don and doff properly fitting, custom compression garments/devices for optimal LE self-management over time.    Baseline  Max A    Time  12    Period  Weeks    Status  New      OT LONG TERM GOAL #6   Title  Pt will retain clinical BLE limb volume reductions over 6 months following intensive phase CDT ( management phase) with mod A from CG and by Pt consistently following all home program components daily to limit LE progression and further functional decline.    Baseline  Dependent    Time  6    Period  Months    Status  New            Plan - 03/11/19 1548    Clinical Impression Statement  R plantar foot pain reduced with kinesiotape. Pt verbalized understanding of kinesiotape application and sample provided for the weekend. Pt continues to respond positively toOT for CDT as evidenced by reduced limb volume, decreased leg pain below the knees, improved functional mobility and transfers. We'll complete  volumetric measurements again next week and when clinical plateau in voume reduction is reached we will complete anatomical measurements for LLE custom compression garments. Once LLE garments are fitted we will commence CDT to RLE.    OT Occupational Profile and History  Comprehensive Assessment- Review of records and extensive additional review of physical, cognitive, psychosocial history related to current functional performance    Occupational performance deficits (Please refer to evaluation for details):  ADL's;IADL's;Social Participation;Leisure;Work;Play    Body Structure / Function / Physical Skills  ADL;Decreased knowledge of precautions;Flexibility;ROM;Balance;Decreased knowledge of use of DME;Mobility;Edema;Skin integrity;Obesity;Strength;IADL    Psychosocial Skills  Coping Strategies;Habits    Rehab Potential  Good    Clinical Decision Making  Multiple  treatment options, significant modification of task necessary    Comorbidities Affecting Occupational Performance:  Presence of comorbidities impacting occupational performance    Comorbidities impacting occupational performance description:  see SUBJECTIVE    Modification or Assistance to Complete Evaluation   Min-Moderate modification of tasks or assist with assess necessary to complete eval    OT Frequency  3x / week    OT Duration  12 weeks   and PRN to address bilateral involvement-one leg at a time to limit falls risk   OT Treatment/Interventions  Self-care/ADL training;Therapeutic exercise;Manual lymph drainage;Coping strategies training;Other (comment);Patient/family education;Compression bandaging;Therapeutic activities;Manual Therapy;DME and/or AE instruction    Plan  CDT: manua lymphatic drainage (MLD, skin care, lymphatic pumping therex, gradient compression wraps and compression garments / devices)    Consulted and Agree with Plan of Care  Patient       Patient will benefit from skilled therapeutic intervention in order to improve the following deficits and impairments:   Body Structure / Function / Physical Skills: ADL, Decreased knowledge of precautions, Flexibility, ROM, Balance, Decreased knowledge of use of DME, Mobility, Edema, Skin integrity, Obesity, Strength, IADL   Psychosocial Skills: Coping Strategies, Habits   Visit Diagnosis: 1. Lymphedema, not elsewhere classified       Problem List Patient Active Problem List   Diagnosis Date Noted  . Thrombocytopenia ([place]) 01/23/2019  . Leukopenia 01/23/2019  . Lymphedema of both lower extremities 01/16/2019  . Polyarthralgia 10/14/2018  . Headache disorder 10/14/2018  . Chronic daily headache 10/11/2018  . COPD (chronic obstructive pulmonary disease) ([place]) 10/11/2018  . Thoracic aortic atherosclerosis ([place]) 10/11/2018  . Multiple episodes of deep venous thrombosis ([place]) 09/17/2018  . IBS (irritable bowel syndrome)  09/17/2018  . Healthcare maintenance 09/17/2018  . Primary osteoarthritis of right hip 09/03/2018  . Presence of right hip implant 09/03/2018  . Morbid obesity with BMI of  45.0-49.9, adult ([place]) 08/23/2018  . Shortness of breath 08/22/2018  . History of ductal carcinoma in situ (DCIS) of breast 07/20/2018  . Aromatase inhibitor use 07/20/2018  . Abnormal findings on auscultation 07/16/2018  . Encounter for long-term (current) use of high-risk medication 07/16/2018  . Fibromyalgia 07/16/2018  . Other forms of systemic lupus erythematosus ([place]) 07/16/2018  . Other osteoporosis without current pathological fracture 07/16/2018  . Seronegative arthritis 07/16/2018    Andrey Spearman, MS, OTR/L, [facility] 03/11/19 3:54 PM  [place] MAIN [facility] SERVICES [address] [place], [place], [address] Phone: [phone]   Fax:  [phone]  Name: Victoria [name] MRN: [id number] Date of Birth: 01/28/1961

## 2019-03-14 ENCOUNTER — Ambulatory Visit: Payer: Medicare Other | Admitting: Occupational Therapy

## 2019-03-17 ENCOUNTER — Encounter: Payer: Self-pay | Admitting: Student in an Organized Health Care Education/Training Program

## 2019-03-18 ENCOUNTER — Other Ambulatory Visit: Payer: Self-pay

## 2019-03-18 ENCOUNTER — Encounter: Payer: Self-pay | Admitting: Student in an Organized Health Care Education/Training Program

## 2019-03-18 ENCOUNTER — Ambulatory Visit
Payer: Medicare Other | Attending: Student in an Organized Health Care Education/Training Program | Admitting: Student in an Organized Health Care Education/Training Program

## 2019-03-18 DIAGNOSIS — M069 Rheumatoid arthritis, unspecified: Secondary | ICD-10-CM

## 2019-03-18 DIAGNOSIS — M1380 Other specified arthritis, unspecified site: Secondary | ICD-10-CM

## 2019-03-18 DIAGNOSIS — M5416 Radiculopathy, lumbar region: Secondary | ICD-10-CM

## 2019-03-18 DIAGNOSIS — M797 Fibromyalgia: Secondary | ICD-10-CM

## 2019-03-18 DIAGNOSIS — G894 Chronic pain syndrome: Secondary | ICD-10-CM

## 2019-03-18 DIAGNOSIS — M1611 Unilateral primary osteoarthritis, right hip: Secondary | ICD-10-CM

## 2019-03-18 DIAGNOSIS — M2550 Pain in unspecified joint: Secondary | ICD-10-CM | POA: Diagnosis not present

## 2019-03-18 MED ORDER — HYDROCODONE-ACETAMINOPHEN 7.5-325 MG PO TABS: 1.0000 | ORAL_TABLET | Freq: Two times a day (BID) | ORAL | 0 refills | Status: DC | PRN

## 2019-03-18 NOTE — Progress Notes (Signed)
Pain Management Virtual Encounter Note - Virtual Visit via [place] (real-time audio visits between healthcare provider and patient).   Patient's Phone No. & Preferred Pharmacy:  [phone] (home); [phone] (mobile); (Preferred) [phone] [email or web address]  [place], [place] [address] Phone: [phone] Fax: [phone]    Pre-screening note:  Our staff contacted Victoria [name] and offered her an "in person", "face-to-face" appointment versus a telephone encounter. She indicated preferring the telephone encounter, at this time.   Reason for Virtual Visit: COVID-19*  Social distancing based on CDC and AMA recommendations.   I contacted Victoria [name] on 03/18/2019 via video conference.      I clearly identified myself as Gillis Santa, MD. I verified that I was speaking with the correct person using two identifiers (Name: Victoria [name], and date of birth: [birth year]).  Advanced Informed Consent I sought verbal advanced consent from Victoria [name] for virtual visit interactions. I informed Victoria [name] of possible security and privacy concerns, risks, and limitations associated with providing "not-in-person" medical evaluation and management services. I also informed Victoria [name] of the availability of "in-person" appointments. Finally, I informed her that there would be a charge for the virtual visit and that she could be  personally, fully or partially, financially responsible for it. Victoria [name] expressed understanding and agreed to proceed.   Historic Elements   Victoria [name] is a 58 y.o. year old, female patient evaluated today after her last encounter by our practice on 02/05/2019. Victoria [name]  has a past medical history of Asthma, Cancer ([place]), Collagen vascular disease ([place]), COPD (chronic obstructive pulmonary disease) ([place]), Diverticulitis, Fibromyalgia, GERD (gastroesophageal  reflux disease), Hearing impaired person, left, IBS (irritable bowel syndrome), Lupus ([place]), Mitral valve disorder, MS (multiple sclerosis) ([place]), Neuromuscular disorder ([place]), Osteoarthritis, Personal history of chemotherapy, Personal history of radiation therapy (2017), Rheumatoid arthritis ([place]), and Seizures ([place]). She also  has a past surgical history that includes Breast surgery; Abdominal hysterectomy; Appendectomy; Cholecystectomy; Patent ductus arterious repair; Total hip arthroplasty (Right); Reconstruction tendon pulley hand (Left); Breast excisional biopsy (Left, 2017); and Breast excisional biopsy (Left, 2004). Victoria [name] has a current medication list which includes the following prescription(s): albuterol, atorvastatin, benzonatate, budesonide-formoterol, cyclobenzaprine, dabigatran, gabapentin, hydrocodone-acetaminophen, hydrocodone-acetaminophen, hydroxychloroquine, ibuprofen, leflunomide, letrozole, omeprazole, potassium chloride, tiotropium bromide monohydrate, topiramate, trazodone, and torsemide, and the following Facility-Administered Medications: sodium chloride flush. She  reports that she quit smoking about 19 years ago. Her smoking use included cigarettes. She has a 30.00 pack-year smoking history. She has never used smokeless tobacco. She reports current alcohol use. She reports that she does not use drugs. Victoria [name] is allergic to sodium hypochlorite; codeine; flagyl [metronidazole]; and sulfa antibiotics.   HPI  Today, she is being contacted for medication management.   No change in medical history since last visit.  Patient's pain is at baseline.  Patient continues multimodal pain regimen as prescribed.  States that it provides pain relief and improvement in functional status.   Pharmacotherapy Assessment  Analgesic: 02/24/2019  1   01/16/2019  Hydrocodone-Acetamin 7.5-325  45.00 30 Bi Lat   [id number]   Gib (4800)   0  11.25 MME  Medicare   [place]     Monitoring: Pharmacotherapy: No side-effects or adverse reactions reported. [place] PMP: PDMP reviewed during this encounter.       Compliance: No problems identified. Effectiveness: Clinically acceptable. Plan: Refer to "POC".  UDS:  Summary  Date Value Ref Range Status  10/21/2018  FINAL  Final    Comment:    ==================================================================== TOXASSURE COMP DRUG ANALYSIS,UR ==================================================================== Test                             Result       Flag       Units Drug Present   Gabapentin                     PRESENT   Topiramate                     PRESENT   Cyclobenzaprine                PRESENT   Desmethylcyclobenzaprine       PRESENT    Desmethylcyclobenzaprine is an expected metabolite of    cyclobenzaprine.   Trazodone                      PRESENT   1,3 chlorophenyl piperazine    PRESENT    1,3-chlorophenyl piperazine is an expected metabolite of    trazodone. ==================================================================== Test                      Result    Flag   Units      Ref Range   Creatinine              127              mg/dL      >=20 ==================================================================== Declared Medications:  Medication list was not provided. ==================================================================== For clinical consultation, please call [phone]. ====================================================================    Laboratory Chemistry Profile (12 mo)  Renal: 01/22/2019: BUN 13; Creatinine, Ser 0.68  Lab Results  Component Value Date   GFRAA >60 01/22/2019   GFRNONAA >60 01/22/2019   Hepatic: 01/22/2019: Albumin 3.8 Lab Results  Component Value Date   AST 20 01/22/2019   ALT 24 01/22/2019   Other: 09/12/2018: CRP <0.8; Sed Rate 16 02/11/2019: Vitamin B-12 397 Note: Above Lab results reviewed.  Imaging  Last 90 days:  Mm Diag Breast  Tomo Uni Left  Result Date: 02/11/2019 CLINICAL DATA:  58 year old presenting with a palpable mass versus palpable normal soft tissue in the LEFT axilla. Personal history of malignant lumpectomy of the UPPER OUTER QUADRANT of the LEFT breast in 2017, pathology DCIS. EXAM: DIGITAL DIAGNOSTIC LEFT MAMMOGRAM WITH CAD AND TOMO ULTRASOUND LEFT AXILLA COMPARISON:  Previous exam(s). ACR Breast Density Category b: There are scattered areas of fibroglandular density. FINDINGS: Tomosynthesis and synthesized full field CC and MLO views of the LEFT breast were obtained. Tomosynthesis and synthesized spot compression tangential view of the area of concern in the LEFT axilla was also obtained. No mammographic abnormality in the LEFT axilla in the area of clinical concern. Post lumpectomy scar/architectural distortion involving the UPPER OUTER QUADRANT of the LEFT breast at POSTERIOR depth. No new or suspicious findings in the LEFT breast. Mammographic images were processed with CAD. On correlative physical exam, there is no palpable mass or lymphadenopathy in the UPPER LEFT axilla. An upper arm muscle is palpated in the area of concern. Targeted LEFT axillary ultrasound is performed, showing a normal-appearing muscle in the area of palpable concern. No mass or pathologic lymphadenopathy is identified. IMPRESSION: 1. No mass or pathologic lymphadenopathy involving the LEFT axilla. 2. No mammographic evidence of malignancy involving the LEFT breast. 3. Expected  post lumpectomy changes involving the LEFT breast. RECOMMENDATION: Annual BILATERAL diagnostic mammography which is due in late December, 2020. I have discussed the findings and recommendations with the patient. If applicable, a reminder letter will be sent to the patient regarding the next appointment. BI-RADS CATEGORY  2: Benign. Electronically Signed   By: Evangeline Dakin M.D.   On: 02/11/2019 12:08   [place] Axilla Left  Result Date: 02/11/2019 CLINICAL DATA:   58 year old presenting with a palpable mass versus palpable normal soft tissue in the LEFT axilla. Personal history of malignant lumpectomy of the UPPER OUTER QUADRANT of the LEFT breast in 2017, pathology DCIS. EXAM: DIGITAL DIAGNOSTIC LEFT MAMMOGRAM WITH CAD AND TOMO ULTRASOUND LEFT AXILLA COMPARISON:  Previous exam(s). ACR Breast Density Category b: There are scattered areas of fibroglandular density. FINDINGS: Tomosynthesis and synthesized full field CC and MLO views of the LEFT breast were obtained. Tomosynthesis and synthesized spot compression tangential view of the area of concern in the LEFT axilla was also obtained. No mammographic abnormality in the LEFT axilla in the area of clinical concern. Post lumpectomy scar/architectural distortion involving the UPPER OUTER QUADRANT of the LEFT breast at POSTERIOR depth. No new or suspicious findings in the LEFT breast. Mammographic images were processed with CAD. On correlative physical exam, there is no palpable mass or lymphadenopathy in the UPPER LEFT axilla. An upper arm muscle is palpated in the area of concern. Targeted LEFT axillary ultrasound is performed, showing a normal-appearing muscle in the area of palpable concern. No mass or pathologic lymphadenopathy is identified. IMPRESSION: 1. No mass or pathologic lymphadenopathy involving the LEFT axilla. 2. No mammographic evidence of malignancy involving the LEFT breast. 3. Expected post lumpectomy changes involving the LEFT breast. RECOMMENDATION: Annual BILATERAL diagnostic mammography which is due in late December, 2020. I have discussed the findings and recommendations with the patient. If applicable, a reminder letter will be sent to the patient regarding the next appointment. BI-RADS CATEGORY  2: Benign. Electronically Signed   By: Evangeline Dakin M.D.   On: 02/11/2019 12:08     Assessment  The primary encounter diagnosis was Lumbar radiculopathy. Diagnoses of Rheumatoid arthritis involving  multiple sites, unspecified rheumatoid factor presence ([place]), Fibromyalgia, Polyarthralgia, Primary osteoarthritis of right hip, Chronic pain syndrome, and Seronegative arthritis were also pertinent to this visit.  Plan of Care  I am having Victoria [name] start on HYDROcodone-acetaminophen. I am also having her maintain her budesonide-formoterol, albuterol, letrozole, traZODone, dabigatran, Tiotropium Bromide Monohydrate, leflunomide, benzonatate, hydroxychloroquine, omeprazole, cyclobenzaprine, Topiramate (TOPAMAX PO), gabapentin, potassium chloride, atorvastatin, torsemide, ibuprofen, and HYDROcodone-acetaminophen.  Pharmacotherapy (Medications Ordered): Meds ordered this encounter  Medications  . HYDROcodone-acetaminophen ([place]) 7.5-325 MG tablet    Sig: Take 1 tablet by mouth 2 (two) times daily as needed for moderate pain.    Dispense:  45 tablet    Refill:  0  . HYDROcodone-acetaminophen ([place]) 7.5-325 MG tablet    Sig: Take 1 tablet by mouth 2 (two) times daily as needed for moderate pain.    Dispense:  45 tablet    Refill:  0   Follow-up plan:   Return in about 9 weeks (around 05/20/2019) for Medication Management.     Recent Visits Date Type Provider Dept  02/05/19 Procedure visit Gillis Santa, MD [facility]  01/16/19 Office Visit Gillis Santa, MD [facility]  Showing recent visits within past 90 days and meeting all other requirements   Today's Visits Date Type Provider Dept  03/18/19 Office Visit Gillis Santa,  MD [facility]  Showing today's visits and meeting all other requirements   Future Appointments No visits were found meeting these conditions.  Showing future appointments within next 90 days and meeting all other requirements   I discussed the assessment and treatment plan with the patient. The patient was provided an opportunity to ask questions and all were answered. The patient agreed with the plan and demonstrated an  understanding of the instructions.  Patient advised to call back or seek an in-person evaluation if the symptoms or condition worsens.  Total duration of non-face-to-face encounter: 25 minutes.  Note by: Gillis Santa, MD Date: 03/18/2019; Time: 10:45 AM  Note: This dictation was prepared with Dragon dictation. Any transcriptional errors that may result from this process are unintentional.  Disclaimer:  * Given the special circumstances of the COVID-19 pandemic, the federal government has announced that the Office for Civil Rights (OCR) will exercise its enforcement discretion and will not impose penalties on physicians using telehealth in the event of noncompliance with regulatory requirements under the [place] and [place] (HIPAA) in connection with the good faith provision of telehealth during the HGDJM-42 national public health emergency. ([place])

## 2019-03-19 ENCOUNTER — Other Ambulatory Visit: Payer: Self-pay

## 2019-03-19 ENCOUNTER — Ambulatory Visit: Payer: Medicare Other | Admitting: Occupational Therapy

## 2019-03-19 DIAGNOSIS — I890 Lymphedema, not elsewhere classified: Secondary | ICD-10-CM

## 2019-03-19 NOTE — Therapy (Signed)
[place] MAIN [facility] SERVICES [address] [place], [place], [address] Phone: [phone]   Fax:  [phone]  Occupational Therapy Treatment  Patient Details  Name: Victoria [name] MRN: [id number] Date of Birth: [birth year] Referring Provider (OT): Marlowe Sax, MD   Encounter Date: 03/19/2019  OT End of Session - 03/19/19 1447    Visit Number  9    Number of Visits  36    Date for OT Re-Evaluation  05/11/19    OT Start Time  0108    OT Stop Time  0205    OT Time Calculation (min)  57 min    Activity Tolerance  Patient tolerated treatment well;No increased pain    Behavior During Therapy  WFL for tasks assessed/performed       Past Medical History:  Diagnosis Date  . Asthma   . Cancer (HCC)    Breast Left, DCIS   . Collagen vascular disease ([place])   . COPD (chronic obstructive pulmonary disease) ([place])   . Diverticulitis   . Fibromyalgia   . GERD (gastroesophageal reflux disease)   . Hearing impaired person, left   . IBS (irritable bowel syndrome)   . Lupus ([place])   . Mitral valve disorder   . MS (multiple sclerosis) ([place])   . Neuromuscular disorder ([place])   . Osteoarthritis   . Personal history of chemotherapy   . Personal history of radiation therapy 2017   left   . Rheumatoid arthritis ([place])   . Seizures ([place])     Past Surgical History:  Procedure Laterality Date  . ABDOMINAL HYSTERECTOMY    . APPENDECTOMY    . BREAST EXCISIONAL BIOPSY Left 2017   lumpectomy with radation  . BREAST EXCISIONAL BIOPSY Left 2004   neg surgical bx   . BREAST SURGERY    . CHOLECYSTECTOMY    . PATENT DUCTUS ARTERIOUS REPAIR    . RECONSTRUCTION TENDON PULLEY HAND Left   . TOTAL HIP ARTHROPLASTY Right     There were no vitals filed for this visit.  Subjective Assessment - 03/19/19 1443    Subjective   [name] presents to OT visit 9/ 36 to address BLE lipo-lymphedema. Pt presents without compression wraps in place. Pt  agrees with OT observation that LLE appears more swollen today. She agrees with plan to complete LLE custom compression garment measurements at next visit . She also states she would like to pursue a trial with basic tactile pneumatic device. Vendor contacted after session to coordinate.    Pertinent History  Complex medical history includes several systemic, inflammatory conditions, including lupus, OA, RA, fibromyalgia. Complex medical history also includes additional contributing conditions including obesity,  hx recurrent DVT and single PE, hx LE cellulitis, asthma and COPD , Hx L breast Ca w SNLB, (Stage 0/ sub clinical LUE/LUQ lymphedema)  partial mastectomy and XRT (2017), MS, and R THA 2009.    Limitations  chronic B leg swelling and associated pain, difficulty walking, impaired transfers and functional mobility, decreased balance,, decreaased joint AROM 2/2 sweling at hips, knees ankles and toes ( difficulty bending to reach feet to perform LB bathing, skin and nail care,  and LB dressing) impaired activity tolerance  for activities requiring standing and/or walking > 20 minutes, (shopping, home management chores, cooking). Impaired body image. Impaired social participation    Repetition  Increases Symptoms    Special Tests  + Stemmer sign B feet    Pain Onset  Other (comment)  chronic                  OT Treatments/Exercises (OP) - 03/19/19 0001      ADLs   ADL Education Given  Yes      Manual Therapy   Manual Therapy  Edema management;Compression Bandaging;Manual Lymphatic Drainage (MLD)    Manual Lymphatic Drainage (MLD)  LLE MLD as established    Compression Bandaging  12 and 15 cm wide x 5 meter short stretch wrapps applied over single layer knee length cotton stockinett then 0.04 mm thick RosidaL FOAM, USING CIRCUMFERENTIAL GRADIENT TECHNIQUES.             OT Education - 03/19/19 1446    Education Details  Cont Pt edu throughout session for LE self care home  protocol. Discussed pump trial.    Person(s) Educated  Patient    Methods  Explanation;Demonstration;Handout    Comprehension  Verbalized understanding;Returned demonstration;Need further instruction          OT Long Term Goals - 02/25/19 1019      OT LONG TERM GOAL #1   Title  Pt will demonstrate modified independance with lymphedema (LE) precautions and prevention strategies by identifying 6  items from list provided in LE Workbook  by 4th OT Rx visit to limit LE progression.    Baseline  Max A MET 02/23/19    Time  4   4th OT rx visit   Period  Days    Status  Achieved      OT LONG TERM GOAL #2   Title  Pt will be able to apply  knee length, multi-layer, short stretch compression wraps using correct gradient techniques with mod CG assistance  to achieve optimal limb volume reduction, and to return affected limb , as closely as possible, to premorbid size and shape, to limit infection risk and to improve tissue integrity.    Baseline  dependent 02/23/19 GOAL MET    Time  4    Period  Days    Status  Achieved      OT LONG TERM GOAL #3   Title  Pt to achieve at least 10% limb volume reduction below knees bilaterally  during Intensive Phase CDT  to improve tissue elasticity needed for optimal lymphatic function and joint AROM, , to return limbs to premorbid size and shape, to improve functional transfers and mobility, and to decrease infection risk.    Baseline  Max A    Time  12    Period  Weeks    Status  New      OT LONG TERM GOAL #4   Title  Pt will achieve  100% compliance with daily LE self-care program with mod  caregiver assistance throughout Intensive Phase CDT, including daily  skin care, lymphatic pumping therex, simple self-MLD and prescribe compression to ensure optimal limb volume reduction, to reduce infection risk and to limit LE progression for optimal functional performance in all occupational domains.    Baseline  Max A    Time  12    Period  Weeks    Status   New      OT LONG TERM GOAL #5   Title  Pt will demonstrate modified independence using assistive devices and extra time to don and doff properly fitting, custom compression garments/devices for optimal LE self-management over time.    Baseline  Max A    Time  12    Period  Weeks    Status  New      OT LONG TERM GOAL #6   Title  Pt will retain clinical BLE limb volume reductions over 6 months following intensive phase CDT ( management phase) with mod A from CG and by Pt consistently following all home program components daily to limit LE progression and further functional decline.    Baseline  Dependent    Time  6    Period  Months    Status  New            Plan - 03/19/19 1448    Clinical Impression Statement  Provided MLD and compression wraps to LLE as established in effort to reduce mildly increased LLE swelling. Pt agrees she is ready to procede with LLE custom garment measurements and fitting as limb volume reduction appears to have reached a clinical plateau. Cont as per POC.    OT Occupational Profile and History  Comprehensive Assessment- Review of records and extensive additional review of physical, cognitive, psychosocial history related to current functional performance    Occupational performance deficits (Please refer to evaluation for details):  ADL's;IADL's;Social Participation;Leisure;Work;Play    Body Structure / Function / Physical Skills  ADL;Decreased knowledge of precautions;Flexibility;ROM;Balance;Decreased knowledge of use of DME;Mobility;Edema;Skin integrity;Obesity;Strength;IADL    Psychosocial Skills  Coping Strategies;Habits    Rehab Potential  Good    Clinical Decision Making  Multiple treatment options, significant modification of task necessary    Comorbidities Affecting Occupational Performance:  Presence of comorbidities impacting occupational performance    Comorbidities impacting occupational performance description:  see SUBJECTIVE    Modification  or Assistance to Complete Evaluation   Min-Moderate modification of tasks or assist with assess necessary to complete eval    OT Frequency  3x / week    OT Duration  12 weeks   and PRN to address bilateral involvement-one leg at a time to limit falls risk   OT Treatment/Interventions  Self-care/ADL training;Therapeutic exercise;Manual lymph drainage;Coping strategies training;Other (comment);Patient/family education;Compression bandaging;Therapeutic activities;Manual Therapy;DME and/or AE instruction    Plan  CDT: manua lymphatic drainage (MLD, skin care, lymphatic pumping therex, gradient compression wraps and compression garments / devices)    Consulted and Agree with Plan of Care  Patient       Patient will benefit from skilled therapeutic intervention in order to improve the following deficits and impairments:   Body Structure / Function / Physical Skills: ADL, Decreased knowledge of precautions, Flexibility, ROM, Balance, Decreased knowledge of use of DME, Mobility, Edema, Skin integrity, Obesity, Strength, IADL   Psychosocial Skills: Coping Strategies, Habits   Visit Diagnosis: 1. Lymphedema, not elsewhere classified       Problem List Patient Active Problem List   Diagnosis Date Noted  . Thrombocytopenia ([place]) 01/23/2019  . Leukopenia 01/23/2019  . Lymphedema of both lower extremities 01/16/2019  . Polyarthralgia 10/14/2018  . Headache disorder 10/14/2018  . Chronic daily headache 10/11/2018  . COPD (chronic obstructive pulmonary disease) ([place]) 10/11/2018  . Thoracic aortic atherosclerosis ([place]) 10/11/2018  . Multiple episodes of deep venous thrombosis ([place]) 09/17/2018  . IBS (irritable bowel syndrome) 09/17/2018  . Healthcare maintenance 09/17/2018  . Primary osteoarthritis of right hip 09/03/2018  . Presence of right hip implant 09/03/2018  . Morbid obesity with BMI of 45.0-49.9, adult ([place]) 08/23/2018  . Shortness of breath 08/22/2018  . History of ductal carcinoma  in situ (DCIS) of breast 07/20/2018  . Aromatase inhibitor use 07/20/2018  . Abnormal findings on auscultation 07/16/2018  . Encounter for long-term (current) use  of high-risk medication 07/16/2018  . Fibromyalgia 07/16/2018  . Other forms of systemic lupus erythematosus ([place]) 07/16/2018  . Other osteoporosis without current pathological fracture 07/16/2018  . Seronegative arthritis 07/16/2018    Andrey Spearman, MS, OTR/L, [facility] 03/19/19 2:50 PM  [place] MAIN [facility] SERVICES [address] [place], [place], [address] Phone: [phone]   Fax:  [phone]  Name: Fleta Borgeson MRN: [id number] Date of Birth: 1961/01/05

## 2019-03-21 ENCOUNTER — Other Ambulatory Visit: Payer: Self-pay

## 2019-03-21 ENCOUNTER — Ambulatory Visit: Payer: Medicare Other | Admitting: Occupational Therapy

## 2019-03-21 DIAGNOSIS — I890 Lymphedema, not elsewhere classified: Secondary | ICD-10-CM | POA: Diagnosis not present

## 2019-03-21 NOTE — Therapy (Addendum)
[place] MAIN [facility] SERVICES [address] [place], [place], [address] Phone: [phone]   Fax:  [phone]  Occupational Therapy Treatment  Note and Progress Report  Patient Details  Name: Victoria [name] MRN: [id number] Date of Birth: April 17, 1961 Referring Provider (OT): Marlowe Sax, MD   Encounter Date: 03/21/2019  OT End of Session - 03/21/19 1044    Visit Number  10    Number of Visits  36    Date for OT Re-Evaluation  05/11/19    OT Start Time  0900    OT Stop Time  1000    OT Time Calculation (min)  60 min    Activity Tolerance  Patient tolerated treatment well;No increased pain    Behavior During Therapy  WFL for tasks assessed/performed       Past Medical History:  Diagnosis Date  . Asthma   . Cancer (HCC)    Breast Left, DCIS   . Collagen vascular disease ([place])   . COPD (chronic obstructive pulmonary disease) ([place])   . Diverticulitis   . Fibromyalgia   . GERD (gastroesophageal reflux disease)   . Hearing impaired person, left   . IBS (irritable bowel syndrome)   . Lupus ([place])   . Mitral valve disorder   . MS (multiple sclerosis) ([place])   . Neuromuscular disorder ([place])   . Osteoarthritis   . Personal history of chemotherapy   . Personal history of radiation therapy 2017   left   . Rheumatoid arthritis ([place])   . Seizures ([place])     Past Surgical History:  Procedure Laterality Date  . ABDOMINAL HYSTERECTOMY    . APPENDECTOMY    . BREAST EXCISIONAL BIOPSY Left 2017   lumpectomy with radation  . BREAST EXCISIONAL BIOPSY Left 2004   neg surgical bx   . BREAST SURGERY    . CHOLECYSTECTOMY    . PATENT DUCTUS ARTERIOUS REPAIR    . RECONSTRUCTION TENDON PULLEY HAND Left   . TOTAL HIP ARTHROPLASTY Right     There were no vitals filed for this visit.  Subjective Assessment - 03/21/19 7782    Subjective   Victoria [name] presents to OT visit 10/ 36 to address BLE lipo-lymphedema. Pt is accompanied by her  friend, [place].    Pertinent History  Complex medical history includes several systemic, inflammatory conditions, including lupus, OA, RA, fibromyalgia. Complex medical history also includes additional contributing conditions including obesity,  hx recurrent DVT and single PE, hx LE cellulitis, asthma and COPD , Hx L breast Ca w SNLB, (Stage 0/ sub clinical LUE/LUQ lymphedema)  partial mastectomy and XRT (2017), MS, and R THA 2009.    Limitations  chronic B leg swelling and associated pain, difficulty walking, impaired transfers and functional mobility, decreased balance,, decreaased joint AROM 2/2 sweling at hips, knees ankles and toes ( difficulty bending to reach feet to perform LB bathing, skin and nail care,  and LB dressing) impaired activity tolerance  for activities requiring standing and/or walking > 20 minutes, (shopping, home management chores, cooking). Impaired body image. Impaired social participation    Repetition  Increases Symptoms    Special Tests  + Stemmer sign B feet    Pain Onset  Other (comment)   chronic                  OT Treatments/Exercises (OP) - 03/21/19 0001      ADLs   ADL Education Given  Yes  (Pended)  Manual Therapy   Manual Therapy  Edema management;Compression Bandaging  (Pended)     Edema Management  completed anatomical measurements for LLE alternative daytime compression garments- Circaid  (Pended)     Compression Bandaging  12 and 15 cm wide x 5 meter short stretch wrapps applied over single layer knee length cotton stockinett then 0.04 mm thick RosidaL FOAM, USING CIRCUMFERENTIAL GRADIENT TECHNIQUES.  (Pended)              OT Education - 03/21/19 1044    Education Details  Cont Pt edu throughout session for LE self care home protocol. Discussed pump trial.    Person(s) Educated  Patient    Methods  Explanation;Demonstration;Handout    Comprehension  Verbalized understanding;Returned demonstration;Need further instruction           OT Long Term Goals - 03/21/19 1045      OT LONG TERM GOAL #1   Title  Pt will demonstrate modified independance with lymphedema (LE) precautions and prevention strategies by identifying 6  items from list provided in LE Workbook  by 4th OT Rx visit to limit LE progression.    Baseline  Max A MET 02/23/19    Time  4   4th OT rx visit   Period  Days    Status  Achieved      OT LONG TERM GOAL #2   Title  Pt will be able to apply  knee length, multi-layer, short stretch compression wraps using correct gradient techniques with mod CG assistance  to achieve optimal limb volume reduction, and to return affected limb , as closely as possible, to premorbid size and shape, to limit infection risk and to improve tissue integrity.    Baseline  dependent 02/23/19 GOAL MET    Time  4    Period  Days    Status  Achieved      OT LONG TERM GOAL #3   Title  Pt to achieve at least 10% limb volume reduction below knees bilaterally  during Intensive Phase CDT  to improve tissue elasticity needed for optimal lymphatic function and joint AROM, , to return limbs to premorbid size and shape, to improve functional transfers and mobility, and to decrease infection risk.    Baseline  Max A 03/21/19: partially achieved with 13.93% volume reduction below the knee on L. RLE CDT  will commence once LLE garments are fitted . They are ordered.    Time  12    Period  Weeks    Status  Partially Met      OT LONG TERM GOAL #4   Title  Pt will achieve  100% compliance with daily LE self-care program with mod  caregiver assistance throughout Intensive Phase CDT, including daily  skin care, lymphatic pumping therex, simple self-MLD and prescribe compression to ensure optimal limb volume reduction, to reduce infection risk and to limit LE progression for optimal functional performance in all occupational domains.    Baseline  Max A    Time  12    Period  Weeks    Status  Achieved      OT LONG TERM GOAL #5   Title  Pt  will demonstrate modified independence using assistive devices and extra time to don and doff properly fitting, custom compression garments/devices for optimal LE self-management over time.    Baseline  Max A    Time  12    Period  Weeks    Status  On-going  OT LONG TERM GOAL #6   Title  Pt will retain clinical BLE limb volume reductions over 6 months following intensive phase CDT ( management phase) with mod A from CG and by Pt consistently following all home program components daily to limit LE progression and further functional decline.    Baseline  Dependent    Time  6    Period  Months    Status  On-going            Plan - 03/21/19 1048    Clinical Impression Statement  Pt continues to demonstrate steady progress towards all OT goals for lymphedema care. See LONG TERM GOALS for specifics  noted today for progress report. Completed anatomical measurements for LLE alternative, velcro wrap style, daytime compression garments (Circaid Juxta-Lite A-D).  Applied compression wraps to LLE as established after measuring. Good tolerance for all theraies today. Cont as per POC.    OT Occupational Profile and History  Comprehensive Assessment- Review of records and extensive additional review of physical, cognitive, psychosocial history related to current functional performance    Occupational performance deficits (Please refer to evaluation for details):  ADL's;IADL's;Social Participation;Leisure;Work;Play    Body Structure / Function / Physical Skills  ADL;Decreased knowledge of precautions;Flexibility;ROM;Balance;Decreased knowledge of use of DME;Mobility;Edema;Skin integrity;Obesity;Strength;IADL    Psychosocial Skills  Coping Strategies;Habits    Rehab Potential  Good    Clinical Decision Making  Multiple treatment options, significant modification of task necessary    Comorbidities Affecting Occupational Performance:  Presence of comorbidities impacting occupational performance     Comorbidities impacting occupational performance description:  see SUBJECTIVE    Modification or Assistance to Complete Evaluation   Min-Moderate modification of tasks or assist with assess necessary to complete eval    OT Frequency  3x / week    OT Duration  12 weeks   and PRN to address bilateral involvement-one leg at a time to limit falls risk   OT Treatment/Interventions  Self-care/ADL training;Therapeutic exercise;Manual lymph drainage;Coping strategies training;Other (comment);Patient/family education;Compression bandaging;Therapeutic activities;Manual Therapy;DME and/or AE instruction    Plan  CDT: manua lymphatic drainage (MLD, skin care, lymphatic pumping therex, gradient compression wraps and compression garments / devices)    Consulted and Agree with Plan of Care  Patient       Patient will benefit from skilled therapeutic intervention in order to improve the following deficits and impairments:   Body Structure / Function / Physical Skills: ADL, Decreased knowledge of precautions, Flexibility, ROM, Balance, Decreased knowledge of use of DME, Mobility, Edema, Skin integrity, Obesity, Strength, IADL   Psychosocial Skills: Coping Strategies, Habits   Visit Diagnosis: 1. Lymphedema, not elsewhere classified       Problem List Patient Active Problem List   Diagnosis Date Noted  . Thrombocytopenia ([place]) 01/23/2019  . Leukopenia 01/23/2019  . Lymphedema of both lower extremities 01/16/2019  . Polyarthralgia 10/14/2018  . Headache disorder 10/14/2018  . Chronic daily headache 10/11/2018  . COPD (chronic obstructive pulmonary disease) ([place]) 10/11/2018  . Thoracic aortic atherosclerosis ([place]) 10/11/2018  . Multiple episodes of deep venous thrombosis ([place]) 09/17/2018  . IBS (irritable bowel syndrome) 09/17/2018  . Healthcare maintenance 09/17/2018  . Primary osteoarthritis of right hip 09/03/2018  . Presence of right hip implant 09/03/2018  . Morbid obesity with BMI of  45.0-49.9, adult ([place]) 08/23/2018  . Shortness of breath 08/22/2018  . History of ductal carcinoma in situ (DCIS) of breast 07/20/2018  . Aromatase inhibitor use 07/20/2018  . Abnormal findings on auscultation  07/16/2018  . Encounter for long-term (current) use of high-risk medication 07/16/2018  . Fibromyalgia 07/16/2018  . Other forms of systemic lupus erythematosus ([place]) 07/16/2018  . Other osteoporosis without current pathological fracture 07/16/2018  . Seronegative arthritis 07/16/2018    Andrey Spearman, MS, OTR/L, [facility] 03/21/19 10:52 AM   [place] MAIN [facility] SERVICES [address] [place], [place], [address] Phone: [phone]   Fax:  [phone]  Name: Yareth Kearse MRN: [id number] Date of Birth: 1960/12/21

## 2019-03-25 ENCOUNTER — Ambulatory Visit: Payer: Medicare Other | Admitting: Occupational Therapy

## 2019-03-25 ENCOUNTER — Other Ambulatory Visit: Payer: Self-pay

## 2019-03-25 DIAGNOSIS — I890 Lymphedema, not elsewhere classified: Secondary | ICD-10-CM

## 2019-03-25 NOTE — Therapy (Signed)
[place] MAIN [facility] SERVICES [address] [place], [place], [address] Phone: [phone]   Fax:  [phone]  Occupational Therapy Treatment  Patient Details  Name: Victoria [name] MRN: [id number] Date of Birth: January 25, 1961 Referring Provider (OT): Marlowe Sax, MD   Encounter Date: 03/25/2019  OT End of Session - 03/25/19 1140    Visit Number  11    Number of Visits  36    Date for OT Re-Evaluation  05/11/19    OT Start Time  1105    OT Stop Time  1205    OT Time Calculation (min)  60 min    Activity Tolerance  Patient tolerated treatment well;No increased pain    Behavior During Therapy  WFL for tasks assessed/performed       Past Medical History:  Diagnosis Date  . Asthma   . Cancer (HCC)    Breast Left, DCIS   . Collagen vascular disease ([place])   . COPD (chronic obstructive pulmonary disease) ([place])   . Diverticulitis   . Fibromyalgia   . GERD (gastroesophageal reflux disease)   . Hearing impaired person, left   . IBS (irritable bowel syndrome)   . Lupus ([place])   . Mitral valve disorder   . Victoria (multiple sclerosis) ([place])   . Neuromuscular disorder ([place])   . Osteoarthritis   . Personal history of chemotherapy   . Personal history of radiation therapy 2017   left   . Rheumatoid arthritis ([place])   . Seizures ([place])     Past Surgical History:  Procedure Laterality Date  . ABDOMINAL HYSTERECTOMY    . APPENDECTOMY    . BREAST EXCISIONAL BIOPSY Left 2017   lumpectomy with radation  . BREAST EXCISIONAL BIOPSY Left 2004   neg surgical bx   . BREAST SURGERY    . CHOLECYSTECTOMY    . PATENT DUCTUS ARTERIOUS REPAIR    . RECONSTRUCTION TENDON PULLEY HAND Left   . TOTAL HIP ARTHROPLASTY Right     There were no vitals filed for this visit.  Subjective Assessment - 03/25/19 1112    Subjective   Victoria [name] presents to OT visit 11/ 36 to address BLE lipo-lymphedema.Manufacturer's rep for [employer], Jerrell Mylar, is here today to assist  w/ trial of basic , thigh length, 6 chambered, sequential pneumatic compression device ("Pump").    Pertinent History  Complex medical history includes several systemic, inflammatory conditions, including lupus, OA, RA, fibromyalgia. Complex medical history also includes additional contributing conditions including obesity,  hx recurrent DVT and single PE, hx LE cellulitis, asthma and COPD , Hx L breast Ca w SNLB, (Stage 0/ sub clinical LUE/LUQ lymphedema)  partial mastectomy and XRT (2017), Victoria, and R THA 2009.    Limitations  chronic B leg swelling and associated pain, difficulty walking, impaired transfers and functional mobility, decreased balance,, decreaased joint AROM 2/2 sweling at hips, knees ankles and toes ( difficulty bending to reach feet to perform LB bathing, skin and nail care,  and LB dressing) impaired activity tolerance  for activities requiring standing and/or walking > 20 minutes, (shopping, home management chores, cooking). Impaired body image. Impaired social participation    Repetition  Increases Symptoms    Special Tests  + Stemmer sign B feet    Pain Onset  Other (comment)   chronic                  OT Treatments/Exercises (OP) - 03/25/19 0001  ADLs   ADL Education Given  Yes      Manual Therapy   Manual Therapy  Edema management;Manual Lymphatic Drainage (MLD);Compression Bandaging    Manual Lymphatic Drainage (MLD)  trial with pneumatic compression "pump"    Compression Bandaging  12 and 15 cm wide x 5 meter short stretch wrapps applied over single layer knee length cotton stockinett then 0.04 mm thick RosidaL FOAM, USING CIRCUMFERENTIAL GRADIENT TECHNIQUES.             OT Education - 03/25/19 1126    Education Details  Pt education for various types of pneumatic compression devices and why Flexitouch is recommended in her case, verses 6 chamber pump    Person(s) Educated  Patient    Methods   Explanation;Demonstration;Handout    Comprehension  Verbalized understanding;Returned demonstration;Need further instruction          OT Long Term Goals - 03/21/19 1045      OT LONG TERM GOAL #1   Title  Pt will demonstrate modified independance with lymphedema (LE) precautions and prevention strategies by identifying 6  items from list provided in LE Workbook  by 4th OT Rx visit to limit LE progression.    Baseline  Max A MET 02/23/19    Time  4   4th OT rx visit   Period  Days    Status  Achieved      OT LONG TERM GOAL #2   Title  Pt will be able to apply  knee length, multi-layer, short stretch compression wraps using correct gradient techniques with mod CG assistance  to achieve optimal limb volume reduction, and to return affected limb , as closely as possible, to premorbid size and shape, to limit infection risk and to improve tissue integrity.    Baseline  dependent 02/23/19 GOAL MET    Time  4    Period  Days    Status  Achieved      OT LONG TERM GOAL #3   Title  Pt to achieve at least 10% limb volume reduction below knees bilaterally  during Intensive Phase CDT  to improve tissue elasticity needed for optimal lymphatic function and joint AROM, , to return limbs to premorbid size and shape, to improve functional transfers and mobility, and to decrease infection risk.    Baseline  Max A 03/21/19: partially achieved with 13.93% volume reduction below the knee on L. RLE CDT  will commence once LLE garments are fitted . They are ordered.    Time  12    Period  Weeks    Status  Partially Met      OT LONG TERM GOAL #4   Title  Pt will achieve  100% compliance with daily LE self-care program with mod  caregiver assistance throughout Intensive Phase CDT, including daily  skin care, lymphatic pumping therex, simple self-MLD and prescribe compression to ensure optimal limb volume reduction, to reduce infection risk and to limit LE progression for optimal functional performance in all  occupational domains.    Baseline  Max A    Time  12    Period  Weeks    Status  Achieved      OT LONG TERM GOAL #5   Title  Pt will demonstrate modified independence using assistive devices and extra time to don and doff properly fitting, custom compression garments/devices for optimal LE self-management over time.    Baseline  Max A    Time  12    Period  Weeks    Status  On-going      OT LONG TERM GOAL #6   Title  Pt will retain clinical BLE limb volume reductions over 6 months following intensive phase CDT ( management phase) with mod A from CG and by Pt consistently following all home program components daily to limit LE progression and further functional decline.    Baseline  Dependent    Time  6    Period  Months    Status  On-going            Plan - 03/25/19 1244    Clinical Impression Statement  Manufacturer's rep for [employer] is present throughout OT treatment session today to assist Victoria [name] w/ a trial of a basic, thigh  length, 6-chambered sequential pneumatic compression device ("pump"). Pt- underwent a 30-minute trial on the full left lower extremity using low compression. She reported pain at the distal leg and foot due to sensitive fatty deposits consistent with BLE sever lipo-lymphedema.    OT Occupational Profile and History  Comprehensive Assessment- Review of records and extensive additional review of physical, cognitive, psychosocial history related to current functional performance    Occupational performance deficits (Please refer to evaluation for details):  ADL's;IADL's;Social Participation;Leisure;Work;Play    Body Structure / Function / Physical Skills  ADL;Decreased knowledge of precautions;Flexibility;ROM;Balance;Decreased knowledge of use of DME;Mobility;Edema;Skin integrity;Obesity;Strength;IADL    Psychosocial Skills  Coping Strategies;Habits    Rehab Potential  Good    Clinical Decision Making  Multiple treatment options, significant  modification of task necessary    Comorbidities Affecting Occupational Performance:  Presence of comorbidities impacting occupational performance    Comorbidities impacting occupational performance description:  see SUBJECTIVE    Modification or Assistance to Complete Evaluation   Min-Moderate modification of tasks or assist with assess necessary to complete eval    OT Frequency  3x / week    OT Duration  12 weeks   and PRN to address bilateral involvement-one leg at a time to limit falls risk   OT Treatment/Interventions  Self-care/ADL training;Therapeutic exercise;Manual lymph drainage;Coping strategies training;Other (comment);Patient/family education;Compression bandaging;Therapeutic activities;Manual Therapy;DME and/or AE instruction    Plan  CDT: manua lymphatic drainage (MLD, skin care, lymphatic pumping therex, gradient compression wraps and compression garments / devices)    Consulted and Agree with Plan of Care  Patient       Patient will benefit from skilled therapeutic intervention in order to improve the following deficits and impairments:   Body Structure / Function / Physical Skills: ADL, Decreased knowledge of precautions, Flexibility, ROM, Balance, Decreased knowledge of use of DME, Mobility, Edema, Skin integrity, Obesity, Strength, IADL   Psychosocial Skills: Coping Strategies, Habits   Visit Diagnosis: 1. Lymphedema, not elsewhere classified       Problem List Patient Active Problem List   Diagnosis Date Noted  . Thrombocytopenia ([address]) 01/23/2019  . Leukopenia 01/23/2019  . Lymphedema of both lower extremities 01/16/2019  . Polyarthralgia 10/14/2018  . Headache disorder 10/14/2018  . Chronic daily headache 10/11/2018  . COPD (chronic obstructive pulmonary disease) ([place]) 10/11/2018  . Thoracic aortic atherosclerosis ([place]) 10/11/2018  . Multiple episodes of deep venous thrombosis ([place]) 09/17/2018  . IBS (irritable bowel syndrome) 09/17/2018  . Healthcare  maintenance 09/17/2018  . Primary osteoarthritis of right hip 09/03/2018  . Presence of right hip implant 09/03/2018  . Morbid obesity with BMI of 45.0-49.9, adult ([place]) 08/23/2018  . Shortness of breath 08/22/2018  . 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 Aromatase inhibitor use 07/20/2018  . Abnormal findings on auscultation 07/16/2018  . Encounter for long-term (current) use of high-risk medication 07/16/2018  . Fibromyalgia 07/16/2018  . Other forms of systemic lupus erythematosus ([place]) 07/16/2018  . Other osteoporosis without current pathological fracture 07/16/2018  . Seronegative arthritis 07/16/2018    Andrey Spearman, [place], OTR/L, [facility] 03/25/19 12:46 PM    [place] MAIN [facility] SERVICES [address] [place], [place], [address] Phone: [phone]   Fax:  [phone]  Name: Victoria [name] MRN: [id number] Date of Birth: November 14, 1960

## 2019-03-26 ENCOUNTER — Ambulatory Visit: Payer: Medicare Other | Admitting: Occupational Therapy

## 2019-03-26 ENCOUNTER — Other Ambulatory Visit: Payer: Self-pay

## 2019-03-26 DIAGNOSIS — I890 Lymphedema, not elsewhere classified: Secondary | ICD-10-CM | POA: Diagnosis not present

## 2019-03-26 NOTE — Therapy (Signed)
[place] MAIN [facility] SERVICES [address] [place], [place], [address] Phone: [phone]   Fax:  [phone]  Occupational Therapy Treatment  Patient Details  Name: Victoria [name] MRN: [id number] Date of Birth: October 17, 1960 Referring Provider (OT): Marlowe Sax, MD   Encounter Date: 03/26/2019  OT End of Session - 03/26/19 1212    Visit Number  12    Number of Visits  36    Date for OT Re-Evaluation  05/11/19    OT Start Time  0915    OT Stop Time  1005    OT Time Calculation (min)  50 min    Activity Tolerance  Patient tolerated treatment well;No increased pain    Behavior During Therapy  WFL for tasks assessed/performed       Past Medical History:  Diagnosis Date  . Asthma   . Cancer (HCC)    Breast Left, DCIS   . Collagen vascular disease ([place])   . COPD (chronic obstructive pulmonary disease) ([place])   . Diverticulitis   . Fibromyalgia   . GERD (gastroesophageal reflux disease)   . Hearing impaired person, left   . IBS (irritable bowel syndrome)   . Lupus ([place])   . Mitral valve disorder   . MS (multiple sclerosis) ([place])   . Neuromuscular disorder ([place])   . Osteoarthritis   . Personal history of chemotherapy   . Personal history of radiation therapy 2017   left   . Rheumatoid arthritis ([place])   . Seizures ([place])     Past Surgical History:  Procedure Laterality Date  . ABDOMINAL HYSTERECTOMY    . APPENDECTOMY    . BREAST EXCISIONAL BIOPSY Left 2017   lumpectomy with radation  . BREAST EXCISIONAL BIOPSY Left 2004   neg surgical bx   . BREAST SURGERY    . CHOLECYSTECTOMY    . PATENT DUCTUS ARTERIOUS REPAIR    . RECONSTRUCTION TENDON PULLEY HAND Left   . TOTAL HIP ARTHROPLASTY Right     There were no vitals filed for this visit.  Subjective Assessment - 03/26/19 0917    Subjective   Ms Castelli presents for OT visit 12/36 to address BLE lipo-lymphedema. Pt is 15 minutes late for session. Pt presents with LLE  compressionw raps applied at clinic yesterday in place. Pt reports 7/10 generalized arthristis pain    Pertinent History  Complex medical history includes several systemic, inflammatory conditions, including lupus, OA, RA, fibromyalgia. Complex medical history also includes additional contributing conditions including obesity,  hx recurrent DVT and single PE, hx LE cellulitis, asthma and COPD , Hx L breast Ca w SNLB, (Stage 0/ sub clinical LUE/LUQ lymphedema)  partial mastectomy and XRT (2017), [place], and R THA 2009.    Limitations  chronic B leg swelling and associated pain, difficulty walking, impaired transfers and functional mobility, decreased balance,, decreaased joint AROM 2/2 sweling at hips, knees ankles and toes ( difficulty bending to reach feet to perform LB bathing, skin and nail care,  and LB dressing) impaired activity tolerance  for activities requiring standing and/or walking > 20 minutes, (shopping, home management chores, cooking). Impaired body image. Impaired social participation    Repetition  Increases Symptoms    Special Tests  + Stemmer sign B feet    Currently in Pain?  Yes    Pain Score  7    all over; headache   Pain Onset  Other (comment)   chronic  OT Treatments/Exercises (OP) - 03/26/19 0001      ADLs   ADL Education Given  Yes      Manual Therapy   Manual Therapy  Edema management;Manual Lymphatic Drainage (MLD);Compression Bandaging    Manual Lymphatic Drainage (MLD)  Commenced MLD to RLE today using established techniques  used on LLE.     Compression Bandaging  12 and 15 cm wide x 5 meter short stretch wrapps applied over single layer knee length cotton stockinett then 0.04 mm thick RosidaL FOAM, USING CIRCUMFERENTIAL GRADIENT TECHNIQUES.             OT Education - 03/26/19 1210    Education Details  Cont Pt edu throughout session for LE self care home protocol. Discussed pump trial.    Person(s) Educated  Patient     Methods  Explanation;Demonstration;Handout    Comprehension  Verbalized understanding;Returned demonstration;Need further instruction          OT Long Term Goals - 03/21/19 1045      OT LONG TERM GOAL #1   Title  Pt will demonstrate modified independance with lymphedema (LE) precautions and prevention strategies by identifying 6  items from list provided in LE Workbook  by 4th OT Rx visit to limit LE progression.    Baseline  Max A MET 02/23/19    Time  4   4th OT rx visit   Period  Days    Status  Achieved      OT LONG TERM GOAL #2   Title  Pt will be able to apply  knee length, multi-layer, short stretch compression wraps using correct gradient techniques with mod CG assistance  to achieve optimal limb volume reduction, and to return affected limb , as closely as possible, to premorbid size and shape, to limit infection risk and to improve tissue integrity.    Baseline  dependent 02/23/19 GOAL MET    Time  4    Period  Days    Status  Achieved      OT LONG TERM GOAL #3   Title  Pt to achieve at least 10% limb volume reduction below knees bilaterally  during Intensive Phase CDT  to improve tissue elasticity needed for optimal lymphatic function and joint AROM, , to return limbs to premorbid size and shape, to improve functional transfers and mobility, and to decrease infection risk.    Baseline  Max A 03/21/19: partially achieved with 13.93% volume reduction below the knee on L. RLE CDT  will commence once LLE garments are fitted . They are ordered.    Time  12    Period  Weeks    Status  Partially Met      OT LONG TERM GOAL #4   Title  Pt will achieve  100% compliance with daily LE self-care program with mod  caregiver assistance throughout Intensive Phase CDT, including daily  skin care, lymphatic pumping therex, simple self-MLD and prescribe compression to ensure optimal limb volume reduction, to reduce infection risk and to limit LE progression for optimal functional performance in  all occupational domains.    Baseline  Max A    Time  12    Period  Weeks    Status  Achieved      OT LONG TERM GOAL #5   Title  Pt will demonstrate modified independence using assistive devices and extra time to don and doff properly fitting, custom compression garments/devices for optimal LE self-management over time.    Baseline  Max A    Time  12    Period  Weeks    Status  On-going      OT LONG TERM GOAL #6   Title  Pt will retain clinical BLE limb volume reductions over 6 months following intensive phase CDT ( management phase) with mod A from CG and by Pt consistently following all home program components daily to limit LE progression and further functional decline.    Baseline  Dependent    Time  6    Period  Months    Status  On-going            Plan - 03/26/19 1213    Clinical Impression Statement  Pt tolerated MLD to RLE today without difficulty. Applied LLE compression wraps as established in effort to sustain volumetric reduction and tissue softening while awaiting custom LLE garment fitting. Cont as per POC.    OT Occupational Profile and History  Comprehensive Assessment- Review of records and extensive additional review of physical, cognitive, psychosocial history related to current functional performance    Occupational performance deficits (Please refer to evaluation for details):  ADL's;IADL's;Social Participation;Leisure;Work;Play    Body Structure / Function / Physical Skills  ADL;Decreased knowledge of precautions;Flexibility;ROM;Balance;Decreased knowledge of use of DME;Mobility;Edema;Skin integrity;Obesity;Strength;IADL    Psychosocial Skills  Coping Strategies;Habits    Rehab Potential  Good    Clinical Decision Making  Multiple treatment options, significant modification of task necessary    Comorbidities Affecting Occupational Performance:  Presence of comorbidities impacting occupational performance    Comorbidities impacting occupational performance  description:  see SUBJECTIVE    Modification or Assistance to Complete Evaluation   Min-Moderate modification of tasks or assist with assess necessary to complete eval    OT Frequency  3x / week    OT Duration  12 weeks   and PRN to address bilateral involvement-one leg at a time to limit falls risk   OT Treatment/Interventions  Self-care/ADL training;Therapeutic exercise;Manual lymph drainage;Coping strategies training;Other (comment);Patient/family education;Compression bandaging;Therapeutic activities;Manual Therapy;DME and/or AE instruction    Plan  CDT: manua lymphatic drainage (MLD, skin care, lymphatic pumping therex, gradient compression wraps and compression garments / devices)    Consulted and Agree with Plan of Care  Patient       Patient will benefit from skilled therapeutic intervention in order to improve the following deficits and impairments:   Body Structure / Function / Physical Skills: ADL, Decreased knowledge of precautions, Flexibility, ROM, Balance, Decreased knowledge of use of DME, Mobility, Edema, Skin integrity, Obesity, Strength, IADL   Psychosocial Skills: Coping Strategies, Habits   Visit Diagnosis: 1. Lymphedema, not elsewhere classified       Problem List Patient Active Problem List   Diagnosis Date Noted  . Thrombocytopenia ([place]) 01/23/2019  . Leukopenia 01/23/2019  . Lymphedema of both lower extremities 01/16/2019  . Polyarthralgia 10/14/2018  . Headache disorder 10/14/2018  . Chronic daily headache 10/11/2018  . COPD (chronic obstructive pulmonary disease) ([place]) 10/11/2018  . Thoracic aortic atherosclerosis ([place]) 10/11/2018  . Multiple episodes of deep venous thrombosis ([place]) 09/17/2018  . IBS (irritable bowel syndrome) 09/17/2018  . Healthcare maintenance 09/17/2018  . Primary osteoarthritis of right hip 09/03/2018  . Presence of right hip implant 09/03/2018  . Morbid obesity with BMI of 45.0-49.9, adult ([place]) 08/23/2018  . Shortness of  breath 08/22/2018  . History of ductal carcinoma in situ (DCIS) of breast 07/20/2018  . Aromatase inhibitor use 07/20/2018  . Abnormal findings on auscultation 07/16/2018  .  Encounter for long-term (current) use of high-risk medication 07/16/2018  . Fibromyalgia 07/16/2018  . Other forms of systemic lupus erythematosus ([place]) 07/16/2018  . Other osteoporosis without current pathological fracture 07/16/2018  . Seronegative arthritis 07/16/2018    Andrey Spearman, MS, OTR/L, [facility] 03/26/19 12:15 PM  [place] MAIN [facility] SERVICES [address] [place], [place], [address] Phone: [phone]   Fax:  [phone]  Name: Yahaira Bruski MRN: [id number] Date of Birth: [birth year]

## 2019-04-01 ENCOUNTER — Ambulatory Visit: Payer: Medicare Other | Admitting: Occupational Therapy

## 2019-04-01 ENCOUNTER — Other Ambulatory Visit: Payer: Self-pay

## 2019-04-01 DIAGNOSIS — I890 Lymphedema, not elsewhere classified: Secondary | ICD-10-CM

## 2019-04-01 NOTE — Therapy (Signed)
[place] MAIN [facility] SERVICES [address] [place], [place], [address] Phone: [phone]   Fax:  [phone]  Occupational Therapy Treatment  Patient Details  Name: Victoria [name] MRN: [id number] Date of Birth: 10-10-1960 Referring Provider (OT): Marlowe Sax, MD   Encounter Date: 04/01/2019  OT End of Session - 04/01/19 1302    Visit Number  13    Number of Visits  36    Date for OT Re-Evaluation  05/11/19    OT Start Time  0855    OT Stop Time  1000    OT Time Calculation (min)  65 min    Activity Tolerance  Patient tolerated treatment well;No increased pain    Behavior During Therapy  WFL for tasks assessed/performed       Past Medical History:  Diagnosis Date  . Asthma   . Cancer (HCC)    Breast Left, DCIS   . Collagen vascular disease ([place])   . COPD (chronic obstructive pulmonary disease) ([place])   . Diverticulitis   . Fibromyalgia   . GERD (gastroesophageal reflux disease)   . Hearing impaired person, left   . IBS (irritable bowel syndrome)   . Lupus ([place])   . Mitral valve disorder   . MS (multiple sclerosis) ([place])   . Neuromuscular disorder ([place])   . Osteoarthritis   . Personal history of chemotherapy   . Personal history of radiation therapy 2017   left   . Rheumatoid arthritis ([place])   . Seizures ([place])     Past Surgical History:  Procedure Laterality Date  . ABDOMINAL HYSTERECTOMY    . APPENDECTOMY    . BREAST EXCISIONAL BIOPSY Left 2017   lumpectomy with radation  . BREAST EXCISIONAL BIOPSY Left 2004   neg surgical bx   . BREAST SURGERY    . CHOLECYSTECTOMY    . PATENT DUCTUS ARTERIOUS REPAIR    . RECONSTRUCTION TENDON PULLEY HAND Left   . TOTAL HIP ARTHROPLASTY Right     There were no vitals filed for this visit.  Subjective Assessment - 04/01/19 0904    Subjective   Ms Caudell presents for OT visit 13//36 to address BLE lipo-lymphedema. Pt is early for her appointment this morning. Pt  reports generalized pain 6/10. Pt states, "This leg (left) feels really good. I think they are better since we started."    Pertinent History  Complex medical history includes several systemic, inflammatory conditions, including lupus, OA, RA, fibromyalgia. Complex medical history also includes additional contributing conditions including obesity,  hx recurrent DVT and single PE, hx LE cellulitis, asthma and COPD , Hx L breast Ca w SNLB, (Stage 0/ sub clinical LUE/LUQ lymphedema)  partial mastectomy and XRT (2017), MS, and R THA 2009.    Limitations  chronic B leg swelling and associated pain, difficulty walking, impaired transfers and functional mobility, decreased balance,, decreaased joint AROM 2/2 sweling at hips, knees ankles and toes ( difficulty bending to reach feet to perform LB bathing, skin and nail care,  and LB dressing) impaired activity tolerance  for activities requiring standing and/or walking > 20 minutes, (shopping, home management chores, cooking). Impaired body image. Impaired social participation    Repetition  Increases Symptoms    Special Tests  + Stemmer sign B feet    Pain Onset  Other (comment)   chronic                  OT Treatments/Exercises (OP) - 04/01/19 0001  ADLs   ADL Education Given  Yes  (Pended)       Manual Therapy   Manual Therapy  Edema management;Manual Lymphatic Drainage (MLD);Compression Bandaging  (Pended)              OT Education - 04/01/19 1302    Education Details  Cont Pt edu throughout session for LE self care home protocol. Discussed pump trial.    Person(s) Educated  Patient    Methods  Explanation;Demonstration;Handout    Comprehension  Verbalized understanding;Returned demonstration;Need further instruction          OT Long Term Goals - 03/21/19 1045      OT LONG TERM GOAL #1   Title  Pt will demonstrate modified independance with lymphedema (LE) precautions and prevention strategies by identifying 6   items from list provided in LE Workbook  by 4th OT Rx visit to limit LE progression.    Baseline  Max A MET 02/23/19    Time  4   4th OT rx visit   Period  Days    Status  Achieved      OT LONG TERM GOAL #2   Title  Pt will be able to apply  knee length, multi-layer, short stretch compression wraps using correct gradient techniques with mod CG assistance  to achieve optimal limb volume reduction, and to return affected limb , as closely as possible, to premorbid size and shape, to limit infection risk and to improve tissue integrity.    Baseline  dependent 02/23/19 GOAL MET    Time  4    Period  Days    Status  Achieved      OT LONG TERM GOAL #3   Title  Pt to achieve at least 10% limb volume reduction below knees bilaterally  during Intensive Phase CDT  to improve tissue elasticity needed for optimal lymphatic function and joint AROM, , to return limbs to premorbid size and shape, to improve functional transfers and mobility, and to decrease infection risk.    Baseline  Max A 03/21/19: partially achieved with 13.93% volume reduction below the knee on L. RLE CDT  will commence once LLE garments are fitted . They are ordered.    Time  12    Period  Weeks    Status  Partially Met      OT LONG TERM GOAL #4   Title  Pt will achieve  100% compliance with daily LE self-care program with mod  caregiver assistance throughout Intensive Phase CDT, including daily  skin care, lymphatic pumping therex, simple self-MLD and prescribe compression to ensure optimal limb volume reduction, to reduce infection risk and to limit LE progression for optimal functional performance in all occupational domains.    Baseline  Max A    Time  12    Period  Weeks    Status  Achieved      OT LONG TERM GOAL #5   Title  Pt will demonstrate modified independence using assistive devices and extra time to don and doff properly fitting, custom compression garments/devices for optimal LE self-management over time.    Baseline   Max A    Time  12    Period  Weeks    Status  On-going      OT LONG TERM GOAL #6   Title  Pt will retain clinical BLE limb volume reductions over 6 months following intensive phase CDT ( management phase) with mod A from CG and by Pt consistently  following all home program components daily to limit LE progression and further functional decline.    Baseline  Dependent    Time  6    Period  Months    Status  On-going            Plan - 04/01/19 1303    Clinical Impression Statement  Pt reporting pain typically associated with swelling in LLE, current Rx limb, is absent today. Limb volume is reduced significantly such that "pantaloon" shaped, fatty swelling has resolved at bilateral ankles. Pt tolerated MLD and LLE compression wraps today without difficulty. Garments have been ordered for LLE and [employer] preauthorization process is in the cue with vendor.; Cont as per POC.    OT Occupational Profile and History  Comprehensive Assessment- Review of records and extensive additional review of physical, cognitive, psychosocial history related to current functional performance    Occupational performance deficits (Please refer to evaluation for details):  ADL's;IADL's;Social Participation;Leisure;Work;Play    Body Structure / Function / Physical Skills  ADL;Decreased knowledge of precautions;Flexibility;ROM;Balance;Decreased knowledge of use of DME;Mobility;Edema;Skin integrity;Obesity;Strength;IADL    Psychosocial Skills  Coping Strategies;Habits    Rehab Potential  Good    Clinical Decision Making  Multiple treatment options, significant modification of task necessary    Comorbidities Affecting Occupational Performance:  Presence of comorbidities impacting occupational performance    Comorbidities impacting occupational performance description:  see SUBJECTIVE    Modification or Assistance to Complete Evaluation   Min-Moderate modification of tasks or assist with assess necessary  to complete eval    OT Frequency  3x / week    OT Duration  12 weeks   and PRN to address bilateral involvement-one leg at a time to limit falls risk   OT Treatment/Interventions  Self-care/ADL training;Therapeutic exercise;Manual lymph drainage;Coping strategies training;Other (comment);Patient/family education;Compression bandaging;Therapeutic activities;Manual Therapy;DME and/or AE instruction    Plan  CDT: manua lymphatic drainage (MLD, skin care, lymphatic pumping therex, gradient compression wraps and compression garments / devices)    Consulted and Agree with Plan of Care  Patient       Patient will benefit from skilled therapeutic intervention in order to improve the following deficits and impairments:   Body Structure / Function / Physical Skills: ADL, Decreased knowledge of precautions, Flexibility, ROM, Balance, Decreased knowledge of use of DME, Mobility, Edema, Skin integrity, Obesity, Strength, IADL   Psychosocial Skills: Coping Strategies, Habits   Visit Diagnosis: Lymphedema, not elsewhere classified    Problem List Patient Active Problem List   Diagnosis Date Noted  . Thrombocytopenia ([place]) 01/23/2019  . Leukopenia 01/23/2019  . Lymphedema of both lower extremities 01/16/2019  . Polyarthralgia 10/14/2018  . Headache disorder 10/14/2018  . Chronic daily headache 10/11/2018  . COPD (chronic obstructive pulmonary disease) ([place]) 10/11/2018  . Thoracic aortic atherosclerosis ([place]) 10/11/2018  . Multiple episodes of deep venous thrombosis ([place]) 09/17/2018  . IBS (irritable bowel syndrome) 09/17/2018  . Healthcare maintenance 09/17/2018  . Primary osteoarthritis of right hip 09/03/2018  . Presence of right hip implant 09/03/2018  . Morbid obesity with BMI of 45.0-49.9, adult ([place]) 08/23/2018  . Shortness of breath 08/22/2018  . History of ductal carcinoma in situ (DCIS) of breast 07/20/2018  . Aromatase inhibitor use 07/20/2018  . Abnormal findings on auscultation  07/16/2018  . Encounter for long-term (current) use of high-risk medication 07/16/2018  . Fibromyalgia 07/16/2018  . Other forms of systemic lupus erythematosus ([place]) 07/16/2018  . Other osteoporosis without current pathological fracture 07/16/2018  . Seronegative  arthritis 07/16/2018    Andrey Spearman, MS, OTR/L, [facility] 04/01/19 1:07 PM  [place] MAIN [facility] SERVICES [address] [place], [place], [address] Phone: [phone]   Fax:  [phone]  Name: Victoria [name] MRN: [id number] Date of Birth: [birth year]

## 2019-04-02 ENCOUNTER — Other Ambulatory Visit: Payer: Self-pay

## 2019-04-02 ENCOUNTER — Ambulatory Visit: Payer: Medicare Other | Admitting: Occupational Therapy

## 2019-04-02 DIAGNOSIS — I890 Lymphedema, not elsewhere classified: Secondary | ICD-10-CM | POA: Diagnosis not present

## 2019-04-02 NOTE — Therapy (Signed)
[place] MAIN [facility] SERVICES [address] [place], [place], [address] Phone: [phone]   Fax:  [phone]  Occupational Therapy Treatment  Patient Details  Name: Victoria [name] MRN: [id number] Date of Birth: [birth year] Referring Provider (OT): Marlowe Sax, MD   Encounter Date: 04/02/2019  OT End of Session - 04/02/19 1624    Visit Number  14    Number of Visits  36    Date for OT Re-Evaluation  05/11/19    OT Start Time  0100    OT Stop Time  0200    OT Time Calculation (min)  60 min    Activity Tolerance  Patient tolerated treatment well;No increased pain    Behavior During Therapy  WFL for tasks assessed/performed       Past Medical History:  Diagnosis Date  . Asthma   . Cancer (HCC)    Breast Left, DCIS   . Collagen vascular disease ([place])   . COPD (chronic obstructive pulmonary disease) ([place])   . Diverticulitis   . Fibromyalgia   . GERD (gastroesophageal reflux disease)   . Hearing impaired person, left   . IBS (irritable bowel syndrome)   . Lupus ([place])   . Mitral valve disorder   . MS (multiple sclerosis) ([place])   . Neuromuscular disorder ([place])   . Osteoarthritis   . Personal history of chemotherapy   . Personal history of radiation therapy 2017   left   . Rheumatoid arthritis ([place])   . Seizures ([place])     Past Surgical History:  Procedure Laterality Date  . ABDOMINAL HYSTERECTOMY    . APPENDECTOMY    . BREAST EXCISIONAL BIOPSY Left 2017   lumpectomy with radation  . BREAST EXCISIONAL BIOPSY Left 2004   neg surgical bx   . BREAST SURGERY    . CHOLECYSTECTOMY    . PATENT DUCTUS ARTERIOUS REPAIR    . RECONSTRUCTION TENDON PULLEY HAND Left   . TOTAL HIP ARTHROPLASTY Right     There were no vitals filed for this visit.  Subjective Assessment - 04/02/19 1621    Subjective   Ms Gutter presents for OT visit 14//36 to address BLE lipo-lymphedema. Pt is early for her appointment this morning. Pt  reports generalized pain 6/10. Pt  presents with compression wraps in place, She reports that she doesn't feel well today, but denies LLE lymphedema related pain.    Pertinent History  Complex medical history includes several systemic, inflammatory conditions, including lupus, OA, RA, fibromyalgia. Complex medical history also includes additional contributing conditions including obesity,  hx recurrent DVT and single PE, hx LE cellulitis, asthma and COPD , Hx L breast Ca w SNLB, (Stage 0/ sub clinical LUE/LUQ lymphedema)  partial mastectomy and XRT (2017), MS, and R THA 2009.    Limitations  chronic B leg swelling and associated pain, difficulty walking, impaired transfers and functional mobility, decreased balance,, decreaased joint AROM 2/2 sweling at hips, knees ankles and toes ( difficulty bending to reach feet to perform LB bathing, skin and nail care,  and LB dressing) impaired activity tolerance  for activities requiring standing and/or walking > 20 minutes, (shopping, home management chores, cooking). Impaired body image. Impaired social participation    Repetition  Increases Symptoms    Special Tests  + Stemmer sign B feet    Pain Onset  Other (comment)   chronic  OT Education - 04/02/19 1623    Education Details  Cont Pt edu throughout session for LE self care home protocol. Discussed pump trial.    Person(s) Educated  Patient    Methods  Explanation;Demonstration;Handout    Comprehension  Verbalized understanding;Returned demonstration;Need further instruction          OT Long Term Goals - 03/21/19 1045      OT LONG TERM GOAL #1   Title  Pt will demonstrate modified independance with lymphedema (LE) precautions and prevention strategies by identifying 6  items from list provided in LE Workbook  by 4th OT Rx visit to limit LE progression.    Baseline  Max A MET 02/23/19    Time  4   4th OT rx visit   Period  Days    Status  Achieved       OT LONG TERM GOAL #2   Title  Pt will be able to apply  knee length, multi-layer, short stretch compression wraps using correct gradient techniques with mod CG assistance  to achieve optimal limb volume reduction, and to return affected limb , as closely as possible, to premorbid size and shape, to limit infection risk and to improve tissue integrity.    Baseline  dependent 02/23/19 GOAL MET    Time  4    Period  Days    Status  Achieved      OT LONG TERM GOAL #3   Title  Pt to achieve at least 10% limb volume reduction below knees bilaterally  during Intensive Phase CDT  to improve tissue elasticity needed for optimal lymphatic function and joint AROM, , to return limbs to premorbid size and shape, to improve functional transfers and mobility, and to decrease infection risk.    Baseline  Max A 03/21/19: partially achieved with 13.93% volume reduction below the knee on L. RLE CDT  will commence once LLE garments are fitted . They are ordered.    Time  12    Period  Weeks    Status  Partially Met      OT LONG TERM GOAL #4   Title  Pt will achieve  100% compliance with daily LE self-care program with mod  caregiver assistance throughout Intensive Phase CDT, including daily  skin care, lymphatic pumping therex, simple self-MLD and prescribe compression to ensure optimal limb volume reduction, to reduce infection risk and to limit LE progression for optimal functional performance in all occupational domains.    Baseline  Max A    Time  12    Period  Weeks    Status  Achieved      OT LONG TERM GOAL #5   Title  Pt will demonstrate modified independence using assistive devices and extra time to don and doff properly fitting, custom compression garments/devices for optimal LE self-management over time.    Baseline  Max A    Time  12    Period  Weeks    Status  On-going      OT LONG TERM GOAL #6   Title  Pt will retain clinical BLE limb volume reductions over 6 months following intensive  phase CDT ( management phase) with mod A from CG and by Pt consistently following all home program components daily to limit LE progression and further functional decline.    Baseline  Dependent    Time  6    Period  Months    Status  On-going  Plan - 04/02/19 1624    Clinical Impression Statement  LLE swelling well managed between visits. Pt demonstrates good compliance with compression wraps andskin care to LLE.Pt has difficulty reaching feet and distal legs to effectively perform simple self MLD due to chronic back pain. Cont as per POC. Commence RLE CDT once  custom compression is fitted.    OT Occupational Profile and History  Comprehensive Assessment- Review of records and extensive additional review of physical, cognitive, psychosocial history related to current functional performance    Occupational performance deficits (Please refer to evaluation for details):  ADL's;IADL's;Social Participation;Leisure;Work;Play    Body Structure / Function / Physical Skills  ADL;Decreased knowledge of precautions;Flexibility;ROM;Balance;Decreased knowledge of use of DME;Mobility;Edema;Skin integrity;Obesity;Strength;IADL    Psychosocial Skills  Coping Strategies;Habits    Rehab Potential  Good    Clinical Decision Making  Multiple treatment options, significant modification of task necessary    Comorbidities Affecting Occupational Performance:  Presence of comorbidities impacting occupational performance    Comorbidities impacting occupational performance description:  see SUBJECTIVE    Modification or Assistance to Complete Evaluation   Min-Moderate modification of tasks or assist with assess necessary to complete eval    OT Frequency  3x / week    OT Duration  12 weeks   and PRN to address bilateral involvement-one leg at a time to limit falls risk   OT Treatment/Interventions  Self-care/ADL training;Therapeutic exercise;Manual lymph drainage;Coping strategies training;Other  (comment);Patient/family education;Compression bandaging;Therapeutic activities;Manual Therapy;DME and/or AE instruction    Plan  CDT: manua lymphatic drainage (MLD, skin care, lymphatic pumping therex, gradient compression wraps and compression garments / devices)    Consulted and Agree with Plan of Care  Patient       Patient will benefit from skilled therapeutic intervention in order to improve the following deficits and impairments:   Body Structure / Function / Physical Skills: ADL, Decreased knowledge of precautions, Flexibility, ROM, Balance, Decreased knowledge of use of DME, Mobility, Edema, Skin integrity, Obesity, Strength, IADL   Psychosocial Skills: Coping Strategies, Habits   Visit Diagnosis: Lymphedema, not elsewhere classified    Problem List Patient Active Problem List   Diagnosis Date Noted  . Thrombocytopenia ([place]) 01/23/2019  . Leukopenia 01/23/2019  . Lymphedema of both lower extremities 01/16/2019  . Polyarthralgia 10/14/2018  . Headache disorder 10/14/2018  . Chronic daily headache 10/11/2018  . COPD (chronic obstructive pulmonary disease) ([place]) 10/11/2018  . Thoracic aortic atherosclerosis ([place]) 10/11/2018  . Multiple episodes of deep venous thrombosis ([place]) 09/17/2018  . IBS (irritable bowel syndrome) 09/17/2018  . Healthcare maintenance 09/17/2018  . Primary osteoarthritis of right hip 09/03/2018  . Presence of right hip implant 09/03/2018  . Morbid obesity with BMI of 45.0-49.9, adult ([place]) 08/23/2018  . Shortness of breath 08/22/2018  . History of ductal carcinoma in situ (DCIS) of breast 07/20/2018  . Aromatase inhibitor use 07/20/2018  . Abnormal findings on auscultation 07/16/2018  . Encounter for long-term (current) use of high-risk medication 07/16/2018  . Fibromyalgia 07/16/2018  . Other forms of systemic lupus erythematosus ([place]) 07/16/2018  . Other osteoporosis without current pathological fracture 07/16/2018  . Seronegative arthritis  07/16/2018    Andrey Spearman, MS, OTR/L, [facility] 04/02/19 4:34 PM   [place] MAIN [facility] SERVICES [address] [place], [place], [address] Phone: [phone]   Fax:  [phone]  Name: Victoria [name] MRN: [id number] Date of Birth: July 10, 1961

## 2019-04-03 ENCOUNTER — Ambulatory Visit
Admission: RE | Admit: 2019-04-03 | Discharge: 2019-04-03 | Disposition: A | Discharge status: Home / Self Care | Payer: Medicare Other | Source: Ambulatory Visit | Attending: Gastroenterology | Admitting: Gastroenterology

## 2019-04-03 ENCOUNTER — Other Ambulatory Visit: Payer: Self-pay | Admitting: Gastroenterology

## 2019-04-03 ENCOUNTER — Other Ambulatory Visit: Payer: Self-pay

## 2019-04-03 DIAGNOSIS — R1032 Left lower quadrant pain: Secondary | ICD-10-CM | POA: Diagnosis present

## 2019-04-03 LAB — POCT I-STAT CREATININE: Creatinine, Ser: 0.6 mg/dL (ref 0.44–1.00)

## 2019-04-03 MED ORDER — HEPARIN SOD (PORK) LOCK FLUSH 100 UNIT/ML IV SOLN
500.0000 [IU] | INTRAVENOUS | Status: AC | PRN
  Administered 2019-04-03: 500 [IU]

## 2019-04-03 MED ORDER — IOHEXOL 300 MG/ML  SOLN
100.0000 mL | Freq: Once | INTRAMUSCULAR | Status: AC | PRN
  Administered 2019-04-03: 100 mL via INTRAVENOUS

## 2019-04-04 ENCOUNTER — Other Ambulatory Visit
Admission: RE | Admit: 2019-04-04 | Discharge: 2019-04-04 | Disposition: A | Discharge status: Home / Self Care | Payer: Medicare Other | Source: Ambulatory Visit | Attending: Gastroenterology | Admitting: Gastroenterology

## 2019-04-04 ENCOUNTER — Ambulatory Visit: Payer: Medicare Other | Admitting: Occupational Therapy

## 2019-04-04 ENCOUNTER — Encounter: Payer: Medicare Other | Admitting: Occupational Therapy

## 2019-04-04 DIAGNOSIS — R1032 Left lower quadrant pain: Secondary | ICD-10-CM | POA: Diagnosis not present

## 2019-04-04 DIAGNOSIS — R197 Diarrhea, unspecified: Secondary | ICD-10-CM | POA: Diagnosis present

## 2019-04-04 DIAGNOSIS — R1031 Right lower quadrant pain: Secondary | ICD-10-CM | POA: Diagnosis not present

## 2019-04-04 DIAGNOSIS — K5790 Diverticulosis of intestine, part unspecified, without perforation or abscess without bleeding: Secondary | ICD-10-CM | POA: Diagnosis not present

## 2019-04-04 LAB — C DIFFICILE QUICK SCREEN W PCR REFLEX
C Diff antigen: NEGATIVE
C Diff interpretation: NOT DETECTED
C Diff toxin: NEGATIVE

## 2019-04-05 LAB — GASTROINTESTINAL PANEL BY PCR, STOOL (REPLACES STOOL CULTURE)

## 2019-04-06 DIAGNOSIS — A498 Other bacterial infections of unspecified site: Secondary | ICD-10-CM | POA: Insufficient documentation

## 2019-04-06 DIAGNOSIS — K5792 Diverticulitis of intestine, part unspecified, without perforation or abscess without bleeding: Secondary | ICD-10-CM | POA: Insufficient documentation

## 2019-04-08 ENCOUNTER — Ambulatory Visit: Payer: Medicare Other | Admitting: Occupational Therapy

## 2019-04-09 ENCOUNTER — Ambulatory Visit: Payer: Medicare Other | Attending: Internal Medicine | Admitting: Occupational Therapy

## 2019-04-09 DIAGNOSIS — I890 Lymphedema, not elsewhere classified: Secondary | ICD-10-CM | POA: Insufficient documentation

## 2019-04-10 ENCOUNTER — Ambulatory Visit: Payer: Medicare Other | Admitting: Occupational Therapy

## 2019-04-11 ENCOUNTER — Encounter: Payer: Medicare Other | Admitting: Occupational Therapy

## 2019-04-15 ENCOUNTER — Ambulatory Visit: Payer: Medicare Other | Admitting: Occupational Therapy

## 2019-04-16 ENCOUNTER — Ambulatory Visit: Payer: Medicare Other | Admitting: Occupational Therapy

## 2019-04-17 ENCOUNTER — Ambulatory Visit: Payer: Medicare Other | Admitting: Occupational Therapy

## 2019-04-17 ENCOUNTER — Encounter: Payer: Medicare Other | Admitting: Occupational Therapy

## 2019-04-18 ENCOUNTER — Ambulatory Visit: Payer: Medicare Other | Admitting: Occupational Therapy

## 2019-04-22 ENCOUNTER — Ambulatory Visit: Payer: Medicare Other | Admitting: Occupational Therapy

## 2019-04-23 ENCOUNTER — Inpatient Hospital Stay: Payer: Medicare Other | Admitting: Oncology

## 2019-04-23 ENCOUNTER — Inpatient Hospital Stay: Payer: Medicare Other

## 2019-04-23 ENCOUNTER — Ambulatory Visit: Payer: Medicare Other | Admitting: Occupational Therapy

## 2019-04-23 ENCOUNTER — Other Ambulatory Visit: Payer: Self-pay

## 2019-04-23 ENCOUNTER — Encounter: Payer: Self-pay | Admitting: Oncology

## 2019-04-23 ENCOUNTER — Other Ambulatory Visit
Admission: RE | Admit: 2019-04-23 | Discharge: 2019-04-23 | Disposition: A | Discharge status: Home / Self Care | Payer: Medicare Other | Source: Ambulatory Visit | Attending: Gastroenterology | Admitting: Gastroenterology

## 2019-04-23 DIAGNOSIS — A040 Enteropathogenic Escherichia coli infection: Secondary | ICD-10-CM | POA: Diagnosis present

## 2019-04-23 DIAGNOSIS — I890 Lymphedema, not elsewhere classified: Secondary | ICD-10-CM

## 2019-04-23 DIAGNOSIS — R197 Diarrhea, unspecified: Secondary | ICD-10-CM | POA: Insufficient documentation

## 2019-04-23 LAB — GASTROINTESTINAL PANEL BY PCR, STOOL (REPLACES STOOL CULTURE)

## 2019-04-23 NOTE — Therapy (Signed)
[place] MAIN [facility] SERVICES [address] [place], [place], [address] Phone: [phone]   Fax:  [phone]  Occupational Therapy  Re-Evaluation and Treatment Note  Patient Details  Name: Victoria [name] MRN: [id number] Date of Birth: [birth year] Referring Provider (OT): Marlowe Sax, MD   Encounter Date: 04/23/2019  OT End of Session - 04/23/19 1047    Visit Number  15    Number of Visits  36    Date for OT Re-Evaluation  05/11/19    OT Start Time  0900    OT Stop Time  1010    OT Time Calculation (min)  70 min    Activity Tolerance  Patient tolerated treatment well;No increased pain    Behavior During Therapy  WFL for tasks assessed/performed       Past Medical History:  Diagnosis Date  . Asthma   . Cancer (HCC)    Breast Left, DCIS   . Collagen vascular disease ([place])   . COPD (chronic obstructive pulmonary disease) ([place])   . Diverticulitis   . Fibromyalgia   . GERD (gastroesophageal reflux disease)   . Hearing impaired person, left   . IBS (irritable bowel syndrome)   . Lupus ([place])   . Mitral valve disorder   . MS (multiple sclerosis) ([place])   . Neuromuscular disorder ([place])   . Osteoarthritis   . Personal history of chemotherapy   . Personal history of radiation therapy 2017   left   . Rheumatoid arthritis ([place])   . Seizures ([place])     Past Surgical History:  Procedure Laterality Date  . ABDOMINAL HYSTERECTOMY    . APPENDECTOMY    . BREAST EXCISIONAL BIOPSY Left 2017   lumpectomy with radation  . BREAST EXCISIONAL BIOPSY Left 2004   neg surgical bx   . BREAST SURGERY    . CHOLECYSTECTOMY    . PATENT DUCTUS ARTERIOUS REPAIR    . RECONSTRUCTION TENDON PULLEY HAND Left   . TOTAL HIP ARTHROPLASTY Right     There were no vitals filed for this visit.  Subjective Assessment - 04/23/19 1034    Subjective   Ms Victoria [name] presents for OT visit 15//36 to address BLE lipo-lymphedema. Pt was last seen for lymphedema  treatment on 8.26.20. Treatment was interrupted by episodes of ecoli infection and diverticulitis resulting in week long hospitalization. Pt reports not yet feeling well. She reports suspected CDiff was negative. "I wasn't really wrapping my legs a lot." At Pt's request OT emailed requests for status updates  from DME vendors re custom cmopression garments  and sequential pneumaic compression device.    Pertinent History  Complex medical history includes several systemic, inflammatory conditions, including lupus, OA, RA, fibromyalgia. Complex medical history also includes additional contributing conditions including obesity,  hx recurrent DVT and single PE, hx LE cellulitis, asthma and COPD , Hx L breast Ca w SNLB, (Stage 0/ sub clinical LUE/LUQ lymphedema)  partial mastectomy and XRT (2017), MS, and R THA 2009.    Limitations  chronic B leg swelling and associated pain, difficulty walking, impaired transfers and functional mobility, decreased balance,, decreaased joint AROM 2/2 sweling at hips, knees ankles and toes ( difficulty bending to reach feet to perform LB bathing, skin and nail care,  and LB dressing) impaired activity tolerance  for activities requiring standing and/or walking > 20 minutes, (shopping, home management chores, cooking). Impaired body image. Impaired social participation    Repetition  Increases Symptoms    Special Tests  +  Stemmer sign B feet    Pain Onset  Other (comment)   chronic                  OT Treatments/Exercises (OP) - 04/23/19 0001      ADLs   ADL Education Given  Yes      Manual Therapy   Manual Therapy  Edema management;Manual Lymphatic Drainage (MLD);Compression Bandaging    Manual Lymphatic Drainage (MLD)  LLE MLD as establ;ished    Compression Bandaging  LLE multilayer,  gradient  compression wraps from ankle to below knee as established             OT Education - 04/23/19 1046    Education Details  Continued skilled Pt/caregiver  education  And LE ADL training throughout visit for lymphedema self care/ home program, including compression wrapping, compression garment and device wear/care, lymphatic pumping ther ex, simple self-MLD, and skin care. Discussed progress towards goals.    Person(s) Educated  Patient    Methods  Explanation;Demonstration;Handout    Comprehension  Verbalized understanding;Returned demonstration;Need further instruction          OT Long Term Goals - 04/23/19 1052      OT LONG TERM GOAL #1   Title  Pt will demonstrate modified independance with lymphedema (LE) precautions and prevention strategies by identifying 6  items from list provided in LE Workbook  by 4th OT Rx visit to limit LE progression.    Baseline  Max A MET 02/23/19    Time  4   4th OT rx visit   Period  Days    Status  Achieved      OT LONG TERM GOAL #2   Title  Pt will be able to apply  knee length, multi-layer, short stretch compression wraps using correct gradient techniques with mod CG assistance  to achieve optimal limb volume reduction, and to return affected limb , as closely as possible, to premorbid size and shape, to limit infection risk and to improve tissue integrity.    Baseline  dependent 02/23/19 GOAL MET    Time  4    Period  Days    Status  Achieved      OT LONG TERM GOAL #3   Title  Pt to achieve at least 10% limb volume reduction below knees bilaterally  during Intensive Phase CDT  to improve tissue elasticity needed for optimal lymphatic function and joint AROM, , to return limbs to premorbid size and shape, to improve functional transfers and mobility, and to decrease infection risk.    Baseline  Max A 03/21/19: partially achieved with 13.93% volume reduction below the knee on L. RLE CDT  will commence once LLE garments are fitted . They are ordered.    Time  12    Period  Weeks    Status  Partially Met      OT LONG TERM GOAL #4   Title  Pt will achieve  100% compliance with daily LE self-care program  with mod  caregiver assistance throughout Intensive Phase CDT, including daily  skin care, lymphatic pumping therex, simple self-MLD and prescribe compression to ensure optimal limb volume reduction, to reduce infection risk and to limit LE progression for optimal functional performance in all occupational domains.    Baseline  Max A    Time  12    Period  Weeks    Status  Achieved      OT LONG TERM GOAL #5   Title  Pt will demonstrate modified independence using assistive devices and extra time to don and doff properly fitting, custom compression garments/devices for optimal LE self-management over time.    Baseline  Max A    Time  12    Period  Weeks    Status  On-going      OT LONG TERM GOAL #6   Title  Pt will retain clinical BLE limb volume reductions over 6 months following intensive phase CDT ( management phase) with mod A from CG and by Pt consistently following all home program components daily to limit LE progression and further functional decline.    Baseline  Dependent    Time  6    Period  Months    Status  On-going            Plan - 04/23/19 1047    Clinical Impression Statement  Pt returned to OT for LE care after 2 1/2 week absence due to hospitalization for gastrointestinal issues.Pt reports she was unable to perform LE self care during most of that time due to illness. LLE is only mildly more swollen today. I suspect leg elevation during most of time off  has contributed significantly to good swelling control. Pt tolerated MLD and comrpession wraps to LLE today without increased pain or SOB. Cont 2 x weekly OT for LE care as per POC. Firt with compression garments ASAP. Follow along with equipment rep re pump status.    OT Occupational Profile and History  Comprehensive Assessment- Review of records and extensive additional review of physical, cognitive, psychosocial history related to current functional performance    Occupational performance deficits (Please refer to  evaluation for details):  ADL's;IADL's;Social Participation;Leisure;Work;Play    Body Structure / Function / Physical Skills  ADL;Decreased knowledge of precautions;Flexibility;ROM;Balance;Decreased knowledge of use of DME;Mobility;Edema;Skin integrity;Obesity;Strength;IADL    Psychosocial Skills  Coping Strategies;Habits    Rehab Potential  Good    Clinical Decision Making  Multiple treatment options, significant modification of task necessary    Comorbidities Affecting Occupational Performance:  Presence of comorbidities impacting occupational performance    Comorbidities impacting occupational performance description:  see SUBJECTIVE    Modification or Assistance to Complete Evaluation   Min-Moderate modification of tasks or assist with assess necessary to complete eval    OT Frequency  3x / week    OT Duration  12 weeks   and PRN to address bilateral involvement-one leg at a time to limit falls risk   OT Treatment/Interventions  Self-care/ADL training;Therapeutic exercise;Manual lymph drainage;Coping strategies training;Other (comment);Patient/family education;Compression bandaging;Therapeutic activities;Manual Therapy;DME and/or AE instruction    Plan  CDT: manua lymphatic drainage (MLD, skin care, lymphatic pumping therex, gradient compression wraps and compression garments / devices)    Consulted and Agree with Plan of Care  Patient       Patient will benefit from skilled therapeutic intervention in order to improve the following deficits and impairments:   Body Structure / Function / Physical Skills: ADL, Decreased knowledge of precautions, Flexibility, ROM, Balance, Decreased knowledge of use of DME, Mobility, Edema, Skin integrity, Obesity, Strength, IADL   Psychosocial Skills: Coping Strategies, Habits   Visit Diagnosis: Lymphedema, not elsewhere classified    Problem List Patient Active Problem List   Diagnosis Date Noted  . Thrombocytopenia ([place]) 01/23/2019  . Leukopenia  01/23/2019  . Lymphedema of both lower extremities 01/16/2019  . Polyarthralgia 10/14/2018  . Headache disorder 10/14/2018  . Chronic daily headache 10/11/2018  . COPD (chronic obstructive pulmonary  disease) ([place]) 10/11/2018  . Thoracic aortic atherosclerosis ([place]) 10/11/2018  . Multiple episodes of deep venous thrombosis ([place]) 09/17/2018  . IBS (irritable bowel syndrome) 09/17/2018  . Healthcare maintenance 09/17/2018  . Primary osteoarthritis of right hip 09/03/2018  . Presence of right hip implant 09/03/2018  . Morbid obesity with BMI of 45.0-49.9, adult ([place]) 08/23/2018  . Shortness of breath 08/22/2018  . History of ductal carcinoma in situ (DCIS) of breast 07/20/2018  . Aromatase inhibitor use 07/20/2018  . Abnormal findings on auscultation 07/16/2018  . Encounter for long-term (current) use of high-risk medication 07/16/2018  . Fibromyalgia 07/16/2018  . Other forms of systemic lupus erythematosus ([place]) 07/16/2018  . Other osteoporosis without current pathological fracture 07/16/2018  . Seronegative arthritis 07/16/2018    Andrey Spearman, MS, OTR/L, [facility] 04/23/19 10:55 AM  [place] MAIN [facility] SERVICES [address] [place], [place], [address] Phone: [phone]   Fax:  [phone]  Name: Marisah Laker MRN: [id number] Date of Birth: [birth year]

## 2019-04-23 NOTE — Patient Instructions (Signed)

## 2019-04-24 ENCOUNTER — Encounter: Payer: Medicare Other | Admitting: Occupational Therapy

## 2019-04-24 ENCOUNTER — Ambulatory Visit: Payer: Medicare Other | Admitting: Occupational Therapy

## 2019-04-24 ENCOUNTER — Other Ambulatory Visit: Payer: Self-pay

## 2019-04-24 DIAGNOSIS — I890 Lymphedema, not elsewhere classified: Secondary | ICD-10-CM | POA: Diagnosis not present

## 2019-04-24 NOTE — Therapy (Signed)
[place] MAIN [facility] SERVICES [address] [place], [place], [address] Phone: [phone]   Fax:  [phone]  Occupational Therapy Treatment  Patient Details  Name: Victoria [name] MRN: [id number] Date of Birth: 1961-01-30 Referring Provider (OT): Marlowe Sax, MD   Encounter Date: 04/24/2019  OT End of Session - 04/24/19 1238    Visit Number  16    Number of Visits  36    Date for OT Re-Evaluation  05/11/19    OT Start Time  0912    OT Stop Time  1005    OT Time Calculation (min)  53 min    Activity Tolerance  Patient tolerated treatment well;No increased pain    Behavior During Therapy  WFL for tasks assessed/performed       Past Medical History:  Diagnosis Date  . Asthma   . Cancer (HCC)    Breast Left, DCIS   . Collagen vascular disease ([place])   . COPD (chronic obstructive pulmonary disease) ([place])   . Diverticulitis   . Fibromyalgia   . GERD (gastroesophageal reflux disease)   . Hearing impaired person, left   . IBS (irritable bowel syndrome)   . Lupus ([place])   . Mitral valve disorder   . [place] (multiple sclerosis) ([place])   . Neuromuscular disorder ([place])   . Osteoarthritis   . Personal history of chemotherapy   . Personal history of radiation therapy 2017   left   . Rheumatoid arthritis ([place])   . Seizures ([place])     Past Surgical History:  Procedure Laterality Date  . ABDOMINAL HYSTERECTOMY    . APPENDECTOMY    . BREAST EXCISIONAL BIOPSY Left 2017   lumpectomy with radation  . BREAST EXCISIONAL BIOPSY Left 2004   neg surgical bx   . BREAST SURGERY    . CHOLECYSTECTOMY    . PATENT DUCTUS ARTERIOUS REPAIR    . RECONSTRUCTION TENDON PULLEY HAND Left   . TOTAL HIP ARTHROPLASTY Right     There were no vitals filed for this visit.  Subjective Assessment - 04/24/19 1232    Subjective   Victoria [name] presents for OT visit 16//36 to address BLE lipo-lymphedema. Pt presents with compression wraps in place. Pt reports  she had no difficulty tolerating compression wraps over night. Pt updated on DME reps reccommendation to complete trial with basic pump before she will potentially qualify for an advanced pump.    Pertinent History  Complex medical history includes several systemic, inflammatory conditions, including lupus, OA, RA, fibromyalgia. Complex medical history also includes additional contributing conditions including obesity,  hx recurrent DVT and single PE, hx LE cellulitis, asthma and COPD , Hx L breast Ca w SNLB, (Stage 0/ sub clinical LUE/LUQ lymphedema)  partial mastectomy and XRT (2017), [place], and R THA 2009.    Limitations  chronic B leg swelling and associated pain, difficulty walking, impaired transfers and functional mobility, decreased balance,, decreaased joint AROM 2/2 sweling at hips, knees ankles and toes ( difficulty bending to reach feet to perform LB bathing, skin and nail care,  and LB dressing) impaired activity tolerance  for activities requiring standing and/or walking > 20 minutes, (shopping, home management chores, cooking). Impaired body image. Impaired social participation    Repetition  Increases Symptoms    Special Tests  + Stemmer sign B feet    Currently in Pain?  Yes    Pain Onset  Other (comment)   chronic  OT Treatments/Exercises (OP) - 04/24/19 0001      ADLs   ADL Education Given  Yes      Manual Therapy   Manual Therapy  Edema management    Manual Lymphatic Drainage (MLD)  LLE MLD as establ;ished    Compression Bandaging  LLE multilayer,  gradient  compression wraps from ankle to below knee as established             OT Education - 04/24/19 1237    Education Details  Continued skilled Pt/caregiver education  And LE ADL training throughout visit for lymphedema self care/ home program, including compression wrapping, compression garment and device wear/care, lymphatic pumping ther ex, simple self-MLD, and skin care. Discussed progress  towards goals.    Person(s) Educated  Patient    Methods  Explanation;Demonstration;Handout    Comprehension  Verbalized understanding;Returned demonstration;Need further instruction          OT Long Term Goals - 04/23/19 1052      OT LONG TERM GOAL #1   Title  Pt will demonstrate modified independance with lymphedema (LE) precautions and prevention strategies by identifying 6  items from list provided in LE Workbook  by 4th OT Rx visit to limit LE progression.    Baseline  Max A MET 02/23/19    Time  4   4th OT rx visit   Period  Days    Status  Achieved      OT LONG TERM GOAL #2   Title  Pt will be able to apply  knee length, multi-layer, short stretch compression wraps using correct gradient techniques with mod CG assistance  to achieve optimal limb volume reduction, and to return affected limb , as closely as possible, to premorbid size and shape, to limit infection risk and to improve tissue integrity.    Baseline  dependent 02/23/19 GOAL MET    Time  4    Period  Days    Status  Achieved      OT LONG TERM GOAL #3   Title  Pt to achieve at least 10% limb volume reduction below knees bilaterally  during Intensive Phase CDT  to improve tissue elasticity needed for optimal lymphatic function and joint AROM, , to return limbs to premorbid size and shape, to improve functional transfers and mobility, and to decrease infection risk.    Baseline  Max A 03/21/19: partially achieved with 13.93% volume reduction below the knee on L. RLE CDT  will commence once LLE garments are fitted . They are ordered.    Time  12    Period  Weeks    Status  Partially Met      OT LONG TERM GOAL #4   Title  Pt will achieve  100% compliance with daily LE self-care program with mod  caregiver assistance throughout Intensive Phase CDT, including daily  skin care, lymphatic pumping therex, simple self-MLD and prescribe compression to ensure optimal limb volume reduction, to reduce infection risk and to limit  LE progression for optimal functional performance in all occupational domains.    Baseline  Max A    Time  12    Period  Weeks    Status  Achieved      OT LONG TERM GOAL #5   Title  Pt will demonstrate modified independence using assistive devices and extra time to don and doff properly fitting, custom compression garments/devices for optimal LE self-management over time.    Baseline  Max A  Time  12    Period  Weeks    Status  On-going      OT LONG TERM GOAL #6   Title  Pt will retain clinical BLE limb volume reductions over 6 months following intensive phase CDT ( management phase) with mod A from CG and by Pt consistently following all home program components daily to limit LE progression and further functional decline.    Baseline  Dependent    Time  6    Period  Months    Status  On-going            Plan - 04/24/19 1239    Clinical Impression Statement  Pt tolerated compression wrap to LLE overnight without difficulty. Linb swelling is reduced from last visit and "shelf" at ankle as resolved again. MLD and reapplication of compression wraps well tolerated. Awaiting fitting for custom LLE compression garments- on order. Cont as per POC.    OT Occupational Profile and History  Comprehensive Assessment- Review of records and extensive additional review of physical, cognitive, psychosocial history related to current functional performance    Occupational performance deficits (Please refer to evaluation for details):  ADL's;IADL's;Social Participation;Leisure;Work;Play    Body Structure / Function / Physical Skills  ADL;Decreased knowledge of precautions;Flexibility;ROM;Balance;Decreased knowledge of use of DME;Mobility;Edema;Skin integrity;Obesity;Strength;IADL    Psychosocial Skills  Coping Strategies;Habits    Rehab Potential  Good    Clinical Decision Making  Multiple treatment options, significant modification of task necessary    Comorbidities Affecting Occupational  Performance:  Presence of comorbidities impacting occupational performance    Comorbidities impacting occupational performance description:  see SUBJECTIVE    Modification or Assistance to Complete Evaluation   Min-Moderate modification of tasks or assist with assess necessary to complete eval    OT Frequency  3x / week    OT Duration  12 weeks   and PRN to address bilateral involvement-one leg at a time to limit falls risk   OT Treatment/Interventions  Self-care/ADL training;Therapeutic exercise;Manual lymph drainage;Coping strategies training;Other (comment);Patient/family education;Compression bandaging;Therapeutic activities;Manual Therapy;DME and/or AE instruction    Plan  CDT: manua lymphatic drainage (MLD, skin care, lymphatic pumping therex, gradient compression wraps and compression garments / devices)    Consulted and Agree with Plan of Care  Patient       Patient will benefit from skilled therapeutic intervention in order to improve the following deficits and impairments:   Body Structure / Function / Physical Skills: ADL, Decreased knowledge of precautions, Flexibility, ROM, Balance, Decreased knowledge of use of DME, Mobility, Edema, Skin integrity, Obesity, Strength, IADL   Psychosocial Skills: Coping Strategies, Habits   Visit Diagnosis: Lymphedema, not elsewhere classified    Problem List Patient Active Problem List   Diagnosis Date Noted  . Thrombocytopenia ([place]) 01/23/2019  . Leukopenia 01/23/2019  . Lymphedema of both lower extremities 01/16/2019  . Polyarthralgia 10/14/2018  . Headache disorder 10/14/2018  . Chronic daily headache 10/11/2018  . COPD (chronic obstructive pulmonary disease) ([place]) 10/11/2018  . Thoracic aortic atherosclerosis ([place]) 10/11/2018  . Multiple episodes of deep venous thrombosis ([place]) 09/17/2018  . IBS (irritable bowel syndrome) 09/17/2018  . Healthcare maintenance 09/17/2018  . Primary osteoarthritis of right hip 09/03/2018  .  Presence of right hip implant 09/03/2018  . Morbid obesity with BMI of 45.0-49.9, adult ([place]) 08/23/2018  . Shortness of breath 08/22/2018  . History of ductal carcinoma in situ (DCIS) of breast 07/20/2018  . Aromatase inhibitor use 07/20/2018  . Abnormal findings on auscultation  07/16/2018  . Encounter for long-term (current) use of high-risk medication 07/16/2018  . Fibromyalgia 07/16/2018  . Other forms of systemic lupus erythematosus ([place]) 07/16/2018  . Other osteoporosis without current pathological fracture 07/16/2018  . Seronegative arthritis 07/16/2018    Andrey Spearman, [place], OTR/L, [facility] 04/24/19 3:27 PM  [place] MAIN [facility] SERVICES [address] [place], [place], [address] Phone: [phone]   Fax:  [phone]  Name: Victoria [name] MRN: [id number] Date of Birth: 04/02/1961

## 2019-04-25 ENCOUNTER — Ambulatory Visit: Payer: Medicare Other | Admitting: Occupational Therapy

## 2019-04-29 ENCOUNTER — Other Ambulatory Visit: Payer: Self-pay

## 2019-04-29 ENCOUNTER — Ambulatory Visit: Payer: Medicare Other | Admitting: Occupational Therapy

## 2019-04-29 DIAGNOSIS — I890 Lymphedema, not elsewhere classified: Secondary | ICD-10-CM | POA: Diagnosis not present

## 2019-04-29 NOTE — Therapy (Signed)
[place] MAIN [facility] SERVICES [address] [place], [place], [address] Phone: [phone]   Fax:  [phone]  Occupational Therapy Treatment  Patient Details  Name: Victoria [name] MRN: [id number] Date of Birth: [birth year] Referring Provider (OT): Marlowe Sax, MD   Encounter Date: 04/29/2019  OT End of Session - 04/29/19 1740    Visit Number  17    Number of Visits  36    Date for OT Re-Evaluation  05/11/19    OT Start Time  0900    OT Stop Time  1000    OT Time Calculation (min)  60 min    Activity Tolerance  Patient tolerated treatment well;No increased pain    Behavior During Therapy  WFL for tasks assessed/performed       Past Medical History:  Diagnosis Date  . Asthma   . Cancer (HCC)    Breast Left, DCIS   . Collagen vascular disease ([place])   . COPD (chronic obstructive pulmonary disease) ([place])   . Diverticulitis   . Fibromyalgia   . GERD (gastroesophageal reflux disease)   . Hearing impaired person, left   . IBS (irritable bowel syndrome)   . Lupus ([place])   . Mitral valve disorder   . Victoria (multiple sclerosis) ([place])   . Neuromuscular disorder ([place])   . Osteoarthritis   . Personal history of chemotherapy   . Personal history of radiation therapy 2017   left   . Rheumatoid arthritis ([place])   . Seizures ([place])     Past Surgical History:  Procedure Laterality Date  . ABDOMINAL HYSTERECTOMY    . APPENDECTOMY    . BREAST EXCISIONAL BIOPSY Left 2017   lumpectomy with radation  . BREAST EXCISIONAL BIOPSY Left 2004   neg surgical bx   . BREAST SURGERY    . CHOLECYSTECTOMY    . PATENT DUCTUS ARTERIOUS REPAIR    . RECONSTRUCTION TENDON PULLEY HAND Left   . TOTAL HIP ARTHROPLASTY Right     There were no vitals filed for this visit.  Subjective Assessment - 04/29/19 0920    Subjective   Victoria [name] presents for OT visit 17//36 to address BLE lipo-lymphedema. Pt presents without compression wraps in place. Pt c/o  of L hip pain which keeps her awake and limits ability to walk her dog.    Pertinent History  Complex medical history includes several systemic, inflammatory conditions, including lupus, OA, RA, fibromyalgia. Complex medical history also includes additional contributing conditions including obesity,  hx recurrent DVT and single PE, hx LE cellulitis, asthma and COPD , Hx L breast Ca w SNLB, (Stage 0/ sub clinical LUE/LUQ lymphedema)  partial mastectomy and XRT (2017), Victoria, and R THA 2009.    Limitations  chronic B leg swelling and associated pain, difficulty walking, impaired transfers and functional mobility, decreased balance,, decreaased joint AROM 2/2 sweling at hips, knees ankles and toes ( difficulty bending to reach feet to perform LB bathing, skin and nail care,  and LB dressing) impaired activity tolerance  for activities requiring standing and/or walking > 20 minutes, (shopping, home management chores, cooking). Impaired body image. Impaired social participation    Repetition  Increases Symptoms    Special Tests  + Stemmer sign B feet    Pain Onset  Other (comment)   chronic                  OT Treatments/Exercises (OP) - 04/29/19 0001      ADLs  ADL Education Given  Yes      Manual Therapy   Manual Therapy  Edema management    Manual Lymphatic Drainage (MLD)  LLE MLD as establ;ished    Compression Bandaging  LLE multilayer,  gradient  compression wraps from ankle to below knee as established             OT Education - 04/29/19 1740    Education Details  Continued skilled Pt/caregiver education  And LE ADL training throughout visit for lymphedema self care/ home program, including compression wrapping, compression garment and device wear/care, lymphatic pumping ther ex, simple self-MLD, and skin care. Discussed progress towards goals.    Person(s) Educated  Patient    Methods  Explanation;Demonstration;Handout    Comprehension  Verbalized understanding;Returned  demonstration;Need further instruction          OT Long Term Goals - 04/23/19 1052      OT LONG TERM GOAL #1   Title  Pt will demonstrate modified independance with lymphedema (LE) precautions and prevention strategies by identifying 6  items from list provided in LE Workbook  by 4th OT Rx visit to limit LE progression.    Baseline  Max A MET 02/23/19    Time  4   4th OT rx visit   Period  Days    Status  Achieved      OT LONG TERM GOAL #2   Title  Pt will be able to apply  knee length, multi-layer, short stretch compression wraps using correct gradient techniques with mod CG assistance  to achieve optimal limb volume reduction, and to return affected limb , as closely as possible, to premorbid size and shape, to limit infection risk and to improve tissue integrity.    Baseline  dependent 02/23/19 GOAL MET    Time  4    Period  Days    Status  Achieved      OT LONG TERM GOAL #3   Title  Pt to achieve at least 10% limb volume reduction below knees bilaterally  during Intensive Phase CDT  to improve tissue elasticity needed for optimal lymphatic function and joint AROM, , to return limbs to premorbid size and shape, to improve functional transfers and mobility, and to decrease infection risk.    Baseline  Max A 03/21/19: partially achieved with 13.93% volume reduction below the knee on L. RLE CDT  will commence once LLE garments are fitted . They are ordered.    Time  12    Period  Weeks    Status  Partially Met      OT LONG TERM GOAL #4   Title  Pt will achieve  100% compliance with daily LE self-care program with mod  caregiver assistance throughout Intensive Phase CDT, including daily  skin care, lymphatic pumping therex, simple self-MLD and prescribe compression to ensure optimal limb volume reduction, to reduce infection risk and to limit LE progression for optimal functional performance in all occupational domains.    Baseline  Max A    Time  12    Period  Weeks    Status   Achieved      OT LONG TERM GOAL #5   Title  Pt will demonstrate modified independence using assistive devices and extra time to don and doff properly fitting, custom compression garments/devices for optimal LE self-management over time.    Baseline  Max A    Time  12    Period  Weeks    Status  On-going      OT LONG TERM GOAL #6   Title  Pt will retain clinical BLE limb volume reductions over 6 months following intensive phase CDT ( management phase) with mod A from CG and by Pt consistently following all home program components daily to limit LE progression and further functional decline.    Baseline  Dependent    Time  6    Period  Months    Status  On-going            Plan - 04/29/19 1741    Clinical Impression Statement  Pt tolerated manual therapy today including MLD and LLE gradient compression wrapping. Pt required reminders abbout compression techniques. Cont as per POC. Compression garments should be delivered any day.    OT Occupational Profile and History  Comprehensive Assessment- Review of records and extensive additional review of physical, cognitive, psychosocial history related to current functional performance    Occupational performance deficits (Please refer to evaluation for details):  ADL's;IADL's;Social Participation;Leisure;Work;Play    Body Structure / Function / Physical Skills  ADL;Decreased knowledge of precautions;Flexibility;ROM;Balance;Decreased knowledge of use of DME;Mobility;Edema;Skin integrity;Obesity;Strength;IADL    Psychosocial Skills  Coping Strategies;Habits    Rehab Potential  Good    Clinical Decision Making  Multiple treatment options, significant modification of task necessary    Comorbidities Affecting Occupational Performance:  Presence of comorbidities impacting occupational performance    Comorbidities impacting occupational performance description:  see SUBJECTIVE    Modification or Assistance to Complete Evaluation   Min-Moderate  modification of tasks or assist with assess necessary to complete eval    OT Frequency  3x / week    OT Duration  12 weeks   and PRN to address bilateral involvement-one leg at a time to limit falls risk   OT Treatment/Interventions  Self-care/ADL training;Therapeutic exercise;Manual lymph drainage;Coping strategies training;Other (comment);Patient/family education;Compression bandaging;Therapeutic activities;Manual Therapy;DME and/or AE instruction    Plan  CDT: manua lymphatic drainage (MLD, skin care, lymphatic pumping therex, gradient compression wraps and compression garments / devices)    Consulted and Agree with Plan of Care  Patient       Patient will benefit from skilled therapeutic intervention in order to improve the following deficits and impairments:   Body Structure / Function / Physical Skills: ADL, Decreased knowledge of precautions, Flexibility, ROM, Balance, Decreased knowledge of use of DME, Mobility, Edema, Skin integrity, Obesity, Strength, IADL   Psychosocial Skills: Coping Strategies, Habits   Visit Diagnosis: Lymphedema, not elsewhere classified    Problem List Patient Active Problem List   Diagnosis Date Noted  . Thrombocytopenia ([place]) 01/23/2019  . Leukopenia 01/23/2019  . Lymphedema of both lower extremities 01/16/2019  . Polyarthralgia 10/14/2018  . Headache disorder 10/14/2018  . Chronic daily headache 10/11/2018  . COPD (chronic obstructive pulmonary disease) ([place]) 10/11/2018  . Thoracic aortic atherosclerosis ([place]) 10/11/2018  . Multiple episodes of deep venous thrombosis ([place]) 09/17/2018  . IBS (irritable bowel syndrome) 09/17/2018  . Healthcare maintenance 09/17/2018  . Primary osteoarthritis of right hip 09/03/2018  . Presence of right hip implant 09/03/2018  . Morbid obesity with BMI of 45.0-49.9, adult ([place]) 08/23/2018  . Shortness of breath 08/22/2018  . History of ductal carcinoma in situ (DCIS) of breast 07/20/2018  . Aromatase inhibitor  use 07/20/2018  . Abnormal findings on auscultation 07/16/2018  . Encounter for long-term (current) use of high-risk medication 07/16/2018  . Fibromyalgia 07/16/2018  . Other forms of systemic lupus erythematosus ([place]) 07/16/2018  . Other  osteoporosis without current pathological fracture 07/16/2018  . Seronegative arthritis 07/16/2018     Andrey Spearman, [place], OTR/L, [facility] 04/29/19 5:43 PM   [place] MAIN [facility] SERVICES [address] [place], [place], [address] Phone: [phone]   Fax:  [phone]  Name: Victoria [name] MRN: [id number] Date of Birth: 1961/01/31

## 2019-04-30 ENCOUNTER — Ambulatory Visit: Payer: Medicare Other | Admitting: Occupational Therapy

## 2019-04-30 ENCOUNTER — Other Ambulatory Visit: Payer: Self-pay

## 2019-04-30 DIAGNOSIS — I890 Lymphedema, not elsewhere classified: Secondary | ICD-10-CM | POA: Diagnosis not present

## 2019-04-30 NOTE — Therapy (Signed)
[place] MAIN [facility] SERVICES [address] [place], [place], [address] Phone: [phone]   Fax:  [phone]  Occupational Therapy Treatment  Patient Details  Name: Victoria [name] MRN: [id number] Date of Birth: [birth year] Referring Provider (OT): Marlowe Sax, MD   Encounter Date: 04/30/2019  OT End of Session - 04/30/19 1045    Visit Number  18    Number of Visits  36    Date for OT Re-Evaluation  05/11/19    OT Start Time  0907    OT Stop Time  1005    OT Time Calculation (min)  58 min    Activity Tolerance  Patient tolerated treatment well;No increased pain    Behavior During Therapy  WFL for tasks assessed/performed       Past Medical History:  Diagnosis Date  . Asthma   . Cancer (HCC)    Breast Left, DCIS   . Collagen vascular disease ([place])   . COPD (chronic obstructive pulmonary disease) ([place])   . Diverticulitis   . Fibromyalgia   . GERD (gastroesophageal reflux disease)   . Hearing impaired person, left   . IBS (irritable bowel syndrome)   . Lupus ([place])   . Mitral valve disorder   . Victoria (multiple sclerosis) ([place])   . Neuromuscular disorder ([place])   . Osteoarthritis   . Personal history of chemotherapy   . Personal history of radiation therapy 2017   left   . Rheumatoid arthritis ([place])   . Seizures ([place])     Past Surgical History:  Procedure Laterality Date  . ABDOMINAL HYSTERECTOMY    . APPENDECTOMY    . BREAST EXCISIONAL BIOPSY Left 2017   lumpectomy with radation  . BREAST EXCISIONAL BIOPSY Left 2004   neg surgical bx   . BREAST SURGERY    . CHOLECYSTECTOMY    . PATENT DUCTUS ARTERIOUS REPAIR    . RECONSTRUCTION TENDON PULLEY HAND Left   . TOTAL HIP ARTHROPLASTY Right     There were no vitals filed for this visit.  Subjective Assessment - 04/30/19 0920    Subjective   Victoria [name] presents for OT visit 18//36 to address BLE lipo-lymphedema. Pt presents without compression wraps in place. Pt c/o  lower back pain and L hip and thigh pain.    Pertinent History  Complex medical history includes several systemic, inflammatory conditions, including lupus, OA, RA, fibromyalgia. Complex medical history also includes additional contributing conditions including obesity,  hx recurrent DVT and single PE, hx LE cellulitis, asthma and COPD , Hx L breast Ca w SNLB, (Stage 0/ sub clinical LUE/LUQ lymphedema)  partial mastectomy and XRT (2017), [place], and R THA 2009.    Limitations  chronic B leg swelling and associated pain, difficulty walking, impaired transfers and functional mobility, decreased balance,, decreaased joint AROM 2/2 sweling at hips, knees ankles and toes ( difficulty bending to reach feet to perform LB bathing, skin and nail care,  and LB dressing) impaired activity tolerance  for activities requiring standing and/or walking > 20 minutes, (shopping, home management chores, cooking). Impaired body image. Impaired social participation    Repetition  Increases Symptoms    Special Tests  + Stemmer sign B feet    Pain Onset  Other (comment)   chronic                  OT Treatments/Exercises (OP) - 04/30/19 0001      ADLs   ADL Education Given  Yes  (  Pended)       Manual Therapy   Manual Therapy  Edema management  (Pended)     Manual Lymphatic Drainage (MLD)  LLE MLD as establ;ished  (Pended)     Compression Bandaging  LLE multilayer,  gradient  compression wraps from ankle to below knee as established  (Pended)              OT Education - 04/30/19 1045    Education Details  Continued skilled Pt/caregiver education  And LE ADL training throughout visit for lymphedema self care/ home program, including compression wrapping, compression garment and device wear/care, lymphatic pumping ther ex, simple self-MLD, and skin care. Discussed progress towards goals.    Person(s) Educated  Patient    Methods  Explanation;Demonstration;Handout    Comprehension  Verbalized  understanding;Returned demonstration;Need further instruction          OT Long Term Goals - 04/23/19 1052      OT LONG TERM GOAL #1   Title  Pt will demonstrate modified independance with lymphedema (LE) precautions and prevention strategies by identifying 6  items from list provided in LE Workbook  by 4th OT Rx visit to limit LE progression.    Baseline  Max A MET 02/23/19    Time  4   4th OT rx visit   Period  Days    Status  Achieved      OT LONG TERM GOAL #2   Title  Pt will be able to apply  knee length, multi-layer, short stretch compression wraps using correct gradient techniques with mod CG assistance  to achieve optimal limb volume reduction, and to return affected limb , as closely as possible, to premorbid size and shape, to limit infection risk and to improve tissue integrity.    Baseline  dependent 02/23/19 GOAL MET    Time  4    Period  Days    Status  Achieved      OT LONG TERM GOAL #3   Title  Pt to achieve at least 10% limb volume reduction below knees bilaterally  during Intensive Phase CDT  to improve tissue elasticity needed for optimal lymphatic function and joint AROM, , to return limbs to premorbid size and shape, to improve functional transfers and mobility, and to decrease infection risk.    Baseline  Max A 03/21/19: partially achieved with 13.93% volume reduction below the knee on L. RLE CDT  will commence once LLE garments are fitted . They are ordered.    Time  12    Period  Weeks    Status  Partially Met      OT LONG TERM GOAL #4   Title  Pt will achieve  100% compliance with daily LE self-care program with mod  caregiver assistance throughout Intensive Phase CDT, including daily  skin care, lymphatic pumping therex, simple self-MLD and prescribe compression to ensure optimal limb volume reduction, to reduce infection risk and to limit LE progression for optimal functional performance in all occupational domains.    Baseline  Max A    Time  12    Period   Weeks    Status  Achieved      OT LONG TERM GOAL #5   Title  Pt will demonstrate modified independence using assistive devices and extra time to don and doff properly fitting, custom compression garments/devices for optimal LE self-management over time.    Baseline  Max A    Time  12    Period  Weeks    Status  On-going      OT LONG TERM GOAL #6   Title  Pt will retain clinical BLE limb volume reductions over 6 months following intensive phase CDT ( management phase) with mod A from CG and by Pt consistently following all home program components daily to limit LE progression and further functional decline.    Baseline  Dependent    Time  6    Period  Months    Status  On-going            Plan - 04/30/19 1047    Clinical Impression Statement  Pt tolerated manual therapy today including MLD and LLE gradient compression wrapping.Pt continues to respond well to OT for CDT evidenced by ongoing Limb volume reduction and control and decreased pain in bilateral legs. Cont as per POC.    OT Occupational Profile and History  Comprehensive Assessment- Review of records and extensive additional review of physical, cognitive, psychosocial history related to current functional performance    Occupational performance deficits (Please refer to evaluation for details):  ADL's;IADL's;Social Participation;Leisure;Work;Play    Body Structure / Function / Physical Skills  ADL;Decreased knowledge of precautions;Flexibility;ROM;Balance;Decreased knowledge of use of DME;Mobility;Edema;Skin integrity;Obesity;Strength;IADL    Psychosocial Skills  Coping Strategies;Habits    Rehab Potential  Good    Clinical Decision Making  Multiple treatment options, significant modification of task necessary    Comorbidities Affecting Occupational Performance:  Presence of comorbidities impacting occupational performance    Comorbidities impacting occupational performance description:  see SUBJECTIVE    Modification or  Assistance to Complete Evaluation   Min-Moderate modification of tasks or assist with assess necessary to complete eval    OT Frequency  3x / week    OT Duration  12 weeks   and PRN to address bilateral involvement-one leg at a time to limit falls risk   OT Treatment/Interventions  Self-care/ADL training;Therapeutic exercise;Manual lymph drainage;Coping strategies training;Other (comment);Patient/family education;Compression bandaging;Therapeutic activities;Manual Therapy;DME and/or AE instruction    Plan  CDT: manua lymphatic drainage (MLD, skin care, lymphatic pumping therex, gradient compression wraps and compression garments / devices)    Consulted and Agree with Plan of Care  Patient       Patient will benefit from skilled therapeutic intervention in order to improve the following deficits and impairments:   Body Structure / Function / Physical Skills: ADL, Decreased knowledge of precautions, Flexibility, ROM, Balance, Decreased knowledge of use of DME, Mobility, Edema, Skin integrity, Obesity, Strength, IADL   Psychosocial Skills: Coping Strategies, Habits   Visit Diagnosis: Lymphedema, not elsewhere classified    Problem List Patient Active Problem List   Diagnosis Date Noted  . Thrombocytopenia ([place]) 01/23/2019  . Leukopenia 01/23/2019  . Lymphedema of both lower extremities 01/16/2019  . Polyarthralgia 10/14/2018  . Headache disorder 10/14/2018  . Chronic daily headache 10/11/2018  . COPD (chronic obstructive pulmonary disease) ([place]) 10/11/2018  . Thoracic aortic atherosclerosis ([place]) 10/11/2018  . Multiple episodes of deep venous thrombosis ([place]) 09/17/2018  . IBS (irritable bowel syndrome) 09/17/2018  . Healthcare maintenance 09/17/2018  . Primary osteoarthritis of right hip 09/03/2018  . Presence of right hip implant 09/03/2018  . Morbid obesity with BMI of 45.0-49.9, adult ([employer]) 08/23/2018  . Shortness of breath 08/22/2018  . History of ductal carcinoma in situ  (DCIS) of breast 07/20/2018  . Aromatase inhibitor use 07/20/2018  . Abnormal findings on auscultation 07/16/2018  . Encounter for long-term (current) use of high-risk medication 07/16/2018  .  Fibromyalgia 07/16/2018  . Other forms of systemic lupus erythematosus ([place]) 07/16/2018  . Other osteoporosis without current pathological fracture 07/16/2018  . Seronegative arthritis 07/16/2018    Andrey Spearman, [place], OTR/L, [facility] 04/30/19 10:53 AM  [place] MAIN [facility] SERVICES [address] [place], [place], [address] Phone: [phone]   Fax:  [phone]  Name: Victoria [name] MRN: [id number] Date of Birth: 1961-06-08

## 2019-05-01 ENCOUNTER — Encounter: Payer: Medicare Other | Admitting: Occupational Therapy

## 2019-05-01 ENCOUNTER — Ambulatory Visit: Payer: Medicare Other | Admitting: Occupational Therapy

## 2019-05-02 ENCOUNTER — Ambulatory Visit: Payer: Medicare Other | Admitting: Occupational Therapy

## 2019-05-02 ENCOUNTER — Other Ambulatory Visit: Payer: Self-pay

## 2019-05-02 ENCOUNTER — Encounter: Payer: Medicare Other | Admitting: Occupational Therapy

## 2019-05-02 DIAGNOSIS — I890 Lymphedema, not elsewhere classified: Secondary | ICD-10-CM | POA: Diagnosis not present

## 2019-05-02 NOTE — Therapy (Deleted)
[place] MAIN [facility] SERVICES [address] [place], [place], [address] Phone: [phone]   Fax:  [phone]  Occupational Therapy Treatment  Patient Details  Name: Victoria [name] MRN: [id number] Date of Birth: 1961-06-15 Referring Provider (OT): Victoria Sax, MD   Encounter Date: 05/02/2019  OT End of Session - 05/02/19 1237    Visit Number  19    Number of Visits  36    Date for OT Re-Evaluation  05/11/19    OT Start Time  1100    OT Stop Time  1215    OT Time Calculation (min)  75 min    Activity Tolerance  Patient tolerated treatment well;No increased pain    Behavior During Therapy  WFL for tasks assessed/performed       Past Medical History:  Diagnosis Date  . Asthma   . Cancer (HCC)    Breast Left, DCIS   . Collagen vascular disease ([place])   . COPD (chronic obstructive pulmonary disease) ([place])   . Diverticulitis   . Fibromyalgia   . GERD (gastroesophageal reflux disease)   . Hearing impaired person, left   . IBS (irritable bowel syndrome)   . Lupus ([place])   . Mitral valve disorder   . Victoria (multiple sclerosis) ([place])   . Neuromuscular disorder ([place])   . Osteoarthritis   . Personal history of chemotherapy   . Personal history of radiation therapy 2017   left   . Rheumatoid arthritis ([place])   . Seizures ([place])     Past Surgical History:  Procedure Laterality Date  . ABDOMINAL HYSTERECTOMY    . APPENDECTOMY    . BREAST EXCISIONAL BIOPSY Left 2017   lumpectomy with radation  . BREAST EXCISIONAL BIOPSY Left 2004   neg surgical bx   . BREAST SURGERY    . CHOLECYSTECTOMY    . PATENT DUCTUS ARTERIOUS REPAIR    . RECONSTRUCTION TENDON PULLEY HAND Left   . TOTAL HIP ARTHROPLASTY Right     There were no vitals filed for this visit.  Subjective Assessment - 05/02/19 1234    Subjective   Victoria [name] presents for OT visit 19//36 to address BLE lipo-lymphedema. Pt presents without compression wraps in place. Pt c/o  plantar fasciitis bilateralluy today. No difficulty tolerating compression wraps since last visit by reoprt..    Pertinent History  Complex medical history includes several systemic, inflammatory conditions, including lupus, OA, RA, fibromyalgia. Complex medical history also includes additional contributing conditions including obesity,  hx recurrent DVT and single PE, hx LE cellulitis, asthma and COPD , Hx L breast Ca w SNLB, (Stage 0/ sub clinical LUE/LUQ lymphedema)  partial mastectomy and XRT (2017), Victoria, and R THA 2009.    Limitations  chronic B leg swelling and associated pain, difficulty walking, impaired transfers and functional mobility, decreased balance,, decreaased joint AROM 2/2 sweling at hips, knees ankles and toes ( difficulty bending to reach feet to perform LB bathing, skin and nail care,  and LB dressing) impaired activity tolerance  for activities requiring standing and/or walking > 20 minutes, (shopping, home management chores, cooking). Impaired body image. Impaired social participation    Repetition  Increases Symptoms    Special Tests  + Stemmer sign B feet    Pain Onset  Other (comment)   chronic                  OT Treatments/Exercises (OP) - 05/02/19 0001      ADLs  ADL Education Given  Yes      Manual Therapy   Manual Therapy  Edema management;Manual Lymphatic Drainage (MLD);Compression Bandaging;Taping    Edema Management  kinesiotape to plantar surface of L foot  to reduce plantar fasciitis pian/discomfort.    Manual Lymphatic Drainage (MLD)  LLE MLD as establ;ished    Compression Bandaging  LLE multilayer,  gradient  compression wraps from ankle to below knee as established             OT Education - 05/02/19 1236    Education Details  Continued skilled Pt/caregiver education  And LE ADL training throughout visit for lymphedema self care/ home program, including compression wrapping, compression garment and device wear/care, lymphatic pumping  ther ex, simple self-MLD, and skin care. Discussed progress towards goals.    Person(s) Educated  Patient    Methods  Explanation;Demonstration;Handout    Comprehension  Verbalized understanding;Returned demonstration;Need further instruction          OT Long Term Goals - 04/23/19 1052      OT LONG TERM GOAL #1   Title  Pt will demonstrate modified independance with lymphedema (LE) precautions and prevention strategies by identifying 6  items from list provided in LE Workbook  by 4th OT Rx visit to limit LE progression.    Baseline  Max A MET 02/23/19    Time  4   4th OT rx visit   Period  Days    Status  Achieved      OT LONG TERM GOAL #2   Title  Pt will be able to apply  knee length, multi-layer, short stretch compression wraps using correct gradient techniques with mod CG assistance  to achieve optimal limb volume reduction, and to return affected limb , as closely as possible, to premorbid size and shape, to limit infection risk and to improve tissue integrity.    Baseline  dependent 02/23/19 GOAL MET    Time  4    Period  Days    Status  Achieved      OT LONG TERM GOAL #3   Title  Pt to achieve at least 10% limb volume reduction below knees bilaterally  during Intensive Phase CDT  to improve tissue elasticity needed for optimal lymphatic function and joint AROM, , to return limbs to premorbid size and shape, to improve functional transfers and mobility, and to decrease infection risk.    Baseline  Max A 03/21/19: partially achieved with 13.93% volume reduction below the knee on L. RLE CDT  will commence once LLE garments are fitted . They are ordered.    Time  12    Period  Weeks    Status  Partially Met      OT LONG TERM GOAL #4   Title  Pt will achieve  100% compliance with daily LE self-care program with mod  caregiver assistance throughout Intensive Phase CDT, including daily  skin care, lymphatic pumping therex, simple self-MLD and prescribe compression to ensure optimal  limb volume reduction, to reduce infection risk and to limit LE progression for optimal functional performance in all occupational domains.    Baseline  Max A    Time  12    Period  Weeks    Status  Achieved      OT LONG TERM GOAL #5   Title  Pt will demonstrate modified independence using assistive devices and extra time to don and doff properly fitting, custom compression garments/devices for optimal LE self-management over time.  Baseline  Max A    Time  12    Period  Weeks    Status  On-going      OT LONG TERM GOAL #6   Title  Pt will retain clinical BLE limb volume reductions over 6 months following intensive phase CDT ( management phase) with mod A from CG and by Pt consistently following all home program components daily to limit LE progression and further functional decline.    Baseline  Dependent    Time  6    Period  Months    Status  On-going            Plan - 05/02/19 1237    Clinical Impression Statement  LLE well congested after MLD today. Applied kinesiotape to plantar surface of L foot to reduce pain and inflammation  , which may in turn exacerbate LLE swelling. Cont as per POC. Pt continues to demonstrate progress towards all goals.    OT Occupational Profile and History  Comprehensive Assessment- Review of records and extensive additional review of physical, cognitive, psychosocial history related to current functional performance    Occupational performance deficits (Please refer to evaluation for details):  ADL's;IADL's;Social Participation;Leisure;Work;Play    Body Structure / Function / Physical Skills  ADL;Decreased knowledge of precautions;Flexibility;ROM;Balance;Decreased knowledge of use of DME;Mobility;Edema;Skin integrity;Obesity;Strength;IADL    Psychosocial Skills  Coping Strategies;Habits    Rehab Potential  Good    Clinical Decision Making  Multiple treatment options, significant modification of task necessary    Comorbidities Affecting  Occupational Performance:  Presence of comorbidities impacting occupational performance    Comorbidities impacting occupational performance description:  see SUBJECTIVE    Modification or Assistance to Complete Evaluation   Min-Moderate modification of tasks or assist with assess necessary to complete eval    OT Frequency  3x / week    OT Duration  12 weeks   and PRN to address bilateral involvement-one leg at a time to limit falls risk   OT Treatment/Interventions  Self-care/ADL training;Therapeutic exercise;Manual lymph drainage;Coping strategies training;Other (comment);Patient/family education;Compression bandaging;Therapeutic activities;Manual Therapy;DME and/or AE instruction    Plan  CDT: manua lymphatic drainage (MLD, skin care, lymphatic pumping therex, gradient compression wraps and compression garments / devices)    Consulted and Agree with Plan of Care  Patient       Patient will benefit from skilled therapeutic intervention in order to improve the following deficits and impairments:   Body Structure / Function / Physical Skills: ADL, Decreased knowledge of precautions, Flexibility, ROM, Balance, Decreased knowledge of use of DME, Mobility, Edema, Skin integrity, Obesity, Strength, IADL   Psychosocial Skills: Coping Strategies, Habits   Visit Diagnosis: Lymphedema, not elsewhere classified    Problem List Patient Active Problem List   Diagnosis Date Noted  . Thrombocytopenia ([place]) 01/23/2019  . Leukopenia 01/23/2019  . Lymphedema of both lower extremities 01/16/2019  . Polyarthralgia 10/14/2018  . Headache disorder 10/14/2018  . Chronic daily headache 10/11/2018  . COPD (chronic obstructive pulmonary disease) ([place]) 10/11/2018  . Thoracic aortic atherosclerosis ([place]) 10/11/2018  . Multiple episodes of deep venous thrombosis ([place]) 09/17/2018  . IBS (irritable bowel syndrome) 09/17/2018  . Healthcare maintenance 09/17/2018  . Primary osteoarthritis of right hip  09/03/2018  . Presence of right hip implant 09/03/2018  . Morbid obesity with BMI of 45.0-49.9, adult ([place]) 08/23/2018  . Shortness of breath 08/22/2018  . History of ductal carcinoma in situ (DCIS) of breast 07/20/2018  . Aromatase inhibitor use 07/20/2018  . Abnormal  findings on auscultation 07/16/2018  . Encounter for long-term (current) use of high-risk medication 07/16/2018  . Fibromyalgia 07/16/2018  . Other forms of systemic lupus erythematosus ([place]) 07/16/2018  . Other osteoporosis without current pathological fracture 07/16/2018  . Seronegative arthritis 07/16/2018    Andrey Spearman, [place], OTR/L, [facility] 05/02/19 12:39 PM   [place] MAIN [facility] SERVICES [address] [place], [place], [address] Phone: [phone]   Fax:  [phone]  Name: Victoria [name] MRN: [id number] Date of Birth: January 15, 1961

## 2019-05-06 ENCOUNTER — Inpatient Hospital Stay (HOSPITAL_BASED_OUTPATIENT_CLINIC_OR_DEPARTMENT_OTHER): Payer: Medicare Other | Admitting: Oncology

## 2019-05-06 ENCOUNTER — Encounter: Payer: Self-pay | Admitting: Oncology

## 2019-05-06 ENCOUNTER — Inpatient Hospital Stay: Payer: Medicare Other | Attending: Oncology | Admitting: *Deleted

## 2019-05-06 ENCOUNTER — Other Ambulatory Visit: Payer: Self-pay

## 2019-05-06 VITALS — BP 140/90 | HR 70 | Temp 97.6°F | Resp 16 | Wt 192.3 lb

## 2019-05-06 DIAGNOSIS — Z95828 Presence of other vascular implants and grafts: Secondary | ICD-10-CM | POA: Diagnosis not present

## 2019-05-06 DIAGNOSIS — R2232 Localized swelling, mass and lump, left upper limb: Secondary | ICD-10-CM

## 2019-05-06 DIAGNOSIS — Z7901 Long term (current) use of anticoagulants: Secondary | ICD-10-CM | POA: Diagnosis not present

## 2019-05-06 DIAGNOSIS — Z86718 Personal history of other venous thrombosis and embolism: Secondary | ICD-10-CM | POA: Diagnosis not present

## 2019-05-06 DIAGNOSIS — F17210 Nicotine dependence, cigarettes, uncomplicated: Secondary | ICD-10-CM | POA: Diagnosis not present

## 2019-05-06 DIAGNOSIS — D649 Anemia, unspecified: Secondary | ICD-10-CM | POA: Diagnosis not present

## 2019-05-06 DIAGNOSIS — Z79899 Other long term (current) drug therapy: Secondary | ICD-10-CM | POA: Diagnosis not present

## 2019-05-06 DIAGNOSIS — D696 Thrombocytopenia, unspecified: Secondary | ICD-10-CM

## 2019-05-06 DIAGNOSIS — M069 Rheumatoid arthritis, unspecified: Secondary | ICD-10-CM | POA: Diagnosis not present

## 2019-05-06 DIAGNOSIS — D72819 Decreased white blood cell count, unspecified: Secondary | ICD-10-CM

## 2019-05-06 DIAGNOSIS — D0512 Intraductal carcinoma in situ of left breast: Secondary | ICD-10-CM | POA: Diagnosis present

## 2019-05-06 DIAGNOSIS — Z79811 Long term (current) use of aromatase inhibitors: Secondary | ICD-10-CM | POA: Insufficient documentation

## 2019-05-06 DIAGNOSIS — G35 Multiple sclerosis: Secondary | ICD-10-CM | POA: Insufficient documentation

## 2019-05-06 DIAGNOSIS — Z86000 Personal history of in-situ neoplasm of breast: Secondary | ICD-10-CM

## 2019-05-06 DIAGNOSIS — M810 Age-related osteoporosis without current pathological fracture: Secondary | ICD-10-CM

## 2019-05-06 LAB — CBC WITH DIFFERENTIAL/PLATELET
Abs Immature Granulocytes: 0.01 10*3/uL (ref 0.00–0.07)
Basophils Absolute: 0 10*3/uL (ref 0.0–0.1)
Basophils Relative: 1 %
Eosinophils Absolute: 0 10*3/uL (ref 0.0–0.5)
Eosinophils Relative: 0 %
HCT: 37.8 % (ref 36.0–46.0)
Hemoglobin: 12.1 g/dL (ref 12.0–15.0)
Immature Granulocytes: 0 %
Lymphocytes Relative: 24 %
Lymphs Abs: 0.8 10*3/uL (ref 0.7–4.0)
MCH: 29.2 pg (ref 26.0–34.0)
MCHC: 32 g/dL (ref 30.0–36.0)
MCV: 91.1 fL (ref 80.0–100.0)
Monocytes Absolute: 0.2 10*3/uL (ref 0.1–1.0)
Monocytes Relative: 7 %
Neutro Abs: 2.1 10*3/uL (ref 1.7–7.7)
Neutrophils Relative %: 68 %
Platelets: 111 10*3/uL — ABNORMAL LOW (ref 150–400)
RBC: 4.15 MIL/uL (ref 3.87–5.11)
RDW: 14.6 % (ref 11.5–15.5)
WBC: 3.2 10*3/uL — ABNORMAL LOW (ref 4.0–10.5)
nRBC: 0 % (ref 0.0–0.2)

## 2019-05-06 LAB — COMPREHENSIVE METABOLIC PANEL
ALT: 21 U/L (ref 0–44)
AST: 20 U/L (ref 15–41)
Albumin: 4.1 g/dL (ref 3.5–5.0)
Alkaline Phosphatase: 67 U/L (ref 38–126)
Anion gap: 10 (ref 5–15)
BUN: 20 mg/dL (ref 6–20)
CO2: 22 mmol/L (ref 22–32)
Calcium: 9.4 mg/dL (ref 8.9–10.3)
Chloride: 107 mmol/L (ref 98–111)
Creatinine, Ser: 0.75 mg/dL (ref 0.44–1.00)
GFR calc Af Amer: >60 mL/min (ref >60)
GFR calc non Af Amer: >60 mL/min (ref >60)
Glucose, Bld: 105 mg/dL — ABNORMAL HIGH (ref 70–99)
Potassium: 4 mmol/L (ref 3.5–5.1)
Sodium: 139 mmol/L (ref 135–145)
Total Bilirubin: 0.4 mg/dL (ref 0.3–1.2)
Total Protein: 6.9 g/dL (ref 6.5–8.1)

## 2019-05-06 MED ORDER — HEPARIN SOD (PORK) LOCK FLUSH 100 UNIT/ML IV SOLN
500.0000 [IU] | Freq: Once | INTRAVENOUS | Status: AC
  Administered 2019-05-06: 500 [IU] via INTRAVENOUS

## 2019-05-06 MED ORDER — LETROZOLE 2.5 MG PO TABS: 2.5000 mg | ORAL_TABLET | Freq: Every day | ORAL | 3 refills | Status: DC

## 2019-05-06 MED ORDER — SODIUM CHLORIDE 0.9% FLUSH
10.0000 mL | Freq: Once | INTRAVENOUS | Status: AC
  Administered 2019-05-06: 10 mL via INTRAVENOUS
  Filled 2019-05-06: qty 10

## 2019-05-06 NOTE — Therapy (Signed)
[place] MAIN [facility] SERVICES [address] [place], [place], [address] Phone: [phone]   Fax:  [phone]  Occupational Therapy Treatment  Patient Details  Name: Victoria [name] MRN: [id number] Date of Birth: 06-25-1961 Referring Provider (OT): Marlowe Sax, MD   Encounter Date: 05/02/2019    Past Medical History:  Diagnosis Date  . Asthma   . Cancer (HCC)    Breast Left, DCIS   . Collagen vascular disease ([place])   . COPD (chronic obstructive pulmonary disease) ([place])   . Diverticulitis   . Fibromyalgia   . GERD (gastroesophageal reflux disease)   . Hearing impaired person, left   . IBS (irritable bowel syndrome)   . Lupus ([place])   . Mitral valve disorder   . MS (multiple sclerosis) ([place])   . Neuromuscular disorder ([place])   . Osteoarthritis   . Personal history of chemotherapy   . Personal history of radiation therapy 2017   left   . Rheumatoid arthritis ([place])   . Seizures ([place])     Past Surgical History:  Procedure Laterality Date  . ABDOMINAL HYSTERECTOMY    . APPENDECTOMY    . BREAST EXCISIONAL BIOPSY Left 2017   lumpectomy with radation  . BREAST EXCISIONAL BIOPSY Left 2004   neg surgical bx   . BREAST SURGERY    . CHOLECYSTECTOMY    . PATENT DUCTUS ARTERIOUS REPAIR    . RECONSTRUCTION TENDON PULLEY HAND Left   . TOTAL HIP ARTHROPLASTY Right     There were no vitals filed for this visit.                             OT Long Term Goals - 04/23/19 1052      OT LONG TERM GOAL #1   Title  Pt will demonstrate modified independance with lymphedema (LE) precautions and prevention strategies by identifying 6  items from list provided in LE Workbook  by 4th OT Rx visit to limit LE progression.    Baseline  Max A MET 02/23/19    Time  4   4th OT rx visit   Period  Days    Status  Achieved      OT LONG TERM GOAL #2   Title  Pt will be able to apply  knee length, multi-layer, short  stretch compression wraps using correct gradient techniques with mod CG assistance  to achieve optimal limb volume reduction, and to return affected limb , as closely as possible, to premorbid size and shape, to limit infection risk and to improve tissue integrity.    Baseline  dependent 02/23/19 GOAL MET    Time  4    Period  Days    Status  Achieved      OT LONG TERM GOAL #3   Title  Pt to achieve at least 10% limb volume reduction below knees bilaterally  during Intensive Phase CDT  to improve tissue elasticity needed for optimal lymphatic function and joint AROM, , to return limbs to premorbid size and shape, to improve functional transfers and mobility, and to decrease infection risk.    Baseline  Max A 03/21/19: partially achieved with 13.93% volume reduction below the knee on L. RLE CDT  will commence once LLE garments are fitted . They are ordered.    Time  12    Period  Weeks    Status  Partially Met      OT  LONG TERM GOAL #4   Title  Pt will achieve  100% compliance with daily LE self-care program with mod  caregiver assistance throughout Intensive Phase CDT, including daily  skin care, lymphatic pumping therex, simple self-MLD and prescribe compression to ensure optimal limb volume reduction, to reduce infection risk and to limit LE progression for optimal functional performance in all occupational domains.    Baseline  Max A    Time  12    Period  Weeks    Status  Achieved      OT LONG TERM GOAL #5   Title  Pt will demonstrate modified independence using assistive devices and extra time to don and doff properly fitting, custom compression garments/devices for optimal LE self-management over time.    Baseline  Max A    Time  12    Period  Weeks    Status  On-going      OT LONG TERM GOAL #6   Title  Pt will retain clinical BLE limb volume reductions over 6 months following intensive phase CDT ( management phase) with mod A from CG and by Pt consistently following all home program  components daily to limit LE progression and further functional decline.    Baseline  Dependent    Time  6    Period  Months    Status  On-going            Plan - 05/06/19 1205    Clinical Impression Statement  LLE well congested after MLD today. Applied kinesiotape to plantar surface of L foot to reduce pain and inflammation. which may in turn exacerbate LLE swelling. Pt continues to demonstrate significant progress towards all goals; however, LLE limb volume reduction has reached a clinical plateau over the last 2 weeks. We have not yet commenced CDT to RLE, but will do so once custom compression garments are fitted for LLE. Despite achieving significant clinical gains and progress towards all OT goals since commencing OT for CDT in July, 2020, chronic, progressive lipo-lymphedema persists as evidenced by fatty fibrosis and protein rich hyperplasia is palpable. To limit progression of chronic, BLE lipo-lymphedema over time, a sequential pneumatic compression device providing proximal to distal decongestion is medically necessary. Cont as per POC. Fit custom garments ASAP (on order for LLE). Commence RLE CDT once LLE garment fitting is completed.    OT Occupational Profile and History  Comprehensive Assessment- Review of records and extensive additional review of physical, cognitive, psychosocial history related to current functional performance    Occupational performance deficits (Please refer to evaluation for details):  ADL's;IADL's;Social Participation;Leisure;Work;Play    Body Structure / Function / Physical Skills  ADL;Decreased knowledge of precautions;Flexibility;ROM;Balance;Decreased knowledge of use of DME;Mobility;Edema;Skin integrity;Obesity;Strength;IADL    Psychosocial Skills  Coping Strategies;Habits    Rehab Potential  Good    Clinical Decision Making  Multiple treatment options, significant modification of task necessary    Comorbidities Affecting Occupational Performance:   Presence of comorbidities impacting occupational performance    Comorbidities impacting occupational performance description:  see SUBJECTIVE    Modification or Assistance to Complete Evaluation   Min-Moderate modification of tasks or assist with assess necessary to complete eval    OT Frequency  3x / week    OT Duration  12 weeks   and PRN to address bilateral involvement-one leg at a time to limit falls risk   OT Treatment/Interventions  Self-care/ADL training;Therapeutic exercise;Manual lymph drainage;Coping strategies training;Other (comment);Patient/family education;Compression bandaging;Therapeutic activities;Manual Therapy;DME and/or AE instruction  Plan  CDT: manua lymphatic drainage (MLD, skin care, lymphatic pumping therex, gradient compression wraps and compression garments / devices)    Consulted and Agree with Plan of Care  Patient       Patient will benefit from skilled therapeutic intervention in order to improve the following deficits and impairments:   Body Structure / Function / Physical Skills: ADL, Decreased knowledge of precautions, Flexibility, ROM, Balance, Decreased knowledge of use of DME, Mobility, Edema, Skin integrity, Obesity, Strength, IADL   Psychosocial Skills: Coping Strategies, Habits   Visit Diagnosis: Lymphedema, not elsewhere classified    Problem List Patient Active Problem List   Diagnosis Date Noted  . Thrombocytopenia ([place]) 01/23/2019  . Leukopenia 01/23/2019  . Lymphedema of both lower extremities 01/16/2019  . Polyarthralgia 10/14/2018  . Headache disorder 10/14/2018  . Chronic daily headache 10/11/2018  . COPD (chronic obstructive pulmonary disease) ([place]) 10/11/2018  . Thoracic aortic atherosclerosis ([place]) 10/11/2018  . Multiple episodes of deep venous thrombosis ([place]) 09/17/2018  . IBS (irritable bowel syndrome) 09/17/2018  . Healthcare maintenance 09/17/2018  . Primary osteoarthritis of right hip 09/03/2018  . Presence of right hip  implant 09/03/2018  . Morbid obesity with BMI of 45.0-49.9, adult ([place]) 08/23/2018  . Shortness of breath 08/22/2018  . History of ductal carcinoma in situ (DCIS) of breast 07/20/2018  . Aromatase inhibitor use 07/20/2018  . Abnormal findings on auscultation 07/16/2018  . Encounter for long-term (current) use of high-risk medication 07/16/2018  . Fibromyalgia 07/16/2018  . Other forms of systemic lupus erythematosus ([employer]) 07/16/2018  . Other osteoporosis without current pathological fracture 07/16/2018  . Seronegative arthritis 07/16/2018    Andrey Spearman, MS, OTR/L, [facility] 05/06/19 12:11 PM  [place] MAIN [facility] SERVICES [address] [place], [place], [address] Phone: [phone]   Fax:  [phone]  Name: Dustin Burrill MRN: [id number] Date of Birth: 1961/07/15

## 2019-05-06 NOTE — Progress Notes (Signed)
Patient does not offer any problems today.  

## 2019-05-07 ENCOUNTER — Ambulatory Visit: Payer: Medicare Other | Admitting: Occupational Therapy

## 2019-05-07 DIAGNOSIS — I890 Lymphedema, not elsewhere classified: Secondary | ICD-10-CM | POA: Diagnosis not present

## 2019-05-07 LAB — MISCELLANEOUS TEST

## 2019-05-07 NOTE — Progress Notes (Signed)
Hematology/Oncology follow up note [facility] Telephone:([phone] [phone]   Patient Care Team: Kirk Ruths, MD as PCP - General (Internal Medicine)  REFERRING PROVIDER: Kirk Ruths, MD CHIEF COMPLAINTS/REASON FOR VISIT:  Evaluation of history of breast cancer  HISTORY OF PRESENTING ILLNESS:  Victoria [name] is a  58 y.o.  female with PMH listed below who was referred to me for evaluation of history of breast cancer Patient moved from [place]. Reports she was diagnosed with left breast cancer 2 years ago, hormone receptor positive.  Status post lumpectomy and radiation. Chemotherapy.  Patient is on letrozole since December 2017.  Tolerates well.  His previous oncologist [place] cancer specialist in [place], [place].  She also has history of rheumatoid arthritis/lupus follows up with Dr. Cydney Ok.  Recalls that she has had DEXA scan done about 2 years ago. Reports mammogram was done about 1 year ago.  Also report having left lower extremity blood clot 3 years ago, started on Eliquis for a month and developed pulmonary embolism.  Switch to Pradaxa.  She also had IVC filter.  After she moved to [place], she was temporarily off Pradaxa.  Currently back on anticoagulation. She presented to emergency room on 06/25/2018 for evaluation of right arm pain, complaining about developing knots right upper extremity.  # obtained previous oncology history from [place]. Medical records were reviewed by me.  Patient was diagnosed with left breast DCIS in 2017, s/p lumpectomy and radiation.  Initially was started on Arimidex however cannot tolerate and was switched to Letrozole.   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  She has also established care with Rheumatology.   INTERVAL HISTORY Victoria [name] is a 58 y.o.  female who has above history reviewed by me today presents for follow up visit for management of history of DCIS.  Patient denies any new concern of her breast.  She takes letrozole daily.  Tolerating well.  Manageable hot flashes. Patient has osteoporosis.  She takes calcium and vitamin D. I recommended patient to obtain dental clearance for starting bisphosphonate.  She has not yet obtained a clearance letter yet. Mammogram was done in July 2020 and was negative for recurrence. Chronic joint pain, at baseline.  Review of Systems  Constitutional: Positive for fatigue. Negative for appetite change, chills and fever.  HENT:   Negative for hearing loss and voice change.   Eyes: Negative for eye problems.  Respiratory: Negative for chest tightness and cough.   Cardiovascular: Negative for chest pain.  Gastrointestinal: Negative for abdominal distention, abdominal pain and blood in stool.  Endocrine: Negative for hot flashes.  Genitourinary: Negative for difficulty urinating and frequency.   Musculoskeletal: Positive for arthralgias, back pain and myalgias.  Skin: Negative for itching and rash.  Neurological: Negative for extremity weakness.  Hematological: Negative for adenopathy.  Psychiatric/Behavioral: Negative for confusion.    MEDICAL HISTORY:  Past Medical History:  Diagnosis Date   Asthma    Cancer ([place])    Breast Left, DCIS    Collagen vascular disease ([place])    COPD (chronic obstructive pulmonary disease) (HCC)    Diverticulitis    Fibromyalgia    GERD (gastroesophageal reflux disease)    Hearing impaired person, left    IBS (irritable bowel syndrome)    Lupus (HCC)    Mitral valve disorder    MS (multiple sclerosis) ([place])  Neuromuscular disorder ([place])    Osteoarthritis    Personal history of chemotherapy    Personal history of radiation therapy 2017   left    Rheumatoid arthritis ([place])    Seizures ([place])     SURGICAL HISTORY: Past Surgical  History:  Procedure Laterality Date   ABDOMINAL HYSTERECTOMY     APPENDECTOMY     BREAST EXCISIONAL BIOPSY Left 2017   lumpectomy with radation   BREAST EXCISIONAL BIOPSY Left 2004   neg surgical bx    BREAST SURGERY     CHOLECYSTECTOMY     PATENT DUCTUS ARTERIOUS REPAIR     RECONSTRUCTION TENDON PULLEY HAND Left    TOTAL HIP ARTHROPLASTY Right     SOCIAL HISTORY: Social History   Socioeconomic History   Marital status: Widowed    Spouse name: Not on file   Number of children: 1   Years of education: Not on file   Highest education level: Some college, no degree  Occupational History   Not on file  Social Needs   Financial resource strain: Not hard at all   Food insecurity    Worry: Never true    Inability: Never true   Transportation needs    Medical: No    Non-medical: No  Tobacco Use   Smoking status: Former Smoker    Packs/day: 2.00    Years: 15.00    Pack years: 30.00    Types: Cigarettes    Quit date: 06/27/1999    Years since quitting: 19.8   Smokeless tobacco: Never Used  Substance and Sexual Activity   Alcohol use: Yes    Frequency: Never    Comment: Rarely - approx 6 beers once a month   Drug use: Never   Sexual activity: Not Currently  Lifestyle   Physical activity    Days per week: 0 days    Minutes per session: 0 min   Stress: Not at all  Relationships   Social connections    Talks on phone: Not on file    Gets together: Not on file    Attends religious service: More than 4 times per year    Active member of club or organization: No    Attends meetings of clubs or organizations: Never    Relationship status: Widowed   Intimate partner violence    Fear of current or ex partner: No    Emotionally abused: No    Physically abused: No    Forced sexual activity: No  Other Topics Concern   Not on file  Social History Narrative   Not on file    FAMILY HISTORY: Family History  Problem Relation Age of Onset    COPD Mother    Stroke Father    Arthritis Brother    Diabetes Brother    Stroke Brother     ALLERGIES:  is allergic to sodium hypochlorite; codeine; flagyl [metronidazole]; and sulfa antibiotics.  MEDICATIONS:  Current Outpatient Medications  Medication Sig Dispense Refill   albuterol (PROVENTIL HFA;VENTOLIN HFA) 108 (90 Base) MCG/ACT inhaler Inhale 2 puffs into the lungs every 6 (six) hours as needed for wheezing or shortness of breath. 1 Inhaler 2   atorvastatin (LIPITOR) 40 MG tablet Take 40 mg by mouth daily.     benzonatate (TESSALON) 100 MG capsule Take 1 capsule (100 mg total) by mouth 3 (three) times daily as needed. 30 capsule 0   budesonide-formoterol (SYMBICORT) 160-4.5 MCG/ACT inhaler Inhale 2 puffs into the lungs 2 (two) times daily.  1 Inhaler 12   cyclobenzaprine (FLEXERIL) 10 MG tablet TAKE 1 TABLET BY MOUTH TWICE A DAY AS NEEDED FOR MUSCLE SPASMS 40 tablet 0   dabigatran (PRADAXA) 150 MG CAPS capsule Take 1 capsule (150 mg total) by mouth 2 (two) times daily. 180 capsule 3   gabapentin (NEURONTIN) 300 MG capsule Take 600 mg by mouth 3 (three) times daily.     HYDROcodone-acetaminophen (NORCO) 7.5-325 MG tablet Take 1 tablet by mouth 2 (two) times daily as needed for moderate pain. 45 tablet 0   hydroxychloroquine (PLAQUENIL) 200 MG tablet Take 200 mg by mouth daily.     ibuprofen (ADVIL) 600 MG tablet      leflunomide (ARAVA) 20 MG tablet Take 20 mg by mouth daily.     letrozole (FEMARA) 2.5 MG tablet Take 1 tablet (2.5 mg total) by mouth daily. 30 tablet 3   omeprazole (PRILOSEC) 40 MG capsule Take 1 capsule (40 mg total) by mouth 2 (two) times daily. 90 capsule 1   ondansetron (ZOFRAN-ODT) 8 MG disintegrating tablet Take by mouth.     potassium chloride (K-DUR) 10 MEQ tablet Take 10 mEq by mouth daily.     Tiotropium Bromide Monohydrate (SPIRIVA RESPIMAT) 2.5 MCG/ACT AERS Inhale 2 puffs into the lungs every morning. 1 Inhaler 11   Topiramate (TOPAMAX  PO) Take 50 mg by mouth 2 (two) times daily.      torsemide (DEMADEX) 10 MG tablet      traZODone (DESYREL) 50 MG tablet Take 3 tablets (150 mg total) by mouth at bedtime. 180 tablet 1   No current facility-administered medications for this visit.    Facility-Administered Medications Ordered in Other Visits  Medication Dose Route Frequency Provider Last Rate Last Dose   sodium chloride flush (NS) 0.9 % injection 10 mL  10 mL Intravenous PRN Earlie Server, MD   10 mL at 01/22/19 1503     PHYSICAL EXAMINATION: ECOG PERFORMANCE STATUS: 1 - Symptomatic but completely ambulatory Vitals:   05/06/19 1034  BP: 140/90  Pulse: 70  Resp: 16  Temp: 97.6 F (36.4 C)   Filed Weights   05/06/19 1034  Weight: 192 lb 4.8 oz (87.2 kg)    Physical Exam  Constitutional: She is oriented to person, place, and time. No distress.  Obese  HENT:  Head: Normocephalic and atraumatic.  Nose: Nose normal.  Mouth/Throat: Oropharynx is clear and moist. No oropharyngeal exudate.  Eyes: Pupils are equal, round, and reactive to light. EOM are normal. No scleral icterus.  Neck: Normal range of motion. Neck supple.  Cardiovascular: Normal rate and regular rhythm.  No murmur heard. Pulmonary/Chest: Effort normal. No respiratory distress.  Abdominal: Soft. She exhibits no distension. There is no abdominal tenderness.  Musculoskeletal: Normal range of motion.        General: No edema.  Neurological: She is alert and oriented to person, place, and time. No cranial nerve deficit. She exhibits normal muscle tone. Coordination normal.  Skin: Skin is warm and dry. She is not diaphoretic. No erythema.  Psychiatric: Affect normal.  Declined breast examination today.   LABORATORY DATA:  I have reviewed the data as listed Lab Results  Component Value Date   WBC 3.2 (L) 05/06/2019   HGB 12.1 05/06/2019   HCT 37.8 05/06/2019   MCV 91.1 05/06/2019   PLT 111 (L) 05/06/2019   Recent Labs    06/04/18 1646  01/22/19 1415 04/03/19 1331 05/06/19 1002  NA 140 139  --  139  K  3.8 3.6  --  4.0  CL 106 111  --  107  CO2 23 22  --  22  GLUCOSE 88 94  --  105*  BUN 18 13  --  20  CREATININE 0.55 0.68 0.60 0.75  CALCIUM 9.2 8.8*  --  9.4  GFRNONAA >60 >60  --  >60  GFRAA >60 >60  --  >60  PROT 6.2* 6.4*  --  6.9  ALBUMIN 3.7 3.8  --  4.1  AST 21 20  --  20  ALT 24 24  --  21  ALKPHOS 62 67  --  67  BILITOT 0.4 0.2*  --  0.4   Iron/TIBC/Ferritin/ %Sat No results found for: IRON, TIBC, FERRITIN, IRONPCTSAT   RADIOGRAPHIC STUDIES: I have personally reviewed the radiological images as listed and agreed with the findings in the report. US venous upper right extremity 06/04/2018  No evidence of DVT within the right upper extremity 07/09/2018 CXR  Interval resolution of subsegmental atelectasis or interstitial edema. Mild chronic bronchitic changes. No acute pneumonia nor pulmonary edema. Thoracic aortic atherosclerosis  Ct Abdomen Pelvis W Contrast  Result Date: 04/03/2019 CLINICAL DATA:  58 year old with left lower quadrant pain. History of diverticulitis and breast cancer. EXAM: CT ABDOMEN AND PELVIS WITH CONTRAST TECHNIQUE: Multidetector CT imaging of the abdomen and pelvis was performed using the standard protocol following bolus administration of intravenous contrast. CONTRAST:  119mL OMNIPAQUE IOHEXOL 300 MG/ML  SOLN COMPARISON:  CT 07/29/2018 and 05/06/2018 FINDINGS: Lower chest: Small pleural-based dependent densities in the right lower lobe are similar to the prior examinations and suggestive for mild scarring. No pleural effusions. Central line tip in the upper right atrium region. Hepatobiliary: Cholecystectomy. Subtle hypodensity or heterogeneity in the posterior right hepatic lobe on sequence 2, image 22 is similar to the previous examination. No distinct lesion in this area. No biliary dilatation. Pancreas: Unremarkable. No pancreatic ductal dilatation or surrounding inflammatory changes.  Spleen: Normal in size without focal abnormality. Adrenals/Urinary Tract: Normal adrenal glands. Limited evaluation of the urinary bladder due to artifact from the right hip replacement. No significant bladder distension. Normal appearance of both kidneys. No hydronephrosis. Stomach/Bowel: Normal appearance of the stomach and duodenum. No evidence for bowel dilatation or obstruction. Multiple diverticula involving the lower descending colon and sigmoid colon. Minimal pericolonic stranding in the left lower quadrant on sequence 2, image 64. No evidence for extraluminal gas or fluid in this area. Vascular/Lymphatic: Atherosclerotic calcifications involving the abdominal aorta without aneurysm. Venous structures are patent including the IVC and the renal veins. Again noted is an IVC filter placed in the juxtarenal IVC. The filter is placed across the right renal vein and the shoulder of the filter appears to be at the level of the left renal vein. Suprarenal IVC is patent. Portal venous system is patent. Reproductive: Status post hysterectomy. No adnexal masses. Other: Negative for ascites. Small umbilical hernia containing fat. Negative for free air. Musculoskeletal: Right hip replacement is located. IMPRESSION: 1. Colonic diverticula involving the descending colon and sigmoid colon. Question minimal pericolonic stranding in the left lower quadrant. Cannot exclude subtle acute diverticulitis in left lower quadrant. No evidence for extraluminal gas or abscess collections. 2. Stable position of the IVC filter positioned in the juxtarenal IVC. The filter is covering the origin of the right renal vein. IVC filter position is not optimal at this location but unchanged from the prior examinations. No evidence for IVC or renal vein thrombus. Electronically Signed   By:  Markus Daft M.D.   On: 04/03/2019 15:11   Mm Diag Breast Tomo Uni Left  Result Date: 02/11/2019 CLINICAL DATA:  58 year old presenting with a palpable mass  versus palpable normal soft tissue in the LEFT axilla. Personal history of malignant lumpectomy of the UPPER OUTER QUADRANT of the LEFT breast in 2017, pathology DCIS. EXAM: DIGITAL DIAGNOSTIC LEFT MAMMOGRAM WITH CAD AND TOMO ULTRASOUND LEFT AXILLA COMPARISON:  Previous exam(s). ACR Breast Density Category b: There are scattered areas of fibroglandular density. FINDINGS: Tomosynthesis and synthesized full field CC and MLO views of the LEFT breast were obtained. Tomosynthesis and synthesized spot compression tangential view of the area of concern in the LEFT axilla was also obtained. No mammographic abnormality in the LEFT axilla in the area of clinical concern. Post lumpectomy scar/architectural distortion involving the UPPER OUTER QUADRANT of the LEFT breast at POSTERIOR depth. No new or suspicious findings in the LEFT breast. Mammographic images were processed with CAD. On correlative physical exam, there is no palpable mass or lymphadenopathy in the UPPER LEFT axilla. An upper arm muscle is palpated in the area of concern. Targeted LEFT axillary ultrasound is performed, showing a normal-appearing muscle in the area of palpable concern. No mass or pathologic lymphadenopathy is identified. IMPRESSION: 1. No mass or pathologic lymphadenopathy involving the LEFT axilla. 2. No mammographic evidence of malignancy involving the LEFT breast. 3. Expected post lumpectomy changes involving the LEFT breast. RECOMMENDATION: Annual BILATERAL diagnostic mammography which is due in late December, 2020. I have discussed the findings and recommendations with the patient. If applicable, a reminder letter will be sent to the patient regarding the next appointment. BI-RADS CATEGORY  2: Benign. Electronically Signed   By: Evangeline Dakin M.D.   On: 02/11/2019 12:08   [place] Axilla Left  Result Date: 02/11/2019 CLINICAL DATA:  58 year old presenting with a palpable mass versus palpable normal soft tissue in the LEFT axilla. Personal  history of malignant lumpectomy of the UPPER OUTER QUADRANT of the LEFT breast in 2017, pathology DCIS. EXAM: DIGITAL DIAGNOSTIC LEFT MAMMOGRAM WITH CAD AND TOMO ULTRASOUND LEFT AXILLA COMPARISON:  Previous exam(s). ACR Breast Density Category b: There are scattered areas of fibroglandular density. FINDINGS: Tomosynthesis and synthesized full field CC and MLO views of the LEFT breast were obtained. Tomosynthesis and synthesized spot compression tangential view of the area of concern in the LEFT axilla was also obtained. No mammographic abnormality in the LEFT axilla in the area of clinical concern. Post lumpectomy scar/architectural distortion involving the UPPER OUTER QUADRANT of the LEFT breast at POSTERIOR depth. No new or suspicious findings in the LEFT breast. Mammographic images were processed with CAD. On correlative physical exam, there is no palpable mass or lymphadenopathy in the UPPER LEFT axilla. An upper arm muscle is palpated in the area of concern. Targeted LEFT axillary ultrasound is performed, showing a normal-appearing muscle in the area of palpable concern. No mass or pathologic lymphadenopathy is identified. IMPRESSION: 1. No mass or pathologic lymphadenopathy involving the LEFT axilla. 2. No mammographic evidence of malignancy involving the LEFT breast. 3. Expected post lumpectomy changes involving the LEFT breast. RECOMMENDATION: Annual BILATERAL diagnostic mammography which is due in late December, 2020. I have discussed the findings and recommendations with the patient. If applicable, a reminder letter will be sent to the patient regarding the next appointment. BI-RADS CATEGORY  2: Benign. Electronically Signed   By: Evangeline Dakin M.D.   On: 02/11/2019 12:08    ASSESSMENT & PLAN:  1. History of  ductal carcinoma in situ (DCIS) of breast   2. Port-A-Cath in place   3. Thrombocytopenia ([place])   4. Aromatase inhibitor use   5. Osteoporosis, unspecified osteoporosis type, unspecified  pathological fracture presence   6. History of DVT (deep vein thrombosis)    history of left DICS.  S/p lumpectomy and RT.  July 2020 bilateral diagnostic mammogram was independently reviewed and discussed with patient.  Negative study.  Continue annual mammogram surveillance. Continue letrozole.  We discussed about duration of total of 5 years.  #Osteoporosis in the context of chronic aromatase use. I discussed with patient about the rationale of starting bisphosphonate treatments. Side effects of bisphosphonate including muscle cramps, joint pain, osteonecrosis of the jaw or other bone, electrolyte imbalance were discussed with patient.  Recommend patient to obtain dental clearance from dentist.  #Port-A-Cath in place, continue port flush.  Every 6 to 8 weeks. #History of DVT and PE, patient is on Pradaxa. #Chronic anemia and thrombocytopenia. Previous work-up includes normal LDH, folate and vitamin B12 level, negative hepatitis panel, HIV negative.  Negative SPEP-[facility].  Normal immature platelet fraction indicating underproduction. Labs are reviewed and discussed with patient.  Hemoglobin has normalized.  Hemoglobin is 12.1 today. Platelet count 111.  Recent CT abdomen pelvis with contrast  was independently reviewed by me.  Diverticula in descending colon and sigmoid colon.  Stable position of the IVC filter.  Normal spleen. Chronic number cytopenia probably secondary to autoimmune disorder/RA/lupus.  #Chronic autoimmune disorders/rheumatoid arthritis/lupus: established care with Dr.Bock  She started on Leflunomide 20mg  daily since March 2020.  Also has been on Plaquenil 200mg    Orders Placed This Encounter  Procedures   CBC with Differential/Platelet    Standing Status:   Future    Standing Expiration Date:   05/05/2020   Comprehensive metabolic panel    Standing Status:   Future    Standing Expiration Date:   05/05/2020    All questions were answered. The patient  knows to call the clinic with any problems questions or concerns.  Return of visit: 3 months.    Earlie Server, MD, PhD Hematology Oncology [facility] Pager- [phone] 07/19/2018

## 2019-05-07 NOTE — Therapy (Signed)
[place] MAIN [facility] SERVICES [address] [place], [place], [address] Phone: [phone]   Fax:  [phone]  Occupational Therapy Treatment Note and Progress Report  Patient Details  Name: Peityn Payton MRN: [id number] Date of Birth: [birth year] Referring Provider (OT): Marlowe Sax, MD   Encounter Date: 05/07/2019  OT End of Session - 05/07/19 1635    Visit Number  20    Number of Visits  36    Date for OT Re-Evaluation  05/11/19    OT Start Time  5537    OT Stop Time  1205    OT Time Calculation (min)  60 min    Activity Tolerance  Patient tolerated treatment well;No increased pain    Behavior During Therapy  WFL for tasks assessed/performed       Past Medical History:  Diagnosis Date  . Asthma   . Cancer (HCC)    Breast Left, DCIS   . Collagen vascular disease ([place])   . COPD (chronic obstructive pulmonary disease) ([place])   . Diverticulitis   . Fibromyalgia   . GERD (gastroesophageal reflux disease)   . Hearing impaired person, left   . IBS (irritable bowel syndrome)   . Lupus ([place])   . Mitral valve disorder   . MS (multiple sclerosis) ([place])   . Neuromuscular disorder ([place])   . Osteoarthritis   . Personal history of chemotherapy   . Personal history of radiation therapy 2017   left   . Rheumatoid arthritis ([place])   . Seizures ([place])     Past Surgical History:  Procedure Laterality Date  . ABDOMINAL HYSTERECTOMY    . APPENDECTOMY    . BREAST EXCISIONAL BIOPSY Left 2017   lumpectomy with radation  . BREAST EXCISIONAL BIOPSY Left 2004   neg surgical bx   . BREAST SURGERY    . CHOLECYSTECTOMY    . PATENT DUCTUS ARTERIOUS REPAIR    . RECONSTRUCTION TENDON PULLEY HAND Left   . TOTAL HIP ARTHROPLASTY Right     There were no vitals filed for this visit.  Subjective Assessment - 05/07/19 1632    Subjective   Ms Gardenhire presents for OT visit 20//36 to address BLE lipo-lymphedema. Pt presents with compression  wraps in place and brings liners, custom cover and anklets for custom Circaid, adjustable, velcro style compression garments to clinic. The custom garment was apparently shipped to vendor by mistake and is on it's way to patient todaY VIA fEDeX.    Pertinent History  Complex medical history includes several systemic, inflammatory conditions, including lupus, OA, RA, fibromyalgia. Complex medical history also includes additional contributing conditions including obesity,  hx recurrent DVT and single PE, hx LE cellulitis, asthma and COPD , Hx L breast Ca w SNLB, (Stage 0/ sub clinical LUE/LUQ lymphedema)  partial mastectomy and XRT (2017), MS, and R THA 2009.    Limitations  chronic B leg swelling and associated pain, difficulty walking, impaired transfers and functional mobility, decreased balance,, decreaased joint AROM 2/2 sweling at hips, knees ankles and toes ( difficulty bending to reach feet to perform LB bathing, skin and nail care,  and LB dressing) impaired activity tolerance  for activities requiring standing and/or walking > 20 minutes, (shopping, home management chores, cooking). Impaired body image. Impaired social participation    Repetition  Increases Symptoms    Special Tests  + Stemmer sign B feet    Pain Onset  Other (comment)   chronic  OT Treatments/Exercises (OP) - 05/07/19 0001      ADLs   ADL Education Given  Yes      Manual Therapy   Manual Therapy  Edema management    Manual therapy comments  moist hot pack to R hip during Rx to limit hip pain and improve sitting tolerance during MLD    Manual Lymphatic Drainage (MLD)  LLE MLD as establ;ished    Compression Bandaging  LLE multilayer,  gradient  compression wraps from ankle to below knee as established             OT Education - 05/07/19 1635    Education Details  Continued skilled Pt/caregiver education  And LE ADL training throughout visit for lymphedema self care/ home program,  including compression wrapping, compression garment and device wear/care, lymphatic pumping ther ex, simple self-MLD, and skin care. Discussed progress towards goals.    Person(s) Educated  Patient    Methods  Explanation;Demonstration;Handout    Comprehension  Verbalized understanding;Returned demonstration;Need further instruction          OT Long Term Goals - 05/07/19 1636      OT LONG TERM GOAL #1   Title  Pt will demonstrate modified independance with lymphedema (LE) precautions and prevention strategies by identifying 6  items from list provided in LE Workbook  by 4th OT Rx visit to limit LE progression.    Baseline  Max A MET 02/23/19    Time  4   4th OT rx visit   Period  Days    Status  Achieved      OT LONG TERM GOAL #2   Title  Pt will be able to apply  knee length, multi-layer, short stretch compression wraps using correct gradient techniques with mod CG assistance  to achieve optimal limb volume reduction, and to return affected limb , as closely as possible, to premorbid size and shape, to limit infection risk and to improve tissue integrity.    Baseline  dependent 02/23/19 GOAL MET    Time  4    Period  Days    Status  Achieved      OT LONG TERM GOAL #3   Title  Pt to achieve at least 10% limb volume reduction below knees bilaterally  during Intensive Phase CDT  to improve tissue elasticity needed for optimal lymphatic function and joint AROM, , to return limbs to premorbid size and shape, to improve functional transfers and mobility, and to decrease infection risk.    Baseline  Max A 03/21/19: partially achieved with 13.93% volume reduction below the knee on L. RLE CDT  will commence once LLE garments are fitted . They are ordered.    Time  12    Period  Weeks    Status  Partially Met      OT LONG TERM GOAL #4   Title  Pt will achieve  100% compliance with daily LE self-care program with mod  caregiver assistance throughout Intensive Phase CDT, including daily  skin  care, lymphatic pumping therex, simple self-MLD and prescribe compression to ensure optimal limb volume reduction, to reduce infection risk and to limit LE progression for optimal functional performance in all occupational domains.    Baseline  Max A    Time  12    Period  Weeks    Status  Achieved      OT LONG TERM GOAL #5   Title  Pt will demonstrate modified independence using assistive devices and extra  time to don and doff properly fitting, custom compression garments/devices for optimal LE self-management over time.    Baseline  Max A    Time  12    Period  Weeks    Status  On-going      OT LONG TERM GOAL #6   Title  Pt will retain clinical BLE limb volume reductions over 6 months following intensive phase CDT ( management phase) with mod A from CG and by Pt consistently following all home program components daily to limit LE progression and further functional decline.    Baseline  Dependent    Time  6    Period  Months    Status  On-going            Plan - 05/07/19 1638    Clinical Impression Statement  Pt continues to manage LE well during visit intervals for knee length compression wrapping and skin care. She has not yet received custom compression nkee highs, but they are expected to be delivered in a day or so since she has received accessory kit including liners, anklets and cover. Pt is awaiting insurance  funding approval for a  proximal to distal, sequential pneumatic compression device (pump), as she is unable to reach feet to perform and back pain limits performance of simple self-MLD. Skin is in excellent condition and Pt has met and exceeded LLE limb volume reduction goal. She as remained compliant with all self care and is looking forward to shifting CDT to RLE. Despite not directly treatinig the RLE, it too has responded to CDT as evidenced by visible limvb volume reduction and reduction in limb pain since commencing OT. Cont as per POC 2 x weekly. Reduce treatment  frequency once RLE custom compression is fitted correctly.    OT Occupational Profile and History  Comprehensive Assessment- Review of records and extensive additional review of physical, cognitive, psychosocial history related to current functional performance    Occupational performance deficits (Please refer to evaluation for details):  ADL's;IADL's;Social Participation;Leisure;Work;Play    Body Structure / Function / Physical Skills  ADL;Decreased knowledge of precautions;Flexibility;ROM;Balance;Decreased knowledge of use of DME;Mobility;Edema;Skin integrity;Obesity;Strength;IADL    Psychosocial Skills  Coping Strategies;Habits    Rehab Potential  Good    Clinical Decision Making  Multiple treatment options, significant modification of task necessary    Comorbidities Affecting Occupational Performance:  Presence of comorbidities impacting occupational performance    Comorbidities impacting occupational performance description:  see SUBJECTIVE    Modification or Assistance to Complete Evaluation   Min-Moderate modification of tasks or assist with assess necessary to complete eval    OT Frequency  3x / week    OT Duration  12 weeks   and PRN to address bilateral involvement-one leg at a time to limit falls risk   OT Treatment/Interventions  Self-care/ADL training;Therapeutic exercise;Manual lymph drainage;Coping strategies training;Other (comment);Patient/family education;Compression bandaging;Therapeutic activities;Manual Therapy;DME and/or AE instruction    Plan  CDT: manua lymphatic drainage (MLD, skin care, lymphatic pumping therex, gradient compression wraps and compression garments / devices)    Consulted and Agree with Plan of Care  Patient       Patient will benefit from skilled therapeutic intervention in order to improve the following deficits and impairments:   Body Structure / Function / Physical Skills: ADL, Decreased knowledge of precautions, Flexibility, ROM, Balance, Decreased  knowledge of use of DME, Mobility, Edema, Skin integrity, Obesity, Strength, IADL   Psychosocial Skills: Coping Strategies, Habits   Visit Diagnosis: Lymphedema, not  elsewhere classified    Problem List Patient Active Problem List   Diagnosis Date Noted  . Thrombocytopenia ([place]) 01/23/2019  . Leukopenia 01/23/2019  . Lymphedema of both lower extremities 01/16/2019  . Polyarthralgia 10/14/2018  . Headache disorder 10/14/2018  . Chronic daily headache 10/11/2018  . COPD (chronic obstructive pulmonary disease) ([place]) 10/11/2018  . Thoracic aortic atherosclerosis ([place]) 10/11/2018  . Multiple episodes of deep venous thrombosis ([place]) 09/17/2018  . IBS (irritable bowel syndrome) 09/17/2018  . Healthcare maintenance 09/17/2018  . Primary osteoarthritis of right hip 09/03/2018  . Presence of right hip implant 09/03/2018  . Morbid obesity with BMI of 45.0-49.9, adult ([place]) 08/23/2018  . Shortness of breath 08/22/2018  . History of ductal carcinoma in situ (DCIS) of breast 07/20/2018  . Aromatase inhibitor use 07/20/2018  . Abnormal findings on auscultation 07/16/2018  . Encounter for long-term (current) use of high-risk medication 07/16/2018  . Fibromyalgia 07/16/2018  . Other forms of systemic lupus erythematosus ([place]) 07/16/2018  . Other osteoporosis without current pathological fracture 07/16/2018  . Seronegative arthritis 07/16/2018    Andrey Spearman, MS, OTR/L, [facility] 05/07/19 4:46 PM  [place] MAIN [facility] SERVICES [address] [place], [place], [address] Phone: [phone]   Fax:  [phone]  Name: Betsy Rosello MRN: [id number] Date of Birth: 1961/06/04

## 2019-05-08 ENCOUNTER — Encounter: Payer: Self-pay | Admitting: Oncology

## 2019-05-08 ENCOUNTER — Other Ambulatory Visit: Payer: Self-pay

## 2019-05-08 ENCOUNTER — Ambulatory Visit: Payer: Medicare Other | Attending: Internal Medicine | Admitting: Occupational Therapy

## 2019-05-08 DIAGNOSIS — I890 Lymphedema, not elsewhere classified: Secondary | ICD-10-CM | POA: Insufficient documentation

## 2019-05-08 NOTE — Therapy (Signed)
[place] MAIN [facility] SERVICES [address] [place], [place], [address] Phone: [phone]   Fax:  [phone]  Occupational Therapy Treatment  Patient Details  Name: Victoria [name] MRN: [id number] Date of Birth: Mar 23, 1961 Referring Provider (OT): Marlowe Sax, MD   Encounter Date: 05/08/2019  OT End of Session - 05/08/19 1120    Visit Number  21    Number of Visits  36    Date for OT Re-Evaluation  05/11/19    OT Start Time  1115    OT Stop Time  1210    OT Time Calculation (min)  55 min    Activity Tolerance  Patient tolerated treatment well;No increased pain    Behavior During Therapy  WFL for tasks assessed/performed       Past Medical History:  Diagnosis Date  . Asthma   . Cancer (HCC)    Breast Left, DCIS   . Collagen vascular disease ([place])   . COPD (chronic obstructive pulmonary disease) ([place])   . Diverticulitis   . Fibromyalgia   . GERD (gastroesophageal reflux disease)   . Hearing impaired person, left   . IBS (irritable bowel syndrome)   . Lupus ([place])   . Mitral valve disorder   . MS (multiple sclerosis) ([place])   . Neuromuscular disorder ([place])   . Osteoarthritis   . Personal history of chemotherapy   . Personal history of radiation therapy 2017   left   . Rheumatoid arthritis ([place])   . Seizures ([place])     Past Surgical History:  Procedure Laterality Date  . ABDOMINAL HYSTERECTOMY    . APPENDECTOMY    . BREAST EXCISIONAL BIOPSY Left 2017   lumpectomy with radation  . BREAST EXCISIONAL BIOPSY Left 2004   neg surgical bx   . BREAST SURGERY    . CHOLECYSTECTOMY    . PATENT DUCTUS ARTERIOUS REPAIR    . RECONSTRUCTION TENDON PULLEY HAND Left   . TOTAL HIP ARTHROPLASTY Right     There were no vitals filed for this visit.  Subjective Assessment - 05/08/19 1118    Subjective   Ms Harrell presents for OT visit 21//36 to address BLE lipo-lymphedema. Pt presents with compression wraps in place.  Pt has no  new complaints.    Pertinent History  Complex medical history includes several systemic, inflammatory conditions, including lupus, OA, RA, fibromyalgia. Complex medical history also includes additional contributing conditions including obesity,  hx recurrent DVT and single PE, hx LE cellulitis, asthma and COPD , Hx L breast Ca w SNLB, (Stage 0/ sub clinical LUE/LUQ lymphedema)  partial mastectomy and XRT (2017), MS, and R THA 2009.    Limitations  chronic B leg swelling and associated pain, difficulty walking, impaired transfers and functional mobility, decreased balance,, decreaased joint AROM 2/2 sweling at hips, knees ankles and toes ( difficulty bending to reach feet to perform LB bathing, skin and nail care,  and LB dressing) impaired activity tolerance  for activities requiring standing and/or walking > 20 minutes, (shopping, home management chores, cooking). Impaired body image. Impaired social participation    Repetition  Increases Symptoms    Special Tests  + Stemmer sign B feet    Pain Onset  Other (comment)   chronic                  OT Treatments/Exercises (OP) - 05/08/19 0001      ADLs   ADL Education Given  Yes  Manual Therapy   Manual Therapy  Edema management;Manual Lymphatic Drainage (MLD);Compression Bandaging    Manual Lymphatic Drainage (MLD)  LLE MLD as establ;ished    Compression Bandaging  LLE multilayer,  gradient  compression wraps from ankle to below knee as established             OT Education - 05/08/19 1119    Education Details  Continued skilled Pt/caregiver education  And LE ADL training throughout visit for lymphedema self care/ home program, including compression wrapping, compression garment and device wear/care, lymphatic pumping ther ex, simple self-MLD, and skin care. Discussed progress towards goals.    Person(s) Educated  Patient    Methods  Explanation;Demonstration;Handout    Comprehension  Verbalized understanding;Returned  demonstration;Need further instruction          OT Long Term Goals - 05/07/19 1636      OT LONG TERM GOAL #1   Title  Pt will demonstrate modified independance with lymphedema (LE) precautions and prevention strategies by identifying 6  items from list provided in LE Workbook  by 4th OT Rx visit to limit LE progression.    Baseline  Max A MET 02/23/19    Time  4   4th OT rx visit   Period  Days    Status  Achieved      OT LONG TERM GOAL #2   Title  Pt will be able to apply  knee length, multi-layer, short stretch compression wraps using correct gradient techniques with mod CG assistance  to achieve optimal limb volume reduction, and to return affected limb , as closely as possible, to premorbid size and shape, to limit infection risk and to improve tissue integrity.    Baseline  dependent 02/23/19 GOAL MET    Time  4    Period  Days    Status  Achieved      OT LONG TERM GOAL #3   Title  Pt to achieve at least 10% limb volume reduction below knees bilaterally  during Intensive Phase CDT  to improve tissue elasticity needed for optimal lymphatic function and joint AROM, , to return limbs to premorbid size and shape, to improve functional transfers and mobility, and to decrease infection risk.    Baseline  Max A 03/21/19: partially achieved with 13.93% volume reduction below the knee on L. RLE CDT  will commence once LLE garments are fitted . They are ordered.    Time  12    Period  Weeks    Status  Partially Met      OT LONG TERM GOAL #4   Title  Pt will achieve  100% compliance with daily LE self-care program with mod  caregiver assistance throughout Intensive Phase CDT, including daily  skin care, lymphatic pumping therex, simple self-MLD and prescribe compression to ensure optimal limb volume reduction, to reduce infection risk and to limit LE progression for optimal functional performance in all occupational domains.    Baseline  Max A    Time  12    Period  Weeks    Status   Achieved      OT LONG TERM GOAL #5   Title  Pt will demonstrate modified independence using assistive devices and extra time to don and doff properly fitting, custom compression garments/devices for optimal LE self-management over time.    Baseline  Max A    Time  12    Period  Weeks    Status  On-going  OT LONG TERM GOAL #6   Title  Pt will retain clinical BLE limb volume reductions over 6 months following intensive phase CDT ( management phase) with mod A from CG and by Pt consistently following all home program components daily to limit LE progression and further functional decline.    Baseline  Dependent    Time  6    Period  Months    Status  On-going            Plan - 05/08/19 1254    Clinical Impression Statement  Pt tolerated manual therapy and compressionw rapping without difficulty today. Pt  continues to retain clinical gains achieved thus far while awaiting delivery of custom LLE compression garment . Cont as per POC.    OT Occupational Profile and History  Comprehensive Assessment- Review of records and extensive additional review of physical, cognitive, psychosocial history related to current functional performance    Occupational performance deficits (Please refer to evaluation for details):  ADL's;IADL's;Social Participation;Leisure;Work;Play    Body Structure / Function / Physical Skills  ADL;Decreased knowledge of precautions;Flexibility;ROM;Balance;Decreased knowledge of use of DME;Mobility;Edema;Skin integrity;Obesity;Strength;IADL    Psychosocial Skills  Coping Strategies;Habits    Rehab Potential  Good    Clinical Decision Making  Multiple treatment options, significant modification of task necessary    Comorbidities Affecting Occupational Performance:  Presence of comorbidities impacting occupational performance    Comorbidities impacting occupational performance description:  see SUBJECTIVE    Modification or Assistance to Complete Evaluation    Min-Moderate modification of tasks or assist with assess necessary to complete eval    OT Frequency  3x / week    OT Duration  12 weeks   and PRN to address bilateral involvement-one leg at a time to limit falls risk   OT Treatment/Interventions  Self-care/ADL training;Therapeutic exercise;Manual lymph drainage;Coping strategies training;Other (comment);Patient/family education;Compression bandaging;Therapeutic activities;Manual Therapy;DME and/or AE instruction    Plan  CDT: manua lymphatic drainage (MLD, skin care, lymphatic pumping therex, gradient compression wraps and compression garments / devices)    Consulted and Agree with Plan of Care  Patient       Patient will benefit from skilled therapeutic intervention in order to improve the following deficits and impairments:   Body Structure / Function / Physical Skills: ADL, Decreased knowledge of precautions, Flexibility, ROM, Balance, Decreased knowledge of use of DME, Mobility, Edema, Skin integrity, Obesity, Strength, IADL   Psychosocial Skills: Coping Strategies, Habits   Visit Diagnosis: Lymphedema, not elsewhere classified    Problem List Patient Active Problem List   Diagnosis Date Noted  . Thrombocytopenia ([place]) 01/23/2019  . Leukopenia 01/23/2019  . Lymphedema of both lower extremities 01/16/2019  . Polyarthralgia 10/14/2018  . Headache disorder 10/14/2018  . Chronic daily headache 10/11/2018  . COPD (chronic obstructive pulmonary disease) ([place]) 10/11/2018  . Thoracic aortic atherosclerosis ([place]) 10/11/2018  . Multiple episodes of deep venous thrombosis ([place]) 09/17/2018  . IBS (irritable bowel syndrome) 09/17/2018  . Healthcare maintenance 09/17/2018  . Primary osteoarthritis of right hip 09/03/2018  . Presence of right hip implant 09/03/2018  . Morbid obesity with BMI of 45.0-49.9, adult ([place]) 08/23/2018  . Shortness of breath 08/22/2018  . History of ductal carcinoma in situ (DCIS) of breast 07/20/2018  .  Aromatase inhibitor use 07/20/2018  . Abnormal findings on auscultation 07/16/2018  . Encounter for long-term (current) use of high-risk medication 07/16/2018  . Fibromyalgia 07/16/2018  . Other forms of systemic lupus erythematosus ([place]) 07/16/2018  . Other osteoporosis without  current pathological fracture 07/16/2018  . Seronegative arthritis 07/16/2018   Andrey Spearman, MS, OTR/L, [facility] 05/08/19 12:57 PM   [place] MAIN [facility] SERVICES [address] [place], [place], [address] Phone: [phone]   Fax:  [phone]  Name: Victoria [name] MRN: [id number] Date of Birth: [birth year]

## 2019-05-09 ENCOUNTER — Encounter: Payer: Medicare Other | Admitting: Occupational Therapy

## 2019-05-12 ENCOUNTER — Ambulatory Visit: Payer: Medicare Other | Admitting: Occupational Therapy

## 2019-05-12 ENCOUNTER — Encounter: Payer: Self-pay | Admitting: Oncology

## 2019-05-12 ENCOUNTER — Other Ambulatory Visit: Payer: Self-pay

## 2019-05-12 ENCOUNTER — Other Ambulatory Visit: Payer: Self-pay | Admitting: Oncology

## 2019-05-12 DIAGNOSIS — I890 Lymphedema, not elsewhere classified: Secondary | ICD-10-CM

## 2019-05-12 DIAGNOSIS — M810 Age-related osteoporosis without current pathological fracture: Secondary | ICD-10-CM

## 2019-05-12 HISTORY — DX: Age-related osteoporosis without current pathological fracture: M81.0

## 2019-05-13 NOTE — Therapy (Signed)
[place] MAIN [facility] SERVICES [address] [place], [place], [address] Phone: [phone]   Fax:  [phone]  Occupational Therapy Treatment  Patient Details  Name: Victoria [name] MRN: [id number] Date of Birth: 11-Feb-1961 Referring Provider (OT): Victoria Sax, MD   Encounter Date: 05/12/2019  OT End of Session - 05/12/19 1147    Visit Number  22    Number of Visits  36    Date for OT Re-Evaluation  08/11/19    OT Start Time  1005    OT Stop Time  1105    OT Time Calculation (min)  60 min    Activity Tolerance  Patient tolerated treatment well;No increased pain    Behavior During Therapy  WFL for tasks assessed/performed       Past Medical History:  Diagnosis Date  . Asthma   . Cancer (HCC)    Breast Left, DCIS   . Collagen vascular disease ([place])   . COPD (chronic obstructive pulmonary disease) ([place])   . Diverticulitis   . Fibromyalgia   . GERD (gastroesophageal reflux disease)   . Hearing impaired person, left   . IBS (irritable bowel syndrome)   . Lupus ([place])   . Mitral valve disorder   . Victoria (multiple sclerosis) ([place])   . Neuromuscular disorder ([place])   . Osteoarthritis   . Osteoporosis 05/12/2019  . Personal history of chemotherapy   . Personal history of radiation therapy 2017   left   . Rheumatoid arthritis ([place])   . Seizures ([place])     Past Surgical History:  Procedure Laterality Date  . ABDOMINAL HYSTERECTOMY    . APPENDECTOMY    . BREAST EXCISIONAL BIOPSY Left 2017   lumpectomy with radation  . BREAST EXCISIONAL BIOPSY Left 2004   neg surgical bx   . BREAST SURGERY    . CHOLECYSTECTOMY    . PATENT DUCTUS ARTERIOUS REPAIR    . RECONSTRUCTION TENDON PULLEY HAND Left   . TOTAL HIP ARTHROPLASTY Right     There were no vitals filed for this visit.  Subjective Assessment - 05/12/19 1141    Subjective   Victoria [name] presents for OT visit 22//36 to address BLE lipo-lymphedema. Pt brings custom CircAid  adjustable compression wrap to clinic for fitting.    Pertinent History  Complex medical history includes several systemic, inflammatory conditions, including lupus, OA, RA, fibromyalgia. Complex medical history also includes additional contributing conditions including obesity,  hx recurrent DVT and single PE, hx LE cellulitis, asthma and COPD , Hx L breast Ca w SNLB, (Stage 0/ sub clinical LUE/LUQ lymphedema)  partial mastectomy and XRT (2017), Victoria, and R THA 2009.    Limitations  chronic B leg swelling and associated pain, difficulty walking, impaired transfers and functional mobility, decreased balance,, decreaased joint AROM 2/2 sweling at hips, knees ankles and toes ( difficulty bending to reach feet to perform LB bathing, skin and nail care,  and LB dressing) impaired activity tolerance  for activities requiring standing and/or walking > 20 minutes, (shopping, home management chores, cooking). Impaired body image. Impaired social participation    Repetition  Increases Symptoms    Special Tests  + Stemmer sign B feet    Pain Onset  Other (comment)   chronic                  OT Treatments/Exercises (OP) - 05/13/19 0001      ADLs   ADL Education Given  Yes  Manual Therapy   Manual Therapy  Edema management;Manual Lymphatic Drainage (MLD)    Manual Lymphatic Drainage (MLD)  LLE MLD as establ;ished    Compression Bandaging  Custom, knee length, adjustable, Velcro,  CircAid compression wrap for daytime use- 20-30 mmHg.             OT Education - 05/12/19 1145    Education Details  Skilled Pt edu for donning , doffing, wear and care of custom, adjustable compression wraps for day time use. Good return. Modified independent with donning/ doffing (extra time)    Person(s) Educated  Patient    Methods  Explanation;Demonstration;Handout    Comprehension  Verbalized understanding;Returned demonstration;Need further instruction          OT Long Term Goals - 05/12/19  1148      OT LONG TERM GOAL #1   Title  Pt will demonstrate modified independance with lymphedema (LE) precautions and prevention strategies by identifying 6  items from list provided in LE Workbook  by 4th OT Rx visit to limit LE progression.    Baseline  Max A MET 02/23/19    Time  4   4th OT rx visit   Period  Days    Status  Achieved      OT LONG TERM GOAL #2   Title  Pt will be able to apply  knee length, multi-layer, short stretch compression wraps using correct gradient techniques with mod CG assistance  to achieve optimal limb volume reduction, and to return affected limb , as closely as possible, to premorbid size and shape, to limit infection risk and to improve tissue integrity.    Baseline  dependent 02/23/19 GOAL MET    Time  4    Period  Days    Status  Achieved      OT LONG TERM GOAL #3   Title  Pt to achieve at least 10% limb volume reduction below knees bilaterally  during Intensive Phase CDT  to improve tissue elasticity needed for optimal lymphatic function and joint AROM, , to return limbs to premorbid size and shape, to improve functional transfers and mobility, and to decrease infection risk.    Baseline  Max A 03/21/19: partially achieved with 13.93% volume reduction below the knee on L. RLE CDT  will commence once LLE garments are fitted . They are ordered.    Time  12    Period  Weeks    Status  Partially Met      OT LONG TERM GOAL #4   Title  Pt will achieve  100% compliance with daily LE self-care program with mod  caregiver assistance throughout Intensive Phase CDT, including daily  skin care, lymphatic pumping therex, simple self-MLD and prescribe compression to ensure optimal limb volume reduction, to reduce infection risk and to limit LE progression for optimal functional performance in all occupational domains.    Baseline  Max A    Time  12    Period  Weeks    Status  Achieved      OT LONG TERM GOAL #5   Title  Pt will demonstrate modified independence  using assistive devices and extra time to don and doff properly fitting, custom compression garments/devices for optimal LE self-management over time.    Baseline  Max A    Time  12    Period  Weeks    Status  Achieved      OT LONG TERM GOAL #6   Title  Pt  will retain clinical BLE limb volume reductions over 6 months following intensive phase CDT ( management phase) with mod A from CG and by Pt consistently following all home program components daily to limit LE progression and further functional decline.    Baseline  Dependent    Time  6    Period  Months    Status  On-going              Patient will benefit from skilled therapeutic intervention in order to improve the following deficits and impairments:           Visit Diagnosis: Lymphedema, not elsewhere classified    Problem List Patient Active Problem List   Diagnosis Date Noted  . Osteoporosis 05/12/2019  . Thrombocytopenia ([place]) 01/23/2019  . Leukopenia 01/23/2019  . Lymphedema of both lower extremities 01/16/2019  . Polyarthralgia 10/14/2018  . Headache disorder 10/14/2018  . Chronic daily headache 10/11/2018  . COPD (chronic obstructive pulmonary disease) ([place]) 10/11/2018  . Thoracic aortic atherosclerosis ([place]) 10/11/2018  . Multiple episodes of deep venous thrombosis ([place]) 09/17/2018  . IBS (irritable bowel syndrome) 09/17/2018  . Healthcare maintenance 09/17/2018  . Primary osteoarthritis of right hip 09/03/2018  . Presence of right hip implant 09/03/2018  . Morbid obesity with BMI of 45.0-49.9, adult ([place]) 08/23/2018  . Shortness of breath 08/22/2018  . History of ductal carcinoma in situ (DCIS) of breast 07/20/2018  . Aromatase inhibitor use 07/20/2018  . Abnormal findings on auscultation 07/16/2018  . Encounter for long-term (current) use of high-risk medication 07/16/2018  . Fibromyalgia 07/16/2018  . Other forms of systemic lupus erythematosus ([place]) 07/16/2018  . Other osteoporosis without  current pathological fracture 07/16/2018  . Seronegative arthritis 07/16/2018    Andrey Spearman, [place], OTR/L, [facility] 05/13/19 11:50 AM  [place] MAIN [facility] SERVICES [address] [place], [place], [address] Phone: [phone]   Fax:  [phone]  Name: Victoria [name] MRN: [id number] Date of Birth: [birth year]

## 2019-05-13 NOTE — Patient Instructions (Signed)
Circaid wraps to be worn during waking hours. Wash by hand and lay flat to dry.

## 2019-05-14 ENCOUNTER — Other Ambulatory Visit: Payer: Self-pay

## 2019-05-14 ENCOUNTER — Ambulatory Visit: Payer: Medicare Other | Admitting: Occupational Therapy

## 2019-05-14 DIAGNOSIS — I890 Lymphedema, not elsewhere classified: Secondary | ICD-10-CM

## 2019-05-14 NOTE — Therapy (Signed)
[facility] [facility] MAIN REHAB SERVICES [address] [place], [place], [address] Phone: [phone]   Fax:  [phone]  Occupational Therapy Treatment  Patient Details  Name: Victoria [name] MRN: [id number] Date of Birth: 11/23/1960 Referring Provider (OT): Christele Behalal-Bock, MD   Encounter Date: 05/14/2019  OT End of Session - 05/14/19 1504    Visit Number  23    Number of Visits  36    Date for OT Re-Evaluation  08/11/19    OT Start Time  1010    OT Stop Time  1110    OT Time Calculation (min)  60 min    Activity Tolerance  Patient tolerated treatment well;No increased pain    Behavior During Therapy  WFL for tasks assessed/performed       Past Medical History:  Diagnosis Date  . Asthma   . Cancer (HCC)    Breast Left, DCIS   . Collagen vascular disease (HCC)   . COPD (chronic obstructive pulmonary disease) (HCC)   . Diverticulitis   . Fibromyalgia   . GERD (gastroesophageal reflux disease)   . Hearing impaired person, left   . IBS (irritable bowel syndrome)   . Lupus (HCC)   . Mitral valve disorder   . MS (multiple sclerosis) (HCC)   . Neuromuscular disorder (HCC)   . Osteoarthritis   . Osteoporosis 05/12/2019  . Personal history of chemotherapy   . Personal history of radiation therapy 2017   left   . Rheumatoid arthritis (HCC)   . Seizures (HCC)     Past Surgical History:  Procedure Laterality Date  . ABDOMINAL HYSTERECTOMY    . APPENDECTOMY    . BREAST EXCISIONAL BIOPSY Left 2017   lumpectomy with radation  . BREAST EXCISIONAL BIOPSY Left 2004   neg surgical bx   . BREAST SURGERY    . CHOLECYSTECTOMY    . PATENT DUCTUS ARTERIOUS REPAIR    . RECONSTRUCTION TENDON PULLEY HAND Left   . TOTAL HIP ARTHROPLASTY Right     There were no vitals filed for this visit.  Subjective Assessment - 05/14/19 1501    Subjective   Ms Rademaker presents for OT visit 23//36 to address BLE lipo-lymphedema. Pt agrees with plan to  complete RLE compression garment mewasurements today.    Pertinent History  Complex medical history includes several systemic, inflammatory conditions, including lupus, OA, RA, fibromyalgia. Complex medical history also includes additional contributing conditions including obesity,  hx recurrent DVT and single PE, hx LE cellulitis, asthma and COPD , Hx L breast Ca w SNLB, (Stage 0/ sub clinical LUE/LUQ lymphedema)  partial mastectomy and XRT (2017), [place], and R THA 2009.    Limitations  chronic B leg swelling and associated pain, difficulty walking, impaired transfers and functional mobility, decreased balance,, decreaased joint AROM 2/2 sweling at hips, knees ankles and toes ( difficulty bending to reach feet to perform LB bathing, skin and nail care,  and LB dressing) impaired activity tolerance  for activities requiring standing and/or walking > 20 minutes, (shopping, home management chores, cooking). Impaired body image. Impaired social participation    Repetition  Increases Symptoms    Special Tests  + Stemmer sign B feet    Pain Onset  Other (comment)   chronic                  OT Treatments/Exercises (OP) - 05/14/19 0001      ADLs   ADL Education Given  Yes        Manual Therapy   Manual Therapy  Edema management;Manual Lymphatic Drainage (MLD)    Manual therapy comments  compression garment assessment    Edema Management  anatomical measurements for RLE cstom     comprtession garment    Manual Lymphatic Drainage (MLD)  RLE MLD as establ;ished    Compression Bandaging  RLE compression w5rap as established             OT Education - 05/14/19 1504    Education Details  Continued skilled Pt/caregiver education  And LE ADL training throughout visit for lymphedema self care/ home program, including compression wrapping, compression garment and device wear/care, lymphatic pumping ther ex, simple self-MLD, and skin care. Discussed progress towards goals.    Person(s) Educated   Patient    Methods  Explanation;Demonstration;Handout    Comprehension  Verbalized understanding;Returned demonstration;Need further instruction          OT Long Term Goals - 05/12/19 1148      OT LONG TERM GOAL #1   Title  Pt will demonstrate modified independance with lymphedema (LE) precautions and prevention strategies by identifying 6  items from list provided in LE Workbook  by 4th OT Rx visit to limit LE progression.    Baseline  Max A MET 02/23/19    Time  4   4th OT rx visit   Period  Days    Status  Achieved      OT LONG TERM GOAL #2   Title  Pt will be able to apply  knee length, multi-layer, short stretch compression wraps using correct gradient techniques with mod CG assistance  to achieve optimal limb volume reduction, and to return affected limb , as closely as possible, to premorbid size and shape, to limit infection risk and to improve tissue integrity.    Baseline  dependent 02/23/19 GOAL MET    Time  4    Period  Days    Status  Achieved      OT LONG TERM GOAL #3   Title  Pt to achieve at least 10% limb volume reduction below knees bilaterally  during Intensive Phase CDT  to improve tissue elasticity needed for optimal lymphatic function and joint AROM, , to return limbs to premorbid size and shape, to improve functional transfers and mobility, and to decrease infection risk.    Baseline  Max A 03/21/19: partially achieved with 13.93% volume reduction below the knee on L. RLE CDT  will commence once LLE garments are fitted . They are ordered.    Time  12    Period  Weeks    Status  Partially Met      OT LONG TERM GOAL #4   Title  Pt will achieve  100% compliance with daily LE self-care program with mod  caregiver assistance throughout Intensive Phase CDT, including daily  skin care, lymphatic pumping therex, simple self-MLD and prescribe compression to ensure optimal limb volume reduction, to reduce infection risk and to limit LE progression for optimal functional  performance in all occupational domains.    Baseline  Max A    Time  12    Period  Weeks    Status  Achieved      OT LONG TERM GOAL #5   Title  Pt will demonstrate modified independence using assistive devices and extra time to don and doff properly fitting, custom compression garments/devices for optimal LE self-management over time.    Baseline  Max A    Time  12    Period  Weeks    Status  Achieved      OT LONG TERM GOAL #6   Title  Pt will retain clinical BLE limb volume reductions over 6 months following intensive phase CDT ( management phase) with mod A from CG and by Pt consistently following all home program components daily to limit LE progression and further functional decline.    Baseline  Dependent    Time  6    Period  Months    Status  On-going            Plan - 05/14/19 1505    Clinical Impression Statement  RLE limb volume has reduced with treatment solely to LLE only to date.. Completed anatomical measurements for custom, RLE CircAid, knee length, adjustable , short stretch leg wrap to complete pair with LLE garment. Pt states she is pleased with LLE CircAid. She reports it's comfortable, fits well and she was able to doff and don it with modified independence, including "a little extra time ". Measurements faxed to DME vendor after session. Cont as per POC.       Patient will benefit from skilled therapeutic intervention in order to improve the following deficits and impairments:           Visit Diagnosis: Lymphedema, not elsewhere classified    Problem List Patient Active Problem List   Diagnosis Date Noted  . Osteoporosis 05/12/2019  . Thrombocytopenia (HCC) 01/23/2019  . Leukopenia 01/23/2019  . Lymphedema of both lower extremities 01/16/2019  . Polyarthralgia 10/14/2018  . Headache disorder 10/14/2018  . Chronic daily headache 10/11/2018  . COPD (chronic obstructive pulmonary disease) (HCC) 10/11/2018  . Thoracic aortic atherosclerosis (HCC)  10/11/2018  . Multiple episodes of deep venous thrombosis (HCC) 09/17/2018  . IBS (irritable bowel syndrome) 09/17/2018  . Healthcare maintenance 09/17/2018  . Primary osteoarthritis of right hip 09/03/2018  . Presence of right hip implant 09/03/2018  . Morbid obesity with BMI of 45.0-49.9, adult (HCC) 08/23/2018  . Shortness of breath 08/22/2018  . History of ductal carcinoma in situ (DCIS) of breast 07/20/2018  . Aromatase inhibitor use 07/20/2018  . Abnormal findings on auscultation 07/16/2018  . Encounter for long-term (current) use of high-risk medication 07/16/2018  . Fibromyalgia 07/16/2018  . Other forms of systemic lupus erythematosus (HCC) 07/16/2018  . Other osteoporosis without current pathological fracture 07/16/2018  . Seronegative arthritis 07/16/2018    Theresa Gilliam, MS, OTR/L, CLT-LANA 05/14/19 3:09 PM   [facility] MAIN REHAB SERVICES [address] [place], [place], [address] Phone: [phone]   Fax:  [phone]  Name: Victoria [name] MRN: [id number] Date of Birth: 01/19/1961 

## 2019-05-16 ENCOUNTER — Other Ambulatory Visit: Payer: Self-pay

## 2019-05-16 ENCOUNTER — Inpatient Hospital Stay: Payer: Medicare Other | Attending: Oncology

## 2019-05-16 ENCOUNTER — Inpatient Hospital Stay: Payer: Medicare Other

## 2019-05-16 VITALS — BP 103/69 | HR 75 | Temp 96.6°F | Resp 18

## 2019-05-16 DIAGNOSIS — Z86000 Personal history of in-situ neoplasm of breast: Secondary | ICD-10-CM

## 2019-05-16 DIAGNOSIS — M818 Other osteoporosis without current pathological fracture: Secondary | ICD-10-CM | POA: Insufficient documentation

## 2019-05-16 DIAGNOSIS — D0512 Intraductal carcinoma in situ of left breast: Secondary | ICD-10-CM | POA: Insufficient documentation

## 2019-05-16 DIAGNOSIS — M810 Age-related osteoporosis without current pathological fracture: Secondary | ICD-10-CM

## 2019-05-16 LAB — CBC WITH DIFFERENTIAL/PLATELET
Abs Immature Granulocytes: 0.01 10*3/uL (ref 0.00–0.07)
Basophils Absolute: 0 10*3/uL (ref 0.0–0.1)
Basophils Relative: 1 %
Eosinophils Absolute: 0 10*3/uL (ref 0.0–0.5)
Eosinophils Relative: 0 %
HCT: 37.7 % (ref 36.0–46.0)
Hemoglobin: 12.3 g/dL (ref 12.0–15.0)
Immature Granulocytes: 0 %
Lymphocytes Relative: 21 %
Lymphs Abs: 0.8 10*3/uL (ref 0.7–4.0)
MCH: 29.6 pg (ref 26.0–34.0)
MCHC: 32.6 g/dL (ref 30.0–36.0)
MCV: 90.8 fL (ref 80.0–100.0)
Monocytes Absolute: 0.3 10*3/uL (ref 0.1–1.0)
Monocytes Relative: 7 %
Neutro Abs: 2.8 10*3/uL (ref 1.7–7.7)
Neutrophils Relative %: 71 %
Platelets: 114 10*3/uL — ABNORMAL LOW (ref 150–400)
RBC: 4.15 MIL/uL (ref 3.87–5.11)
RDW: 14.2 % (ref 11.5–15.5)
WBC: 4 10*3/uL (ref 4.0–10.5)
nRBC: 0 % (ref 0.0–0.2)

## 2019-05-16 LAB — COMPREHENSIVE METABOLIC PANEL
ALT: 24 U/L (ref 0–44)
AST: 23 U/L (ref 15–41)
Albumin: 4 g/dL (ref 3.5–5.0)
Alkaline Phosphatase: 70 U/L (ref 38–126)
Anion gap: 7 (ref 5–15)
BUN: 13 mg/dL (ref 6–20)
CO2: 23 mmol/L (ref 22–32)
Calcium: 9.3 mg/dL (ref 8.9–10.3)
Chloride: 111 mmol/L (ref 98–111)
Creatinine, Ser: 0.9 mg/dL (ref 0.44–1.00)
GFR calc Af Amer: >60 mL/min (ref >60)
GFR calc non Af Amer: >60 mL/min (ref >60)
Glucose, Bld: 106 mg/dL — ABNORMAL HIGH (ref 70–99)
Potassium: 3.7 mmol/L (ref 3.5–5.1)
Sodium: 141 mmol/L (ref 135–145)
Total Bilirubin: 0.3 mg/dL (ref 0.3–1.2)
Total Protein: 6.7 g/dL (ref 6.5–8.1)

## 2019-05-16 MED ORDER — SODIUM CHLORIDE 0.9 % IV SOLN
Freq: Once | INTRAVENOUS | Status: AC
  Administered 2019-05-16: 14:00:00 via INTRAVENOUS
  Filled 2019-05-16: qty 250

## 2019-05-16 MED ORDER — ZOLEDRONIC ACID 4 MG/100ML IV SOLN
4.0000 mg | Freq: Once | INTRAVENOUS | Status: AC
  Administered 2019-05-16: 4 mg via INTRAVENOUS
  Filled 2019-05-16: qty 100

## 2019-05-16 MED ORDER — HEPARIN SOD (PORK) LOCK FLUSH 100 UNIT/ML IV SOLN
500.0000 [IU] | Freq: Once | INTRAVENOUS | Status: AC
  Administered 2019-05-16: 500 [IU] via INTRAVENOUS

## 2019-05-19 ENCOUNTER — Other Ambulatory Visit: Payer: Self-pay | Admitting: Student

## 2019-05-19 ENCOUNTER — Ambulatory Visit: Payer: Medicare Other | Admitting: Occupational Therapy

## 2019-05-19 ENCOUNTER — Telehealth: Payer: Self-pay | Admitting: *Deleted

## 2019-05-19 ENCOUNTER — Encounter: Payer: Self-pay | Admitting: Student in an Organized Health Care Education/Training Program

## 2019-05-19 DIAGNOSIS — R197 Diarrhea, unspecified: Secondary | ICD-10-CM

## 2019-05-19 DIAGNOSIS — G8929 Other chronic pain: Secondary | ICD-10-CM

## 2019-05-19 NOTE — Telephone Encounter (Signed)
Attempted to call for pre appointment review of meds/allergies. Unable to leave a message, mailbox is full.

## 2019-05-20 ENCOUNTER — Ambulatory Visit
Payer: Medicare Other | Attending: Student in an Organized Health Care Education/Training Program | Admitting: Student in an Organized Health Care Education/Training Program

## 2019-05-20 ENCOUNTER — Other Ambulatory Visit: Payer: Self-pay

## 2019-05-20 ENCOUNTER — Encounter: Payer: Self-pay | Admitting: Student in an Organized Health Care Education/Training Program

## 2019-05-20 DIAGNOSIS — M1611 Unilateral primary osteoarthritis, right hip: Secondary | ICD-10-CM | POA: Diagnosis not present

## 2019-05-20 DIAGNOSIS — M5416 Radiculopathy, lumbar region: Secondary | ICD-10-CM

## 2019-05-20 DIAGNOSIS — M797 Fibromyalgia: Secondary | ICD-10-CM

## 2019-05-20 DIAGNOSIS — G894 Chronic pain syndrome: Secondary | ICD-10-CM

## 2019-05-20 DIAGNOSIS — M2550 Pain in unspecified joint: Secondary | ICD-10-CM | POA: Diagnosis not present

## 2019-05-20 DIAGNOSIS — M1380 Other specified arthritis, unspecified site: Secondary | ICD-10-CM

## 2019-05-20 MED ORDER — HYDROCODONE-ACETAMINOPHEN 7.5-325 MG PO TABS: 1.0000 | ORAL_TABLET | Freq: Two times a day (BID) | ORAL | 0 refills | Status: DC | PRN

## 2019-05-20 NOTE — Progress Notes (Signed)
Pain Management Virtual Encounter Note - Virtual Visit via [place] (real-time audio visits between healthcare provider and patient).   Patient's Phone No. & Preferred Pharmacy:  [phone] (home); [phone] (mobile); (Preferred) [phone] [name]  [place], [address] [place] [address] Phone: [phone] Fax: [phone]    Pre-screening note:  Our staff contacted [name] and offered her an "in person", "face-to-face" appointment versus a telephone encounter. She indicated preferring the telephone encounter, at this time.   Reason for Virtual Visit: COVID-19*  Social distancing based on CDC and AMA recommendations.   I contacted [place] [name] on 05/20/2019 via video conference.      I clearly identified myself as Victoria Santa, MD. I verified that I was speaking with the correct person using two identifiers (Name: Victoria [name], and date of birth: 05-13-1961).  Advanced Informed Consent I sought verbal advanced consent from Victoria [name] for virtual visit interactions. I informed [name] of possible security and privacy concerns, risks, and limitations associated with providing "not-in-person" medical evaluation and management services. I also informed [name] of the availability of "in-person" appointments. Finally, I informed her that there would be a charge for the virtual visit and that she could be  personally, fully or partially, financially responsible for it. [name] expressed understanding and agreed to proceed.   Historic Elements   [name] is a 58 y.o. year old, female patient evaluated today after her last encounter by our practice on 05/19/2019. [name]  has a past medical history of Asthma, Cancer ([place]), Collagen vascular disease ([place]), COPD (chronic obstructive pulmonary disease) ([place]), Diverticulitis, Fibromyalgia, GERD (gastroesophageal  reflux disease), Hearing impaired person, left, IBS (irritable bowel syndrome), Lupus ([place]), Mitral valve disorder, MS (multiple sclerosis) ([place]), Neuromuscular disorder ([place]), Osteoarthritis, Osteoporosis (05/12/2019), Personal history of chemotherapy, Personal history of radiation therapy (2017), Rheumatoid arthritis ([place]), and Seizures ([place]). She also  has a past surgical history that includes Breast surgery; Abdominal hysterectomy; Appendectomy; Cholecystectomy; Patent ductus arterious repair; Total hip arthroplasty (Right); Reconstruction tendon pulley hand (Left); Breast excisional biopsy (Left, 2017); and Breast excisional biopsy (Left, 2004). [name] has a current medication list which includes the following prescription(s): albuterol, atorvastatin, benzonatate, budesonide-formoterol, cyclobenzaprine, dabigatran, gabapentin, hydrocodone-acetaminophen, hydrocodone-acetaminophen, hydroxychloroquine, leflunomide, letrozole, omeprazole, ondansetron, potassium chloride, tiotropium bromide monohydrate, topiramate, torsemide, trazodone, and ibuprofen, and the following Facility-Administered Medications: sodium chloride flush. She  reports that she quit smoking about 19 years ago. Her smoking use included cigarettes. She has a 30.00 pack-year smoking history. She has never used smokeless tobacco. She reports current alcohol use. She reports that she does not use drugs. [name] is allergic to sodium hypochlorite; codeine; flagyl [metronidazole]; and sulfa antibiotics.   HPI  Today, she is being contacted for medication management.    Patient's pain is at baseline.  Patient continues multimodal pain regimen as prescribed.  States that it provides pain relief and improvement in functional status.   Pharmacotherapy Assessment  Analgesic:  04/28/2019  1   03/18/2019  Hydrocodone-Acetamin 7.5-325  45.00  23 Bi Lat   [id number]   Gib (4800)   0  14.67 MME  Medicare   [place]     Monitoring: Pharmacotherapy:  No side-effects or adverse reactions reported. [place] PMP: PDMP reviewed during this encounter.       Compliance: No problems identified. Effectiveness: Clinically acceptable. Plan: Refer to "POC".  UDS:  Summary  Date Value Ref Range Status  10/21/2018 FINAL  Final    Comment:    ==================================================================== TOXASSURE COMP DRUG ANALYSIS,UR ==================================================================== Test                             Result       Flag       Units Drug Present   Gabapentin                     PRESENT   Topiramate                     PRESENT   Cyclobenzaprine                PRESENT   Desmethylcyclobenzaprine       PRESENT    Desmethylcyclobenzaprine is an expected metabolite of    cyclobenzaprine.   Trazodone                      PRESENT   1,3 chlorophenyl piperazine    PRESENT    1,3-chlorophenyl piperazine is an expected metabolite of    trazodone. ==================================================================== Test                      Result    Flag   Units      Ref Range   Creatinine              127              mg/dL      >=20 ==================================================================== Declared Medications:  Medication list was not provided. ==================================================================== For clinical consultation, please call [phone]. ====================================================================    Laboratory Chemistry Profile (12 mo)  Renal: 05/16/2019: BUN 13; Creatinine, Ser 0.90  Lab Results  Component Value Date   GFRAA >60 05/16/2019   GFRNONAA >60 05/16/2019   Hepatic: 05/16/2019: Albumin 4.0 Lab Results  Component Value Date   AST 23 05/16/2019   ALT 24 05/16/2019   Other: 09/12/2018: CRP <0.8; Sed Rate 16 02/11/2019: Vitamin B-12 397 Note: Above Lab results reviewed.  Imaging  Last 90 days:  Ct Abdomen Pelvis W Contrast  Result Date:  04/03/2019 CLINICAL DATA:  58 year old with left lower quadrant pain. History of diverticulitis and breast cancer. EXAM: CT ABDOMEN AND PELVIS WITH CONTRAST TECHNIQUE: Multidetector CT imaging of the abdomen and pelvis was performed using the standard protocol following bolus administration of intravenous contrast. CONTRAST:  124mL OMNIPAQUE IOHEXOL 300 MG/ML  SOLN COMPARISON:  CT 07/29/2018 and 05/06/2018 FINDINGS: Lower chest: Small pleural-based dependent densities in the right lower lobe are similar to the prior examinations and suggestive for mild scarring. No pleural effusions. Central line tip in the upper right atrium region. Hepatobiliary: Cholecystectomy. Subtle hypodensity or heterogeneity in the posterior right hepatic lobe on sequence 2, image 22 is similar to the previous examination. No distinct lesion in this area. No biliary dilatation. Pancreas: Unremarkable. No pancreatic ductal dilatation or surrounding inflammatory changes. Spleen: Normal in size without focal abnormality. Adrenals/Urinary Tract: Normal adrenal glands. Limited evaluation of the urinary bladder due to artifact from the right hip replacement. No significant bladder distension. Normal appearance of both kidneys. No hydronephrosis. Stomach/Bowel: Normal appearance of the stomach and duodenum. No evidence for bowel dilatation or obstruction. Multiple diverticula involving the lower descending colon and sigmoid colon. Minimal pericolonic stranding in the left lower quadrant on sequence 2, image 64. No evidence for extraluminal gas or fluid in this  area. Vascular/Lymphatic: Atherosclerotic calcifications involving the abdominal aorta without aneurysm. Venous structures are patent including the IVC and the renal veins. Again noted is an IVC filter placed in the juxtarenal IVC. The filter is placed across the right renal vein and the shoulder of the filter appears to be at the level of the left renal vein. Suprarenal IVC is patent.  Portal venous system is patent. Reproductive: Status post hysterectomy. No adnexal masses. Other: Negative for ascites. Small umbilical hernia containing fat. Negative for free air. Musculoskeletal: Right hip replacement is located. IMPRESSION: 1. Colonic diverticula involving the descending colon and sigmoid colon. Question minimal pericolonic stranding in the left lower quadrant. Cannot exclude subtle acute diverticulitis in left lower quadrant. No evidence for extraluminal gas or abscess collections. 2. Stable position of the IVC filter positioned in the juxtarenal IVC. The filter is covering the origin of the right renal vein. IVC filter position is not optimal at this location but unchanged from the prior examinations. No evidence for IVC or renal vein thrombus. Electronically Signed   By: Markus Daft M.D.   On: 04/03/2019 15:11    Assessment  The primary encounter diagnosis was Lumbar radiculopathy. Diagnoses of Fibromyalgia, Polyarthralgia, Primary osteoarthritis of right hip, Chronic pain syndrome, and Seronegative arthritis were also pertinent to this visit.  Plan of Care  I am having [place] [name] start on HYDROcodone-acetaminophen. I am also having her maintain her budesonide-formoterol, albuterol, traZODone, dabigatran, Tiotropium Bromide Monohydrate, leflunomide, benzonatate, hydroxychloroquine, omeprazole, cyclobenzaprine, Topiramate (TOPAMAX PO), gabapentin, potassium chloride, atorvastatin, torsemide, ibuprofen, ondansetron, letrozole, and HYDROcodone-acetaminophen.  Continue multimodal analgesics including gabapentin, ibuprofen, Flexeril as needed.  Pharmacotherapy (Medications Ordered): Meds ordered this encounter  Medications  . HYDROcodone-acetaminophen ([place]) 7.5-325 MG tablet    Sig: Take 1 tablet by mouth 2 (two) times daily as needed for moderate pain.    Dispense:  45 tablet    Refill:  0  . HYDROcodone-acetaminophen (NORCO) 7.5-325 MG tablet    Sig: Take 1 tablet by mouth  2 (two) times daily as needed for moderate pain.    Dispense:  45 tablet    Refill:  0   Follow-up plan:   Return in about 8 weeks (around 07/15/2019) for Medication Management, virtual.    Recent Visits Date Type Provider Dept  03/18/19 Office Visit Victoria Santa, MD [facility]  Showing recent visits within past 90 days and meeting all other requirements   Today's Visits Date Type Provider Dept  05/20/19 Office Visit Victoria Santa, MD [facility]  Showing today's visits and meeting all other requirements   Future Appointments No visits were found meeting these conditions.  Showing future appointments within next 90 days and meeting all other requirements   I discussed the assessment and treatment plan with the patient. The patient was provided an opportunity to ask questions and all were answered. The patient agreed with the plan and demonstrated an understanding of the instructions.  Patient advised to call back or seek an in-person evaluation if the symptoms or condition worsens.  Total duration of non-face-to-face encounter: 25 minutes.  Note by: Victoria Santa, MD Date: 05/20/2019; Time: 11:18 AM  Note: This dictation was prepared with Dragon dictation. Any transcriptional errors that may result from this process are unintentional.  Disclaimer:  * Given the special circumstances of the COVID-19 pandemic, the federal government has announced that the Office for Civil Rights (OCR) will exercise its enforcement discretion and will not impose penalties on physicians using telehealth in the  event of noncompliance with regulatory requirements under the [employer] and Accountability Act (HIPAA) in connection with the good faith provision of telehealth during the IXVEZ-50 national public health emergency. (AMA)

## 2019-05-21 ENCOUNTER — Other Ambulatory Visit: Payer: Self-pay

## 2019-05-21 ENCOUNTER — Ambulatory Visit
Admission: RE | Admit: 2019-05-21 | Discharge: 2019-05-21 | Disposition: A | Discharge status: Home / Self Care | Payer: Medicare Other | Source: Ambulatory Visit | Attending: Student | Admitting: Student

## 2019-05-21 ENCOUNTER — Ambulatory Visit: Payer: Medicare Other | Admitting: Occupational Therapy

## 2019-05-21 DIAGNOSIS — I890 Lymphedema, not elsewhere classified: Secondary | ICD-10-CM | POA: Diagnosis not present

## 2019-05-21 DIAGNOSIS — R1032 Left lower quadrant pain: Secondary | ICD-10-CM | POA: Diagnosis present

## 2019-05-21 DIAGNOSIS — R197 Diarrhea, unspecified: Secondary | ICD-10-CM | POA: Diagnosis present

## 2019-05-21 DIAGNOSIS — G8929 Other chronic pain: Secondary | ICD-10-CM | POA: Diagnosis present

## 2019-05-21 MED ORDER — IOHEXOL 300 MG/ML  SOLN
100.0000 mL | Freq: Once | INTRAMUSCULAR | Status: AC | PRN
  Administered 2019-05-21: 100 mL via INTRAVENOUS

## 2019-05-21 NOTE — Therapy (Signed)
[place] MAIN [facility] SERVICES [address] [place], [place], [address] Phone: [phone]   Fax:  [phone]  Occupational Therapy Treatment  Patient Details  Name: Victoria [name] MRN: [id number] Date of Birth: 07-18-1961 Referring Provider (OT): Marlowe Sax, MD   Encounter Date: 05/21/2019  OT End of Session - 05/21/19 1247    Visit Number  24    Number of Visits  36    Date for OT Re-Evaluation  08/11/19    OT Start Time  1109    OT Stop Time  2820    OT Time Calculation (min)  56 min    Activity Tolerance  Patient tolerated treatment well;No increased pain    Behavior During Therapy  WFL for tasks assessed/performed       Past Medical History:  Diagnosis Date  . Asthma   . Cancer (HCC)    Breast Left, DCIS   . Collagen vascular disease ([place])   . COPD (chronic obstructive pulmonary disease) ([place])   . Diverticulitis   . Fibromyalgia   . GERD (gastroesophageal reflux disease)   . Hearing impaired person, left   . IBS (irritable bowel syndrome)   . Lupus ([place])   . Mitral valve disorder   . MS (multiple sclerosis) ([place])   . Neuromuscular disorder ([place])   . Osteoarthritis   . Osteoporosis 05/12/2019  . Personal history of chemotherapy   . Personal history of radiation therapy 2017   left   . Rheumatoid arthritis ([place])   . Seizures ([place])     Past Surgical History:  Procedure Laterality Date  . ABDOMINAL HYSTERECTOMY    . APPENDECTOMY    . BREAST EXCISIONAL BIOPSY Left 2017   lumpectomy with radation  . BREAST EXCISIONAL BIOPSY Left 2004   neg surgical bx   . BREAST SURGERY    . CHOLECYSTECTOMY    . PATENT DUCTUS ARTERIOUS REPAIR    . RECONSTRUCTION TENDON PULLEY HAND Left   . TOTAL HIP ARTHROPLASTY Right     There were no vitals filed for this visit.  Subjective Assessment - 05/21/19 1109    Subjective   Ms Leggio presents for OT visit 24//36 to address BLE lipo-lymphedema. Pt presents with RLE, knee  length compression wraps in place, and LLE adjustable CircAid in place on LLE as directed.Pt has no new complaints since last  visit. Cronic, multifactorial pain unchanged and not rated by report.    Pertinent History  Complex medical history includes several systemic, inflammatory conditions, including lupus, OA, RA, fibromyalgia. Complex medical history also includes additional contributing conditions including obesity,  hx recurrent DVT and single PE, hx LE cellulitis, asthma and COPD , Hx L breast Ca w SNLB, (Stage 0/ sub clinical LUE/LUQ lymphedema)  partial mastectomy and XRT (2017), MS, and R THA 2009.    Limitations  chronic B leg swelling and associated pain, difficulty walking, impaired transfers and functional mobility, decreased balance,, decreaased joint AROM 2/2 sweling at hips, knees ankles and toes ( difficulty bending to reach feet to perform LB bathing, skin and nail care,  and LB dressing) impaired activity tolerance  for activities requiring standing and/or walking > 20 minutes, (shopping, home management chores, cooking). Impaired body image. Impaired social participation    Repetition  Increases Symptoms    Special Tests  + Stemmer sign B feet    Pain Onset  Other (comment)   chronic  OT Treatments/Exercises (OP) - 05/21/19 0001      ADLs   ADL Education Given  Yes      Manual Therapy   Manual Therapy  Edema management;Soft tissue mobilization;Manual Lymphatic Drainage (MLD);Compression Bandaging    Edema Management  skin care to RLE during MLD as established    Manual Lymphatic Drainage (MLD)  RLE MLD as establ;ished    Compression Bandaging  RLE compression w5rap as established             OT Education - 05/21/19 1247    Education Details  Continued skilled Pt/caregiver education  And LE ADL training throughout visit for lymphedema self care/ home program, including compression wrapping, compression garment and device wear/care,  lymphatic pumping ther ex, simple self-MLD, and skin care. Discussed progress towards goals.    Person(s) Educated  Patient    Methods  Explanation;Demonstration;Handout    Comprehension  Verbalized understanding;Returned demonstration;Need further instruction          OT Long Term Goals - 05/12/19 1148      OT LONG TERM GOAL #1   Title  Pt will demonstrate modified independance with lymphedema (LE) precautions and prevention strategies by identifying 6  items from list provided in LE Workbook  by 4th OT Rx visit to limit LE progression.    Baseline  Max A MET 02/23/19    Time  4   4th OT rx visit   Period  Days    Status  Achieved      OT LONG TERM GOAL #2   Title  Pt will be able to apply  knee length, multi-layer, short stretch compression wraps using correct gradient techniques with mod CG assistance  to achieve optimal limb volume reduction, and to return affected limb , as closely as possible, to premorbid size and shape, to limit infection risk and to improve tissue integrity.    Baseline  dependent 02/23/19 GOAL MET    Time  4    Period  Days    Status  Achieved      OT LONG TERM GOAL #3   Title  Pt to achieve at least 10% limb volume reduction below knees bilaterally  during Intensive Phase CDT  to improve tissue elasticity needed for optimal lymphatic function and joint AROM, , to return limbs to premorbid size and shape, to improve functional transfers and mobility, and to decrease infection risk.    Baseline  Max A 03/21/19: partially achieved with 13.93% volume reduction below the knee on L. RLE CDT  will commence once LLE garments are fitted . They are ordered.    Time  12    Period  Weeks    Status  Partially Met      OT LONG TERM GOAL #4   Title  Pt will achieve  100% compliance with daily LE self-care program with mod  caregiver assistance throughout Intensive Phase CDT, including daily  skin care, lymphatic pumping therex, simple self-MLD and prescribe compression to  ensure optimal limb volume reduction, to reduce infection risk and to limit LE progression for optimal functional performance in all occupational domains.    Baseline  Max A    Time  12    Period  Weeks    Status  Achieved      OT LONG TERM GOAL #5   Title  Pt will demonstrate modified independence using assistive devices and extra time to don and doff properly fitting, custom compression garments/devices for optimal LE self-management  over time.    Baseline  Max A    Time  12    Period  Weeks    Status  Achieved      OT LONG TERM GOAL #6   Title  Pt will retain clinical BLE limb volume reductions over 6 months following intensive phase CDT ( management phase) with mod A from CG and by Pt consistently following all home program components daily to limit LE progression and further functional decline.    Baseline  Dependent    Time  6    Period  Months    Status  On-going            Plan - 05/21/19 1248    Clinical Impression Statement  Pt's sequential pneumatic compression device ("pump") arrived Saturday by report. OT emailed rep to call Pt to arrange Pt training.RLE swelling well managed today with current wraps regime. Pt demonstrates excellent compliance with LLE adjustable wrap. Pt continues to demonstrate progress towards all goals and tolerance for manual therapies are well tolerated. Cont as per POC.       Patient will benefit from skilled therapeutic intervention in order to improve the following deficits and impairments:           Visit Diagnosis: Lymphedema, not elsewhere classified    Problem List Patient Active Problem List   Diagnosis Date Noted  . Osteoporosis 05/12/2019  . Thrombocytopenia ([place]) 01/23/2019  . Leukopenia 01/23/2019  . Lymphedema of both lower extremities 01/16/2019  . Polyarthralgia 10/14/2018  . Headache disorder 10/14/2018  . Chronic daily headache 10/11/2018  . COPD (chronic obstructive pulmonary disease) ([place]) 10/11/2018  .  Thoracic aortic atherosclerosis ([place]) 10/11/2018  . Multiple episodes of deep venous thrombosis ([place]) 09/17/2018  . IBS (irritable bowel syndrome) 09/17/2018  . Healthcare maintenance 09/17/2018  . Primary osteoarthritis of right hip 09/03/2018  . Presence of right hip implant 09/03/2018  . Morbid obesity with BMI of 45.0-49.9, adult ([place]) 08/23/2018  . Shortness of breath 08/22/2018  . History of ductal carcinoma in situ (DCIS) of breast 07/20/2018  . Aromatase inhibitor use 07/20/2018  . Abnormal findings on auscultation 07/16/2018  . Encounter for long-term (current) use of high-risk medication 07/16/2018  . Fibromyalgia 07/16/2018  . Other forms of systemic lupus erythematosus ([place]) 07/16/2018  . Other osteoporosis without current pathological fracture 07/16/2018  . Seronegative arthritis 07/16/2018   Andrey Spearman, MS, OTR/L, [facility] 05/21/19 12:53 PM   [place] MAIN [facility] SERVICES [address] [place], [place], [address] Phone: [phone]   Fax:  [phone]  Name: Victoria [name] MRN: [id number] Date of Birth: [birth year]

## 2019-05-23 ENCOUNTER — Ambulatory Visit: Payer: Medicare Other | Admitting: Occupational Therapy

## 2019-05-27 ENCOUNTER — Ambulatory Visit: Payer: Medicare Other | Admitting: Occupational Therapy

## 2019-05-27 ENCOUNTER — Other Ambulatory Visit: Payer: Self-pay

## 2019-05-27 DIAGNOSIS — I890 Lymphedema, not elsewhere classified: Secondary | ICD-10-CM

## 2019-05-27 NOTE — Therapy (Signed)
[place] MAIN [facility] SERVICES [address] [place] [address] [place], [place], [address] Phone: [phone]   Fax:  [phone]  Occupational Therapy Treatment  Patient Details  Name: Victoria [name] MRN: [id number] Date of Birth: 18-Aug-1960 Referring Provider (OT): Marlowe Sax, MD   Encounter Date: 05/27/2019  OT End of Session - 05/27/19 1247    Visit Number  25    Number of Visits  36    Date for OT Re-Evaluation  08/11/19    OT Start Time  3354    OT Stop Time  1110    OT Time Calculation (min)  55 min    Activity Tolerance  Patient tolerated treatment well;No increased pain    Behavior During Therapy  WFL for tasks assessed/performed       Past Medical History:  Diagnosis Date  . Asthma   . Cancer ([place])    Breast Left, DCIS 2018  . Collagen vascular disease (HCC)    Lupus  . COPD (chronic obstructive pulmonary disease) ([place])   . Diverticulitis   . Fibromyalgia   . GERD (gastroesophageal reflux disease)   . Hearing impaired person, left   . IBS (irritable bowel syndrome)   . Lupus ([place])   . Mitral valve disorder   . Victoria (multiple sclerosis) ([place])   . Neuromuscular disorder ([place])   . Osteoarthritis   . Osteoporosis 05/12/2019  . Personal history of chemotherapy   . Personal history of radiation therapy 2017   left   . Rheumatoid arthritis ([place])   . Seizures ([place])     Past Surgical History:  Procedure Laterality Date  . ABDOMINAL HYSTERECTOMY    . APPENDECTOMY    . BREAST EXCISIONAL BIOPSY Left 2017   lumpectomy with radation  . BREAST EXCISIONAL BIOPSY Left 2004   neg surgical bx   . BREAST SURGERY    . CHOLECYSTECTOMY    . PATENT DUCTUS ARTERIOUS REPAIR    . RECONSTRUCTION TENDON PULLEY HAND Left   . TOTAL HIP ARTHROPLASTY Right     There were no vitals filed for this visit.  Subjective Assessment - 05/27/19 1240    Subjective   Victoria [name] presents for OT visit 25//36 to address BLE lipo-lymphedema. Pt presents  with BLE, alternative,  adjustable CircAids leg wraps in place. Pt states she is pleased with fit and is able to don and doff them with extra time (modified independence).    Pertinent History  Complex medical history includes several systemic, inflammatory conditions, including lupus, OA, RA, fibromyalgia. Complex medical history also includes additional contributing conditions including obesity,  hx recurrent DVT and single PE, hx LE cellulitis, asthma and COPD , Hx L breast Ca w SNLB, (Stage 0/ sub clinical LUE/LUQ lymphedema)  partial mastectomy and XRT (2017), Victoria, and R THA 2009.    Limitations  chronic B leg swelling and associated pain, difficulty walking, impaired transfers and functional mobility, decreased balance,, decreaased joint AROM 2/2 sweling at hips, knees ankles and toes ( difficulty bending to reach feet to perform LB bathing, skin and nail care,  and LB dressing) impaired activity tolerance  for activities requiring standing and/or walking > 20 minutes, (shopping, home management chores, cooking). Impaired body image. Impaired social participation    Repetition  Increases Symptoms    Special Tests  + Stemmer sign B feet    Pain Onset  Other (comment)   chronic  OT Treatments/Exercises (OP) - 05/27/19 0001      ADLs   ADL Education Given  Yes      Manual Therapy   Manual Therapy  Edema management    Edema Management  skin care to RLE during MLD as established    Manual Lymphatic Drainage (MLD)  RLE MLD as establ;ished    Compression Bandaging  RLE compression w5rap as established             OT Education - 05/27/19 1246    Education Details  Reviewed and demonstrated propper donning and doffing techniques for gradient leg wraps, alternatives to ttraditional elastic compression stockings.    Person(s) Educated  Patient    Methods  Explanation;Demonstration;Handout    Comprehension  Verbalized understanding;Returned demonstration;Need  further instruction          OT Long Term Goals - 05/27/19 1249      OT LONG TERM GOAL #1   Title  Pt will demonstrate modified independance with lymphedema (LE) precautions and prevention strategies by identifying 6  items from list provided in LE Workbook  by 4th OT Rx visit to limit LE progression.    Baseline  Max A MET 02/23/19    Time  4   4th OT rx visit   Period  Days    Status  Achieved      OT LONG TERM GOAL #2   Title  Pt will be able to apply  knee length, multi-layer, short stretch compression wraps using correct gradient techniques with mod CG assistance  to achieve optimal limb volume reduction, and to return affected limb , as closely as possible, to premorbid size and shape, to limit infection risk and to improve tissue integrity.    Baseline  dependent 02/23/19 GOAL MET    Time  4    Period  Days    Status  Achieved      OT LONG TERM GOAL #3   Title  Pt to achieve at least 10% limb volume reduction below knees bilaterally  during Intensive Phase CDT  to improve tissue elasticity needed for optimal lymphatic function and joint AROM, , to return limbs to premorbid size and shape, to improve functional transfers and mobility, and to decrease infection risk.    Baseline  Max A 03/21/19: partially achieved with 13.93% volume reduction below the knee on L. RLE CDT  will commence once LLE garments are fitted . They are ordered.    Time  12    Period  Weeks    Status  Partially Met      OT LONG TERM GOAL #4   Title  Pt will achieve  100% compliance with daily LE self-care program with mod  caregiver assistance throughout Intensive Phase CDT, including daily  skin care, lymphatic pumping therex, simple self-MLD and prescribe compression to ensure optimal limb volume reduction, to reduce infection risk and to limit LE progression for optimal functional performance in all occupational domains.    Baseline  Max A    Time  12    Period  Weeks    Status  Achieved      OT LONG  TERM GOAL #5   Title  Pt will demonstrate modified independence using assistive devices and extra time to don and doff properly fitting, custom compression garments/devices for optimal LE self-management over time.    Baseline  Max A    Time  12    Period  Weeks    Status  Achieved      OT LONG TERM GOAL #6   Title  Pt will retain clinical BLE limb volume reductions over 6 months following intensive phase CDT ( management phase) with mod A from CG and by Pt consistently following all home program components daily to limit LE progression and further functional decline.    Baseline  Dependent    Time  6    Period  Months    Status  On-going            Plan - 05/27/19 1247    Clinical Impression Statement  After skilled teaching Pt's technique for donning and doffing CircAid wraps is correct and given extra time she is able to don and doff garments correctly covering legs below knees without gaps and using distal to proximal gradient. Garnements fit appropriately and swelling below the knees bilaterally is well controlled this morning. Pt has met most OT goals for LE care and transitions next to self management phase ofn CDT. Pt agrees with plan to reduce clinc visits to 1 x weekly until compression pump garments are fitted.       Patient will benefit from skilled therapeutic intervention in order to improve the following deficits and impairments:           Visit Diagnosis: Lymphedema, not elsewhere classified    Problem List Patient Active Problem List   Diagnosis Date Noted  . Osteoporosis 05/12/2019  . Thrombocytopenia ([place]) 01/23/2019  . Leukopenia 01/23/2019  . Lymphedema of both lower extremities 01/16/2019  . Polyarthralgia 10/14/2018  . Headache disorder 10/14/2018  . Chronic daily headache 10/11/2018  . COPD (chronic obstructive pulmonary disease) ([place]) 10/11/2018  . Thoracic aortic atherosclerosis ([place]) 10/11/2018  . Multiple episodes of deep venous thrombosis  ([place]) 09/17/2018  . IBS (irritable bowel syndrome) 09/17/2018  . Healthcare maintenance 09/17/2018  . Primary osteoarthritis of right hip 09/03/2018  . Presence of right hip implant 09/03/2018  . Morbid obesity with BMI of 45.0-49.9, adult ([place]) 08/23/2018  . Shortness of breath 08/22/2018  . History of ductal carcinoma in situ (DCIS) of breast 07/20/2018  . Aromatase inhibitor use 07/20/2018  . Abnormal findings on auscultation 07/16/2018  . Encounter for long-term (current) use of high-risk medication 07/16/2018  . Fibromyalgia 07/16/2018  . Other forms of systemic lupus erythematosus ([place]) 07/16/2018  . Other osteoporosis without current pathological fracture 07/16/2018  . Seronegative arthritis 07/16/2018    Andrey Spearman, [place], OTR/L, [facility] 05/27/19 12:55 PM  [place] MAIN [facility] SERVICES [address] [place], [place], [address] Phone: [phone]   Fax:  [phone]  Name: Victoria [name] MRN: [id number] Date of Birth: 03/15/1961

## 2019-05-29 ENCOUNTER — Ambulatory Visit: Payer: Medicare Other | Admitting: Occupational Therapy

## 2019-06-03 ENCOUNTER — Other Ambulatory Visit: Payer: Self-pay

## 2019-06-03 ENCOUNTER — Ambulatory Visit: Payer: Medicare Other | Admitting: Occupational Therapy

## 2019-06-03 DIAGNOSIS — I890 Lymphedema, not elsewhere classified: Secondary | ICD-10-CM | POA: Diagnosis not present

## 2019-06-03 NOTE — Therapy (Signed)
[place] MAIN [facility] SERVICES [address] [place], [place], [address] Phone: [phone]   Fax:  [phone]  Occupational Therapy Treatment  Patient Details  Name: Victoria [name] MRN: [id number] Date of Birth: 1961/02/16 Referring Provider (OT): Marlowe Sax, MD   Encounter Date: 06/03/2019  OT End of Session - 06/03/19 1151    Visit Number  26    Number of Visits  36    Date for OT Re-Evaluation  08/11/19    Activity Tolerance  Patient tolerated treatment well;No increased pain    Behavior During Therapy  WFL for tasks assessed/performed       Past Medical History:  Diagnosis Date  . Asthma   . Cancer ([place])    Breast Left, DCIS 2018  . Collagen vascular disease (HCC)    Lupus  . COPD (chronic obstructive pulmonary disease) ([place])   . Diverticulitis   . Fibromyalgia   . GERD (gastroesophageal reflux disease)   . Hearing impaired person, left   . IBS (irritable bowel syndrome)   . Lupus ([place])   . Mitral valve disorder   . [place] (multiple sclerosis) ([place])   . Neuromuscular disorder ([place])   . Osteoarthritis   . Osteoporosis 05/12/2019  . Personal history of chemotherapy   . Personal history of radiation therapy 2017   left   . Rheumatoid arthritis ([place])   . Seizures ([place])     Past Surgical History:  Procedure Laterality Date  . ABDOMINAL HYSTERECTOMY    . APPENDECTOMY    . BREAST EXCISIONAL BIOPSY Left 2017   lumpectomy with radation  . BREAST EXCISIONAL BIOPSY Left 2004   neg surgical bx   . BREAST SURGERY    . CHOLECYSTECTOMY    . PATENT DUCTUS ARTERIOUS REPAIR    . RECONSTRUCTION TENDON PULLEY HAND Left   . TOTAL HIP ARTHROPLASTY Right     There were no vitals filed for this visit.  Subjective Assessment - 06/03/19 1018    Subjective   Victoria [name] presents for OT visit 26//36 to address BLE lipo-lymphedema. Pt presents with BLE, alternative,  adjustable CircAids leg wraps in place. Pt reports 8/10 back  pain today. Denies leg pain.    Pertinent History  Complex medical history includes several systemic, inflammatory conditions, including lupus, OA, RA, fibromyalgia. Complex medical history also includes additional contributing conditions including obesity,  hx recurrent DVT and single PE, hx LE cellulitis, asthma and COPD , Hx L breast Ca w SNLB, (Stage 0/ sub clinical LUE/LUQ lymphedema)  partial mastectomy and XRT (2017), Victoria, and R THA 2009.    Limitations  chronic B leg swelling and associated pain, difficulty walking, impaired transfers and functional mobility, decreased balance,, decreaased joint AROM 2/2 sweling at hips, knees ankles and toes ( difficulty bending to reach feet to perform LB bathing, skin and nail care,  and LB dressing) impaired activity tolerance  for activities requiring standing and/or walking > 20 minutes, (shopping, home management chores, cooking). Impaired body image. Impaired social participation    Repetition  Increases Symptoms    Special Tests  + Stemmer sign B feet    Pain Onset  Other (comment)   chronic                  OT Treatments/Exercises (OP) - 06/03/19 0001      ADLs   ADL Education Given  Yes      Manual Therapy   Manual Therapy  Edema management  Manual therapy comments  moist heat pack to lumbosacral back throughout MLD to releive pain and facilitate relaxation during MLD    Edema Management  skin care to RLE during MLD as established    Manual Lymphatic Drainage (MLD)  RLE MLD as establ;ished    Compression Bandaging  RLE Circaid after session with max A to save time and limit back pain after session             OT Education - 06/03/19 1150    Education Details  Continued skilled Pt/caregiver education  And LE ADL training throughout visit for lymphedema self care/ home program, including compression wrapping, compression garment and device wear/care, lymphatic pumping ther ex, simple self-MLD, and skin care. Discussed  progress towards goals.    Person(s) Educated  Patient    Methods  Explanation;Demonstration;Handout    Comprehension  Verbalized understanding;Returned demonstration;Need further instruction          OT Long Term Goals - 05/27/19 1249      OT LONG TERM GOAL #1   Title  Pt will demonstrate modified independance with lymphedema (LE) precautions and prevention strategies by identifying 6  items from list provided in LE Workbook  by 4th OT Rx visit to limit LE progression.    Baseline  Max A MET 02/23/19    Time  4   4th OT rx visit   Period  Days    Status  Achieved      OT LONG TERM GOAL #2   Title  Pt will be able to apply  knee length, multi-layer, short stretch compression wraps using correct gradient techniques with mod CG assistance  to achieve optimal limb volume reduction, and to return affected limb , as closely as possible, to premorbid size and shape, to limit infection risk and to improve tissue integrity.    Baseline  dependent 02/23/19 GOAL MET    Time  4    Period  Days    Status  Achieved      OT LONG TERM GOAL #3   Title  Pt to achieve at least 10% limb volume reduction below knees bilaterally  during Intensive Phase CDT  to improve tissue elasticity needed for optimal lymphatic function and joint AROM, , to return limbs to premorbid size and shape, to improve functional transfers and mobility, and to decrease infection risk.    Baseline  Max A 03/21/19: partially achieved with 13.93% volume reduction below the knee on L. RLE CDT  will commence once LLE garments are fitted . They are ordered.    Time  12    Period  Weeks    Status  Partially Met      OT LONG TERM GOAL #4   Title  Pt will achieve  100% compliance with daily LE self-care program with mod  caregiver assistance throughout Intensive Phase CDT, including daily  skin care, lymphatic pumping therex, simple self-MLD and prescribe compression to ensure optimal limb volume reduction, to reduce infection risk and  to limit LE progression for optimal functional performance in all occupational domains.    Baseline  Max A    Time  12    Period  Weeks    Status  Achieved      OT LONG TERM GOAL #5   Title  Pt will demonstrate modified independence using assistive devices and extra time to don and doff properly fitting, custom compression garments/devices for optimal LE self-management over time.    Baseline  Max  A    Time  12    Period  Weeks    Status  Achieved      OT LONG TERM GOAL #6   Title  Pt will retain clinical BLE limb volume reductions over 6 months following intensive phase CDT ( management phase) with mod A from CG and by Pt consistently following all home program components daily to limit LE progression and further functional decline.    Baseline  Dependent    Time  6    Period  Months    Status  On-going            Plan - 06/03/19 1151    Clinical Impression Statement  As requested, [employer] rep assisted Pt over the phone to correct problem with hose set up and compression garments with device. Pt reports device is working well now. Issue resolved. Pt tolerated MLD to RLE/RLQ without difficulty. Pt denies increased pain with Rx. Cont as per POC with clinical MLD 1 x weekly for 3-4 remaining visits to assist Pt with transition from Intensive Phase to Self-Management Phase CDT.Marland Kitchen       Patient will benefit from skilled therapeutic intervention in order to improve the following deficits and impairments:           Visit Diagnosis: Lymphedema, not elsewhere classified    Problem List Patient Active Problem List   Diagnosis Date Noted  . Osteoporosis 05/12/2019  . Thrombocytopenia ([place]) 01/23/2019  . Leukopenia 01/23/2019  . Lymphedema of both lower extremities 01/16/2019  . Polyarthralgia 10/14/2018  . Headache disorder 10/14/2018  . Chronic daily headache 10/11/2018  . COPD (chronic obstructive pulmonary disease) ([place]) 10/11/2018  . Thoracic aortic atherosclerosis  ([place]) 10/11/2018  . Multiple episodes of deep venous thrombosis ([place]) 09/17/2018  . IBS (irritable bowel syndrome) 09/17/2018  . Healthcare maintenance 09/17/2018  . Primary osteoarthritis of right hip 09/03/2018  . Presence of right hip implant 09/03/2018  . Morbid obesity with BMI of 45.0-49.9, adult ([place]) 08/23/2018  . Shortness of breath 08/22/2018  . History of ductal carcinoma in situ (DCIS) of breast 07/20/2018  . Aromatase inhibitor use 07/20/2018  . Abnormal findings on auscultation 07/16/2018  . Encounter for long-term (current) use of high-risk medication 07/16/2018  . Fibromyalgia 07/16/2018  . Other forms of systemic lupus erythematosus ([place]) 07/16/2018  . Other osteoporosis without current pathological fracture 07/16/2018  . Seronegative arthritis 07/16/2018    Andrey Spearman, [place], OTR/L, [facility] 06/03/19 11:56 AM  [place] MAIN [facility] SERVICES [address] [place], [place], [address] Phone: [phone]   Fax:  [phone]  Name: Victoria [name] MRN: [id number] Date of Birth: November 01, 1960

## 2019-06-05 ENCOUNTER — Encounter: Payer: Medicare Other | Admitting: Occupational Therapy

## 2019-06-09 ENCOUNTER — Encounter: Payer: Medicare Other | Admitting: Occupational Therapy

## 2019-06-10 ENCOUNTER — Other Ambulatory Visit: Payer: Self-pay

## 2019-06-10 ENCOUNTER — Ambulatory Visit: Payer: Medicare Other | Attending: Internal Medicine | Admitting: Occupational Therapy

## 2019-06-10 DIAGNOSIS — I890 Lymphedema, not elsewhere classified: Secondary | ICD-10-CM | POA: Diagnosis present

## 2019-06-10 NOTE — Therapy (Signed)
[place] MAIN [facility] SERVICES [address] [place], [place], [address] Phone: [phone]   Fax:  [phone]  Occupational Therapy Treatment  Patient Details  Name: Victoria [name] MRN: [id number] Date of Birth: October 31, 1960 Referring Provider (OT): Marlowe Sax, MD   Encounter Date: 06/10/2019  OT End of Session - 06/10/19 1632    Visit Number  27    Number of Visits  36    Date for OT Re-Evaluation  08/11/19    OT Start Time  7124    OT Stop Time  5809    OT Time Calculation (min)  60 min    Activity Tolerance  Patient tolerated treatment well;No increased pain    Behavior During Therapy  WFL for tasks assessed/performed       Past Medical History:  Diagnosis Date  . Asthma   . Cancer ([place])    Breast Left, DCIS 2018  . Collagen vascular disease (HCC)    Lupus  . COPD (chronic obstructive pulmonary disease) ([place])   . Diverticulitis   . Fibromyalgia   . GERD (gastroesophageal reflux disease)   . Hearing impaired person, left   . IBS (irritable bowel syndrome)   . Lupus ([place])   . Mitral valve disorder   . MS (multiple sclerosis) ([place])   . Neuromuscular disorder ([place])   . Osteoarthritis   . Osteoporosis 05/12/2019  . Personal history of chemotherapy   . Personal history of radiation therapy 2017   left   . Rheumatoid arthritis ([place])   . Seizures ([place])     Past Surgical History:  Procedure Laterality Date  . ABDOMINAL HYSTERECTOMY    . APPENDECTOMY    . BREAST EXCISIONAL BIOPSY Left 2017   lumpectomy with radation  . BREAST EXCISIONAL BIOPSY Left 2004   neg surgical bx   . BREAST SURGERY    . CHOLECYSTECTOMY    . PATENT DUCTUS ARTERIOUS REPAIR    . RECONSTRUCTION TENDON PULLEY HAND Left   . TOTAL HIP ARTHROPLASTY Right     There were no vitals filed for this visit.  Subjective Assessment - 06/10/19 1627    Subjective   Ms Butz presents for OT visit 27//36 to address BLE lipo-lymphedema. Pt presents  with knee length adjustable Circaid in place on the RLE, but reports increased swelling in the LLE prevented her from being able to don comression this morning. Pt denies any known precipitating event that may have exacerbated swelling. She denies acute leg pain.    Pertinent History  Complex medical history includes several systemic, inflammatory conditions, including lupus, OA, RA, fibromyalgia. Complex medical history also includes additional contributing conditions including obesity,  hx recurrent DVT and single PE, hx LE cellulitis, asthma and COPD , Hx L breast Ca w SNLB, (Stage 0/ sub clinical LUE/LUQ lymphedema)  partial mastectomy and XRT (2017), MS, and R THA 2009.    Limitations  chronic B leg swelling and associated pain, difficulty walking, impaired transfers and functional mobility, decreased balance,, decreaased joint AROM 2/2 sweling at hips, knees ankles and toes ( difficulty bending to reach feet to perform LB bathing, skin and nail care,  and LB dressing) impaired activity tolerance  for activities requiring standing and/or walking > 20 minutes, (shopping, home management chores, cooking). Impaired body image. Impaired social participation    Repetition  Increases Symptoms    Special Tests  + Stemmer sign B feet    Pain Onset  Other (comment)   chronic  OT Treatments/Exercises (OP) - 06/10/19 0001      ADLs   ADL Education Given  Yes      Manual Therapy   Manual Therapy  Edema management;Manual Lymphatic Drainage (MLD);Compression Bandaging    Edema Management  skin care to RLE during MLD as established    Manual Lymphatic Drainage (MLD)  LLE MLD as establ;ished    Compression Bandaging  LLE Circaid after session with max A to save time and limit back pain after session             OT Education - 06/10/19 1630    Education Details  Pt edu for signs and symptoms of blood clot and cellulitis infection.Pt encouraged to call her PCP and report   acute onset of LLE swelling to Dr and request advice and recommendations. Explore  option for Doppler study to rule out DVT    Person(s) Educated  Patient    Methods  Explanation;Demonstration;Handout    Comprehension  Verbalized understanding;Returned demonstration;Need further instruction          OT Long Term Goals - 05/27/19 1249      OT LONG TERM GOAL #1   Title  Pt will demonstrate modified independance with lymphedema (LE) precautions and prevention strategies by identifying 6  items from list provided in LE Workbook  by 4th OT Rx visit to limit LE progression.    Baseline  Max A MET 02/23/19    Time  4   4th OT rx visit   Period  Days    Status  Achieved      OT LONG TERM GOAL #2   Title  Pt will be able to apply  knee length, multi-layer, short stretch compression wraps using correct gradient techniques with mod CG assistance  to achieve optimal limb volume reduction, and to return affected limb , as closely as possible, to premorbid size and shape, to limit infection risk and to improve tissue integrity.    Baseline  dependent 02/23/19 GOAL MET    Time  4    Period  Days    Status  Achieved      OT LONG TERM GOAL #3   Title  Pt to achieve at least 10% limb volume reduction below knees bilaterally  during Intensive Phase CDT  to improve tissue elasticity needed for optimal lymphatic function and joint AROM, , to return limbs to premorbid size and shape, to improve functional transfers and mobility, and to decrease infection risk.    Baseline  Max A 03/21/19: partially achieved with 13.93% volume reduction below the knee on L. RLE CDT  will commence once LLE garments are fitted . They are ordered.    Time  12    Period  Weeks    Status  Partially Met      OT LONG TERM GOAL #4   Title  Pt will achieve  100% compliance with daily LE self-care program with mod  caregiver assistance throughout Intensive Phase CDT, including daily  skin care, lymphatic pumping therex, simple  self-MLD and prescribe compression to ensure optimal limb volume reduction, to reduce infection risk and to limit LE progression for optimal functional performance in all occupational domains.    Baseline  Max A    Time  12    Period  Weeks    Status  Achieved      OT LONG TERM GOAL #5   Title  Pt will demonstrate modified independence using assistive devices and extra time  to don and doff properly fitting, custom compression garments/devices for optimal LE self-management over time.    Baseline  Max A    Time  12    Period  Weeks    Status  Achieved      OT LONG TERM GOAL #6   Title  Pt will retain clinical BLE limb volume reductions over 6 months following intensive phase CDT ( management phase) with mod A from CG and by Pt consistently following all home program components daily to limit LE progression and further functional decline.    Baseline  Dependent    Time  6    Period  Months    Status  On-going            Plan - 06/10/19 1633    Clinical Impression Statement  Pt presenting with acute increase in LLE swelling without pain. Pt denies fall , or other known precipitating event. Skin temperature and color are WNL. Pt denies    tenderness or pain with palpation. Signs and symptoms of infection are absent. Pt reports hx of DVT without pain, so I encouraged her to call her PCP when she gets home and report symptoms. She is due to see her PCP tomorrow by report. Pt tolerated MLD without any issues. tissue density below the knee is somewhat denser than usual, Compression wrap is snug, but fits. Cont as per POC. Follow up with MD visit at next session.       Patient will benefit from skilled therapeutic intervention in order to improve the following deficits and impairments:           Visit Diagnosis: Lymphedema, not elsewhere classified    Problem List Patient Active Problem List   Diagnosis Date Noted  . Osteoporosis 05/12/2019  . Thrombocytopenia ([place]) 01/23/2019   . Leukopenia 01/23/2019  . Lymphedema of both lower extremities 01/16/2019  . Polyarthralgia 10/14/2018  . Headache disorder 10/14/2018  . Chronic daily headache 10/11/2018  . COPD (chronic obstructive pulmonary disease) ([place]) 10/11/2018  . Thoracic aortic atherosclerosis ([place]) 10/11/2018  . Multiple episodes of deep venous thrombosis ([place]) 09/17/2018  . IBS (irritable bowel syndrome) 09/17/2018  . Healthcare maintenance 09/17/2018  . Primary osteoarthritis of right hip 09/03/2018  . Presence of right hip implant 09/03/2018  . Morbid obesity with BMI of 45.0-49.9, adult ([place]) 08/23/2018  . Shortness of breath 08/22/2018  . History of ductal carcinoma in situ (DCIS) of breast 07/20/2018  . Aromatase inhibitor use 07/20/2018  . Abnormal findings on auscultation 07/16/2018  . Encounter for long-term (current) use of high-risk medication 07/16/2018  . Fibromyalgia 07/16/2018  . Other forms of systemic lupus erythematosus ([place]) 07/16/2018  . Other osteoporosis without current pathological fracture 07/16/2018  . Seronegative arthritis 07/16/2018    Andrey Spearman, MS, OTR/L, [facility] 06/10/19 4:40 PM   [place] MAIN [facility] SERVICES [address] [place], [place], [address] Phone: [phone]   Fax:  [phone]  Name: Joselynn Amoroso MRN: [id number] Date of Birth: 1960-10-14

## 2019-06-13 ENCOUNTER — Ambulatory Visit: Admit: 2019-06-13 | Payer: Medicare Other | Admitting: General Surgery

## 2019-06-13 SURGERY — COLONOSCOPY WITH PROPOFOL
Anesthesia: General

## 2019-06-16 ENCOUNTER — Encounter: Payer: Medicare Other | Admitting: Occupational Therapy

## 2019-06-16 ENCOUNTER — Other Ambulatory Visit: Payer: Self-pay

## 2019-06-17 ENCOUNTER — Other Ambulatory Visit: Payer: Self-pay

## 2019-06-17 ENCOUNTER — Inpatient Hospital Stay: Payer: Medicare Other | Attending: Oncology

## 2019-06-17 DIAGNOSIS — D0512 Intraductal carcinoma in situ of left breast: Secondary | ICD-10-CM | POA: Insufficient documentation

## 2019-06-17 DIAGNOSIS — D500 Iron deficiency anemia secondary to blood loss (chronic): Secondary | ICD-10-CM

## 2019-06-17 DIAGNOSIS — Z452 Encounter for adjustment and management of vascular access device: Secondary | ICD-10-CM | POA: Insufficient documentation

## 2019-06-17 MED ORDER — HEPARIN SOD (PORK) LOCK FLUSH 100 UNIT/ML IV SOLN
500.0000 [IU] | Freq: Once | INTRAVENOUS | Status: AC
  Administered 2019-06-17: 11:00:00 500 [IU] via INTRAVENOUS
  Filled 2019-06-17: qty 5

## 2019-06-17 MED ORDER — SODIUM CHLORIDE 0.9% FLUSH
10.0000 mL | Freq: Once | INTRAVENOUS | Status: AC
  Administered 2019-06-17: 11:00:00 10 mL via INTRAVENOUS
  Filled 2019-06-17: qty 10

## 2019-06-18 ENCOUNTER — Ambulatory Visit: Payer: Medicare Other | Admitting: Occupational Therapy

## 2019-06-18 DIAGNOSIS — I890 Lymphedema, not elsewhere classified: Secondary | ICD-10-CM

## 2019-06-18 NOTE — Therapy (Signed)
[place] MAIN [facility] SERVICES [address] [place], [place], [address] Phone: [phone]   Fax:  [phone]  Occupational Therapy Treatment  Patient Details  Name: Victoria [name] MRN: [id number] Date of Birth: 09-Jun-1961 Referring Provider (OT): Marlowe Sax, MD   Encounter Date: 06/18/2019  OT End of Session - 06/18/19 1638    Visit Number  28    Number of Visits  36    Date for OT Re-Evaluation  08/11/19    OT Start Time  1115    OT Stop Time  1215    OT Time Calculation (min)  60 min    Activity Tolerance  Patient tolerated treatment well;No increased pain    Behavior During Therapy  WFL for tasks assessed/performed       Past Medical History:  Diagnosis Date  . Asthma   . Cancer ([place])    Breast Left, DCIS 2018  . Collagen vascular disease (HCC)    Lupus  . COPD (chronic obstructive pulmonary disease) ([place])   . Diverticulitis   . Fibromyalgia   . GERD (gastroesophageal reflux disease)   . Hearing impaired person, left   . IBS (irritable bowel syndrome)   . Lupus ([place])   . Mitral valve disorder   . MS (multiple sclerosis) ([place])   . Neuromuscular disorder ([place])   . Osteoarthritis   . Osteoporosis 05/12/2019  . Personal history of chemotherapy   . Personal history of radiation therapy 2017   left   . Rheumatoid arthritis ([place])   . Seizures ([place])     Past Surgical History:  Procedure Laterality Date  . ABDOMINAL HYSTERECTOMY    . APPENDECTOMY    . BREAST EXCISIONAL BIOPSY Left 2017   lumpectomy with radation  . BREAST EXCISIONAL BIOPSY Left 2004   neg surgical bx   . BREAST SURGERY    . CHOLECYSTECTOMY    . PATENT DUCTUS ARTERIOUS REPAIR    . RECONSTRUCTION TENDON PULLEY HAND Left   . TOTAL HIP ARTHROPLASTY Right     There were no vitals filed for this visit.  Subjective Assessment - 06/18/19 1634    Subjective   Ms Bankert presents for OT visit 28/36 to address BLE lipo-lymphedema. Pt presents  with knee length adjustable Circaid in place on the RLE. She reports she saw PCP re increased bilateral leg swelling, and his advice was to increase diuretic. Pt expresses ongoing concern re possible systemic cause , aka kidney problems, which are nut unlikely due to lupis.    Pertinent History  Complex medical history includes several systemic, inflammatory conditions, including lupus, OA, RA, fibromyalgia. Complex medical history also includes additional contributing conditions including obesity,  hx recurrent DVT and single PE, hx LE cellulitis, asthma and COPD , Hx L breast Ca w SNLB, (Stage 0/ sub clinical LUE/LUQ lymphedema)  partial mastectomy and XRT (2017), MS, and R THA 2009.    Limitations  chronic B leg swelling and associated pain, difficulty walking, impaired transfers and functional mobility, decreased balance,, decreaased joint AROM 2/2 sweling at hips, knees ankles and toes ( difficulty bending to reach feet to perform LB bathing, skin and nail care,  and LB dressing) impaired activity tolerance  for activities requiring standing and/or walking > 20 minutes, (shopping, home management chores, cooking). Impaired body image. Impaired social participation    Repetition  Increases Symptoms    Special Tests  + Stemmer sign B feet    Pain Onset  Other (comment)  chronic                  OT Treatments/Exercises (OP) - 06/18/19 0001      ADLs   ADL Education Given  Yes      Manual Therapy   Manual Therapy  Edema management;Manual Lymphatic Drainage (MLD)    Edema Management  skin care to RLE during MLD as established    Manual Lymphatic Drainage (MLD)  LLE MLD as establ;ished    Compression Bandaging  BLE Circaid after session with max A to save time and limit back pain after session             OT Education - 06/18/19 1637    Education Details  Cont Pt edu re LE self care. Discussed differential dx re LE vs CKD, CHF, CVI, etc    Person(s) Educated  Patient     Methods  Explanation;Demonstration;Handout    Comprehension  Verbalized understanding;Returned demonstration;Need further instruction          OT Long Term Goals - 05/27/19 1249      OT LONG TERM GOAL #1   Title  Pt will demonstrate modified independance with lymphedema (LE) precautions and prevention strategies by identifying 6  items from list provided in LE Workbook  by 4th OT Rx visit to limit LE progression.    Baseline  Max A MET 02/23/19    Time  4   4th OT rx visit   Period  Days    Status  Achieved      OT LONG TERM GOAL #2   Title  Pt will be able to apply  knee length, multi-layer, short stretch compression wraps using correct gradient techniques with mod CG assistance  to achieve optimal limb volume reduction, and to return affected limb , as closely as possible, to premorbid size and shape, to limit infection risk and to improve tissue integrity.    Baseline  dependent 02/23/19 GOAL MET    Time  4    Period  Days    Status  Achieved      OT LONG TERM GOAL #3   Title  Pt to achieve at least 10% limb volume reduction below knees bilaterally  during Intensive Phase CDT  to improve tissue elasticity needed for optimal lymphatic function and joint AROM, , to return limbs to premorbid size and shape, to improve functional transfers and mobility, and to decrease infection risk.    Baseline  Max A 03/21/19: partially achieved with 13.93% volume reduction below the knee on L. RLE CDT  will commence once LLE garments are fitted . They are ordered.    Time  12    Period  Weeks    Status  Partially Met      OT LONG TERM GOAL #4   Title  Pt will achieve  100% compliance with daily LE self-care program with mod  caregiver assistance throughout Intensive Phase CDT, including daily  skin care, lymphatic pumping therex, simple self-MLD and prescribe compression to ensure optimal limb volume reduction, to reduce infection risk and to limit LE progression for optimal functional performance in  all occupational domains.    Baseline  Max A    Time  12    Period  Weeks    Status  Achieved      OT LONG TERM GOAL #5   Title  Pt will demonstrate modified independence using assistive devices and extra time to don and doff properly fitting, custom compression  garments/devices for optimal LE self-management over time.    Baseline  Max A    Time  12    Period  Weeks    Status  Achieved      OT LONG TERM GOAL #6   Title  Pt will retain clinical BLE limb volume reductions over 6 months following intensive phase CDT ( management phase) with mod A from CG and by Pt consistently following all home program components daily to limit LE progression and further functional decline.    Baseline  Dependent    Time  6    Period  Months    Status  On-going            Plan - 06/18/19 1639    Clinical Impression Statement  Pt presenting again today with generally symmetrical increase in BLE swelling. Pt has new bruise just above R knee. Pt reminded easy bruising is typical of lipo-lymphedema. Pt reports SOB at rest intermittently, but no SOB noted during MLD and after donning knee length compression wraps. Pt advised NOT to use lymphedema "pump" more than once daily on one leg to limit fluid overload return to the heart. Cont  to monitor fluctuating condition. Systemic etiology suspected. Pt agrees with plan to attend 2nd visit this week then return for 2 week follow along. We'll determine frequency going forward based on stability of LE condition.       Patient will benefit from skilled therapeutic intervention in order to improve the following deficits and impairments:           Visit Diagnosis: Lymphedema, not elsewhere classified    Problem List Patient Active Problem List   Diagnosis Date Noted  . Osteoporosis 05/12/2019  . Thrombocytopenia ([place]) 01/23/2019  . Leukopenia 01/23/2019  . Lymphedema of both lower extremities 01/16/2019  . Polyarthralgia 10/14/2018  . Headache  disorder 10/14/2018  . Chronic daily headache 10/11/2018  . COPD (chronic obstructive pulmonary disease) ([place]) 10/11/2018  . Thoracic aortic atherosclerosis ([place]) 10/11/2018  . Multiple episodes of deep venous thrombosis ([place]) 09/17/2018  . IBS (irritable bowel syndrome) 09/17/2018  . Healthcare maintenance 09/17/2018  . Primary osteoarthritis of right hip 09/03/2018  . Presence of right hip implant 09/03/2018  . Morbid obesity with BMI of 45.0-49.9, adult ([place]) 08/23/2018  . Shortness of breath 08/22/2018  . History of ductal carcinoma in situ (DCIS) of breast 07/20/2018  . Aromatase inhibitor use 07/20/2018  . Abnormal findings on auscultation 07/16/2018  . Encounter for long-term (current) use of high-risk medication 07/16/2018  . Fibromyalgia 07/16/2018  . Other forms of systemic lupus erythematosus ([place]) 07/16/2018  . Other osteoporosis without current pathological fracture 07/16/2018  . Seronegative arthritis 07/16/2018    Andrey Spearman, MS, OTR/L, [facility] 06/18/19 4:45 PM  [place] MAIN [facility] SERVICES [address] [place], [place], [address] Phone: [phone]   Fax:  [phone]  Name: Chrisandra Wiemers MRN: [id number] Date of Birth: 05/02/1961

## 2019-06-23 ENCOUNTER — Encounter: Payer: Medicare Other | Admitting: Occupational Therapy

## 2019-06-25 ENCOUNTER — Ambulatory Visit: Payer: Medicare Other | Admitting: Occupational Therapy

## 2019-07-01 ENCOUNTER — Other Ambulatory Visit: Payer: Self-pay

## 2019-07-01 ENCOUNTER — Ambulatory Visit: Payer: Medicare Other | Admitting: Occupational Therapy

## 2019-07-01 DIAGNOSIS — I890 Lymphedema, not elsewhere classified: Secondary | ICD-10-CM | POA: Diagnosis not present

## 2019-07-01 NOTE — Therapy (Signed)
[place] MAIN [facility] SERVICES [address] [place], [place], [address] Phone: [phone]   Fax:  [phone]  Occupational Therapy Treatment Note and Progress Report" Lymphedema Care Patient Details  Name: Victoria [name] MRN: [id number] Date of Birth: [birth year] Referring Provider (OT): Marlowe Sax, MD   Encounter Date: 07/01/2019  OT End of Session - 07/01/19 1144    Visit Number  29    Number of Visits  36    Date for OT Re-Evaluation  08/11/19    OT Start Time  0800    OT Stop Time  0900    OT Time Calculation (min)  60 min    Activity Tolerance  Patient tolerated treatment well;No increased pain    Behavior During Therapy  WFL for tasks assessed/performed       Past Medical History:  Diagnosis Date  . Asthma   . Cancer ([place])    Breast Left, DCIS 2018  . Collagen vascular disease (HCC)    Lupus  . COPD (chronic obstructive pulmonary disease) ([place])   . Diverticulitis   . Fibromyalgia   . GERD (gastroesophageal reflux disease)   . Hearing impaired person, left   . IBS (irritable bowel syndrome)   . Lupus ([place])   . Mitral valve disorder   . MS (multiple sclerosis) ([place])   . Neuromuscular disorder ([place])   . Osteoarthritis   . Osteoporosis 05/12/2019  . Personal history of chemotherapy   . Personal history of radiation therapy 2017   left   . Rheumatoid arthritis ([place])   . Seizures ([place])     Past Surgical History:  Procedure Laterality Date  . ABDOMINAL HYSTERECTOMY    . APPENDECTOMY    . BREAST EXCISIONAL BIOPSY Left 2017   lumpectomy with radation  . BREAST EXCISIONAL BIOPSY Left 2004   neg surgical bx   . BREAST SURGERY    . CHOLECYSTECTOMY    . PATENT DUCTUS ARTERIOUS REPAIR    . RECONSTRUCTION TENDON PULLEY HAND Left   . TOTAL HIP ARTHROPLASTY Right     There were no vitals filed for this visit.       LYMPHEDEMA/ONCOLOGY QUESTIONNAIRE - 07/01/19 1155      Right Lower Extremity Lymphedema    Other  RLE A-D volume = 3547.81 ml. RLE E-G volume = 8876.0 ml. RLE A=G (Ankle to groin) volume =12423.81 ml.    Other  RLE A-D volume is decreased by 7.63%,  RLE E-G volume is decreased by 7.16%; and RLE full leg (A-D) volume is decreased by 7.35%. These reductions were achieved as secondary gains as hands on therapy focused primarily on LLE as Rx limb.              OT Treatments/Exercises (OP) - 07/01/19 0001      ADLs   ADL Education Given  Yes      Manual Therapy   Manual Therapy  Edema management    Edema Management  BLE comparative limb volumetrics    Manual Lymphatic Drainage (MLD)  skin care w/ low ph Eucerin lotion to  increase hydration and skin mobility                  OT Long Term Goals - 07/01/19 1211      OT LONG TERM GOAL #1   Title  Pt will demonstrate modified independance with lymphedema (LE) precautions and prevention strategies by identifying 6  items from list provided in LE Workbook  by 4th  OT Rx visit to limit LE progression.    Baseline  Max A MET 02/23/19    Time  4   4th OT rx visit   Period  Days    Status  Achieved      OT LONG TERM GOAL #2   Title  Pt will be able to apply  knee length, multi-layer, short stretch compression wraps using correct gradient techniques with mod CG assistance  to achieve optimal limb volume reduction, and to return affected limb , as closely as possible, to premorbid size and shape, to limit infection risk and to improve tissue integrity.    Baseline  dependent 02/23/19 GOAL MET    Time  4    Period  Days    Status  Achieved      OT LONG TERM GOAL #3   Title  Pt to achieve at least 10% limb volume reduction below knees bilaterally  during Intensive Phase CDT  to improve tissue elasticity needed for optimal lymphatic function and joint AROM, , to return limbs to premorbid size and shape, to improve functional transfers and mobility, and to decrease infection risk.    Baseline  Max A Met for LLE. Partially  met for RLE    Time  12    Period  Weeks    Status  Partially Met      OT LONG TERM GOAL #4   Title  Pt will achieve  100% compliance with daily LE self-care program with mod  caregiver assistance throughout Intensive Phase CDT, including daily  skin care, lymphatic pumping therex, simple self-MLD and prescribe compression to ensure optimal limb volume reduction, to reduce infection risk and to limit LE progression for optimal functional performance in all occupational domains.    Baseline  Max A    Time  12    Period  Weeks    Status  Achieved      OT LONG TERM GOAL #5   Title  Pt will demonstrate modified independence using assistive devices and extra time to don and doff properly fitting, custom compression garments/devices for optimal LE self-management over time.    Baseline  Max A    Time  12    Period  Weeks    Status  Achieved      OT LONG TERM GOAL #6   Title  Pt will retain clinical BLE limb volume reductions over 6 months following intensive phase CDT ( management phase) with mod A from CG and by Pt consistently following all home program components daily to limit LE progression and further functional decline.    Baseline  Dependent    Time  6    Period  Months    Status  On-going            Plan - 07/01/19 1151    Clinical Impression Statement  Today is final day of Intensive Phase CDT for lymphedema care. Comparative limb volumetrics reveal excellent limb volume decreases bialterally at all landmarks. LLE leg volume (Ankle to tibial tuberosity;  A-D) is decreased by 16.51% since commencing OT for CDT on 02/13/19. LLE thigh ( knee to groin ; E-G) is decreased by 10.21%, and LLE full leg volume (ankle to groin; A-G) is decreased by 12.21% since commencing OT for CDT. Pt met 10% limb volume reduction goalfort all segments as well as full leg for LLE. RLE A-D volume is decreased by 7.63%,  RLE E-G volume is decreased by 7.16%; and RLE full leg (  A-D) volume is decreased by  7.35%. These reductions were achieved as secondary gains as hands on therapy focused primarily on LLE as Rx limb. Pt has responded very well to Rx and has met most OT goals for lymphedema care. She transitions today to self care phase of CDT. She is encouraged to remain diligent and compliant with all LE self-care home protocols, including simple self-MLD, skin care. ther ex, compression and daily use of sequential pneumatic compression device, or Flexitouch "pump". Pt agrees with pplan to return for 3 month follow up . She'll call during interval PRN.       Patient will benefit from skilled therapeutic intervention in order to improve the following deficits and impairments:           Visit Diagnosis: Lymphedema, not elsewhere classified    Problem List Patient Active Problem List   Diagnosis Date Noted  . Osteoporosis 05/12/2019  . Thrombocytopenia ([place]) 01/23/2019  . Leukopenia 01/23/2019  . Lymphedema of both lower extremities 01/16/2019  . Polyarthralgia 10/14/2018  . Headache disorder 10/14/2018  . Chronic daily headache 10/11/2018  . COPD (chronic obstructive pulmonary disease) ([place]) 10/11/2018  . Thoracic aortic atherosclerosis ([place]) 10/11/2018  . Multiple episodes of deep venous thrombosis ([place]) 09/17/2018  . IBS (irritable bowel syndrome) 09/17/2018  . Healthcare maintenance 09/17/2018  . Primary osteoarthritis of right hip 09/03/2018  . Presence of right hip implant 09/03/2018  . Morbid obesity with BMI of 45.0-49.9, adult ([place]) 08/23/2018  . Shortness of breath 08/22/2018  . History of ductal carcinoma in situ (DCIS) of breast 07/20/2018  . Aromatase inhibitor use 07/20/2018  . Abnormal findings on auscultation 07/16/2018  . Encounter for long-term (current) use of high-risk medication 07/16/2018  . Fibromyalgia 07/16/2018  . Other forms of systemic lupus erythematosus ([place]) 07/16/2018  . Other osteoporosis without current pathological fracture 07/16/2018  .  Seronegative arthritis 07/16/2018     Andrey Spearman, MS, OTR/L, [facility] 07/01/19 12:13 PM   [place] MAIN [facility] SERVICES [address] [place], [place], [address] Phone: [phone]   Fax:  [phone]  Name: Victoria [name] MRN: [id number] Date of Birth: 23-Mar-1961

## 2019-07-04 ENCOUNTER — Other Ambulatory Visit
Admission: RE | Admit: 2019-07-04 | Discharge: 2019-07-04 | Disposition: A | Discharge status: Home / Self Care | Payer: Medicare Other | Source: Ambulatory Visit | Attending: Internal Medicine | Admitting: Internal Medicine

## 2019-07-04 ENCOUNTER — Other Ambulatory Visit: Payer: Self-pay

## 2019-07-04 DIAGNOSIS — Z20828 Contact with and (suspected) exposure to other viral communicable diseases: Secondary | ICD-10-CM | POA: Diagnosis not present

## 2019-07-04 DIAGNOSIS — Z01812 Encounter for preprocedural laboratory examination: Secondary | ICD-10-CM | POA: Diagnosis present

## 2019-07-05 LAB — SARS CORONAVIRUS 2 (TAT 6-24 HRS): SARS Coronavirus 2: NEGATIVE

## 2019-07-07 ENCOUNTER — Ambulatory Visit: Payer: Medicare Other | Admitting: Anesthesiology

## 2019-07-07 ENCOUNTER — Ambulatory Visit
Admission: RE | Admit: 2019-07-07 | Discharge: 2019-07-07 | Disposition: A | Discharge status: Home / Self Care | Payer: Medicare Other | Attending: Internal Medicine | Admitting: Internal Medicine

## 2019-07-07 ENCOUNTER — Encounter: Payer: Self-pay | Admitting: *Deleted

## 2019-07-07 ENCOUNTER — Encounter
Admission: RE | Disposition: A | Discharge status: Home / Self Care | Payer: Self-pay | Source: Home / Self Care | Attending: Internal Medicine

## 2019-07-07 DIAGNOSIS — R1312 Dysphagia, oropharyngeal phase: Secondary | ICD-10-CM | POA: Diagnosis not present

## 2019-07-07 DIAGNOSIS — J449 Chronic obstructive pulmonary disease, unspecified: Secondary | ICD-10-CM | POA: Insufficient documentation

## 2019-07-07 DIAGNOSIS — Z9221 Personal history of antineoplastic chemotherapy: Secondary | ICD-10-CM | POA: Diagnosis not present

## 2019-07-07 DIAGNOSIS — Z8719 Personal history of other diseases of the digestive system: Secondary | ICD-10-CM | POA: Diagnosis not present

## 2019-07-07 DIAGNOSIS — M797 Fibromyalgia: Secondary | ICD-10-CM | POA: Diagnosis not present

## 2019-07-07 DIAGNOSIS — M329 Systemic lupus erythematosus, unspecified: Secondary | ICD-10-CM | POA: Diagnosis not present

## 2019-07-07 DIAGNOSIS — Z923 Personal history of irradiation: Secondary | ICD-10-CM | POA: Insufficient documentation

## 2019-07-07 DIAGNOSIS — Z7951 Long term (current) use of inhaled steroids: Secondary | ICD-10-CM | POA: Insufficient documentation

## 2019-07-07 DIAGNOSIS — R630 Anorexia: Secondary | ICD-10-CM | POA: Diagnosis not present

## 2019-07-07 DIAGNOSIS — R1032 Left lower quadrant pain: Secondary | ICD-10-CM | POA: Insufficient documentation

## 2019-07-07 DIAGNOSIS — K2970 Gastritis, unspecified, without bleeding: Secondary | ICD-10-CM | POA: Insufficient documentation

## 2019-07-07 DIAGNOSIS — Z7901 Long term (current) use of anticoagulants: Secondary | ICD-10-CM | POA: Insufficient documentation

## 2019-07-07 DIAGNOSIS — K2100 Gastro-esophageal reflux disease with esophagitis, without bleeding: Secondary | ICD-10-CM | POA: Insufficient documentation

## 2019-07-07 DIAGNOSIS — K5730 Diverticulosis of large intestine without perforation or abscess without bleeding: Secondary | ICD-10-CM | POA: Insufficient documentation

## 2019-07-07 DIAGNOSIS — G35 Multiple sclerosis: Secondary | ICD-10-CM | POA: Diagnosis not present

## 2019-07-07 DIAGNOSIS — K641 Second degree hemorrhoids: Secondary | ICD-10-CM | POA: Diagnosis not present

## 2019-07-07 DIAGNOSIS — Z79899 Other long term (current) drug therapy: Secondary | ICD-10-CM | POA: Insufficient documentation

## 2019-07-07 DIAGNOSIS — M069 Rheumatoid arthritis, unspecified: Secondary | ICD-10-CM | POA: Diagnosis not present

## 2019-07-07 DIAGNOSIS — D0592 Unspecified type of carcinoma in situ of left breast: Secondary | ICD-10-CM | POA: Insufficient documentation

## 2019-07-07 DIAGNOSIS — R6881 Early satiety: Secondary | ICD-10-CM | POA: Diagnosis not present

## 2019-07-07 DIAGNOSIS — Z79811 Long term (current) use of aromatase inhibitors: Secondary | ICD-10-CM | POA: Insufficient documentation

## 2019-07-07 DIAGNOSIS — R569 Unspecified convulsions: Secondary | ICD-10-CM | POA: Diagnosis not present

## 2019-07-07 HISTORY — PX: COLONOSCOPY WITH PROPOFOL: SHX5780

## 2019-07-07 HISTORY — PX: ESOPHAGOGASTRODUODENOSCOPY (EGD) WITH PROPOFOL: SHX5813

## 2019-07-07 SURGERY — ESOPHAGOGASTRODUODENOSCOPY (EGD) WITH PROPOFOL
Anesthesia: General

## 2019-07-07 MED ORDER — HEPARIN SOD (PORK) LOCK FLUSH 100 UNIT/ML IV SOLN
INTRAVENOUS | Status: AC
  Filled 2019-07-07: qty 5

## 2019-07-07 MED ORDER — PROPOFOL 10 MG/ML IV BOLUS
INTRAVENOUS | Status: DC | PRN
  Administered 2019-07-07: 80 mg via INTRAVENOUS
  Administered 2019-07-07: 22 mg via INTRAVENOUS
  Administered 2019-07-07: 14:00:00
  Administered 2019-07-07 (×2): 22 mg via INTRAVENOUS

## 2019-07-07 MED ORDER — PROPOFOL 500 MG/50ML IV EMUL
INTRAVENOUS | Status: AC
  Filled 2019-07-07: qty 50

## 2019-07-07 MED ORDER — PROPOFOL 500 MG/50ML IV EMUL
INTRAVENOUS | Status: DC | PRN
  Administered 2019-07-07: 130 ug/kg/min via INTRAVENOUS

## 2019-07-07 MED ORDER — SODIUM CHLORIDE 0.9 % IV SOLN
INTRAVENOUS | Status: DC
  Administered 2019-07-07: 1000 mL via INTRAVENOUS

## 2019-07-07 NOTE — Anesthesia Post-op Follow-up Note (Signed)
Anesthesia QCDR form completed.        

## 2019-07-07 NOTE — H&P (Signed)
Outpatient short stay form Pre-procedure 07/07/2019 12:52 PM Victoria [name] K. Alice Reichert, M.D.  Primary Physician: Frazier Richards, M.D.  Reason for visit:  GERD, LLQ pain, f/u diverticulitis  History of present illness:  As above. Patient with multiple complaints including in addition---Early satiety, loss of appetite (No weight loss), Nondescript oropharyngeal dysphagia. Diffuse pain, though greater on the left than right side. Some minimal amount of anal outlet bleeding.     Current Facility-Administered Medications:  .  0.9 %  sodium chloride infusion, , Intravenous, Continuous, [place], Benay Pike, MD, Last Rate: 20 mL/hr at 07/07/19 1244  Facility-Administered Medications Ordered in Other Encounters:  .  sodium chloride flush (NS) 0.9 % injection 10 mL, 10 mL, Intravenous, PRN, Earlie Server, MD, 10 mL at 01/22/19 1503  Medications Prior to Admission  Medication Sig Dispense Refill Last Dose  . albuterol (PROVENTIL HFA;VENTOLIN HFA) 108 (90 Base) MCG/ACT inhaler Inhale 2 puffs into the lungs every 6 (six) hours as needed for wheezing or shortness of breath. 1 Inhaler 2 Past Week at Unknown time  . budesonide-formoterol (SYMBICORT) 160-4.5 MCG/ACT inhaler Inhale 2 puffs into the lungs 2 (two) times daily. 1 Inhaler 12 07/06/2019 at Unknown time  . atorvastatin (LIPITOR) 40 MG tablet Take 40 mg by mouth daily.     . benzonatate (TESSALON) 100 MG capsule Take 1 capsule (100 mg total) by mouth 3 (three) times daily as needed. 30 capsule 0   . cyclobenzaprine (FLEXERIL) 10 MG tablet TAKE 1 TABLET BY MOUTH TWICE A DAY AS NEEDED FOR MUSCLE SPASMS 40 tablet 0   . dabigatran (PRADAXA) 150 MG CAPS capsule Take 1 capsule (150 mg total) by mouth 2 (two) times daily. 180 capsule 3   . gabapentin (NEURONTIN) 300 MG capsule Take 600 mg by mouth 3 (three) times daily.     Marland Kitchen HYDROcodone-acetaminophen (NORCO) 7.5-325 MG tablet Take 1 tablet by mouth 2 (two) times daily as needed for moderate pain. 45 tablet 0   .  hydroxychloroquine (PLAQUENIL) 200 MG tablet Take 200 mg by mouth daily.     Marland Kitchen ibuprofen (ADVIL) 600 MG tablet      . leflunomide (ARAVA) 20 MG tablet Take 20 mg by mouth daily.     Marland Kitchen letrozole (FEMARA) 2.5 MG tablet Take 1 tablet (2.5 mg total) by mouth daily. 30 tablet 3   . omeprazole (PRILOSEC) 40 MG capsule Take 1 capsule (40 mg total) by mouth 2 (two) times daily. 90 capsule 1   . ondansetron (ZOFRAN-ODT) 8 MG disintegrating tablet Take by mouth.     . potassium chloride (K-DUR) 10 MEQ tablet Take 10 mEq by mouth daily.     . Tiotropium Bromide Monohydrate (SPIRIVA RESPIMAT) 2.5 MCG/ACT AERS Inhale 2 puffs into the lungs every morning. 1 Inhaler 11   . Topiramate (TOPAMAX PO) Take 50 mg by mouth 2 (two) times daily.      Marland Kitchen torsemide (DEMADEX) 10 MG tablet      . traZODone (DESYREL) 50 MG tablet Take 3 tablets (150 mg total) by mouth at bedtime. 180 tablet 1      Allergies  Allergen Reactions  . Sodium Hypochlorite Other (See Comments)    Seizure  . Codeine   . Flagyl [Metronidazole]   . Sulfa Antibiotics      Past Medical History:  Diagnosis Date  . Asthma   . Cancer ([place])    Breast Left, DCIS 2018  . Collagen vascular disease (HCC)    Lupus  . COPD (chronic obstructive  pulmonary disease) ([place])   . Diverticulitis   . Fibromyalgia   . GERD (gastroesophageal reflux disease)   . Hearing impaired person, left   . IBS (irritable bowel syndrome)   . Lupus ([place])   . Mitral valve disorder   . MS (multiple sclerosis) ([place])   . Neuromuscular disorder ([place])   . Osteoarthritis   . Osteoporosis 05/12/2019  . Personal history of chemotherapy   . Personal history of radiation therapy 2017   left   . Rheumatoid arthritis ([place])   . Seizures ([place])     Review of systems:  Otherwise negative.    Physical Exam  Gen: Alert, oriented. Appears stated age.  HEENT: [place]/AT. PERRLA. Lungs: CTA, no wheezes. CV: RR nl S1, S2. Abd: soft, benign, no masses. BS+ Ext: No edema. Pulses  2+    Planned procedures: Proceed with EGD and colonoscopy. The patient understands the nature of the planned procedure, indications, risks, alternatives and potential complications including but not limited to bleeding, infection, perforation, damage to internal organs and possible oversedation/side effects from anesthesia. The patient agrees and gives consent to proceed.  Please refer to procedure notes for findings, recommendations and patient disposition/instructions.     [name] K. Alice Reichert, M.D. Gastroenterology 07/07/2019  12:52 PM

## 2019-07-07 NOTE — Interval H&P Note (Signed)
History and Physical Interval Note:  07/07/2019 12:54 PM  Victoria [name]  has presented today for surgery, with the diagnosis of GERD,DIARRHEA,CHRONIC LLQ PAIN,HX.OF DIVERTICULITIS.  The various methods of treatment have been discussed with the patient and family. After consideration of risks, benefits and other options for treatment, the patient has consented to  Procedure(s): ESOPHAGOGASTRODUODENOSCOPY (EGD) WITH PROPOFOL (N/A) COLONOSCOPY WITH PROPOFOL (N/A) as a surgical intervention.  The patient's history has been reviewed, patient examined, no change in status, stable for surgery.  I have reviewed the patient's chart and labs.  Questions were answered to the patient's satisfaction.     [place], [place]

## 2019-07-07 NOTE — Anesthesia Postprocedure Evaluation (Signed)
Anesthesia Post Note  Patient: Victoria [name]  Procedure(s) Performed: ESOPHAGOGASTRODUODENOSCOPY (EGD) WITH PROPOFOL (N/A ) COLONOSCOPY WITH PROPOFOL (N/A )  Patient location during evaluation: PACU Anesthesia Type: General Level of consciousness: awake and alert Pain management: pain level controlled Vital Signs Assessment: post-procedure vital signs reviewed and stable Respiratory status: spontaneous breathing, nonlabored ventilation and respiratory function stable Cardiovascular status: blood pressure returned to baseline and stable Postop Assessment: no apparent nausea or vomiting Anesthetic complications: no     Last Vitals:  Vitals:   07/07/19 1343 07/07/19 1352  BP: (!) 109/58 (!) 116/50  Pulse: 71   Resp: (!) 29   Temp:    SpO2:      Last Pain:  Vitals:   07/07/19 1352  TempSrc:   PainSc: 0-No pain                 Durenda Hurt

## 2019-07-07 NOTE — Op Note (Signed)
[facility] Gastroenterology Patient Name: Victoria [name] Procedure Date: 07/07/2019 12:14 PM MRN: [id number] Account #: [id number] Date of Birth: 03-Apr-1961 Admit Type: Outpatient Age: 58 Room: [facility] ENDO ROOM 3 Gender: Female Note Status: Finalized Procedure:             Colonoscopy Indications:           Abdominal pain in the left lower quadrant, Follow-up                         of diverticulitis Providers:             Benay Pike. Staphanie Harbison MD, MD Medicines:             Propofol per Anesthesia Complications:         No immediate complications. Procedure:             Pre-Anesthesia Assessment:                        - The risks and benefits of the procedure and the                         sedation options and risks were discussed with the                         patient. All questions were answered and informed                         consent was obtained.                        - Patient identification and proposed procedure were                         verified prior to the procedure by the nurse. The                         procedure was verified in the procedure room.                        - ASA Grade Assessment: III - A patient with severe                         systemic disease.                        - After reviewing the risks and benefits, the patient                         was deemed in satisfactory condition to undergo the                         procedure.                        After obtaining informed consent, the colonoscope was                         passed under direct vision. Throughout the procedure,  the patient's blood pressure, pulse, and oxygen                         saturations were monitored continuously. The                         Colonoscope was introduced through the anus and                         advanced to the the cecum, identified by appendiceal                         orifice and ileocecal valve. The  colonoscopy was                         performed without difficulty. The patient tolerated                         the procedure well. The quality of the bowel                         preparation was good. The ileocecal valve, appendiceal                         orifice, and rectum were photographed. Findings:      The perianal and digital rectal examinations were normal. Pertinent       negatives include normal sphincter tone and no palpable rectal lesions.      Non-bleeding internal hemorrhoids were found during retroflexion. The       hemorrhoids were Grade II (internal hemorrhoids that prolapse but reduce       spontaneously).      Multiple small and large-mouthed diverticula were found in the sigmoid       colon.      Segmental mild mucosal changes characterized by friability were found in       the distal rectum. Biopsies were taken with a cold forceps for histology.      The exam was otherwise without abnormality on direct and retroflexion       views. Impression:            - Non-bleeding internal hemorrhoids.                        - Diverticulosis in the sigmoid colon.                        - Segmental mild mucosal changes were found in the                         distal rectum secondary to proctitis. Biopsied.                        - The examination was otherwise normal on direct and                         retroflexion views. Recommendation:        - Await pathology results from EGD, also performed  today.                        - Patient has a contact number available for                         emergencies. The signs and symptoms of potential                         delayed complications were discussed with the patient.                         Return to normal activities tomorrow. Written                         discharge instructions were provided to the patient.                        - Resume previous diet.                        - Continue  present medications.                        - Await pathology results.                        - Repeat colonoscopy in 10 years for screening                         purposes.                        - Return to physician assistant in 6 weeks.                        - Please follow up with Tammi Klippel, PA-C in [ ]                          months.                        - The findings and recommendations were discussed with                         the patient. Procedure Code(s):     --- Professional ---                        [phone], Colonoscopy, flexible; with biopsy, single or                         multiple Diagnosis Code(s):     --- Professional ---                        K57.30, Diverticulosis of large intestine without                         perforation or abscess without bleeding                        K57.32, Diverticulitis of large intestine without  perforation or abscess without bleeding                        R10.32, Left lower quadrant pain                        K62.89, Other specified diseases of anus and rectum                        K64.1, Second degree hemorrhoids CPT copyright 2019 American Medical Association. All rights reserved. The codes documented in this report are preliminary and upon coder review may  be revised to meet current compliance requirements. Efrain Sella MD, MD 07/07/2019 1:23:13 PM This report has been signed electronically. Number of Addenda: 0 Note Initiated On: 07/07/2019 12:14 PM Scope Withdrawal Time: 0 hours 7 minutes 10 seconds  Total Procedure Duration: 0 hours 12 minutes 7 seconds  Estimated Blood Loss:  Estimated blood loss: none.      [facility]

## 2019-07-07 NOTE — Op Note (Signed)
[facility] Gastroenterology Patient Name: Victoria [name] Procedure Date: 07/07/2019 12:14 PM MRN: [id number] Account #: [id number] Date of Birth: Nov 19, 1960 Admit Type: Outpatient Age: 58 Room: [facility] ENDO ROOM 3 Gender: Female Note Status: Finalized Procedure:             Upper GI endoscopy Indications:           Abdominal pain in the left lower quadrant, Esophageal                         reflux Providers:             Benay Pike. [place] MD, MD Medicines:             Propofol per Anesthesia Complications:         No immediate complications. Procedure:             Pre-Anesthesia Assessment:                        - The risks and benefits of the procedure and the                         sedation options and risks were discussed with the                         patient. All questions were answered and informed                         consent was obtained.                        - Patient identification and proposed procedure were                         verified prior to the procedure by the nurse. The                         procedure was verified in the procedure room.                        - ASA Grade Assessment: III - A patient with severe                         systemic disease.                        - After reviewing the risks and benefits, the patient                         was deemed in satisfactory condition to undergo the                         procedure.                        After obtaining informed consent, the endoscope was                         passed under direct vision. Throughout the procedure,  the patient's blood pressure, pulse, and oxygen                         saturations were monitored continuously. The Endoscope                         was introduced through the mouth, and advanced to the                         third part of duodenum. The upper GI endoscopy was                         accomplished without  difficulty. The patient tolerated                         the procedure well. Findings:      The Z-line was irregular and was found at the gastroesophageal junction.       Mucosa was biopsied with a cold forceps for histology. One specimen       bottle was sent to pathology.      There is no endoscopic evidence of stenosis, stricture, ulcerations,       mass or diverticula in the entire esophagus.      Localized minimal inflammation characterized by erythema was found in       the gastric antrum. Biopsies were taken with a cold forceps for       Helicobacter pylori testing.      The cardia and gastric fundus were normal on retroflexion.      The examined duodenum was normal.      The exam was otherwise without abnormality. Impression:            - Z-line irregular, at the gastroesophageal junction.                         Biopsied.                        - Gastritis. Biopsied.                        - Normal examined duodenum.                        - The examination was otherwise normal. Recommendation:        - Await pathology results.                        - Proceed with colonoscopy Procedure Code(s):     --- Professional ---                        [phone], Esophagogastroduodenoscopy, flexible,                         transoral; with biopsy, single or multiple Diagnosis Code(s):     --- Professional ---                        K21.9, Gastro-esophageal reflux disease without                         esophagitis  R10.32, Left lower quadrant pain                        K29.70, Gastritis, unspecified, without bleeding                        K22.8, Other specified diseases of esophagus CPT copyright 2019 American Medical Association. All rights reserved. The codes documented in this report are preliminary and upon coder review may  be revised to meet current compliance requirements. Efrain Sella MD, MD 07/07/2019 1:05:29 PM This report has been signed  electronically. Number of Addenda: 0 Note Initiated On: 07/07/2019 12:14 PM Estimated Blood Loss:  Estimated blood loss: none.      [facility]

## 2019-07-07 NOTE — Transfer of Care (Signed)
Immediate Anesthesia Transfer of Care Note  Patient: Victoria [name]  Procedure(s) Performed: ESOPHAGOGASTRODUODENOSCOPY (EGD) WITH PROPOFOL (N/A ) COLONOSCOPY WITH PROPOFOL (N/A )  Patient Location: PACU and Endoscopy Unit  Anesthesia Type:General  Level of Consciousness: drowsy  Airway & Oxygen Therapy: Patient Spontanous Breathing  Post-op Assessment: Report given to RN and Post -op Vital signs reviewed and stable  Post vital signs: Reviewed and stable  Last Vitals: see nursing vital sign flow sheet Vitals Value Taken Time  BP    Temp    Pulse    Resp    SpO2      Last Pain:  Vitals:   07/07/19 1223  TempSrc: Temporal  PainSc: 0-No pain         Complications: No apparent anesthesia complications

## 2019-07-07 NOTE — Anesthesia Preprocedure Evaluation (Addendum)
Anesthesia Evaluation  Patient identified by MRN, date of birth, ID band Patient awake    Reviewed: Allergy & Precautions, H&P , NPO status , Patient's Chart, lab work & pertinent test results  Airway Mallampati: II  TM Distance: <3 FB Neck ROM: limited    Dental   Pulmonary shortness of breath, asthma , COPD, former smoker,           Cardiovascular negative cardio ROS       Neuro/Psych  Headaches, Seizures -,  negative psych ROS   GI/Hepatic Neg liver ROS, GERD  Controlled,  Endo/Other    Renal/GU negative Renal ROS  negative genitourinary   Musculoskeletal  (+) Arthritis , Fibromyalgia -  Abdominal   Peds  Hematology negative hematology ROS (+)   Anesthesia Other Findings Obesity  Past Medical History: No date: Asthma No date: Cancer [facility])     Comment:  Breast Left, DCIS 2018 No date: Collagen vascular disease (HCC)     Comment:  Lupus No date: COPD (chronic obstructive pulmonary disease) (HCC) No date: Diverticulitis No date: Fibromyalgia No date: GERD (gastroesophageal reflux disease) No date: Hearing impaired person, left No date: IBS (irritable bowel syndrome) No date: Lupus ([place]) No date: Mitral valve disorder No date: MS (multiple sclerosis) ([place]) No date: Neuromuscular disorder ([place]) No date: Osteoarthritis 05/12/2019: Osteoporosis No date: Personal history of chemotherapy 2017: Personal history of radiation therapy     Comment:  left  No date: Rheumatoid arthritis ([place]) No date: Seizures ([place])  Past Surgical History: No date: ABDOMINAL HYSTERECTOMY No date: APPENDECTOMY 2017: BREAST EXCISIONAL BIOPSY; Left     Comment:  lumpectomy with radation 2004: BREAST EXCISIONAL BIOPSY; Left     Comment:  neg surgical bx  No date: BREAST SURGERY No date: CHOLECYSTECTOMY No date: PATENT DUCTUS ARTERIOUS REPAIR No date: RECONSTRUCTION TENDON PULLEY HAND; Left No date: TOTAL HIP ARTHROPLASTY;  Right     Reproductive/Obstetrics negative OB ROS                            Anesthesia Physical Anesthesia Plan  ASA: III  Anesthesia Plan: General   Post-op Pain Management:    Induction:   PONV Risk Score and Plan: Propofol infusion and TIVA  Airway Management Planned:   Additional Equipment:   Intra-op Plan:   Post-operative Plan:   Informed Consent: I have reviewed the patients History and Physical, chart, labs and discussed the procedure including the risks, benefits and alternatives for the proposed anesthesia with the patient or authorized representative who has indicated his/her understanding and acceptance.     Dental Advisory Given  Plan Discussed with: Anesthesiologist  Anesthesia Plan Comments:         Anesthesia Quick Evaluation

## 2019-07-08 ENCOUNTER — Encounter: Payer: Self-pay | Admitting: Internal Medicine

## 2019-07-08 LAB — SURGICAL PATHOLOGY

## 2019-07-14 ENCOUNTER — Ambulatory Visit: Payer: Medicare Other | Admitting: Gastroenterology

## 2019-07-14 ENCOUNTER — Encounter

## 2019-07-15 ENCOUNTER — Encounter: Payer: Self-pay | Admitting: Student in an Organized Health Care Education/Training Program

## 2019-07-15 ENCOUNTER — Other Ambulatory Visit: Payer: Self-pay

## 2019-07-15 ENCOUNTER — Ambulatory Visit
Payer: Medicare Other | Attending: Student in an Organized Health Care Education/Training Program | Admitting: Student in an Organized Health Care Education/Training Program

## 2019-07-15 ENCOUNTER — Other Ambulatory Visit: Payer: Self-pay | Admitting: Family Medicine

## 2019-07-15 DIAGNOSIS — M1611 Unilateral primary osteoarthritis, right hip: Secondary | ICD-10-CM

## 2019-07-15 DIAGNOSIS — M2550 Pain in unspecified joint: Secondary | ICD-10-CM

## 2019-07-15 DIAGNOSIS — M797 Fibromyalgia: Secondary | ICD-10-CM | POA: Diagnosis not present

## 2019-07-15 DIAGNOSIS — J449 Chronic obstructive pulmonary disease, unspecified: Secondary | ICD-10-CM

## 2019-07-15 DIAGNOSIS — M1380 Other specified arthritis, unspecified site: Secondary | ICD-10-CM | POA: Diagnosis not present

## 2019-07-15 DIAGNOSIS — G894 Chronic pain syndrome: Secondary | ICD-10-CM

## 2019-07-15 DIAGNOSIS — M5416 Radiculopathy, lumbar region: Secondary | ICD-10-CM | POA: Diagnosis not present

## 2019-07-15 MED ORDER — HYDROCODONE-ACETAMINOPHEN 7.5-325 MG PO TABS: 1.0000 | ORAL_TABLET | Freq: Two times a day (BID) | ORAL | 0 refills | Status: DC | PRN

## 2019-07-15 NOTE — Progress Notes (Signed)
Pain Management Virtual Encounter Note - Virtual Visit via [place] (real-time audio visits between healthcare provider and patient).   Patient's Phone No. & Preferred Pharmacy:  [phone] (home); [phone] (mobile); (Preferred) [phone] [email or web address]  [place], [place] [address] Phone: [phone] Fax: [phone]    Pre-screening note:  Our staff contacted Ms. Heffington and offered her an "in person", "face-to-face" appointment versus a telephone encounter. She indicated preferring the telephone encounter, at this time.   Reason for Virtual Visit: COVID-19*  Social distancing based on CDC and AMA recommendations.   I contacted [place] Blas on 07/15/2019 via video conference.      I clearly identified myself as Gillis Santa, MD. I verified that I was speaking with the correct person using two identifiers (Name: Victoria [name], and date of birth: [birth year]).  Advanced Informed Consent I sought verbal advanced consent from [place] Blas for virtual visit interactions. I informed Ms. Paine of possible security and privacy concerns, risks, and limitations associated with providing "not-in-person" medical evaluation and management services. I also informed Ms. Vanbramer of the availability of "in-person" appointments. Finally, I informed her that there would be a charge for the virtual visit and that she could be  personally, fully or partially, financially responsible for it. Ms. Graydon expressed understanding and agreed to proceed.   Historic Elements   Ms. Yoandra Pietila is a 58 y.o. year old, female patient evaluated today after her last encounter by our practice on 05/20/2019. Ms. Tornetta  has a past medical history of Asthma, Cancer ([place]), Collagen vascular disease ([place]), COPD (chronic obstructive pulmonary disease) ([place]), Diverticulitis, Fibromyalgia, GERD (gastroesophageal  reflux disease), Hearing impaired person, left, IBS (irritable bowel syndrome), Lupus ([place]), Mitral valve disorder, MS (multiple sclerosis) ([place]), Neuromuscular disorder ([place]), Osteoarthritis, Osteoporosis (05/12/2019), Personal history of chemotherapy, Personal history of radiation therapy (2017), Rheumatoid arthritis ([place]), and Seizures ([place]). She also  has a past surgical history that includes Breast surgery; Abdominal hysterectomy; Appendectomy; Cholecystectomy; Patent ductus arterious repair; Total hip arthroplasty (Right); Reconstruction tendon pulley hand (Left); Breast excisional biopsy (Left, 2017); Breast excisional biopsy (Left, 2004); Esophagogastroduodenoscopy (egd) with propofol (N/A, 07/07/2019); and Colonoscopy with propofol (N/A, 07/07/2019). Ms. Seiffert has a current medication list which includes the following prescription(s): albuterol, atorvastatin, budesonide-formoterol, cyclobenzaprine, dabigatran, gabapentin, hydrocodone-acetaminophen, hydrocodone-acetaminophen, hydrocodone-acetaminophen, hydroxychloroquine, leflunomide, letrozole, omeprazole, ondansetron, potassium chloride, tiotropium bromide monohydrate, topiramate, torsemide, trazodone, benzonatate, and ibuprofen, and the following Facility-Administered Medications: sodium chloride flush. She  reports that she quit smoking about 20 years ago. Her smoking use included cigarettes. She has a 30.00 pack-year smoking history. She has never used smokeless tobacco. She reports current alcohol use. She reports that she does not use drugs. Ms. Crozier is allergic to sodium hypochlorite; codeine; flagyl [metronidazole]; and sulfa antibiotics.   HPI  Today, she is being contacted for medication management.  No change in medical history since last visit, awaiting biopsy results from EGD at end of November .  Patient's pain is at baseline.  Patient continues multimodal pain regimen as prescribed.  States that it provides pain relief and  improvement in functional status. No side effects noted.   Pharmacotherapy Assessment  Analgesic: 06/26/2019  1   05/20/2019  Hydrocodone-Acetamin 7.5-325  45.00  23 Bi Lat   [id number]   Gib (4800)   0  14.67 MME  Medicare   [place]     Monitoring: Pharmacotherapy: No side-effects or adverse reactions reported. [place] PMP: PDMP reviewed during this  encounter.       Compliance: No problems identified. Effectiveness: Clinically acceptable. Plan: Refer to "POC".  UDS:  Summary  Date Value Ref Range Status  10/21/2018 FINAL  Final    Comment:    ==================================================================== TOXASSURE COMP DRUG ANALYSIS,UR ==================================================================== Test                             Result       Flag       Units Drug Present   Gabapentin                     PRESENT   Topiramate                     PRESENT   Cyclobenzaprine                PRESENT   Desmethylcyclobenzaprine       PRESENT    Desmethylcyclobenzaprine is an expected metabolite of    cyclobenzaprine.   Trazodone                      PRESENT   1,3 chlorophenyl piperazine    PRESENT    1,3-chlorophenyl piperazine is an expected metabolite of    trazodone. ==================================================================== Test                      Result    Flag   Units      Ref Range   Creatinine              127              mg/dL      >=20 ==================================================================== Declared Medications:  Medication list was not provided. ==================================================================== For clinical consultation, please call [phone]. ====================================================================    Laboratory Chemistry Profile (12 mo)  Renal: 05/16/2019: BUN 13; Creatinine, Ser 0.90  Lab Results  Component Value Date   GFRAA >60 05/16/2019   GFRNONAA >60 05/16/2019   Hepatic: 05/16/2019: Albumin  4.0 Lab Results  Component Value Date   AST 23 05/16/2019   ALT 24 05/16/2019   Other: 09/12/2018: CRP <0.8; Sed Rate 16 02/11/2019: Vitamin B-12 397 Note: Above Lab results reviewed.  Imaging  CT ABDOMEN PELVIS W CONTRAST CLINICAL DATA:  58 year old female with chronic left lower quadrant abdominal pain.  EXAM: CT ABDOMEN AND PELVIS WITH CONTRAST  TECHNIQUE: Multidetector CT imaging of the abdomen and pelvis was performed using the standard protocol following bolus administration of intravenous contrast.  CONTRAST:  16mL OMNIPAQUE IOHEXOL 300 MG/ML  SOLN  COMPARISON:  CT of the abdomen pelvis dated 04/03/2019  FINDINGS: Lower chest: The visualized lung bases are clear. Partially visualized central venous line with tip in the right atrium.  No intra-abdominal free air or free fluid.  Hepatobiliary: The liver is unremarkable. No intrahepatic biliary ductal dilatation. Cholecystectomy.  Pancreas: Unremarkable. No pancreatic ductal dilatation or surrounding inflammatory changes.  Spleen: Normal in size without focal abnormality.  Adrenals/Urinary Tract: The adrenal glands are unremarkable. There is no hydronephrosis on either side. There is symmetric enhancement and excretion of contrast by both kidneys. The visualized ureters appear unremarkable. The urinary bladder is suboptimally visualized due to streak artifact caused by right hip arthroplasty.  Stomach/Bowel: Sigmoid diverticulosis with muscular hypertrophy. No active inflammatory changes. There is moderate stool throughout the colon. There is no bowel obstruction or active inflammation. Appendectomy.  Vascular/Lymphatic: The abdominal aorta is unremarkable. An IVC filter noted in similar position. No portal venous gas. There is no adenopathy.  Reproductive: Hysterectomy. No adnexal masses.  Other: None  Musculoskeletal: Total right hip arthroplasty. No acute osseous pathology.  IMPRESSION: 1. No acute  intra-abdominal or pelvic pathology. 2. Moderate colonic stool burden. No bowel obstruction or active inflammation. 3. Sigmoid diverticulosis.  Electronically Signed   By: Anner Crete M.D.   On: 05/21/2019 22:11   Assessment  The primary encounter diagnosis was Lumbar radiculopathy. Diagnoses of Fibromyalgia, Polyarthralgia, Seronegative arthritis, Primary osteoarthritis of right hip, and Chronic pain syndrome were also pertinent to this visit.  Plan of Care   I am having [place] Blas start on HYDROcodone-acetaminophen and HYDROcodone-acetaminophen. I am also having her maintain her budesonide-formoterol, albuterol, traZODone, dabigatran, Tiotropium Bromide Monohydrate, leflunomide, benzonatate, hydroxychloroquine, omeprazole, cyclobenzaprine, Topiramate (TOPAMAX PO), gabapentin, potassium chloride, atorvastatin, torsemide, ibuprofen, ondansetron, letrozole, and HYDROcodone-acetaminophen.  Pharmacotherapy (Medications Ordered): Meds ordered this encounter  Medications  . HYDROcodone-acetaminophen ([place]) 7.5-325 MG tablet    Sig: Take 1 tablet by mouth 2 (two) times daily as needed for moderate pain.    Dispense:  45 tablet    Refill:  0  . HYDROcodone-acetaminophen ([place]) 7.5-325 MG tablet    Sig: Take 1 tablet by mouth 2 (two) times daily as needed for moderate pain.    Dispense:  45 tablet    Refill:  0  . HYDROcodone-acetaminophen ([place]) 7.5-325 MG tablet    Sig: Take 1 tablet by mouth 2 (two) times daily as needed for moderate pain.    Dispense:  45 tablet    Refill:  0   Follow-up plan:   Return in about 3 months (around 10/13/2019) for Medication Management, in person, (UDS).    Recent Visits Date Type Provider Dept  05/20/19 Office Visit Gillis Santa, MD [facility]  Showing recent visits within past 90 days and meeting all other requirements   Today's Visits Date Type Provider Dept  07/15/19 Office Visit Gillis Santa, MD [facility]   Showing today's visits and meeting all other requirements   Future Appointments No visits were found meeting these conditions.  Showing future appointments within next 90 days and meeting all other requirements   I discussed the assessment and treatment plan with the patient. The patient was provided an opportunity to ask questions and all were answered. The patient agreed with the plan and demonstrated an understanding of the instructions.  Patient advised to call back or seek an in-person evaluation if the symptoms or condition worsens.  Total duration of non-face-to-face encounter: 25 minutes.  Note by: Gillis Santa, MD Date: 07/15/2019; Time: 8:59 AM  Note: This dictation was prepared with Dragon dictation. Any transcriptional errors that may result from this process are unintentional.  Disclaimer:  * Given the special circumstances of the COVID-19 pandemic, the federal government has announced that the Office for Civil Rights (OCR) will exercise its enforcement discretion and will not impose penalties on physicians using telehealth in the event of noncompliance with regulatory requirements under the [place] and [place] (HIPAA) in connection with the good faith provision of telehealth during the XX123456 national public health emergency. ([place])

## 2019-07-16 IMAGING — DX DG HIP (WITH OR WITHOUT PELVIS) 2-3V*L*
3 series · 3 of 3 positions shown · non-contrast
Comparison: None.

CLINICAL DATA: Left hip pain, no known injury, initial encounter

EXAM:
DG HIP (WITH OR WITHOUT PELVIS) 2-3V LEFT

[pelvis ap]
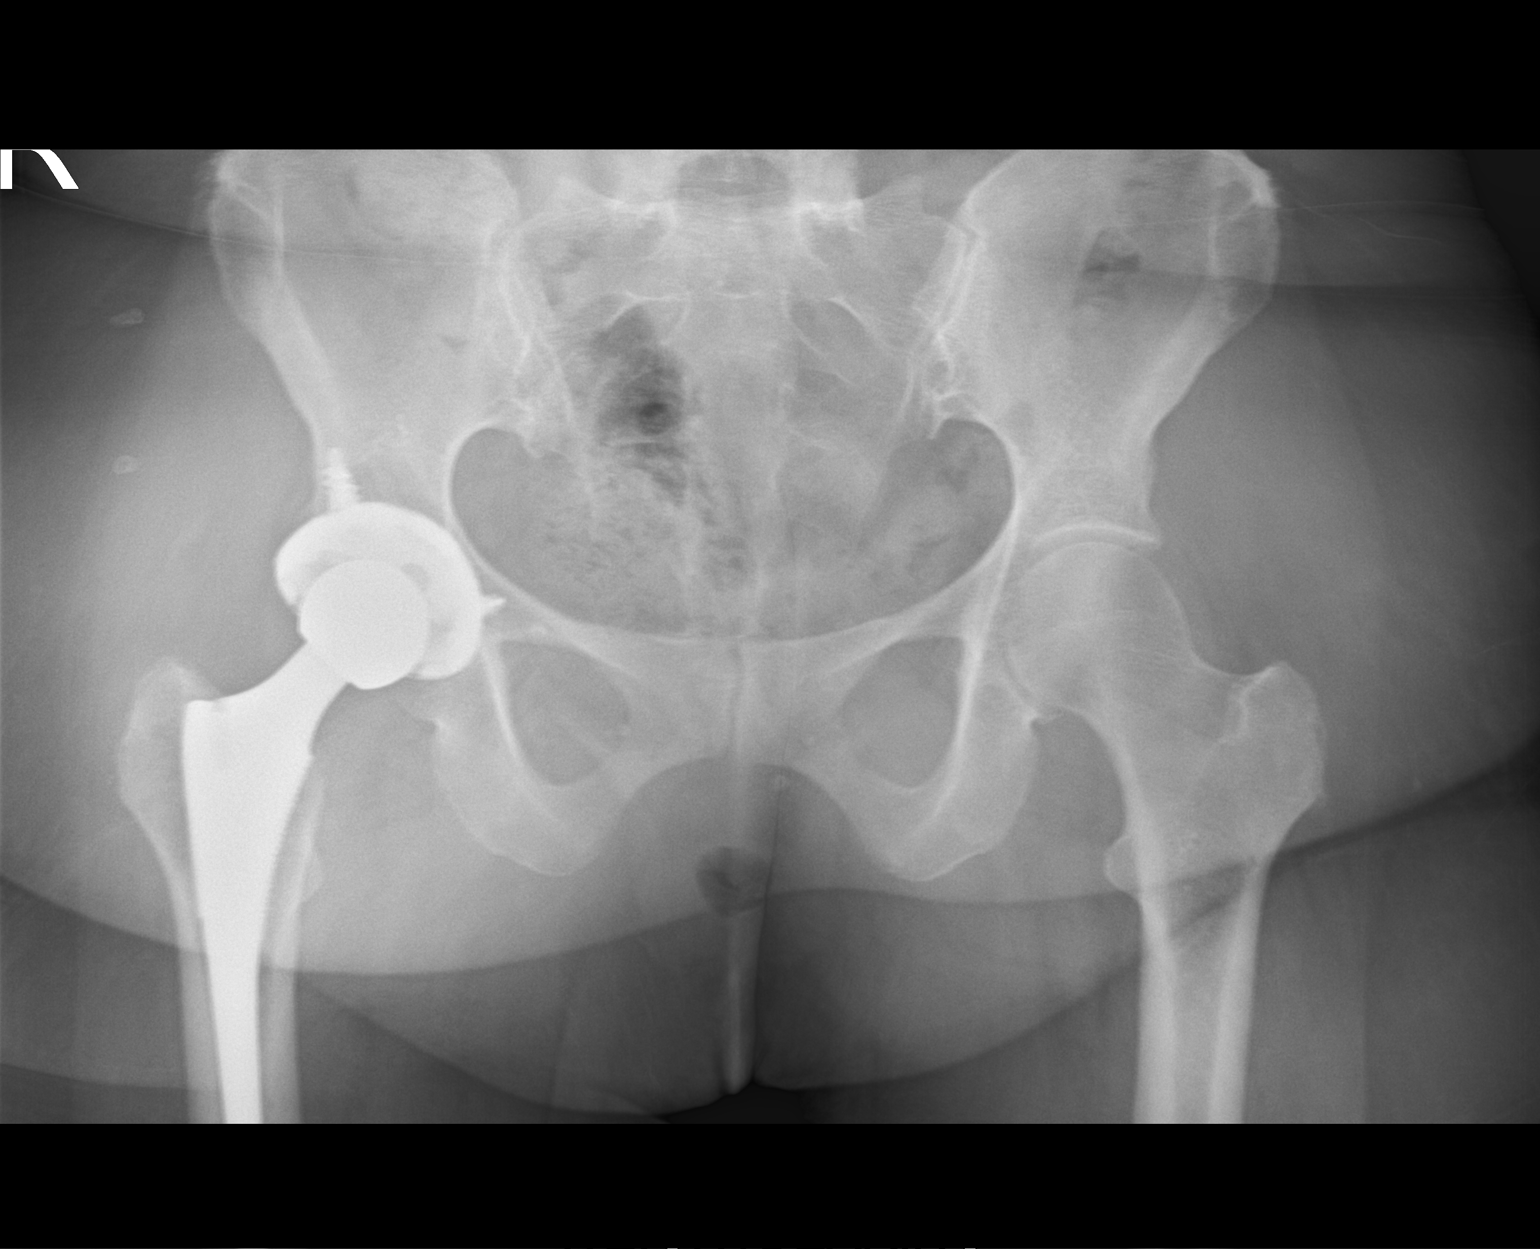

[hip ap]
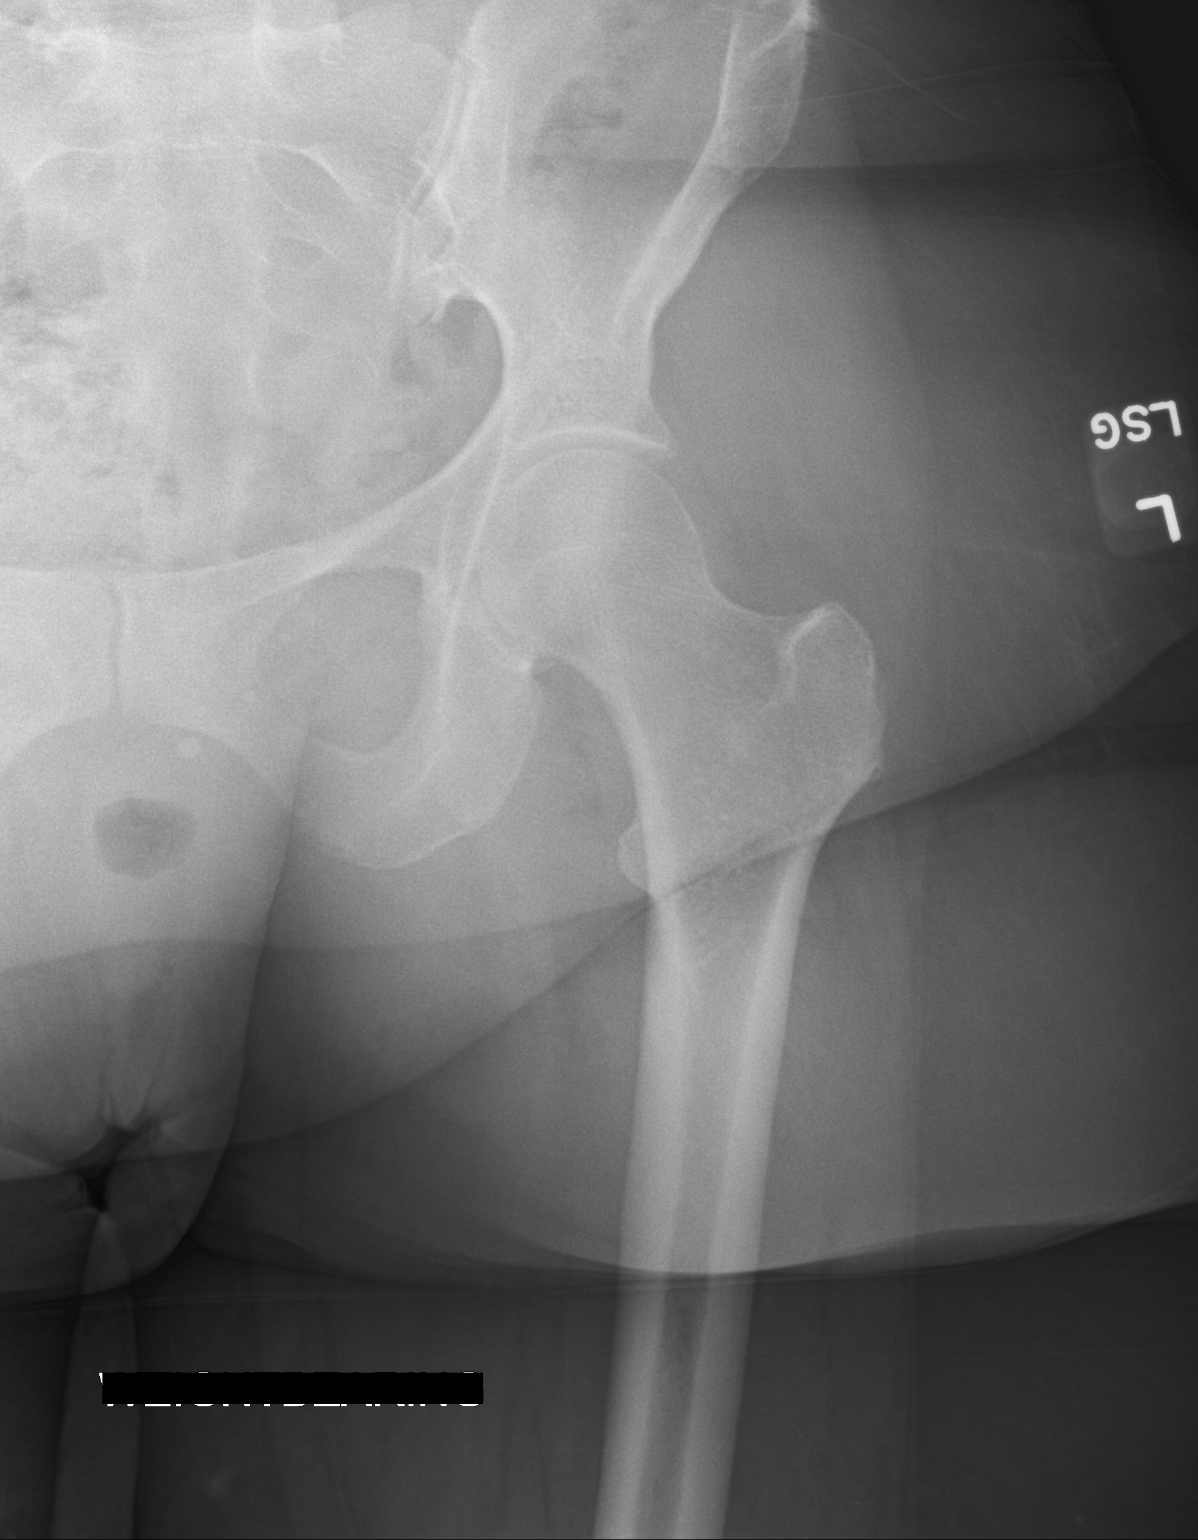

[hip lat]
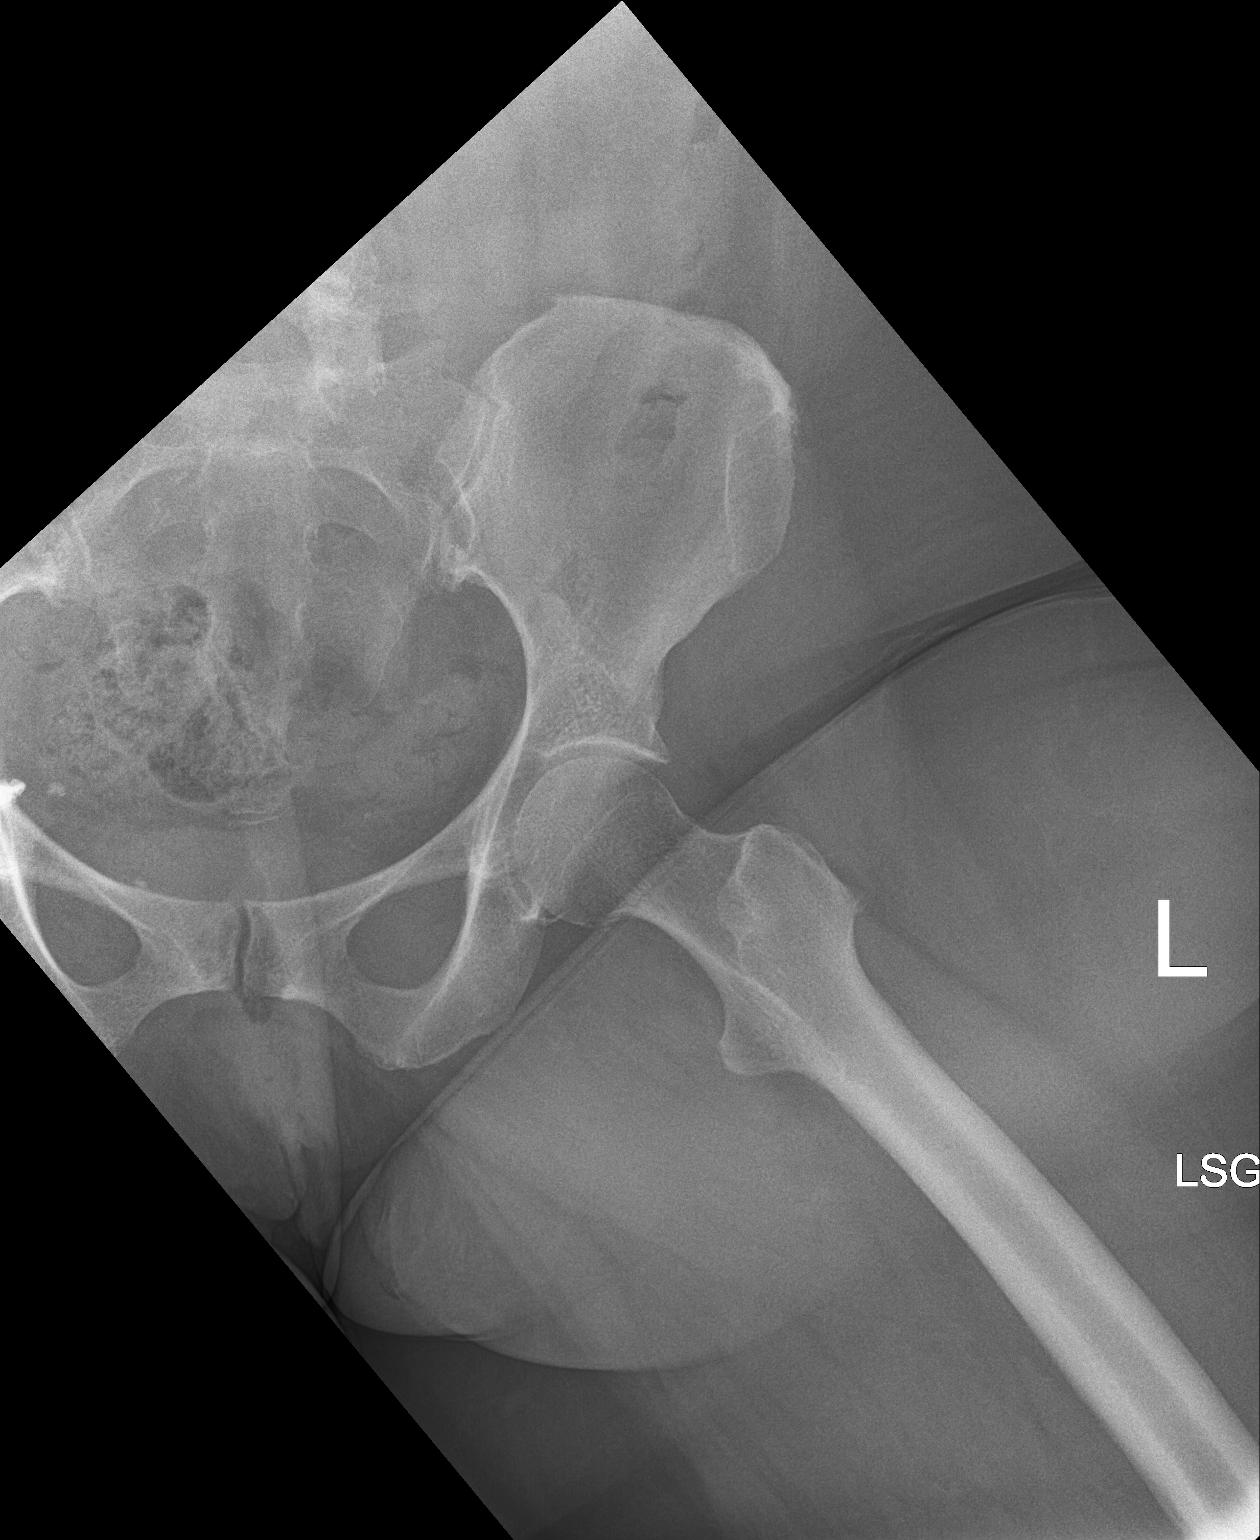

[3 of 3 positions shown; findings below may reference images not displayed]

FINDINGS: Pelvic ring is intact. Right hip replacement is noted. No acute
fracture or dislocation is seen. Mild degenerative changes of left
hip joint are noted.
IMPRESSION: Mild degenerative change without acute abnormality.

## 2019-07-17 DIAGNOSIS — R5382 Chronic fatigue, unspecified: Secondary | ICD-10-CM | POA: Insufficient documentation

## 2019-07-22 ENCOUNTER — Other Ambulatory Visit: Payer: Self-pay | Admitting: Internal Medicine

## 2019-07-29 ENCOUNTER — Telehealth: Payer: Self-pay

## 2019-07-29 ENCOUNTER — Other Ambulatory Visit: Payer: Self-pay

## 2019-07-29 ENCOUNTER — Other Ambulatory Visit: Payer: Self-pay | Admitting: Internal Medicine

## 2019-07-29 DIAGNOSIS — Z853 Personal history of malignant neoplasm of breast: Secondary | ICD-10-CM

## 2019-07-29 DIAGNOSIS — Z1231 Encounter for screening mammogram for malignant neoplasm of breast: Secondary | ICD-10-CM

## 2019-07-29 NOTE — Telephone Encounter (Signed)
Message received from Mirian Mo, [place] with info obtained during pre-screening process:  Patient has coronavirus and is very ill please cancel appts and rescedule when appropriate. Per patient.

## 2019-07-30 ENCOUNTER — Inpatient Hospital Stay: Payer: Medicare Other | Admitting: Oncology

## 2019-07-30 ENCOUNTER — Inpatient Hospital Stay: Payer: Medicare Other

## 2019-08-07 ENCOUNTER — Other Ambulatory Visit
Admission: RE | Admit: 2019-08-07 | Discharge: 2019-08-07 | Disposition: A | Discharge status: Home / Self Care | Payer: Medicare Other | Source: Ambulatory Visit | Attending: Internal Medicine | Admitting: Internal Medicine

## 2019-08-07 DIAGNOSIS — U071 COVID-19: Secondary | ICD-10-CM | POA: Diagnosis present

## 2019-08-07 DIAGNOSIS — Z452 Encounter for adjustment and management of vascular access device: Secondary | ICD-10-CM | POA: Insufficient documentation

## 2019-08-07 NOTE — Progress Notes (Signed)
IV RN attempted to access patient's port for lab collection.  Patient port accessed  With 20 gauge HPN.  Able to flush port but unable to draw blood.  Re attempts made x2, still able to to flush but unable to withdraw blood.  IV RN instructed patient to notify MD of possible occlusion.  Patient verbalized understanding.

## 2019-08-08 ENCOUNTER — Encounter: Payer: Self-pay | Admitting: Emergency Medicine

## 2019-08-08 ENCOUNTER — Other Ambulatory Visit: Payer: Self-pay

## 2019-08-08 ENCOUNTER — Emergency Department
Admission: EM | Admit: 2019-08-08 | Discharge: 2019-08-09 | Disposition: A | Discharge status: Home / Self Care | Payer: Medicare Other | Attending: Emergency Medicine | Admitting: Emergency Medicine

## 2019-08-08 DIAGNOSIS — Z79899 Other long term (current) drug therapy: Secondary | ICD-10-CM | POA: Diagnosis not present

## 2019-08-08 DIAGNOSIS — Z853 Personal history of malignant neoplasm of breast: Secondary | ICD-10-CM | POA: Insufficient documentation

## 2019-08-08 DIAGNOSIS — J449 Chronic obstructive pulmonary disease, unspecified: Secondary | ICD-10-CM | POA: Diagnosis not present

## 2019-08-08 DIAGNOSIS — U071 COVID-19: Secondary | ICD-10-CM | POA: Insufficient documentation

## 2019-08-08 DIAGNOSIS — Z87891 Personal history of nicotine dependence: Secondary | ICD-10-CM | POA: Diagnosis not present

## 2019-08-08 DIAGNOSIS — R071 Chest pain on breathing: Secondary | ICD-10-CM | POA: Insufficient documentation

## 2019-08-08 DIAGNOSIS — R079 Chest pain, unspecified: Secondary | ICD-10-CM | POA: Diagnosis not present

## 2019-08-08 DIAGNOSIS — Z96641 Presence of right artificial hip joint: Secondary | ICD-10-CM | POA: Diagnosis not present

## 2019-08-08 DIAGNOSIS — R0602 Shortness of breath: Secondary | ICD-10-CM | POA: Diagnosis present

## 2019-08-08 LAB — CBC
HCT: 36.3 % (ref 36.0–46.0)
Hemoglobin: 11.6 g/dL — ABNORMAL LOW (ref 12.0–15.0)
MCH: 28.8 pg (ref 26.0–34.0)
MCHC: 32 g/dL (ref 30.0–36.0)
MCV: 90.1 fL (ref 80.0–100.0)
Platelets: 132 10*3/uL — ABNORMAL LOW (ref 150–400)
RBC: 4.03 MIL/uL (ref 3.87–5.11)
RDW: 14.1 % (ref 11.5–15.5)
WBC: 3.2 10*3/uL — ABNORMAL LOW (ref 4.0–10.5)
nRBC: 0 % (ref 0.0–0.2)

## 2019-08-08 LAB — BASIC METABOLIC PANEL
Anion gap: 12 (ref 5–15)
BUN: 15 mg/dL (ref 6–20)
CO2: 23 mmol/L (ref 22–32)
Calcium: 8.7 mg/dL — ABNORMAL LOW (ref 8.9–10.3)
Chloride: 105 mmol/L (ref 98–111)
Creatinine, Ser: 0.65 mg/dL (ref 0.44–1.00)
GFR calc Af Amer: >60 mL/min (ref >60)
GFR calc non Af Amer: >60 mL/min (ref >60)
Glucose, Bld: 107 mg/dL — ABNORMAL HIGH (ref 70–99)
Potassium: 3.3 mmol/L — ABNORMAL LOW (ref 3.5–5.1)
Sodium: 140 mmol/L (ref 135–145)

## 2019-08-08 LAB — TROPONIN I (HIGH SENSITIVITY): Troponin I (High Sensitivity): 6 ng/L (ref <18)

## 2019-08-08 MED ORDER — CHLORHEXIDINE GLUCONATE CLOTH 2 % EX PADS
6.0000 | MEDICATED_PAD | Freq: Every day | CUTANEOUS | Status: DC
  Filled 2019-08-08 (×2): qty 6

## 2019-08-08 MED ORDER — SODIUM CHLORIDE 0.9 % IV BOLUS
1000.0000 mL | Freq: Once | INTRAVENOUS | Status: AC
  Administered 2019-08-08: 23:00:00 1000 mL via INTRAVENOUS

## 2019-08-08 MED ORDER — SODIUM CHLORIDE 0.9% FLUSH: 10.0000 mL | INTRAVENOUS | Status: DC | PRN

## 2019-08-08 MED ORDER — SODIUM CHLORIDE 0.9% FLUSH: 10.0000 mL | Freq: Two times a day (BID) | INTRAVENOUS | Status: DC

## 2019-08-08 MED ORDER — ALTEPLASE 2 MG IJ SOLR
2.0000 mg | Freq: Once | INTRAMUSCULAR | Status: AC
  Administered 2019-08-08: 2 mg
  Filled 2019-08-08: qty 2

## 2019-08-08 NOTE — ED Provider Notes (Signed)
-----------------------------------------   11:12 PM on 08/08/2019 -----------------------------------------  Assuming care from Dr. Joni Fears.  In short, Victoria [name] is a 59 y.o. female with a chief complaint of chest pain and SOB.  Refer to the original H&P for additional details.  The current plan of care is to follow up on labs and CTA chest.  Anticipate d/c home if no evidence of emergent medical condition.   ----------------------------------------- 2:13 AM on 08/09/2019 -----------------------------------------  Patient has been resting quietly and calmly for almost 10-1/2 hours in the emergency department due to issues with her Port-A-Cath.  Fortunately her lab work was generally reassuring with no evidence of any acute abnormality including a low troponin of 6.  No indication for repeat given the duration of the symptoms.  She is currently asymptomatic.  Her CTA chest shows no evidence of pulmonary embolism and does confirm some groundglass opacities consistent with COVID-19 infection.  I updated the patient.  She is currently not tachycardic, not tachypneic, and has an oxygen saturation of 100%.  I gave my usual and customary return precautions and follow-up recommendations.   Hinda Kehr, MD 08/09/19 [phone]

## 2019-08-08 NOTE — ED Triage Notes (Addendum)
Pt here for CP/SHOB for last 3 days.  Was dx with covid 12/17 but reports was cleared from this.  Has CXR at PCP yesterday but it is not loaded into care everywhere at this time.  Unlabored. VSS. No fever.  Sent for possible PE per pt.    Pt does not want to do repeat chest xray at this timeeven though not visible and just wants the CT scan for PE. Also would like to wait till in room and have port accessed for blood work, does not want butterfly stick in triage for labs

## 2019-08-08 NOTE — ED Provider Notes (Signed)
[facility] Emergency Department Provider Note  ____________________________________________  Time seen: Approximately 5:52 PM  I have reviewed the triage vital signs and the nursing notes.   HISTORY  Chief Complaint Shortness of Breath    HPI Victoria [name] is a 59 y.o. 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  She was diagnosed with Covid on July 24, 2019, reports that she only had minor symptoms which resolved.  She considers this illness resolved.  However, over the last 3 days she has developed central chest pain radiating to her back that is sharp, worse with coughing and deep breathing and movement.  Associated with shortness of breath.  No fevers or chills.  Not exertional.  Frequent but intermittent.  Moderate intensity.  Saw her doctor yesterday who recommended coming to the emergency department for CT scan.      Past Medical History:  Diagnosis Date  . Asthma   . Cancer ([place])    Breast Left, DCIS 2018  . Collagen vascular disease (HCC)    Lupus  . COPD (chronic obstructive pulmonary disease) ([place])   . Diverticulitis   . Fibromyalgia   . GERD (gastroesophageal reflux disease)   . Hearing impaired person, left   . IBS (irritable bowel syndrome)   . Lupus ([place])   . Mitral valve disorder   . MS (multiple sclerosis) ([place])   . Neuromuscular disorder ([place])   . Osteoarthritis   . Osteoporosis 05/12/2019  . Personal history of chemotherapy   . Personal history of radiation therapy 2017   left   . Rheumatoid arthritis ([place])   . Seizures [facility])      Patient Active Problem List   Diagnosis Date Noted  . Osteoporosis 05/12/2019  . Thrombocytopenia ([place]) 01/23/2019  . Leukopenia 01/23/2019  . Lymphedema of both lower extremities 01/16/2019  . Polyarthralgia  10/14/2018  . Headache disorder 10/14/2018  . Chronic daily headache 10/11/2018  . COPD (chronic obstructive pulmonary disease) ([place]) 10/11/2018  . Thoracic aortic atherosclerosis ([place]) 10/11/2018  . Multiple episodes of deep venous thrombosis ([place]) 09/17/2018  . IBS (irritable bowel syndrome) 09/17/2018  . Healthcare maintenance 09/17/2018  . Primary osteoarthritis of right hip 09/03/2018  . Presence of right hip implant 09/03/2018  . Morbid obesity with BMI of 45.0-49.9, adult ([place]) 08/23/2018  . Shortness of breath 08/22/2018  . History of ductal carcinoma in situ (DCIS) of breast 07/20/2018  . Aromatase inhibitor use 07/20/2018  . Abnormal findings on auscultation 07/16/2018  . Encounter for long-term (current) use of high-risk medication 07/16/2018  . Fibromyalgia 07/16/2018  . Other forms of systemic lupus erythematosus ([place]) 07/16/2018  . Other osteoporosis without current pathological fracture 07/16/2018  . 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 ABDOMINAL HYSTERECTOMY    . APPENDECTOMY    . BREAST EXCISIONAL BIOPSY Left 2017   lumpectomy with radation  . BREAST EXCISIONAL BIOPSY Left 2004   neg surgical bx   . BREAST SURGERY    . CHOLECYSTECTOMY    . COLONOSCOPY WITH PROPOFOL N/A 07/07/2019   Procedure: COLONOSCOPY WITH PROPOFOL;  Surgeon: Toledo, Benay Pike, MD;  Location: [facility] ENDOSCOPY;  Service: Gastroenterology;  Laterality: N/A;  . ESOPHAGOGASTRODUODENOSCOPY (EGD) WITH PROPOFOL N/A 07/07/2019   Procedure: ESOPHAGOGASTRODUODENOSCOPY (EGD) WITH PROPOFOL;  Surgeon: Toledo, Benay Pike, MD;  Location: [facility] ENDOSCOPY;  Service: Gastroenterology;  Laterality: N/A;  . PATENT DUCTUS ARTERIOUS REPAIR    . RECONSTRUCTION TENDON PULLEY HAND Left   . TOTAL HIP ARTHROPLASTY Right      Prior to Admission medications   Medication Sig Start Date End Date Taking? Authorizing Provider  albuterol (PROVENTIL HFA;VENTOLIN HFA) 108  (90 Base) MCG/ACT inhaler Inhale 2 puffs into the lungs every 6 (six) hours as needed for wheezing or shortness of breath. 06/17/18   Elby Beck, FNP  atorvastatin (LIPITOR) 40 MG tablet Take 40 mg by mouth daily.    [provider]  benzonatate (TESSALON) 100 MG capsule Take 1 capsule (100 mg total) by mouth 3 (three) times daily as needed. Patient not taking: Reported on 07/14/2019 06/25/18   Robinson, [place] N, PA-C  budesonide-formoterol [facility]) 160-4.5 MCG/ACT inhaler Inhale 2 puffs into the lungs 2 (two) times daily. 05/06/18   Paulette Blanch, MD  cyclobenzaprine (FLEXERIL) 10 MG tablet TAKE 1 TABLET BY MOUTH TWICE A DAY AS NEEDED FOR MUSCLE SPASMS 09/25/18   Elby Beck, FNP  dabigatran (PRADAXA) 150 MG CAPS capsule Take 1 capsule (150 mg total) by mouth 2 (two) times daily. 06/17/18   Elby Beck, FNP  gabapentin (NEURONTIN) 300 MG capsule Take 600 mg by mouth 3 (three) times daily.    [provider]  HYDROcodone-acetaminophen (NORCO) 7.5-325 MG tablet Take 1 tablet by mouth 2 (two) times daily as needed for moderate pain. 07/25/19 08/24/19  Gillis Santa, MD  HYDROcodone-acetaminophen (NORCO) 7.5-325 MG tablet Take 1 tablet by mouth 2 (two) times daily as needed for moderate pain. 08/24/19 09/23/19  Gillis Santa, MD  HYDROcodone-acetaminophen (NORCO) 7.5-325 MG tablet Take 1 tablet by mouth 2 (two) times daily as needed for moderate pain. 09/23/19 10/23/19  Gillis Santa, MD  hydroxychloroquine (PLAQUENIL) 200 MG tablet Take 200 mg by mouth daily.    [provider]  ibuprofen (ADVIL) 600 MG tablet  01/23/19   [provider]  leflunomide (ARAVA) 20 MG tablet Take 20 mg by mouth daily.    [provider]  letrozole (FEMARA) 2.5 MG tablet Take 1 tablet (2.5 mg total) by mouth daily. 05/06/19   Earlie Server, MD  omeprazole (PRILOSEC) 40 MG capsule Take 1 capsule (40 mg total) by mouth 2 (two) times daily. 07/12/18   Elby Beck, FNP   ondansetron (ZOFRAN-ODT) 8 MG disintegrating tablet Take by mouth. 04/03/19   [provider]  potassium chloride (K-DUR) 10 MEQ tablet Take 10 mEq by mouth daily.    [provider]  Tiotropium Bromide Monohydrate (SPIRIVA RESPIMAT) 2.5 MCG/ACT AERS Inhale 2 puffs into the lungs every morning. 06/17/18   Elby Beck, FNP  Topiramate (TOPAMAX PO) Take 50 mg by mouth 2 (two) times daily.     [provider]  torsemide (DEMADEX) 10 MG tablet  12/12/18   [provider]  traZODone (DESYREL) 50 MG tablet Take 3 tablets (150 mg total) by mouth at bedtime. 06/17/18   Elby Beck, FNP     Allergies Sodium hypochlorite, Codeine, Flagyl [metronidazole], and Sulfa antibiotics   Family History  Problem Relation Age of Onset  . COPD Mother   . Stroke Father   . Arthritis Brother   . Diabetes Brother   . Stroke Brother     Social History Social History   Tobacco Use  . Smoking status: Former Smoker    Packs/day: 2.00  Years: 15.00    Pack years: 30.00    Types: Cigarettes    Quit date: 06/27/1999    Years since quitting: 20.1  . Smokeless tobacco: Never Used  Substance Use Topics  . Alcohol use: Yes    Comment: Rarely - approx 6 beers once a month  . Drug use: Never    Review of Systems  Constitutional:   No fever or chills.  ENT:   No sore throat. No rhinorrhea. Cardiovascular: Positive chest pain as above without syncope. Respiratory:   Positive shortness of breath and nonproductive cough. Gastrointestinal:   Negative for abdominal pain, vomiting and diarrhea.  Musculoskeletal:   Negative for focal pain or swelling All other systems reviewed and are negative except as documented above in ROS and HPI.  ____________________________________________   PHYSICAL EXAM:  VITAL SIGNS: ED Triage Vitals  Enc Vitals Group     BP 08/08/19 1554 (!) 104/56     Pulse Rate 08/08/19 1554 98     Resp 08/08/19 1554 20     Temp 08/08/19  1554 98 F (36.7 C)     Temp Source 08/08/19 1554 Oral     SpO2 08/08/19 1554 99 %     Weight 08/08/19 1551 189 lb (85.7 kg)     Height 08/08/19 1551 4\' 7"  (1.397 m)     Head Circumference --      Peak Flow --      Pain Score 08/08/19 1551 10     Pain Loc --      Pain Edu? --      Excl. in [place]? --     Vital signs reviewed, nursing assessments reviewed.   Constitutional:   Alert and oriented. Non-toxic appearance. Eyes:   Conjunctivae are normal. EOMI. PERRL. ENT      Head:   Normocephalic and atraumatic.      Nose:   Wearing a mask.      Mouth/Throat:   Wearing a mask.      Neck:   No meningismus. Full ROM. Hematological/Lymphatic/Immunilogical:   No cervical lymphadenopathy. Cardiovascular:   RRR. Symmetric bilateral radial and DP pulses.  No murmurs. Cap refill less than 2 seconds. Respiratory:   Normal respiratory effort without tachypnea/retractions. Breath sounds are clear and equal bilaterally. No wheezes/rales/rhonchi. Gastrointestinal:   Soft and nontender. Non distended. There is no CVA tenderness.  No rebound, rigidity, or guarding.  Musculoskeletal:   Normal range of motion in all extremities. No joint effusions.  No lower extremity tenderness.  No edema.  Chest wall tenderness reproducing her pain at the left lower anterior chest as demonstrated by the patient.   Neurologic:   Normal speech and language.  Motor grossly intact. No acute focal neurologic deficits are appreciated.  Skin:    Skin is warm, dry and intact. No rash noted.  No petechiae, purpura, or bullae.  ____________________________________________    LABS (pertinent positives/negatives) (all labs ordered are listed, but only abnormal results are displayed) Labs Reviewed  BASIC METABOLIC PANEL  CBC  TROPONIN I (HIGH SENSITIVITY)   ____________________________________________   EKG  Interpreted by me Normal sinus rhythm rate of 84, normal axis intervals QRS ST segments and T waves.  No acute  ischemic changes.  No evidence of right heart strain on EKG  ____________________________________________    RADIOLOGY  No results found.  ____________________________________________   PROCEDURES Procedures  ____________________________________________  DIFFERENTIAL DIAGNOSIS   Pulmonary embolism, chest wall strain, post Covid syndrome, pleurisy, pneumonia, pneumothorax, carditis  CLINICAL IMPRESSION / ASSESSMENT AND PLAN / ED COURSE  Medications ordered in the ED: Medications  sodium chloride 0.9 % bolus 1,000 mL (has no administration in time range)  sodium chloride flush (NS) 0.9 % injection 10-40 mL (has no administration in time range)  sodium chloride flush (NS) 0.9 % injection 10-40 mL (has no administration in time range)  Chlorhexidine Gluconate Cloth 2 % PADS 6 each (has no administration in time range)  alteplase (CATHFLO ACTIVASE) injection 2 mg (2 mg Intracatheter Given 08/08/19 2013)    Pertinent labs & imaging results that were available during my care of the patient were reviewed by me and considered in my medical decision making (see chart for details).  Victoria [name] was evaluated in Emergency Department on 08/08/2019 for the symptoms described in the history of present illness. She was evaluated in the context of the global COVID-19 pandemic, which necessitated consideration that the patient might be at risk for infection with the SARS-CoV-2 virus that causes COVID-19. Institutional protocols and algorithms that pertain to the evaluation of patients at risk for COVID-19 are in a state of rapid change based on information released by regulatory bodies including the CDC and federal and state organizations. These policies and algorithms were followed during the patient's care in the ED.     Clinical Course as of Aug 08 2211  Fri Aug 08, 2019  1709 Patient with a history of DVTs, on Pradaxa, with an IVC filter in place from 4 years ago presents with pleuritic chest  pain, shortness of breath for the past 3 days in the setting of recent COVID-19 illness.  Differential includes PE, chest wall pain, pleurisy, pericarditis.  Doubt dissection, ACS, or sepsis.  Checking a single troponin to evaluate for carditis, does not need to be trended due to clinical presentation not consistent with ACS.  I will give IV fluids for hydration, check labs, obtain CT scan of the chest..   [PS]  1913 Delay in obtaining labs and therefore CT scan because patient's Port-A-Cath will not draw blood and patient refuses to allow straight stick for labs.  IV team at bedside planning to use alteplase to attempt declotting her catheter.   [PS]  1958 Recently received alteplase from the pharmacy for declotting of the Port-A-Cath.  IV team had to report off should there night shift.  Care is still delayed by the patient's unwillingness to have a straight stick for labs.   [PS]    Clinical Course User Index [PS] Carrie Mew, MD     ----------------------------------------- 10:12 PM on 08/08/2019 -----------------------------------------  Still waiting for alteplase dwell to finish so that hopefully the patient's Port-A-Cath can be used for lab draw and sure it safe to use IV contrast for the CT scan.  Not able to proceed any sooner because the patient still will not allow straight stick from a peripheral site for lab draw.  Care will be signed out to oncoming physician at the end of my shift to follow-up on labs and CT scan result.  ____________________________________________   FINAL CLINICAL IMPRESSION(S) / ED DIAGNOSES    Final diagnoses:  COVID-19 virus infection  Nonspecific chest pain     ED Discharge Orders    None      Portions of this note were generated with dragon dictation software. Dictation errors may occur despite best attempts at proofreading.   Carrie Mew, MD 08/08/19 2214

## 2019-08-08 NOTE — ED Notes (Signed)
Resumed care from jerrie rn.  Pt awake.  Pt waiting on labs to be drawn from port from iv team.  Pt aware.

## 2019-08-08 NOTE — ED Notes (Signed)
First Nurse Note: Pt sent by her MD for evaluation for possible PE. Pt was supposed to have d-dimer drawn yesterday but was unable to. Pt having ShOB. Pt recently had COVID.

## 2019-08-09 ENCOUNTER — Emergency Department: Payer: Medicare Other

## 2019-08-09 MED ORDER — IOHEXOL 350 MG/ML SOLN
75.0000 mL | Freq: Once | INTRAVENOUS | Status: AC | PRN
  Administered 2019-08-09: 75 mL via INTRAVENOUS

## 2019-08-09 NOTE — Discharge Instructions (Signed)
As we discussed, your work-up was reassuring tonight with no evidence of pulmonary embolism.  We believe your persistent pain is likely due to your COVID-19 infection.  Please continue drinking plenty of fluids and taking all of your regular medications.  Follow-up with your regular doctors.  Return to the emergency department with new or worsening symptoms that concern you.

## 2019-08-09 NOTE — ED Notes (Signed)
Pt in ct scan  

## 2019-08-21 ENCOUNTER — Other Ambulatory Visit: Payer: Self-pay

## 2019-08-21 ENCOUNTER — Encounter: Payer: Self-pay | Admitting: Oncology

## 2019-08-21 DIAGNOSIS — D500 Iron deficiency anemia secondary to blood loss (chronic): Secondary | ICD-10-CM

## 2019-08-21 NOTE — Progress Notes (Signed)
Patient prescreened for appt on 1/15. Pt reports that she was on antibiotics and prednisone because she had "covid pneumonia." completed medication course today. She tested positive for COVID the week of christmas and since then she has had decreased appetite. Pt was suppose to have mammogram in December (ordered by Dr. Ouida Sills), but was unable to due to COVID. I advised pt to call the breast center and reschedule appt. No new breast issues reported.

## 2019-08-22 ENCOUNTER — Other Ambulatory Visit: Payer: Self-pay | Admitting: Family Medicine

## 2019-08-22 ENCOUNTER — Inpatient Hospital Stay: Payer: Medicare Other | Attending: Oncology

## 2019-08-22 ENCOUNTER — Other Ambulatory Visit: Payer: Self-pay

## 2019-08-22 ENCOUNTER — Inpatient Hospital Stay (HOSPITAL_BASED_OUTPATIENT_CLINIC_OR_DEPARTMENT_OTHER): Payer: Medicare Other | Admitting: Oncology

## 2019-08-22 VITALS — BP 116/75 | HR 89 | Temp 96.3°F | Wt 184.1 lb

## 2019-08-22 DIAGNOSIS — D696 Thrombocytopenia, unspecified: Secondary | ICD-10-CM | POA: Insufficient documentation

## 2019-08-22 DIAGNOSIS — Z86000 Personal history of in-situ neoplasm of breast: Secondary | ICD-10-CM

## 2019-08-22 DIAGNOSIS — D649 Anemia, unspecified: Secondary | ICD-10-CM | POA: Insufficient documentation

## 2019-08-22 DIAGNOSIS — Z95828 Presence of other vascular implants and grafts: Secondary | ICD-10-CM

## 2019-08-22 DIAGNOSIS — D0512 Intraductal carcinoma in situ of left breast: Secondary | ICD-10-CM | POA: Diagnosis present

## 2019-08-22 DIAGNOSIS — Z79899 Other long term (current) drug therapy: Secondary | ICD-10-CM | POA: Insufficient documentation

## 2019-08-22 DIAGNOSIS — Z86718 Personal history of other venous thrombosis and embolism: Secondary | ICD-10-CM | POA: Diagnosis not present

## 2019-08-22 DIAGNOSIS — D500 Iron deficiency anemia secondary to blood loss (chronic): Secondary | ICD-10-CM

## 2019-08-22 DIAGNOSIS — M810 Age-related osteoporosis without current pathological fracture: Secondary | ICD-10-CM | POA: Diagnosis not present

## 2019-08-22 DIAGNOSIS — E559 Vitamin D deficiency, unspecified: Secondary | ICD-10-CM | POA: Insufficient documentation

## 2019-08-22 DIAGNOSIS — Z79811 Long term (current) use of aromatase inhibitors: Secondary | ICD-10-CM

## 2019-08-22 DIAGNOSIS — Z923 Personal history of irradiation: Secondary | ICD-10-CM | POA: Diagnosis not present

## 2019-08-22 LAB — COMPREHENSIVE METABOLIC PANEL WITH GFR
ALT: 22 U/L (ref 0–44)
AST: 16 U/L (ref 15–41)
Albumin: 3.3 g/dL — ABNORMAL LOW (ref 3.5–5.0)
Alkaline Phosphatase: 56 U/L (ref 38–126)
Anion gap: 8 (ref 5–15)
BUN: 22 mg/dL — ABNORMAL HIGH (ref 6–20)
CO2: 21 mmol/L — ABNORMAL LOW (ref 22–32)
Calcium: 8.7 mg/dL — ABNORMAL LOW (ref 8.9–10.3)
Chloride: 109 mmol/L (ref 98–111)
Creatinine, Ser: 0.75 mg/dL (ref 0.44–1.00)
GFR calc Af Amer: >60 mL/min
GFR calc non Af Amer: >60 mL/min
Glucose, Bld: 88 mg/dL (ref 70–99)
Potassium: 3.9 mmol/L (ref 3.5–5.1)
Sodium: 138 mmol/L (ref 135–145)
Total Bilirubin: 0.5 mg/dL (ref 0.3–1.2)
Total Protein: 6 g/dL — ABNORMAL LOW (ref 6.5–8.1)

## 2019-08-22 LAB — CBC WITH DIFFERENTIAL/PLATELET
Abs Immature Granulocytes: 0.04 K/uL (ref 0.00–0.07)
Basophils Absolute: 0 K/uL (ref 0.0–0.1)
Basophils Relative: 1 %
Eosinophils Absolute: 0.1 K/uL (ref 0.0–0.5)
Eosinophils Relative: 2 %
HCT: 35.3 % — ABNORMAL LOW (ref 36.0–46.0)
Hemoglobin: 11.2 g/dL — ABNORMAL LOW (ref 12.0–15.0)
Immature Granulocytes: 1 %
Lymphocytes Relative: 27 %
Lymphs Abs: 1.3 K/uL (ref 0.7–4.0)
MCH: 29.5 pg (ref 26.0–34.0)
MCHC: 31.7 g/dL (ref 30.0–36.0)
MCV: 92.9 fL (ref 80.0–100.0)
Monocytes Absolute: 0.3 K/uL (ref 0.1–1.0)
Monocytes Relative: 6 %
Neutro Abs: 3.1 K/uL (ref 1.7–7.7)
Neutrophils Relative %: 63 %
Platelets: 119 K/uL — ABNORMAL LOW (ref 150–400)
RBC: 3.8 MIL/uL — ABNORMAL LOW (ref 3.87–5.11)
RDW: 15.5 % (ref 11.5–15.5)
WBC: 4.9 K/uL (ref 4.0–10.5)
nRBC: 0 % (ref 0.0–0.2)

## 2019-08-22 MED ORDER — HEPARIN SOD (PORK) LOCK FLUSH 100 UNIT/ML IV SOLN
500.0000 [IU] | Freq: Once | INTRAVENOUS | Status: AC
  Administered 2019-08-22: 500 [IU] via INTRAVENOUS
  Filled 2019-08-22: qty 5

## 2019-08-22 MED ORDER — SODIUM CHLORIDE 0.9% FLUSH
10.0000 mL | Freq: Once | INTRAVENOUS | Status: AC
  Administered 2019-08-22: 10 mL via INTRAVENOUS
  Filled 2019-08-22: qty 10

## 2019-08-22 NOTE — Telephone Encounter (Signed)
Refill refused. Pt PCP is now [facility]. Has not been seen in this office since 2019

## 2019-08-22 NOTE — Progress Notes (Signed)
Hematology/Oncology follow up note [facility] [phone] [phone]   Patient Care Team: Kirk Ruths, MD as PCP - General (Internal Medicine)  REFERRING PROVIDER: Kirk Ruths, MD CHIEF COMPLAINTS/REASON FOR VISIT:  Follow up for DCIS HISTORY OF PRESENTING ILLNESS:  Victoria [name] is a  59 y.o.  female with PMH listed below who was referred to me for evaluation of history of breast cancer Patient moved from [place]. Also report having left lower extremity blood clot 3 years ago, started on Eliquis for a month and developed pulmonary embolism.  Switch to Pradaxa.  She also had IVC filter.  After she moved to [place], she was temporarily off Pradaxa.  Currently back on anticoagulation. She presented to emergency room on 06/25/2018 for evaluation of right arm pain, complaining about developing knots right upper extremity.  # DCIS:  I obtained previous oncology history from [place]. Medical records were reviewed by me.  Patient was diagnosed with left breast DCIS in 2017, s/p lumpectomy and radiation.  Initially was started on Arimidex however cannot tolerate and was switched to Letrozole since Dec 2017.   # rheumatoid arthritis/lupus follows up with Dr. Cydney Ok.  No rashes follow-up with Dr. Carlyn Reichert   # history of unprovoked VTE, with IVC filter,  recurrent, on Pradaxa # [place] left thigh showed most likely lipoma.  She has also established care with Rheumatology. started on Leflunomide 20mg  daily since March 2020.  Also has been on Plaquenil 200mg    # Chronic anemia and thrombocytopenia. Previous work-up includes normal LDH, folate and vitamin B12 level, negative hepatitis panel, HIV negative.  Negative SPEP-[facility].  Normal immature platelet fraction indicating underproduction. INTERVAL HISTORY Victoria [name] is a 59 y.o. female who has above history reviewed by me today presents for  follow up visit for management of history of DCIS.  Patient denies any breast concerns today. She had COVID-19 pneumonia in December.  She reports her symptoms are gradually getting better.  Continues to have fatigue, some shortness of breath with exertion. For DCIS, she takes letrozole daily.  Manageable hot flashes. Osteoporosis, she takes calcium and vitamin D. Has received Zometa in October Her mammogram was due in December due to COVID-19 pneumonia, postponed.  Review of Systems  Constitutional: Positive for fatigue. Negative for appetite change, chills and fever.  HENT:   Negative for hearing loss and voice change.   Eyes: Negative for eye problems.  Respiratory: Positive for shortness of breath. Negative for chest tightness and cough.   Cardiovascular: Negative for chest pain.  Gastrointestinal: Negative for abdominal distention, abdominal pain and blood in stool.  Endocrine: Positive for hot flashes.  Genitourinary: Negative for difficulty urinating and frequency.   Musculoskeletal: Positive for arthralgias, back pain and myalgias.  Skin: Negative for itching and rash.  Neurological: Negative for extremity weakness.  Hematological: Negative for adenopathy.  Psychiatric/Behavioral: Negative for confusion.    MEDICAL HISTORY:  Past Medical History:  Diagnosis Date  . Asthma   . Cancer ([place])    Breast Left, DCIS 2018  . Collagen vascular disease ([facility])    Lupus  . COPD (chronic obstructive pulmonary disease) ([place])   . Diverticulitis   . Fibromyalgia   . GERD (gastroesophageal reflux disease)   . Hearing impaired person, left   . IBS (irritable bowel syndrome)   . Lupus ([place])   . Mitral valve disorder   . MS (multiple sclerosis) ([place])   . Neuromuscular disorder ([place])   .  Osteoarthritis   . Osteoporosis 05/12/2019  . Personal history of chemotherapy   . Personal history of radiation therapy 2017   left   . Rheumatoid arthritis ([place])   . Seizures ([place])     SURGICAL  HISTORY: Past Surgical History:  Procedure Laterality Date  . ABDOMINAL HYSTERECTOMY    . APPENDECTOMY    . BREAST EXCISIONAL BIOPSY Left 2017   lumpectomy with radation  . BREAST EXCISIONAL BIOPSY Left 2004   neg surgical bx   . BREAST SURGERY    . CHOLECYSTECTOMY    . COLONOSCOPY WITH PROPOFOL N/A 07/07/2019   Procedure: COLONOSCOPY WITH PROPOFOL;  Surgeon: Toledo, Benay Pike, MD;  Location: [facility] ENDOSCOPY;  Service: Gastroenterology;  Laterality: N/A;  . ESOPHAGOGASTRODUODENOSCOPY (EGD) WITH PROPOFOL N/A 07/07/2019   Procedure: ESOPHAGOGASTRODUODENOSCOPY (EGD) WITH PROPOFOL;  Surgeon: Toledo, Benay Pike, MD;  Location: [facility] ENDOSCOPY;  Service: Gastroenterology;  Laterality: N/A;  . PATENT DUCTUS ARTERIOUS REPAIR    . RECONSTRUCTION TENDON PULLEY HAND Left   . TOTAL HIP ARTHROPLASTY Right     SOCIAL HISTORY: Social History   Socioeconomic History  . Marital status: Widowed    Spouse name: Not on file  . Number of children: 1  . Years of education: Not on file  . Highest education level: Some college, no degree  Occupational History  . Not on file  Tobacco Use  . Smoking status: Former Smoker    Packs/day: 2.00    Years: 15.00    Pack years: 30.00    Types: Cigarettes    Quit date: 06/27/1999    Years since quitting: 20.1  . Smokeless tobacco: Never Used  Substance and Sexual Activity  . Alcohol use: Yes    Comment: Rarely - approx 6 beers once a month  . Drug use: Never  . Sexual activity: Not Currently  Other Topics Concern  . Not on file  Social History Narrative  . Not on file   Social Determinants of Health   Financial Resource Strain: Low Risk   . Difficulty of Paying Living Expenses: Not hard at all  Food Insecurity: No Food Insecurity  . Worried About Charity fundraiser in the Last Year: Never true  . Ran Out of Food in the Last Year: Never true  Transportation Needs: No Transportation Needs  . Lack of Transportation (Medical): No  . Lack of  Transportation (Non-Medical): No  Physical Activity: Inactive  . Days of Exercise per Week: 0 days  . Minutes of Exercise per Session: 0 min  Stress: No Stress Concern Present  . Feeling of Stress : Not at all  Social Connections: Unknown  . Frequency of Communication with Friends and Family: Not on file  . Frequency of Social Gatherings with Friends and Family: Not on file  . Attends Religious Services: More than 4 times per year  . Active Member of Clubs or Organizations: No  . Attends Archivist Meetings: Never  . Marital Status: Widowed  Intimate Partner Violence: Not At Risk  . Fear of Current or Ex-Partner: No  . Emotionally Abused: No  . Physically Abused: No  . Sexually Abused: No    FAMILY HISTORY: Family History  Problem Relation Age of Onset  . COPD Mother   . Stroke Father   . Arthritis Brother   . Diabetes Brother   . Stroke Brother     ALLERGIES:  is allergic to sodium hypochlorite; tape; codeine; flagyl [metronidazole]; levofloxacin; and sulfa antibiotics.  MEDICATIONS:  Current Outpatient Medications  Medication Sig Dispense Refill  . albuterol (PROVENTIL HFA;VENTOLIN HFA) 108 (90 Base) MCG/ACT inhaler Inhale 2 puffs into the lungs every 6 (six) hours as needed for wheezing or shortness of breath. 1 Inhaler 2  . atorvastatin (LIPITOR) 40 MG tablet Take 40 mg by mouth daily.    . benzonatate (TESSALON) 100 MG capsule Take 1 capsule (100 mg total) by mouth 3 (three) times daily as needed. 30 capsule 0  . budesonide-formoterol (SYMBICORT) 160-4.5 MCG/ACT inhaler Inhale 2 puffs into the lungs 2 (two) times daily. 1 Inhaler 12  . cyclobenzaprine (FLEXERIL) 10 MG tablet TAKE 1 TABLET BY MOUTH TWICE A DAY AS NEEDED FOR MUSCLE SPASMS 40 tablet 0  . dabigatran (PRADAXA) 150 MG CAPS capsule Take 1 capsule (150 mg total) by mouth 2 (two) times daily. 180 capsule 3  . gabapentin (NEURONTIN) 300 MG capsule Take 600 mg by mouth 3 (three) times daily.    .  hydroxychloroquine (PLAQUENIL) 200 MG tablet Take 200 mg by mouth daily.    Marland Kitchen leflunomide (ARAVA) 20 MG tablet Take 20 mg by mouth daily.    Marland Kitchen letrozole (FEMARA) 2.5 MG tablet Take 1 tablet (2.5 mg total) by mouth daily. 30 tablet 3  . omeprazole (PRILOSEC) 40 MG capsule Take 1 capsule (40 mg total) by mouth 2 (two) times daily. 90 capsule 1  . ondansetron (ZOFRAN-ODT) 8 MG disintegrating tablet Take by mouth.    . potassium chloride (K-DUR) 10 MEQ tablet Take 10 mEq by mouth daily.    . predniSONE (DELTASONE) 20 MG tablet Take by mouth.    . Tiotropium Bromide Monohydrate (SPIRIVA RESPIMAT) 2.5 MCG/ACT AERS Inhale 2 puffs into the lungs every morning. 1 Inhaler 11  . Topiramate (TOPAMAX PO) Take 50 mg by mouth 2 (two) times daily.     Marland Kitchen torsemide (DEMADEX) 10 MG tablet     . traZODone (DESYREL) 50 MG tablet Take 3 tablets (150 mg total) by mouth at bedtime. [id number] tablet 1  . folic acid (FOLVITE) 1 MG tablet Take by mouth.    Marland Kitchen ibuprofen (ADVIL) 600 MG tablet     . Ipratropium-Albuterol (COMBIVENT) 20-100 MCG/ACT AERS respimat Inhale into the lungs.    . methotrexate 2.5 MG tablet      No current facility-administered medications for this visit.   Facility-Administered Medications Ordered in Other Visits  Medication Dose Route Frequency Provider Last Rate Last Admin  . sodium chloride flush (NS) 0.9 % injection 10 mL  10 mL Intravenous PRN Earlie Server, MD   10 mL at 01/22/19 1503     PHYSICAL EXAMINATION: ECOG PERFORMANCE STATUS: 1 - Symptomatic but completely ambulatory Vitals:   08/22/19 0955  BP: 116/75  Pulse: 89  Temp: (!) 96.3 F (35.7 C)   Filed Weights   08/22/19 0955  Weight: 184 lb 1.6 oz (83.5 kg)    Physical Exam  Constitutional: She is oriented to person, place, and time. No distress.  Obese  HENT:  Head: Normocephalic and atraumatic.  Nose: Nose normal.  Mouth/Throat: Oropharynx is clear and moist. No oropharyngeal exudate.  Eyes: Pupils are equal, round, and  reactive to light. EOM are normal. No scleral icterus.  Cardiovascular: Normal rate and regular rhythm.  No murmur heard. Pulmonary/Chest: Effort normal. No respiratory distress.  Abdominal: Soft. She exhibits no distension. There is no abdominal tenderness.  Musculoskeletal:        General: No edema. Normal range of motion.     Cervical back:  Normal range of motion and neck supple.  Neurological: She is alert and oriented to person, place, and time. No cranial nerve deficit. She exhibits normal muscle tone. Coordination normal.  Skin: Skin is warm and dry. She is not diaphoretic. No erythema.  Psychiatric: Affect normal.     LABORATORY DATA:  I have reviewed the data as listed Lab Results  Component Value Date   WBC 4.9 08/22/2019   HGB 11.2 (L) 08/22/2019   HCT 35.3 (L) 08/22/2019   MCV 92.9 08/22/2019   PLT 119 (L) 08/22/2019   Recent Labs    05/06/19 1002 05/06/19 1002 05/16/19 1411 08/08/19 2256 08/22/19 0925  NA 139   < > 141 140 138  K 4.0   < > 3.7 3.3* 3.9  CL 107   < > 111 105 109  CO2 22   < > 23 23 21*  GLUCOSE 105*   < > 106* 107* 88  BUN 20   < > 13 15 22*  CREATININE 0.75   < > 0.90 0.65 0.75  CALCIUM 9.4   < > 9.3 8.7* 8.7*  GFRNONAA >60   < > >60 >60 >60  GFRAA >60   < > >60 >60 >60  PROT 6.9  --  6.7  --  6.0*  ALBUMIN 4.1  --  4.0  --  3.3*  AST 20  --  23  --  16  ALT 21  --  24  --  22  ALKPHOS 67  --  70  --  56  BILITOT 0.4  --  0.3  --  0.5   < > = values in this interval not displayed.   Iron/TIBC/Ferritin/ %Sat No results found for: IRON, TIBC, FERRITIN, IRONPCTSAT   RADIOGRAPHIC STUDIES: I have personally reviewed the radiological images as listed and agreed with the findings in the report. US venous upper right extremity 06/04/2018  No evidence of DVT within the right upper extremity 07/09/2018 CXR  Interval resolution of subsegmental atelectasis or interstitial edema. Mild chronic bronchitic changes. No acute pneumonia nor pulmonary  edema. Thoracic aortic atherosclerosis  CT Angio Chest PE W and/or Wo Contrast  Result Date: 08/09/2019 CLINICAL DATA:  Shortness of breath. Recurrent DVTs post IVC filter. COVID-19 positive 2 weeks ago with mild symptoms. Chest pain for the past 3 days. EXAM: CT ANGIOGRAPHY CHEST WITH CONTRAST TECHNIQUE: Multidetector CT imaging of the chest was performed using the standard protocol during bolus administration of intravenous contrast. Multiplanar CT image reconstructions and MIPs were obtained to evaluate the vascular anatomy. CONTRAST:  66mL OMNIPAQUE IOHEXOL 350 MG/ML SOLN COMPARISON:  Report from chest radiograph 07/24/2019, images not available. FINDINGS: Cardiovascular: There are no filling defects within the pulmonary arteries to suggest pulmonary embolus. The thoracic aorta is normal in caliber. No aortic dissection. Trace atherosclerosis of the aortic arch and descending thoracic aorta. Right chest port with tip in the lower SVC. Heart is normal in size. No pericardial effusion. Mediastinum/Nodes: No enlarged mediastinal or hilar lymph nodes. No thyroid nodule. Decompressed esophagus. Lungs/Pleura: Multifocal bilateral ground-glass and streaky opacities within both lungs. Slight basilar predominance. No pulmonary edema or septal thickening. No pleural fluid. The trachea and mainstem bronchi are patent. Upper Abdomen: No acute findings. IVC filter partially included. Postcholecystectomy. Musculoskeletal: There are no acute or suspicious osseous abnormalities. Review of the MIP images confirms the above findings. IMPRESSION: 1. No pulmonary embolus. 2. Multifocal bilateral ground-glass and streaky opacities within both lungs, pattern consistent with COVID-19 pneumonia.  Aortic Atherosclerosis (ICD10-I70.0). Electronically Signed   By: Keith Rake M.D.   On: 08/09/2019 01:05    ASSESSMENT & PLAN:  1. History of ductal carcinoma in situ (DCIS) of breast   2. Aromatase inhibitor use   3.  Osteoporosis, unspecified osteoporosis type, unspecified pathological fracture presence   4. History of thrombosis   5. Port-A-Cath in place    history of left DICS.  S/p lumpectomy and RT.  July 2020 bilateral diagnostic mammogram was independently reviewed and discussed with patient.  Negative study.  Continue annual mammogram surveillance. Bilateral diagnostic mammogram is due in December 2020, this was postponed due to COVID-19 pneumonia. Patient has prescription from primary care provider and will schedule. Labs are reviewed and discussed with patient.  Continue letrozole for total 5 years.  #Osteoporosis in the context of chronic aromatase use. Patient has received Zometa in October 2021.  Plan Zometa every 6 months. Recommend patient to continue vitamin D supplementation also recommend her to take calcium 1000 mg daily.  #Port-A-Cath in place due to lack of peripheral access.  Continue port flush every 8 weeks.  #History of DVT and PE, patient is on Pradaxa.  No bleeding events.  Recently she had a CT chest angiogram which showed no pulmonary embolism.  Lung findings are consistent with recent COVID-19. #Chronic anemia and thrombocytopenia, labs reviewed and counts are stable.  Likely secondary to autoimmune disorder/RA/lupus  #Chronic autoimmune disorders/rheumatoid arthritis/lupus: established care with Dr.Bock continue follow-up with rheumatology.   Orders Placed This Encounter  Procedures  . CBC with Differential/Platelet    Standing Status:   Future    Standing Expiration Date:   11/19/2020  . Comprehensive metabolic panel    Standing Status:   Future    Standing Expiration Date:   11/19/2020    All questions were answered. The patient knows to call the clinic with any problems questions or concerns.  Return of visit: 3 months.    Earlie Server, MD, PhD Hematology Oncology [facility] Pager- [phone] 07/19/2018

## 2019-09-01 ENCOUNTER — Ambulatory Visit
Admission: RE | Admit: 2019-09-01 | Discharge: 2019-09-01 | Disposition: A | Discharge status: Home / Self Care | Payer: Medicare Other | Source: Ambulatory Visit | Attending: Internal Medicine | Admitting: Internal Medicine

## 2019-09-01 ENCOUNTER — Other Ambulatory Visit
Admission: RE | Admit: 2019-09-01 | Discharge: 2019-09-01 | Disposition: A | Discharge status: Home / Self Care | Payer: Medicare Other | Source: Ambulatory Visit | Attending: Internal Medicine | Admitting: Internal Medicine

## 2019-09-01 ENCOUNTER — Other Ambulatory Visit: Payer: Self-pay

## 2019-09-01 ENCOUNTER — Other Ambulatory Visit: Payer: Self-pay | Admitting: Internal Medicine

## 2019-09-01 DIAGNOSIS — R7989 Other specified abnormal findings of blood chemistry: Secondary | ICD-10-CM | POA: Diagnosis not present

## 2019-09-01 DIAGNOSIS — R0602 Shortness of breath: Secondary | ICD-10-CM

## 2019-09-01 DIAGNOSIS — J418 Mixed simple and mucopurulent chronic bronchitis: Secondary | ICD-10-CM | POA: Insufficient documentation

## 2019-09-01 LAB — FIBRIN DERIVATIVES D-DIMER (ARMC ONLY): Fibrin derivatives D-dimer (ARMC): 727.74 ng/mL (FEU) — ABNORMAL HIGH (ref 0.00–499.00)

## 2019-09-01 MED ORDER — IOHEXOL 350 MG/ML SOLN
75.0000 mL | Freq: Once | INTRAVENOUS | Status: AC | PRN
  Administered 2019-09-01: 75 mL via INTRAVENOUS

## 2019-09-01 MED ORDER — HEPARIN SOD (PORK) LOCK FLUSH 100 UNIT/ML IV SOLN
500.0000 [IU] | INTRAVENOUS | Status: AC | PRN
  Administered 2019-09-01: 500 [IU]
  Filled 2019-09-01: qty 5

## 2019-09-01 MED ORDER — SODIUM CHLORIDE 0.9% FLUSH
10.0000 mL | INTRAVENOUS | Status: DC | PRN
  Administered 2019-09-01: 10 mL

## 2019-09-01 MED ORDER — SODIUM CHLORIDE 0.9% FLUSH: 10.0000 mL | Freq: Two times a day (BID) | INTRAVENOUS | Status: DC

## 2019-09-01 MED ORDER — CHLORHEXIDINE GLUCONATE CLOTH 2 % EX PADS: 6.0000 | MEDICATED_PAD | Freq: Every day | CUTANEOUS | Status: DC

## 2019-09-02 ENCOUNTER — Ambulatory Visit
Admission: RE | Admit: 2019-09-02 | Discharge: 2019-09-02 | Disposition: A | Discharge status: Home / Self Care | Payer: Medicare Other | Source: Ambulatory Visit | Attending: Internal Medicine | Admitting: Internal Medicine

## 2019-09-02 DIAGNOSIS — Z853 Personal history of malignant neoplasm of breast: Secondary | ICD-10-CM

## 2019-09-02 DIAGNOSIS — Z1231 Encounter for screening mammogram for malignant neoplasm of breast: Secondary | ICD-10-CM

## 2019-09-23 ENCOUNTER — Ambulatory Visit: Payer: Medicare Other | Admitting: Occupational Therapy

## 2019-10-02 ENCOUNTER — Ambulatory Visit: Payer: Medicare Other | Attending: Internal Medicine | Admitting: Occupational Therapy

## 2019-10-02 ENCOUNTER — Other Ambulatory Visit: Payer: Self-pay

## 2019-10-02 DIAGNOSIS — I890 Lymphedema, not elsewhere classified: Secondary | ICD-10-CM | POA: Diagnosis present

## 2019-10-03 NOTE — Therapy (Signed)
[place] MAIN [facility] SERVICES [address] [place], [place], [address] Phone: [phone]   Fax:  [phone]  Occupational Therapy Treatment Note and Discharge Summary: Lymphedema Care  Patient Details  Name: Victoria [name] MRN: [id number] Date of Birth: Jun 19, 1961 Referring Provider (OT): Marlowe Sax, MD   Encounter Date: 10/02/2019  OT End of Session - 10/03/19 0941    Visit Number  30    Number of Visits  36    Date for OT Re-Evaluation  08/11/19    OT Start Time  1106    OT Stop Time  1212    OT Time Calculation (min)  66 min    Activity Tolerance  Patient tolerated treatment well;No increased pain    Behavior During Therapy  WFL for tasks assessed/performed       Past Medical History:  Diagnosis Date  . Asthma   . Cancer ([place])    Breast Left, DCIS 2018  . Collagen vascular disease (H[facility])    Lupus  . COPD (chronic obstructive pulmonary disease) ([place])   . Diverticulitis   . Fibromyalgia   . GERD (gastroesophageal reflux disease)   . Hearing impaired person, left   . IBS (irritable bowel syndrome)   . Lupus ([place])   . Mitral valve disorder   . MS (multiple sclerosis) ([place])   . Neuromuscular disorder ([place])   . Osteoarthritis   . Osteoporosis 05/12/2019  . Personal history of radiation therapy 2017   left   . Rheumatoid arthritis ([place])   . Seizures ([place])     Past Surgical History:  Procedure Laterality Date  . ABDOMINAL HYSTERECTOMY    . APPENDECTOMY    . BREAST EXCISIONAL BIOPSY Left 2017   lumpectomy with radation  . BREAST EXCISIONAL BIOPSY Left 2004   neg surgical bx   . BREAST LUMPECTOMY Left 2017   DCIS  . BREAST SURGERY    . CHOLECYSTECTOMY    . COLONOSCOPY WITH PROPOFOL N/A 07/07/2019   Procedure: COLONOSCOPY WITH PROPOFOL;  Surgeon: Toledo, Benay Pike, MD;  Location: [facility] ENDOSCOPY;  Service: Gastroenterology;  Laterality: N/A;  . ESOPHAGOGASTRODUODENOSCOPY (EGD) WITH PROPOFOL N/A 07/07/2019   Procedure: ESOPHAGOGASTRODUODENOSCOPY (EGD) WITH PROPOFOL;  Surgeon: Toledo, Benay Pike, MD;  Location: [facility] ENDOSCOPY;  Service: Gastroenterology;  Laterality: N/A;  . PATENT DUCTUS ARTERIOUS REPAIR    . RECONSTRUCTION TENDON PULLEY HAND Left   . TOTAL HIP ARTHROPLASTY Right     There were no vitals filed for this visit.  Subjective Assessment - 10/02/19 1115    Subjective   [name] presents for OT visit 29/36 to address BLE lipo-lymphedema. Pt was last seen on 06/18/2019 for lymphedema care and she returns today after transitioning to self-care phase for the past few months. Pt reports  she tested positive for the Covid 19 in mid-dec and was sick for a month at home through Jan 13 when pneumonia cleared up.    Pertinent History  Complex medical history includes several systemic, inflammatory conditions, including lupus, OA, RA, fibromyalgia. Complex medical history also includes additional contributing conditions including obesity,  hx recurrent DVT and single PE, hx LE cellulitis, asthma and COPD , Hx L breast Ca w SNLB, (Stage 0/ sub clinical LUE/LUQ lymphedema)  partial mastectomy and XRT (2017), MS, and R THA 2009.    Limitations  chronic B leg swelling and associated pain, difficulty walking, impaired transfers and functional mobility, decreased balance,, decreaased joint AROM 2/2 sweling at hips, knees ankles and toes (  difficulty bending to reach feet to perform LB bathing, skin and nail care,  and LB dressing) impaired activity tolerance  for activities requiring standing and/or walking > 20 minutes, (shopping, home management chores, cooking). Impaired body image. Impaired social participation    Repetition  Increases Symptoms    Special Tests  + Stemmer sign B feet    Pain Onset  Other (comment)   chronic         LYMPHEDEMA/ONCOLOGY QUESTIONNAIRE - 10/03/19 0954      Right Lower Extremity Lymphedema   Other  RLE A-D volume = 3350.84 ml.     Other  RLE A-D volume is decreased  by 5.5% since last measured on 07/01/19. Total RLE  limb volume reduction below the knee  measures 12.94%. %,       Left Lower Extremity Lymphedema   Other  LLE A-D volume( ankle to tibial tuberosity-leg)  = 3701.31 ml.     Other  LLE A-D volume is decreased by 5.5% since last measured on 07/01/19. Total LLE  llimb volume reduction measures 21.11% below te L knee since commencing OT for CDT  in June 2020.              OT Treatments/Exercises (OP) - 10/03/19 0001      ADLs   ADL Education Given  Yes      Manual Therapy   Manual Therapy  Edema management;Manual Lymphatic Drainage (MLD);Compression Bandaging    Edema Management  BLE comparative limb volumetrics    Manual Lymphatic Drainage (MLD)  skin care w/ low ph Eucerin lotion to  increase hydration and skin mobility    Compression Bandaging  BLE Circaid after session with max A to save time and limit back pain after session             OT Education - 10/03/19 0937    Education Details  Continued skilled Pt/caregiver education  And LE ADL training throughout visit for lymphedema self care/ home program, including compression wrapping, compression garment and device wear/care, lymphatic pumping ther ex, simple self-MLD, and skin care. Discussed progress towards goals.    Person(s) Educated  Patient    Methods  Explanation;Demonstration;Handout    Comprehension  Verbalized understanding;Returned demonstration;Need further instruction          OT Long Term Goals - 10/03/19 0950      OT LONG TERM GOAL #1   Title  Pt will demonstrate modified independance with lymphedema (LE) precautions and prevention strategies by identifying 6  items from list provided in LE Workbook  by 4th OT Rx visit to limit LE progression.    Baseline  Max A MET 02/23/19    Time  4   4th OT rx visit   Period  Days    Status  Achieved      OT LONG TERM GOAL #2   Title  Pt will be able to apply  knee length, multi-layer, short stretch  compression wraps using correct gradient techniques with mod CG assistance  to achieve optimal limb volume reduction, and to return affected limb , as closely as possible, to premorbid size and shape, to limit infection risk and to improve tissue integrity.    Baseline  dependent 02/23/19 GOAL MET    Time  4    Period  Days    Status  Achieved      OT LONG TERM GOAL #3   Title  Pt to achieve at least 10% limb volume reduction below  knees bilaterally  during Intensive Phase CDT  to improve tissue elasticity needed for optimal lymphatic function and joint AROM, , to return limbs to premorbid size and shape, to improve functional transfers and mobility, and to decrease infection risk.    Baseline  Max A Met for LLE. Partially met for RLE    Time  12    Period  Weeks    Status  Achieved      OT LONG TERM GOAL #4   Title  Pt will achieve  100% compliance with daily LE self-care program with mod  caregiver assistance throughout Intensive Phase CDT, including daily  skin care, lymphatic pumping therex, simple self-MLD and prescribe compression to ensure optimal limb volume reduction, to reduce infection risk and to limit LE progression for optimal functional performance in all occupational domains.    Baseline  Max A    Time  12    Period  Weeks    Status  Achieved      OT LONG TERM GOAL #5   Title  Pt will demonstrate modified independence using assistive devices and extra time to don and doff properly fitting, custom compression garments/devices for optimal LE self-management over time.    Baseline  Max A    Time  12    Period  Weeks    Status  Achieved      OT LONG TERM GOAL #6   Title  Pt will retain clinical BLE limb volume reductions over 6 months following intensive phase CDT ( management phase) with mod A from CG and by Pt consistently following all home program components daily to limit LE progression and further functional decline.    Baseline  Dependent    Time  6    Period  Months     Status  On-going            Plan - 10/03/19 1000    Clinical Impression Statement  Completed BLE comparative limb volumetrics today. RLE A-D volume is decreased by 5.5% since last measured on 07/01/19. Total RLE  limb volume reduction below the knee  measures 12.94%. LLE A-D volume is decreased by 5.5% since last measured on 07/01/19. Total LLE  llimb volume reduction measures 21.11% below te L knee since commencing OT for CDT  in June 2020.Marland Kitchen Pt has met all OT goals for CDT. She is DC from OT fo lymphedema care. She agrees to call   / email with questions or concerns, and she will return when existing compression garments need to be replaced. It has been my pleasure to assist Ms. Carlisle Cater w/ LE management. I wish her the best.       Patient will benefit from skilled therapeutic intervention in order to improve the following deficits and impairments:           Visit Diagnosis: Lymphedema, not elsewhere classified    Problem List Patient Active Problem List   Diagnosis Date Noted  . Vitamin D deficiency 08/22/2019  . History of thrombosis 08/22/2019  . Chronic fatigue 07/17/2019  . Osteoporosis 05/12/2019  . STEC (Shiga toxin-producing Escherichia coli) infection 04/06/2019  . Diverticulitis 04/06/2019  . Thrombocytopenia ([place]) 01/23/2019  . Leukopenia 01/23/2019  . Lymphedema of both lower extremities 01/16/2019  . Polyarthralgia 10/14/2018  . Headache disorder 10/14/2018  . Chronic daily headache 10/11/2018  . COPD (chronic obstructive pulmonary disease) ([place]) 10/11/2018  . Thoracic aortic atherosclerosis ([place]) 10/11/2018  . Multiple episodes of deep venous thrombosis ([place]) 09/17/2018  .  IBS (irritable bowel syndrome) 09/17/2018  . Healthcare maintenance 09/17/2018  . Primary osteoarthritis of right hip 09/03/2018  . Presence of right hip implant 09/03/2018  . Morbid obesity with BMI of 45.0-49.9, adult ([place]) 08/23/2018  . Shortness of breath 08/22/2018  . History  of ductal carcinoma in situ (DCIS) of breast 07/20/2018  . Aromatase inhibitor use 07/20/2018  . Abnormal findings on auscultation 07/16/2018  . Encounter for long-term (current) use of high-risk medication 07/16/2018  . Fibromyalgia 07/16/2018  . Other forms of systemic lupus erythematosus ([place]) 07/16/2018  . Other osteoporosis without current pathological fracture 07/16/2018  . 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 [place], [place], [address] Phone: [phone]   Fax:  [phone]  Name: Keeya Dyckman MRN: [id number] Date of Birth: 02-05-1961

## 2019-10-06 ENCOUNTER — Other Ambulatory Visit: Payer: Self-pay | Admitting: Student in an Organized Health Care Education/Training Program

## 2019-10-06 DIAGNOSIS — G894 Chronic pain syndrome: Secondary | ICD-10-CM

## 2019-10-08 ENCOUNTER — Encounter: Payer: Self-pay | Admitting: Student in an Organized Health Care Education/Training Program

## 2019-10-09 ENCOUNTER — Encounter: Payer: Self-pay | Admitting: Student in an Organized Health Care Education/Training Program

## 2019-10-09 ENCOUNTER — Ambulatory Visit
Payer: Medicare Other | Attending: Student in an Organized Health Care Education/Training Program | Admitting: Student in an Organized Health Care Education/Training Program

## 2019-10-09 ENCOUNTER — Other Ambulatory Visit: Payer: Self-pay

## 2019-10-09 DIAGNOSIS — G894 Chronic pain syndrome: Secondary | ICD-10-CM

## 2019-10-09 DIAGNOSIS — M5416 Radiculopathy, lumbar region: Secondary | ICD-10-CM

## 2019-10-09 DIAGNOSIS — M2550 Pain in unspecified joint: Secondary | ICD-10-CM

## 2019-10-09 DIAGNOSIS — M1380 Other specified arthritis, unspecified site: Secondary | ICD-10-CM

## 2019-10-09 DIAGNOSIS — M797 Fibromyalgia: Secondary | ICD-10-CM

## 2019-10-09 MED ORDER — HYDROCODONE-ACETAMINOPHEN 7.5-325 MG PO TABS: 1.0000 | ORAL_TABLET | Freq: Two times a day (BID) | ORAL | 0 refills | Status: DC | PRN

## 2019-10-09 NOTE — Progress Notes (Signed)
Patient: Victoria [name]  Service Category: E/M  Provider: Gillis Santa, MD  DOB: Feb 26, 1961  DOS: 10/09/2019  Location: Office  MRN: [id number]  Setting: Ambulatory outpatient  Referring Provider: Kirk Ruths, MD  Type: Established Patient  Specialty: Interventional Pain Management  PCP: Kirk Ruths, MD  Location: Home  Delivery: TeleHealth     Virtual Encounter - Pain Management PROVIDER NOTE: Information contained herein reflects review and annotations entered in association with encounter. Interpretation of such information and data should be left to medically-trained personnel. Information provided to patient can be located elsewhere in the medical record under "Patient Instructions". Document created using STT-dictation technology, any transcriptional errors that may result from process are unintentional.    Contact & Pharmacy Preferred: [phone] Home: [phone] (home) Mobile: [phone] (mobile) E-mail: [email or web address]  [place], [place] [address] [place] [address] Phone: [phone] Fax: [phone]   Pre-screening  Victoria [name] offered "in-person" vs "virtual" encounter. She indicated preferring virtual for this encounter.   Reason COVID-19*  Social distancing based on CDC and AMA recommendations.   I contacted Victoria [name] on 10/09/2019 via telephone.      I clearly identified myself as Gillis Santa, MD. I verified that I was speaking with the correct person using two identifiers (Name: Victoria [name], and date of birth: [birth year]).  This visit was completed via telephone due to the restrictions of the COVID-19 pandemic. All issues as above were discussed and addressed but no physical exam was performed. If it was felt that the patient should be evaluated in the office, they were directed there. The patient verbally consented to this visit. Patient was unable to complete an audio/visual visit  due to Technical difficulties and/or Lack of internet. Due to the catastrophic nature of the COVID-19 pandemic, this visit was done through audio contact only.  Location of the patient: home address (see Epic for details)  Location of the provider: office  Consent I sought verbal advanced consent from Victoria [name] for virtual visit interactions. I informed Victoria [name] of possible security and privacy concerns, risks, and limitations associated with providing "not-in-person" medical evaluation and management services. I also informed Victoria [name] of the availability of "in-person" appointments. Finally, I informed her that there would be a charge for the virtual visit and that she could be  personally, fully or partially, financially responsible for it. Victoria [name] expressed understanding and agreed to proceed.   Historic Elements   Victoria [name] is a 59 y.o. year old, female patient evaluated today after her last contact with our practice on 07/15/2019. Victoria [name]  has a past medical history of Asthma, Cancer ([place]), Collagen vascular disease ([place]), COPD (chronic obstructive pulmonary disease) ([place]), Diverticulitis, Fibromyalgia, GERD (gastroesophageal reflux disease), Hearing impaired person, left, IBS (irritable bowel syndrome), Lupus ([place]), Mitral valve disorder, MS (multiple sclerosis) ([place]), Neuromuscular disorder ([place]), Osteoarthritis, Osteoporosis (05/12/2019), Personal history of radiation therapy (2017), Rheumatoid arthritis ([place]), and Seizures ([place]). She also  has a past surgical history that includes Breast surgery; Abdominal hysterectomy; Appendectomy; Cholecystectomy; Patent ductus arterious repair; Total hip arthroplasty (Right); Reconstruction tendon pulley hand (Left); Breast excisional biopsy (Left, 2017); Breast excisional biopsy (Left, 2004); Esophagogastroduodenoscopy (egd) with propofol (N/A, 07/07/2019); Colonoscopy with propofol (N/A, 07/07/2019); and Breast lumpectomy (Left,  2017). Victoria [name] has a current medication list which includes the following prescription(s): albuterol, atorvastatin, benzonatate, budesonide-formoterol, cyclobenzaprine, dabigatran, folic acid, gabapentin, hydroxychloroquine, ibuprofen, ipratropium-albuterol, leflunomide, letrozole, methotrexate, omeprazole, ondansetron, pantoprazole, potassium chloride, prednisone, tiotropium  bromide monohydrate, topiramate, torsemide, trazodone, [START ON 10/23/2019] hydrocodone-acetaminophen, [START ON 11/22/2019] hydrocodone-acetaminophen, [START ON 12/22/2019] hydrocodone-acetaminophen, and topiramate, and the following Facility-Administered Medications: sodium chloride flush. She  reports that she quit smoking about 20 years ago. Her smoking use included cigarettes. She has a 30.00 pack-year smoking history. She has never used smokeless tobacco. She reports current alcohol use. She reports that she does not use drugs. Victoria [name] is allergic to sodium hypochlorite; tape; codeine; flagyl [metronidazole]; levofloxacin; and sulfa antibiotics.   HPI  Today, she is being contacted for medication management.   Saw Orthopedics for left knee and right hip pain. Received right GTB injection as well as left knee steroid injection. Informed patient of policy regarding pain management contract that specifies any elective pain related procedures to be done at Pain Clinic.  Patient continues multimodal pain regimen as prescribed.  States that it provides pain relief and improvement in functional status.  Pharmacotherapy Assessment  Analgesic: 09/23/2019  1   07/15/2019  Hydrocodone-Acetamin 7.5-325  45.00  23 Bi Lat   [id number]   Gib (4800)   0  14.67 MME  Medicare   [place]   Monitoring: [place] PMP: PDMP reviewed during this encounter.       Pharmacotherapy: No side-effects or adverse reactions reported. Compliance: No problems identified. Effectiveness: Clinically acceptable. Plan: Refer to "POC".  UDS:  Victoria [name] states that she  submitted urine sample past Monday, will discuss with lab member. Summary  Date Value Ref Range Status  10/21/2018 FINAL  Final    Comment:    ==================================================================== TOXASSURE COMP DRUG ANALYSIS,UR ==================================================================== Test                             Result       Flag       Units Drug Present   Gabapentin                     PRESENT   Topiramate                     PRESENT   Cyclobenzaprine                PRESENT   Desmethylcyclobenzaprine       PRESENT    Desmethylcyclobenzaprine is an expected metabolite of    cyclobenzaprine.   Trazodone                      PRESENT   1,3 chlorophenyl piperazine    PRESENT    1,3-chlorophenyl piperazine is an expected metabolite of    trazodone. ==================================================================== Test                      Result    Flag   Units      Ref Range   Creatinine              127              mg/dL      >=20 ==================================================================== Declared Medications:  Medication list was not provided. ==================================================================== For clinical consultation, please call [phone]. ====================================================================    Laboratory Chemistry Profile   Renal Lab Results  Component Value Date   BUN 22 (H) 08/22/2019   CREATININE 0.75 08/22/2019   GFRAA >60 08/22/2019   GFRNONAA >60 08/22/2019    Hepatic Lab Results  Component Value Date   AST  16 08/22/2019   ALT 22 08/22/2019   ALBUMIN 3.3 (L) 08/22/2019   ALKPHOS 56 08/22/2019    Electrolytes Lab Results  Component Value Date   NA 138 08/22/2019   K 3.9 08/22/2019   CL 109 08/22/2019   CALCIUM 8.7 (L) 08/22/2019    Bone No results found for: VD25OH, NL976BH4LPF, XT0240XB3, ZH2992EQ6, 25OHVITD1, 25OHVITD2, 25OHVITD3, TESTOFREE, TESTOSTERONE  Inflammation  (CRP: Acute Phase) (ESR: Chronic Phase) Lab Results  Component Value Date   CRP <0.8 09/12/2018   ESRSEDRATE 16 09/12/2018      Note: Above Lab results reviewed.  Imaging  MM DIAG BREAST TOMO BILATERAL CLINICAL DATA:  Status post left lumpectomy and radiation therapy for ductal carcinoma in situ in 2017.  EXAM: DIGITAL DIAGNOSTIC BILATERAL MAMMOGRAM WITH CAD AND TOMO  COMPARISON:  Previous exam(s).  ACR Breast Density Category b: There are scattered areas of fibroglandular density.  FINDINGS: Stable post lumpectomy changes on the left. No interval findings suspicious for malignancy in either breast.  Mammographic images were processed with CAD.  IMPRESSION: No evidence of malignancy.  RECOMMENDATION: Bilateral diagnostic mammogram in 1 year.  I have discussed the findings and recommendations with the patient. If applicable, a reminder letter will be sent to the patient regarding the next appointment.  BI-RADS CATEGORY  2: Benign.  Electronically Signed   By: Claudie Revering M.D.   On: 09/02/2019 12:16  Assessment  The primary encounter diagnosis was Chronic pain syndrome. Diagnoses of Lumbar radiculopathy, Fibromyalgia, Polyarthralgia, and Seronegative arthritis were also pertinent to this visit.  Plan of Care  Ms. Kenadie Royce has a current medication list which includes the following long-term medication(s): albuterol, atorvastatin, budesonide-formoterol, dabigatran, gabapentin, leflunomide, omeprazole, pantoprazole, potassium chloride, tiotropium bromide monohydrate, topiramate, torsemide, trazodone, and topiramate.  Pharmacotherapy (Medications Ordered): Meds ordered this encounter  Medications  . HYDROcodone-acetaminophen ([place]) 7.5-325 MG tablet    Sig: Take 1 tablet by mouth 2 (two) times daily as needed for moderate pain.    Dispense:  45 tablet    Refill:  0    For chronic pain syndrome. To last 30 days from previous fill date  .  HYDROcodone-acetaminophen (NORCO) 7.5-325 MG tablet    Sig: Take 1 tablet by mouth 2 (two) times daily as needed for moderate pain.    Dispense:  45 tablet    Refill:  0    For chronic pain syndrome. To last 30 days from previous fill date  . HYDROcodone-acetaminophen (NORCO) 7.5-325 MG tablet    Sig: Take 1 tablet by mouth 2 (two) times daily as needed for moderate pain.    Dispense:  45 tablet    Refill:  0    For chronic pain syndrome. To last 30 days from previous fill date    Follow-up plan:   Return in about 3 months (around 01/09/2020) for in person, Medication Management.    Recent Visits Date Type Provider Dept  07/15/19 Office Visit Gillis Santa, MD [facility]  Showing recent visits within past 90 days and meeting all other requirements   Today's Visits Date Type Provider Dept  10/09/19 Office Visit Gillis Santa, MD [facility]  Showing today's visits and meeting all other requirements   Future Appointments No visits were found meeting these conditions.  Showing future appointments within next 90 days and meeting all other requirements   I discussed the assessment and treatment plan with the patient. The patient was provided an opportunity to ask questions and all were answered.  The patient agreed with the plan and demonstrated an understanding of the instructions.  Patient advised to call back or seek an in-person evaluation if the symptoms or condition worsens.  Duration of encounter: 25 minutes.  Note by: Gillis Santa, MD Date: 10/09/2019; Time: 10:24 AM

## 2019-10-10 LAB — TOXASSURE SELECT 13 (MW), URINE

## 2019-10-11 ENCOUNTER — Other Ambulatory Visit: Payer: Self-pay | Admitting: Oncology

## 2019-10-11 DIAGNOSIS — Z95828 Presence of other vascular implants and grafts: Secondary | ICD-10-CM

## 2019-11-18 ENCOUNTER — Other Ambulatory Visit: Payer: Self-pay

## 2019-11-18 ENCOUNTER — Encounter: Payer: Self-pay | Admitting: Oncology

## 2019-11-18 ENCOUNTER — Inpatient Hospital Stay (HOSPITAL_BASED_OUTPATIENT_CLINIC_OR_DEPARTMENT_OTHER): Payer: Medicare Other | Admitting: Oncology

## 2019-11-18 ENCOUNTER — Inpatient Hospital Stay: Payer: Medicare Other | Attending: Oncology | Admitting: *Deleted

## 2019-11-18 ENCOUNTER — Inpatient Hospital Stay: Payer: Medicare Other

## 2019-11-18 VITALS — BP 151/75 | HR 76 | Temp 96.1°F | Resp 18 | Wt 189.0 lb

## 2019-11-18 DIAGNOSIS — Z86000 Personal history of in-situ neoplasm of breast: Secondary | ICD-10-CM

## 2019-11-18 DIAGNOSIS — Z95828 Presence of other vascular implants and grafts: Secondary | ICD-10-CM | POA: Diagnosis not present

## 2019-11-18 DIAGNOSIS — G35 Multiple sclerosis: Secondary | ICD-10-CM | POA: Insufficient documentation

## 2019-11-18 DIAGNOSIS — M810 Age-related osteoporosis without current pathological fracture: Secondary | ICD-10-CM

## 2019-11-18 DIAGNOSIS — D0512 Intraductal carcinoma in situ of left breast: Secondary | ICD-10-CM | POA: Diagnosis not present

## 2019-11-18 DIAGNOSIS — Z923 Personal history of irradiation: Secondary | ICD-10-CM | POA: Insufficient documentation

## 2019-11-18 DIAGNOSIS — Z79899 Other long term (current) drug therapy: Secondary | ICD-10-CM | POA: Diagnosis not present

## 2019-11-18 DIAGNOSIS — Z87891 Personal history of nicotine dependence: Secondary | ICD-10-CM | POA: Insufficient documentation

## 2019-11-18 DIAGNOSIS — Z79811 Long term (current) use of aromatase inhibitors: Secondary | ICD-10-CM

## 2019-11-18 DIAGNOSIS — M329 Systemic lupus erythematosus, unspecified: Secondary | ICD-10-CM | POA: Diagnosis not present

## 2019-11-18 DIAGNOSIS — Z86718 Personal history of other venous thrombosis and embolism: Secondary | ICD-10-CM | POA: Insufficient documentation

## 2019-11-18 DIAGNOSIS — D649 Anemia, unspecified: Secondary | ICD-10-CM | POA: Diagnosis not present

## 2019-11-18 DIAGNOSIS — Z86711 Personal history of pulmonary embolism: Secondary | ICD-10-CM | POA: Insufficient documentation

## 2019-11-18 DIAGNOSIS — Z7901 Long term (current) use of anticoagulants: Secondary | ICD-10-CM | POA: Diagnosis not present

## 2019-11-18 DIAGNOSIS — M069 Rheumatoid arthritis, unspecified: Secondary | ICD-10-CM | POA: Diagnosis not present

## 2019-11-18 LAB — CBC WITH DIFFERENTIAL/PLATELET
Abs Immature Granulocytes: 0.01 10*3/uL (ref 0.00–0.07)
Basophils Absolute: 0 10*3/uL (ref 0.0–0.1)
Basophils Relative: 1 %
Eosinophils Absolute: 0 10*3/uL (ref 0.0–0.5)
Eosinophils Relative: 0 %
HCT: 35.9 % — ABNORMAL LOW (ref 36.0–46.0)
Hemoglobin: 11.8 g/dL — ABNORMAL LOW (ref 12.0–15.0)
Immature Granulocytes: 0 %
Lymphocytes Relative: 16 %
Lymphs Abs: 0.9 10*3/uL (ref 0.7–4.0)
MCH: 31.1 pg (ref 26.0–34.0)
MCHC: 32.9 g/dL (ref 30.0–36.0)
MCV: 94.5 fL (ref 80.0–100.0)
Monocytes Absolute: 0.2 10*3/uL (ref 0.1–1.0)
Monocytes Relative: 4 %
Neutro Abs: 4.5 10*3/uL (ref 1.7–7.7)
Neutrophils Relative %: 79 %
Platelets: 155 10*3/uL (ref 150–400)
RBC: 3.8 MIL/uL — ABNORMAL LOW (ref 3.87–5.11)
RDW: 14.7 % (ref 11.5–15.5)
WBC: 5.7 10*3/uL (ref 4.0–10.5)
nRBC: 0 % (ref 0.0–0.2)

## 2019-11-18 LAB — COMPREHENSIVE METABOLIC PANEL
ALT: 36 U/L (ref 0–44)
AST: 33 U/L (ref 15–41)
Albumin: 3.9 g/dL (ref 3.5–5.0)
Alkaline Phosphatase: 46 U/L (ref 38–126)
Anion gap: 10 (ref 5–15)
BUN: 14 mg/dL (ref 6–20)
CO2: 22 mmol/L (ref 22–32)
Calcium: 9.2 mg/dL (ref 8.9–10.3)
Chloride: 108 mmol/L (ref 98–111)
Creatinine, Ser: 0.7 mg/dL (ref 0.44–1.00)
GFR calc Af Amer: >60 mL/min (ref >60)
GFR calc non Af Amer: >60 mL/min (ref >60)
Glucose, Bld: 183 mg/dL — ABNORMAL HIGH (ref 70–99)
Potassium: 3.5 mmol/L (ref 3.5–5.1)
Sodium: 140 mmol/L (ref 135–145)
Total Bilirubin: 0.4 mg/dL (ref 0.3–1.2)
Total Protein: 6.4 g/dL — ABNORMAL LOW (ref 6.5–8.1)

## 2019-11-18 MED ORDER — SODIUM CHLORIDE 0.9% FLUSH
10.0000 mL | Freq: Once | INTRAVENOUS | Status: AC
  Administered 2019-11-18: 10 mL via INTRAVENOUS
  Filled 2019-11-18: qty 10

## 2019-11-18 MED ORDER — HEPARIN SOD (PORK) LOCK FLUSH 100 UNIT/ML IV SOLN
500.0000 [IU] | Freq: Once | INTRAVENOUS | Status: AC
  Administered 2019-11-18: 500 [IU] via INTRAVENOUS
  Filled 2019-11-18: qty 5

## 2019-11-18 MED ORDER — HEPARIN SOD (PORK) LOCK FLUSH 100 UNIT/ML IV SOLN
500.0000 [IU] | Freq: Once | INTRAVENOUS | Status: AC
  Administered 2019-11-18: 15:00:00 500 [IU] via INTRAVENOUS
  Filled 2019-11-18: qty 5

## 2019-11-18 NOTE — Progress Notes (Signed)
Patient here for follow up. Currently on antibiotic for tooth infection. No new breast problems.

## 2019-11-18 NOTE — Progress Notes (Signed)
Hematology/Oncology follow up note [facility] Telephone:([phone] [phone]   Patient Care Team: Kirk Ruths, MD as PCP - General (Internal Medicine)  REFERRING PROVIDER: Kirk Ruths, MD CHIEF COMPLAINTS/REASON FOR VISIT:  Follow up for DCIS HISTORY OF PRESENTING ILLNESS:  Victoria [name] is a  59 y.o.  female with PMH listed below who was referred to me for evaluation of history of breast cancer Patient moved from [place]. Also report having left lower extremity blood clot 3 years ago, started on Eliquis for a month and developed pulmonary embolism.  Switch to Pradaxa.  She also had IVC filter.  After she moved to [place], she was temporarily off Pradaxa.  Currently back on anticoagulation. She presented to emergency room on 06/25/2018 for evaluation of right arm pain, complaining about developing knots right upper extremity.  # DCIS:  I obtained previous oncology history from [place]. Medical records were reviewed by me.  Patient was diagnosed with left breast DCIS in 2017, s/p lumpectomy and radiation.  Initially was started on Arimidex however cannot tolerate and was switched to Letrozole since Dec 2017.   # rheumatoid arthritis/lupus follows up with Dr. Cydney Ok.  No rashes follow-up with Dr. Carlyn Reichert   # history of unprovoked VTE, with IVC filter,  recurrent, on Pradaxa # [place] left thigh showed most likely lipoma.  She has also established care with Rheumatology. started on Leflunomide 20mg  daily since March 2020.  Also has been on Plaquenil 200mg    # Chronic anemia and thrombocytopenia. Previous work-up includes normal LDH, folate and vitamin B12 level, negative hepatitis panel, HIV negative.  Negative SPEP-[facility].  Normal immature platelet fraction indicating underproduction. # COVID-19 pneumonia in December  INTERVAL HISTORY Victoria [name] is a 59 y.o. female who has above history  reviewed by me today presents for follow up visit for management of history of DCIS.   For DCIS, she takes letrozole, tolerates well. manageable hot flashes.  She has tooth infection and is currently on antibiotics. 09/02/19 Bilateral diagnostic mammogram showed no evidence of malignancy.   Review of Systems  Constitutional: Positive for fatigue. Negative for appetite change, chills and fever.  HENT:   Negative for hearing loss and voice change.   Eyes: Negative for eye problems.  Respiratory: Negative for chest tightness, cough and shortness of breath.   Cardiovascular: Negative for chest pain.  Gastrointestinal: Negative for abdominal distention, abdominal pain and blood in stool.  Endocrine: Positive for hot flashes.  Genitourinary: Negative for difficulty urinating and frequency.   Musculoskeletal: Positive for arthralgias. Negative for back pain and myalgias.  Skin: Negative for itching and rash.  Neurological: Negative for extremity weakness.  Hematological: Negative for adenopathy.  Psychiatric/Behavioral: Negative for confusion.    MEDICAL HISTORY:  Past Medical History:  Diagnosis Date  . Asthma   . Cancer ([place])    Breast Left, DCIS 2018  . Collagen vascular disease (H[facility])    Lupus  . COPD (chronic obstructive pulmonary disease) ([place])   . Diverticulitis   . Fibromyalgia   . GERD (gastroesophageal reflux disease)   . Hearing impaired person, left   . IBS (irritable bowel syndrome)   . Lupus ([place])   . Mitral valve disorder   . MS (multiple sclerosis) ([place])   . Neuromuscular disorder ([place])   . Osteoarthritis   . Osteoporosis 05/12/2019  . Personal history of radiation therapy 2017   left   . Rheumatoid arthritis ([place])   . Seizures ([place])  SURGICAL HISTORY: Past Surgical History:  Procedure Laterality Date  . ABDOMINAL HYSTERECTOMY    . APPENDECTOMY    . BREAST EXCISIONAL BIOPSY Left 2017   lumpectomy with radation  . BREAST EXCISIONAL BIOPSY Left 2004    neg surgical bx   . BREAST LUMPECTOMY Left 2017   DCIS  . BREAST SURGERY    . CHOLECYSTECTOMY    . COLONOSCOPY WITH PROPOFOL N/A 07/07/2019   Procedure: COLONOSCOPY WITH PROPOFOL;  Surgeon: Toledo, Benay Pike, MD;  Location: [facility] ENDOSCOPY;  Service: Gastroenterology;  Laterality: N/A;  . ESOPHAGOGASTRODUODENOSCOPY (EGD) WITH PROPOFOL N/A 07/07/2019   Procedure: ESOPHAGOGASTRODUODENOSCOPY (EGD) WITH PROPOFOL;  Surgeon: Toledo, Benay Pike, MD;  Location: [facility] ENDOSCOPY;  Service: Gastroenterology;  Laterality: N/A;  . PATENT DUCTUS ARTERIOUS REPAIR    . RECONSTRUCTION TENDON PULLEY HAND Left   . TOTAL HIP ARTHROPLASTY Right     SOCIAL HISTORY: Social History   Socioeconomic History  . Marital status: Widowed    Spouse name: Not on file  . Number of children: 1  . Years of education: Not on file  . Highest education level: Some college, no degree  Occupational History  . Not on file  Tobacco Use  . Smoking status: Former Smoker    Packs/day: 2.00    Years: 15.00    Pack years: 30.00    Types: Cigarettes    Quit date: 06/27/1999    Years since quitting: 20.4  . Smokeless tobacco: Never Used  Substance and Sexual Activity  . Alcohol use: Yes    Comment: Rarely - approx 6 beers once a month  . Drug use: Never  . Sexual activity: Not Currently  Other Topics Concern  . Not on file  Social History Narrative  . Not on file   Social Determinants of Health   Financial Resource Strain:   . Difficulty of Paying Living Expenses:   Food Insecurity:   . Worried About Charity fundraiser in the Last Year:   . Arboriculturist in the Last Year:   Transportation Needs:   . Film/video editor (Medical):   Marland Kitchen Lack of Transportation (Non-Medical):   Physical Activity:   . Days of Exercise per Week:   . Minutes of Exercise per Session:   Stress:   . Feeling of Stress :   Social Connections:   . Frequency of Communication with Friends and Family:   . Frequency of Social  Gatherings with Friends and Family:   . Attends Religious Services:   . Active Member of Clubs or Organizations:   . Attends Archivist Meetings:   Marland Kitchen Marital Status:   Intimate Partner Violence:   . Fear of Current or Ex-Partner:   . Emotionally Abused:   Marland Kitchen Physically Abused:   . Sexually Abused:     FAMILY HISTORY: Family History  Problem Relation Age of Onset  . COPD Mother   . Stroke Father   . Arthritis Brother   . Diabetes Brother   . Stroke Brother   . Breast cancer Neg Hx     ALLERGIES:  is allergic to sodium hypochlorite; tape; codeine; flagyl [metronidazole]; levofloxacin; and sulfa antibiotics.  MEDICATIONS:  Current Outpatient Medications  Medication Sig Dispense Refill  . albuterol (PROVENTIL HFA;VENTOLIN HFA) 108 (90 Base) MCG/ACT inhaler Inhale 2 puffs into the lungs every 6 (six) hours as needed for wheezing or shortness of breath. 1 Inhaler 2  . atorvastatin (LIPITOR) 40 MG tablet Take 40 mg by mouth  daily.    . benzonatate (TESSALON) 100 MG capsule Take 1 capsule (100 mg total) by mouth 3 (three) times daily as needed. 30 capsule 0  . budesonide-formoterol (SYMBICORT) 160-4.5 MCG/ACT inhaler Inhale 2 puffs into the lungs 2 (two) times daily. 1 Inhaler 12  . cyclobenzaprine (FLEXERIL) 10 MG tablet TAKE 1 TABLET BY MOUTH TWICE A DAY AS NEEDED FOR MUSCLE SPASMS 40 tablet 0  . dabigatran (PRADAXA) 150 MG CAPS capsule Take 1 capsule (150 mg total) by mouth 2 (two) times daily. [id number] capsule 3  . folic acid (FOLVITE) 1 MG tablet Take by mouth.    . gabapentin (NEURONTIN) 300 MG capsule Take 600 mg by mouth 3 (three) times daily.    Marland Kitchen HYDROcodone-acetaminophen (NORCO) 7.5-325 MG tablet Take 1 tablet by mouth 2 (two) times daily as needed for moderate pain. 45 tablet 0  . hydroxychloroquine (PLAQUENIL) 200 MG tablet Take 200 mg by mouth daily.    . Ipratropium-Albuterol (COMBIVENT) 20-100 MCG/ACT AERS respimat Inhale into the lungs.    Marland Kitchen letrozole (FEMARA) 2.5  MG tablet TAKE 1 TABLET BY MOUTH ONCE DAILY 30 tablet 3  . methotrexate 2.5 MG tablet Take 2.5 mg by mouth once a week.     Marland Kitchen omeprazole (PRILOSEC) 40 MG capsule Take 1 capsule (40 mg total) by mouth 2 (two) times daily. 90 capsule 1  . ondansetron (ZOFRAN-ODT) 8 MG disintegrating tablet Take by mouth.    . pantoprazole (PROTONIX) 40 MG tablet Take by mouth.    . potassium chloride (K-DUR) 10 MEQ tablet Take 10 mEq by mouth daily.    . Tiotropium Bromide Monohydrate (SPIRIVA RESPIMAT) 2.5 MCG/ACT AERS Inhale 2 puffs into the lungs every morning. 1 Inhaler 11  . topiramate (TOPAMAX) 50 MG tablet Take 50 mg by mouth 2 (two) times daily.    Marland Kitchen torsemide (DEMADEX) 10 MG tablet     . traZODone (DESYREL) 50 MG tablet Take 3 tablets (150 mg total) by mouth at bedtime. 180 tablet 1  . amoxicillin (AMOXIL) 500 MG capsule Take 500 mg by mouth 3 (three) times daily.    Derrill Memo ON 11/22/2019] HYDROcodone-acetaminophen (NORCO) 7.5-325 MG tablet Take 1 tablet by mouth 2 (two) times daily as needed for moderate pain. (Patient not taking: Reported on 11/18/2019) 45 tablet 0  . [START ON 12/22/2019] HYDROcodone-acetaminophen (NORCO) 7.5-325 MG tablet Take 1 tablet by mouth 2 (two) times daily as needed for moderate pain. (Patient not taking: Reported on 11/18/2019) 45 tablet 0  . ibuprofen (ADVIL) 600 MG tablet     . leflunomide (ARAVA) 20 MG tablet Take 20 mg by mouth daily.    . predniSONE (DELTASONE) 20 MG tablet Take by mouth.    . Topiramate (TOPAMAX PO) Take 50 mg by mouth 2 (two) times daily.      No current facility-administered medications for this visit.   Facility-Administered Medications Ordered in Other Visits  Medication Dose Route Frequency Provider Last Rate Last Admin  . sodium chloride flush (NS) 0.9 % injection 10 mL  10 mL Intravenous PRN Earlie Server, MD   10 mL at 01/22/19 1503     PHYSICAL EXAMINATION: ECOG PERFORMANCE STATUS: 1 - Symptomatic but completely ambulatory Vitals:   11/18/19  1325  BP: (!) 151/75  Pulse: 76  Resp: 18  Temp: (!) 96.1 F (35.6 C)   Filed Weights   11/18/19 1325  Weight: 189 lb (85.7 kg)    Physical Exam  Constitutional: She is oriented to person, place,  and time. No distress.  Obese  HENT:  Head: Normocephalic and atraumatic.  Nose: Nose normal.  Mouth/Throat: Oropharynx is clear and moist. No oropharyngeal exudate.  Eyes: Pupils are equal, round, and reactive to light. EOM are normal. No scleral icterus.  Cardiovascular: Normal rate and regular rhythm.  No murmur heard. Pulmonary/Chest: Effort normal. No respiratory distress. She has no rales. She exhibits no tenderness.  Abdominal: Soft. She exhibits no distension. There is no abdominal tenderness.  Musculoskeletal:        General: No edema. Normal range of motion.     Cervical back: Normal range of motion and neck supple.  Neurological: She is alert and oriented to person, place, and time. No cranial nerve deficit. She exhibits normal muscle tone. Coordination normal.  Skin: Skin is warm and dry. She is not diaphoretic. No erythema.  Psychiatric: Affect normal.  She declined breast examination at last in person visit.  Breast exam was performed in seated and lying down position. Patient is status post left lumpectomy with a well-healed surgical scar.  Palpable area of concern/mass left lower outer quandrant, 5 o'clock.   LABORATORY DATA:  I have reviewed the data as listed Lab Results  Component Value Date   WBC 5.7 11/18/2019   HGB 11.8 (L) 11/18/2019   HCT 35.9 (L) 11/18/2019   MCV 94.5 11/18/2019   PLT 155 11/18/2019   Recent Labs    05/16/19 1411 05/16/19 1411 08/08/19 2256 08/22/19 0925 11/18/19 1238  NA 141   < > 140 138 140  K 3.7   < > 3.3* 3.9 3.5  CL 111   < > 105 109 108  CO2 23   < > 23 21* 22  GLUCOSE 106*   < > 107* 88 183*  BUN 13   < > 15 22* 14  CREATININE 0.90   < > 0.65 0.75 0.70  CALCIUM 9.3   < > 8.7* 8.7* 9.2  GFRNONAA >60   < > >60 >60  >60  GFRAA >60   < > >60 >60 >60  PROT 6.7  --   --  6.0* 6.4*  ALBUMIN 4.0  --   --  3.3* 3.9  AST 23  --   --  16 33  ALT 24  --   --  22 36  ALKPHOS 70  --   --  56 46  BILITOT 0.3  --   --  0.5 0.4   < > = values in this interval not displayed.   Iron/TIBC/Ferritin/ %Sat No results found for: IRON, TIBC, FERRITIN, IRONPCTSAT   RADIOGRAPHIC STUDIES: I have personally reviewed the radiological images as listed and agreed with the findings in the report. US venous upper right extremity 06/04/2018  No evidence of DVT within the right upper extremity 07/09/2018 CXR  Interval resolution of subsegmental atelectasis or interstitial edema. Mild chronic bronchitic changes. No acute pneumonia nor pulmonary edema. Thoracic aortic atherosclerosis  CT ANGIO CHEST PE W OR WO CONTRAST  Result Date: 09/01/2019 CLINICAL DATA:  Shortness of breath. COVID-19. Elevated D-dimer. EXAM: CT ANGIOGRAPHY CHEST WITH CONTRAST TECHNIQUE: Multidetector CT imaging of the chest was performed using the standard protocol during bolus administration of intravenous contrast. Multiplanar CT image reconstructions and MIPs were obtained to evaluate the vascular anatomy. CONTRAST:  65mL OMNIPAQUE IOHEXOL 350 MG/ML SOLN COMPARISON:  CT angiogram of the chest dated 08/09/2019 FINDINGS: Cardiovascular: Satisfactory opacification of the pulmonary arteries to the segmental level. No evidence of pulmonary embolism. Normal  heart size. No pericardial effusion. Mediastinum/Nodes: No enlarged mediastinal, hilar, or axillary lymph nodes. Thyroid gland, trachea, and esophagus demonstrate no significant findings. Lungs/Pleura: There has been significant improvement in the scattered multifocal bilateral pulmonary infiltrates seen on the prior study. There is slight residual haziness at several sites in both lungs. No effusions. Upper Abdomen: No acute abnormality. Musculoskeletal: No chest wall abnormality. No acute or significant osseous  findings. Review of the MIP images confirms the above findings. IMPRESSION: 1. No pulmonary emboli or other acute abnormalities. 2. Marked improvement in the multifocal bilateral pulmonary infiltrates. There is slight residual haziness at several sites in both lungs. No effusions. No follow-up needed if patient is low-risk. Non-contrast chest CT can be considered in 12 months if patient is high-risk. This recommendation follows the consensus statement: Guidelines for Management of Incidental Pulmonary Nodules Detected on CT Images: From the Fleischner Society 2017; Radiology 2017; 284:228-243. Electronically Signed   By: Lorriane Shire M.D.   On: 09/01/2019 19:17   MM DIAG BREAST TOMO BILATERAL  Result Date: 09/02/2019 CLINICAL DATA:  Status post left lumpectomy and radiation therapy for ductal carcinoma in situ in 2017. EXAM: DIGITAL DIAGNOSTIC BILATERAL MAMMOGRAM WITH CAD AND TOMO COMPARISON:  Previous exam(s). ACR Breast Density Category b: There are scattered areas of fibroglandular density. FINDINGS: Stable post lumpectomy changes on the left. No interval findings suspicious for malignancy in either breast. Mammographic images were processed with CAD. IMPRESSION: No evidence of malignancy. RECOMMENDATION: Bilateral diagnostic mammogram in 1 year. I have discussed the findings and recommendations with the patient. If applicable, a reminder letter will be sent to the patient regarding the next appointment. BI-RADS CATEGORY  2: Benign. Electronically Signed   By: Claudie Revering M.D.   On: 09/02/2019 12:16    ASSESSMENT & PLAN:  1. History of ductal carcinoma in situ (DCIS) of breast   2. Port-A-Cath in place   3. Aromatase inhibitor use   4. Osteoporosis, unspecified osteoporosis type, unspecified pathological fracture presence   5. History of thrombosis    history of left DICS.  S/p lumpectomy and RT.  Left breast outer lower quadrant ? mass, I will obtain left diagnostic breast mammogram.  For now  continue letrozole for total 5 years.  #Osteoporosis in the context of chronic aromatase use. Patient has received Zometa in October 2021.  Due for Zometa I will hold off Zometa due to current tooth infection.  Advise patient to finish antibiotics and call her dentisit to see if she will be cleared for Zometa.  Recommend patient to continue vitamin D supplementation also recommend her to take calcium 1000 mg daily.  #Port-A-Cath in place due to lack of peripheral access.  Continue port flush every 8 weeks.  #History of DVT and PE, patient is on Pradaxa.    #Chronic anemia and her hemoglobin is stable. .  Likely secondary to autoimmune disorder/RA/lupus  #Chronic autoimmune disorders/rheumatoid arthritis/lupus: established care with Dr.Bock continue follow-up with rheumatology.   Orders Placed This Encounter  Procedures  . MM DIAG BREAST TOMO UNI LEFT    Standing Status:   Future    Standing Expiration Date:   11/17/2020    Order Specific Question:   Reason for Exam (SYMPTOM  OR DIAGNOSIS REQUIRED)    Answer:   new breast mass; approx 1 cm @ 5 o'clock    Order Specific Question:   Is the patient pregnant?    Answer:   No    Order Specific Question:   Preferred  imaging location?    Answer:   [facility]  . US Breast Limited Uni Left Inc Axilla    Standing Status:   Future    Standing Expiration Date:   01/17/2021    Order Specific Question:   Reason for Exam (SYMPTOM  OR DIAGNOSIS REQUIRED)    Answer:   new breast mass; approx 1 cm @ 5 o'clock    Order Specific Question:   Preferred imaging location?    Answer:   [facility]    All questions were answered. The patient knows to call the clinic with any problems questions or concerns.  Return of visit: 3 months.   Earlie Server, MD, PhD Hematology Oncology [place] [facility] 11/18/2019

## 2019-11-25 ENCOUNTER — Other Ambulatory Visit: Payer: Self-pay | Admitting: Orthopedic Surgery

## 2019-11-25 DIAGNOSIS — M7061 Trochanteric bursitis, right hip: Secondary | ICD-10-CM

## 2019-11-27 ENCOUNTER — Ambulatory Visit
Admission: RE | Admit: 2019-11-27 | Discharge: 2019-11-27 | Disposition: A | Discharge status: Home / Self Care | Payer: Medicare Other | Source: Ambulatory Visit | Attending: Oncology | Admitting: Oncology

## 2019-11-27 DIAGNOSIS — Z86000 Personal history of in-situ neoplasm of breast: Secondary | ICD-10-CM

## 2019-12-04 ENCOUNTER — Telehealth: Payer: Self-pay

## 2019-12-04 NOTE — Telephone Encounter (Signed)
Called patient to follow up on zometa appt. Left detailed message on pt's voicemail to call us back and let us know if tooth infection has resolved and if she has been cleared by dentist to proceed with pending dose of zometa.

## 2019-12-04 NOTE — Telephone Encounter (Signed)
-----   Message from Evelina Dun, RN sent at 11/18/2019  2:26 PM EDT ----- Regarding: zometa f/u pt seen on 4/13 ; on abt for tooth infection, no zometapt is to call office and let us know if infection resolved and schedule zometa.

## 2019-12-06 ENCOUNTER — Ambulatory Visit
Admission: RE | Admit: 2019-12-06 | Discharge: 2019-12-06 | Disposition: A | Discharge status: Home / Self Care | Payer: Medicare Other | Source: Ambulatory Visit | Attending: Orthopedic Surgery | Admitting: Orthopedic Surgery

## 2019-12-06 ENCOUNTER — Other Ambulatory Visit: Payer: Self-pay

## 2019-12-06 DIAGNOSIS — M7061 Trochanteric bursitis, right hip: Secondary | ICD-10-CM | POA: Diagnosis not present

## 2019-12-11 NOTE — Telephone Encounter (Signed)
Called patient and was sent to straight to voicemail. Detail voicemail was left for pt to contact us.

## 2019-12-11 NOTE — Telephone Encounter (Signed)
Patient returned call and stated that infection has resolved, but the dentist office needed a clearance letter to be faxed so Dentist can sign it. Letter faxed to [facility].  Copy of letter sent to MR to scan in chart.   Ph: [phone] Fx: [phone]

## 2019-12-15 ENCOUNTER — Telehealth: Payer: Self-pay

## 2019-12-15 DIAGNOSIS — Z86000 Personal history of in-situ neoplasm of breast: Secondary | ICD-10-CM

## 2019-12-15 NOTE — Telephone Encounter (Signed)
-----   Message from Earlie Server, MD sent at 12/15/2019 11:40 AM EDT ----- Regarding: RE: DENTAL CLEARANCE Please schedule next available Zometa treatment, also she needs lab encounter, check BMP. Please order BMP  . ----- Message ----- From: Vanice Sarah, CMA Sent: 12/15/2019  11:10 AM EDT To: Evelina Dun, RN, Vanice Sarah, CMA, # Subject: FW: DENTAL CLEARANCE                           Next scheduled appt is 7/13.  When would you like her to come for Zometa? ----- Message ----- From: Bryson Corona D Sent: 12/15/2019  10:51 AM EDT To: Vanice Sarah, CMA Subject: DENTAL CLEARANCE                               Hello,  I just uploaded a DENTAL CLEARANCE  into this pt's chart in Epic under the Media tab. Thanks.

## 2019-12-15 NOTE — Telephone Encounter (Signed)
Please schedule pt for lab/Zometa.

## 2019-12-15 NOTE — Telephone Encounter (Signed)
Done...   Pt has been scheduled for lab/Zometa as requested. A detailed message was left on her vmail making her aware of the  date and time of her scheduled 12/19/19 appt.

## 2019-12-18 ENCOUNTER — Other Ambulatory Visit: Payer: Self-pay | Admitting: Orthopedic Surgery

## 2019-12-18 DIAGNOSIS — M5441 Lumbago with sciatica, right side: Secondary | ICD-10-CM

## 2019-12-19 ENCOUNTER — Inpatient Hospital Stay: Payer: Medicare Other | Attending: Oncology

## 2019-12-19 ENCOUNTER — Other Ambulatory Visit: Payer: Self-pay

## 2019-12-19 ENCOUNTER — Inpatient Hospital Stay: Payer: Medicare Other

## 2019-12-19 VITALS — BP 115/71 | HR 74 | Temp 96.7°F | Resp 18

## 2019-12-19 DIAGNOSIS — Z86000 Personal history of in-situ neoplasm of breast: Secondary | ICD-10-CM

## 2019-12-19 DIAGNOSIS — M810 Age-related osteoporosis without current pathological fracture: Secondary | ICD-10-CM

## 2019-12-19 DIAGNOSIS — M818 Other osteoporosis without current pathological fracture: Secondary | ICD-10-CM | POA: Diagnosis present

## 2019-12-19 DIAGNOSIS — Z95828 Presence of other vascular implants and grafts: Secondary | ICD-10-CM

## 2019-12-19 LAB — BASIC METABOLIC PANEL
Anion gap: 6 (ref 5–15)
BUN: 14 mg/dL (ref 6–20)
CO2: 22 mmol/L (ref 22–32)
Calcium: 8.9 mg/dL (ref 8.9–10.3)
Chloride: 111 mmol/L (ref 98–111)
Creatinine, Ser: 0.65 mg/dL (ref 0.44–1.00)
GFR calc Af Amer: >60 mL/min (ref >60)
GFR calc non Af Amer: >60 mL/min (ref >60)
Glucose, Bld: 118 mg/dL — ABNORMAL HIGH (ref 70–99)
Potassium: 3.7 mmol/L (ref 3.5–5.1)
Sodium: 139 mmol/L (ref 135–145)

## 2019-12-19 MED ORDER — ZOLEDRONIC ACID 4 MG/100ML IV SOLN
4.0000 mg | Freq: Once | INTRAVENOUS | Status: AC
  Administered 2019-12-19: 4 mg via INTRAVENOUS
  Filled 2019-12-19: qty 100

## 2019-12-19 MED ORDER — SODIUM CHLORIDE 0.9 % IV SOLN
Freq: Once | INTRAVENOUS | Status: AC
  Filled 2019-12-19: qty 250

## 2019-12-19 MED ORDER — SODIUM CHLORIDE 0.9% FLUSH
10.0000 mL | INTRAVENOUS | Status: DC | PRN
  Administered 2019-12-19: 10 mL via INTRAVENOUS
  Filled 2019-12-19: qty 10

## 2019-12-19 MED ORDER — HEPARIN SOD (PORK) LOCK FLUSH 100 UNIT/ML IV SOLN
500.0000 [IU] | Freq: Once | INTRAVENOUS | Status: AC
  Administered 2019-12-19: 500 [IU] via INTRAVENOUS
  Filled 2019-12-19: qty 5

## 2019-12-19 MED ORDER — HEPARIN SOD (PORK) LOCK FLUSH 100 UNIT/ML IV SOLN
INTRAVENOUS | Status: AC
  Filled 2019-12-19: qty 5

## 2019-12-23 ENCOUNTER — Telehealth: Payer: Self-pay | Admitting: Student in an Organized Health Care Education/Training Program

## 2019-12-23 NOTE — Telephone Encounter (Signed)
Patient has been experiencing pain on right sided back , hip, leg. She has MRI scheduled on Sunday. Rachelle Hora PA has written script for gabapentin. 300mg  2 at bedtime. She would like to speak with someone about what is going on.

## 2019-12-23 NOTE — Telephone Encounter (Signed)
Called patient and she stated that anytime she was prescribed a new medication to let Dr Holley Raring know. Patient was picking weeds about 3 week and heard a pop in her left knee and couldn't stand up straight.  Rachelle Hora PA gave her Gabapentin and was informing him. She is going to have a MRI Sunday of her back.

## 2019-12-28 ENCOUNTER — Ambulatory Visit
Admission: RE | Admit: 2019-12-28 | Discharge: 2019-12-28 | Disposition: A | Discharge status: Home / Self Care | Payer: Medicare Other | Source: Ambulatory Visit | Attending: Orthopedic Surgery | Admitting: Orthopedic Surgery

## 2019-12-28 ENCOUNTER — Other Ambulatory Visit: Payer: Self-pay

## 2019-12-28 DIAGNOSIS — M5441 Lumbago with sciatica, right side: Secondary | ICD-10-CM | POA: Insufficient documentation

## 2020-01-06 ENCOUNTER — Other Ambulatory Visit: Payer: Self-pay

## 2020-01-06 ENCOUNTER — Encounter: Payer: Self-pay | Admitting: Student in an Organized Health Care Education/Training Program

## 2020-01-06 ENCOUNTER — Ambulatory Visit
Payer: Medicare Other | Attending: Student in an Organized Health Care Education/Training Program | Admitting: Student in an Organized Health Care Education/Training Program

## 2020-01-06 VITALS — BP 137/81 | HR 62 | Temp 96.7°F | Resp 16 | Ht <= 58 in | Wt 183.0 lb

## 2020-01-06 DIAGNOSIS — M5416 Radiculopathy, lumbar region: Secondary | ICD-10-CM

## 2020-01-06 DIAGNOSIS — M2550 Pain in unspecified joint: Secondary | ICD-10-CM | POA: Diagnosis not present

## 2020-01-06 DIAGNOSIS — M1611 Unilateral primary osteoarthritis, right hip: Secondary | ICD-10-CM | POA: Diagnosis present

## 2020-01-06 DIAGNOSIS — M797 Fibromyalgia: Secondary | ICD-10-CM | POA: Diagnosis not present

## 2020-01-06 DIAGNOSIS — G894 Chronic pain syndrome: Secondary | ICD-10-CM

## 2020-01-06 DIAGNOSIS — M1380 Other specified arthritis, unspecified site: Secondary | ICD-10-CM

## 2020-01-06 MED ORDER — HYDROCODONE-ACETAMINOPHEN 7.5-325 MG PO TABS: 1.0000 | ORAL_TABLET | Freq: Two times a day (BID) | ORAL | 0 refills | Status: DC | PRN

## 2020-01-06 NOTE — Progress Notes (Signed)
PROVIDER NOTE: Information contained herein reflects review and annotations entered in association with encounter. Interpretation of such information and data should be left to medically-trained personnel. Information provided to patient can be located elsewhere in the medical record under "Patient Instructions". Document created using STT-dictation technology, any transcriptional errors that may result from process are unintentional.    Patient: Victoria [name]  Service Category: E/M  Provider: Gillis Santa, MD  DOB: October 10, 1960  DOS: 01/06/2020  Referring Provider: Kirk Ruths, MD  MRN: [id number]  Setting: Ambulatory outpatient  PCP: Kirk Ruths, MD  Type: Established Patient  Specialty: Interventional Pain Management    Location: Office  Delivery: Face-to-face     Primary Reason(s) for Visit: Encounter for prescription drug management. (Level of risk: moderate)  CC: Back Pain (low back, left side and right side groin pain.)  HPI  Victoria [name] is a 59 y.o. year old, female patient, who comes today for a medication management evaluation. She has History of ductal carcinoma in situ (DCIS) of breast; Aromatase inhibitor use; Abnormal findings on auscultation; Encounter for long-term (current) use of high-risk medication; Fibromyalgia; Other forms of systemic lupus erythematosus ([place]); Other osteoporosis without current pathological fracture; Seronegative arthritis; Chronic daily headache; COPD (chronic obstructive pulmonary disease) ([place]); Multiple episodes of deep venous thrombosis ([place]); Polyarthralgia; Primary osteoarthritis of right hip; Headache disorder; IBS (irritable bowel syndrome); Morbid obesity with BMI of 45.0-49.9, adult ([place]); Presence of right hip implant; Shortness of breath; Thoracic aortic atherosclerosis ([place]); Healthcare maintenance; Lymphedema of both lower extremities; Thrombocytopenia ([place]); Leukopenia; Osteoporosis; STEC (Shiga toxin-producing Escherichia coli)  infection; Diverticulitis; Chronic fatigue; Vitamin D deficiency; and History of thrombosis on their problem list. Her primarily concern today is the Back Pain (low back, left side and right side groin pain.)  Pain Assessment: Location: Right Back Radiating: down to the right knee Onset: More than a month ago Duration: Chronic pain Quality: Constant, Burning, Aching Severity: 10-Worst pain ever/10 (subjective, self-reported pain score)  Note: Reported level is inconsistent with clinical observations.                         When using our objective Pain Scale, levels between 6 and 10/10 are said to belong in an emergency room, as it progressively worsens from a 6/10, described as severely limiting, requiring emergency care not usually available at an outpatient pain management facility. At a 6/10 level, communication becomes difficult and requires great effort. Assistance to reach the emergency department may be required. Facial flushing and profuse sweating along with potentially dangerous increases in heart rate and blood pressure will be evident. Effect on ADL: Slower to move around Timing: Constant Modifying factors: resting BP: 137/81  HR: 62  Victoria [name] was last scheduled for an appointment on 12/23/2019 for medication management. During today's appointment we reviewed Victoria [name] chronic pain status, as well as her outpatient medication regimen.  Worsening low back, right groin and right hip pain. This is also radiating down her right leg in a dermatomal fashion. She has severe right lateral hip pain. Patient was seen by orthopedics back in March for lateral hip pain and trochanteric bursa for which she received a trochanteric bursa injection with only 2 days of relief. She has right total hip arthroplasty in 2009 and 2010. She has increased pain with activity. Lumbar MRI was obtained on 12/28/2019 results of which are below. Patient states that her pain limits her mobility and that she is  not any physician  to reconsider physical or aquatic therapy. She continues her Pradaxa.  The patient  reports no history of drug use. Her body mass index is 42.53 kg/m.  Further details on both, my assessment(s), as well as the proposed treatment plan, please see below.  Controlled Substance Pharmacotherapy Assessment REMS (Risk Evaluation and Mitigation Strategy)  Analgesic: 12/22/2019  1   10/09/2019  Hydrocodone-Acetamin 7.5-325  45.00  23 Bi Lat   [id number]   Gib (4800)   0  14.67 MME  Medicare   [place]     Charna Busman, NT  01/06/2020  9:52 AM  Sign when Signing Visit Safety precautions to be maintained throughout the outpatient stay will include: orient to surroundings, keep bed in low position, maintain call bell within reach at all times, provide assistance with transfer out of bed and ambulation.    Pharmacokinetics: Liberation and absorption (onset of action): WNL Distribution (time to peak effect): WNL Metabolism and excretion (duration of action): WNL         Pharmacodynamics: Desired effects: Analgesia: Victoria [name] reports 50% benefit. Functional ability: Patient reports that medication allows her to accomplish basic ADLs Clinically meaningful improvement in function (CMIF): Sustained CMIF goals met Perceived effectiveness: Described as relatively effective but with some room for improvement Undesirable effects: Side-effects or Adverse reactions: None reported Monitoring: [place] PMP: PDMP not reviewed this encounter. Online review of the past 31-monthperiod conducted. Compliant with practice rules and regulations Last UDS on record: Summary  Date Value Ref Range Status  10/06/2019 Note  Final    Comment:    ==================================================================== [employer] Select 13 (MW) ==================================================================== Test                             Result       Flag       Units Drug Present not Declared for Prescription  Verification   Hydrocodone                    67           UNEXPECTED ng/mg creat   Hydromorphone                  50           UNEXPECTED ng/mg creat   Norhydrocodone                 99           UNEXPECTED ng/mg creat    Sources of hydrocodone include scheduled prescription medications.    Hydromorphone and norhydrocodone are expected metabolites of    hydrocodone. Hydromorphone is also available as a scheduled    prescription medication. ==================================================================== Test                      Result    Flag   Units      Ref Range   Creatinine              132              mg/dL      >=20 ==================================================================== Declared Medications:  The flagging and interpretation on this report are based on the  following declared medications.  Unexpected results may arise from  inaccuracies in the declared medications.  **Note: The testing scope of this panel does not include the  following reported medications:  Albuterol  Albuterol (Combivent)  Atorvastatin  Benzonatate  Budesonide (Symbicort)  Cyclobenzaprine  Dabigatran (Pradaxa)  Folic Acid  Formoterol (Symbicort)  Gabapentin  Hydroxychloroquine  Ibuprofen  Ipratropium (Combivent)  Leflunomide (Arava)  Letrozole (Femara)  Methotrexate  Omeprazole  Ondansetron  Potassium (Klor-Con)  Prednisone  Tiotropium  Topiramate  Trazodone ==================================================================== For clinical consultation, please call [phone]. ====================================================================    UDS interpretation: Compliant          Medication Assessment Form: Reviewed. Patient indicates being compliant with therapy Treatment compliance: Compliant Risk Assessment Profile: Aberrant behavior: See initial evaluations. None observed or detected today Comorbid factors increasing risk of overdose: See initial evaluation.  No additional risks detected today Opioid risk tool (ORT):  Opioid Risk  01/06/2020  Alcohol 0  Illegal Drugs 0  Rx Drugs 0  Alcohol 0  Illegal Drugs 0  Rx Drugs 0  Age between 16-45 years  0  History of Preadolescent Sexual Abuse 0  Psychological Disease 0  Depression 0  Opioid Risk Tool Scoring 0  Opioid Risk Interpretation Low Risk    ORT Scoring interpretation table:  Score <3 = Low Risk for SUD  Score between 4-7 = Moderate Risk for SUD  Score >8 = High Risk for Opioid Abuse   Risk of substance use disorder (SUD): Low  Risk Mitigation Strategies:  Patient Counseling: Covered Patient-Prescriber Agreement (PPA): Present and active  Notification to other healthcare providers: Done  Pharmacologic Plan: Increase from quantity 45/month to quantity 60/month so that patient can utilize twice daily as needed given increased right-sided hip pain.             Laboratory Chemistry Profile   Renal Lab Results  Component Value Date   BUN 14 12/19/2019   CREATININE 0.65 12/19/2019   GFRAA >60 12/19/2019   GFRNONAA >60 12/19/2019   PROTEINUR NEGATIVE 05/05/2018     Electrolytes Lab Results  Component Value Date   NA 139 12/19/2019   K 3.7 12/19/2019   CL 111 12/19/2019   CALCIUM 8.9 12/19/2019     Hepatic Lab Results  Component Value Date   AST 33 11/18/2019   ALT 36 11/18/2019   ALBUMIN 3.9 11/18/2019   ALKPHOS 46 11/18/2019     ID Lab Results  Component Value Date   SARSCOV2NAA NEGATIVE 07/04/2019     Bone No results found for: VD25OH, CX448JE5UDJ, SH7026VZ8, HY8502DX4, 25OHVITD1, 25OHVITD2, 25OHVITD3, TESTOFREE, TESTOSTERONE   Endocrine Lab Results  Component Value Date   GLUCOSE 118 (H) 12/19/2019   GLUCOSEU NEGATIVE 05/05/2018     Neuropathy Lab Results  Component Value Date   VITAMINB12 397 02/11/2019   FOLATE 9.2 02/11/2019     CNS No results found for: COLORCSF, APPEARCSF, RBCCOUNTCSF, WBCCSF, POLYSCSF, LYMPHSCSF, EOSCSF, PROTEINCSF,  GLUCCSF, JCVIRUS, CSFOLI, IGGCSF, LABACHR, ACETBL, LABACHR, ACETBL   Inflammation (CRP: Acute  ESR: Chronic) Lab Results  Component Value Date   CRP <0.8 09/12/2018   ESRSEDRATE 16 09/12/2018     Rheumatology No results found for: RF, ANA, LABURIC, URICUR, LYMEIGGIGMAB, LYMEABIGMQN, HLAB27   Coagulation Lab Results  Component Value Date   INR 1.04 06/04/2018   LABPROT 13.5 06/04/2018   PLT 155 11/18/2019     Cardiovascular Lab Results  Component Value Date   TROPONINI <0.03 06/04/2018   HGB 11.8 (L) 11/18/2019   HCT 35.9 (L) 11/18/2019     Screening Lab Results  Component Value Date   SARSCOV2NAA NEGATIVE 07/04/2019   COVIDSOURCE NASOPHARYNGEAL 01/31/2019     Cancer No results found for: CEA, CA125, LABCA2  Allergens No results found for: ALMOND, APPLE, ASPARAGUS, AVOCADO, BANANA, BARLEY, BASIL, BAYLEAF, GREENBEAN, LIMABEAN, WHITEBEAN, BEEFIGE, REDBEET, BLUEBERRY, BROCCOLI, CABBAGE, MELON, CARROT, CASEIN, CASHEWNUT, CAULIFLOWER, CELERY     Note: Lab results reviewed.   Recent Diagnostic Imaging Results  MR LUMBAR SPINE WO CONTRAST CLINICAL DATA:  Initial evaluation for acute right-sided lower back pain with right-sided sciatica.  EXAM: MRI LUMBAR SPINE WITHOUT CONTRAST  TECHNIQUE: Multiplanar, multisequence MR imaging of the lumbar spine was performed. No intravenous contrast was administered.  COMPARISON:  None available.  FINDINGS: Segmentation: Standard. Lowest well-formed disc space labeled the L5-S1 level.  Alignment: Trace anterolisthesis of L5 on S1, chronic and facet mediated. Alignment otherwise normal with preservation of the normal lumbar lordosis.  Vertebrae: Vertebral body height maintained without evidence for acute or chronic fracture. Bone marrow signal intensity mildly heterogeneous but within normal limits. No worrisome osseous lesions. No abnormal marrow edema.  Conus medullaris and cauda equina: Conus extends to the L1-2  level. Conus and cauda equina appear normal.  Paraspinal and other soft tissues: Paraspinous soft tissues within normal limits. Subcentimeter simple exophytic cyst noted extending from the left kidney. Visualized visceral structures otherwise unremarkable.  Disc levels:  L1-2: Disc desiccation with minimal annular disc bulge. No canal or foraminal stenosis.  L2-3: Mild diffuse disc bulge with disc desiccation. Minimal reactive endplate changes. Superimposed shallow left extraforaminal disc protrusion closely approximates the exiting left L2 nerve root (series 8, image 14). No significant spinal stenosis. Foramina remain patent.  L3-4: Disc desiccation with mild annular disc bulge. No significant spinal stenosis. Foramina remain patent.  L4-5: Disc desiccation with mild annular disc bulge. Mild bilateral facet hypertrophy. Associated small joint effusion on the right. No spinal stenosis. Foramina remain patent.  L5-S1: Trace anterolisthesis. Disc desiccation without significant disc bulge. Moderate bilateral facet hypertrophy. No canal or lateral recess stenosis. Foramina remain patent.  IMPRESSION: 1. Shallow left extraforaminal disc protrusion at L2-3, closely approximating and potentially irritating the exiting left L2 nerve root. 2. Trace anterolisthesis of L5 on S1 with associated moderate bilateral facet hypertrophy. 3. Additional mild noncompressive disc bulging at L1-2 thru L3-4 without significant stenosis or neural impingement.  Electronically Signed   By: Jeannine Boga M.D.   On: 12/28/2019 22:59  Complexity Note: Imaging results reviewed. Results shared with Victoria [name], using Layman's terms.                               Meds   Current Outpatient Medications:  .  albuterol (PROVENTIL HFA;VENTOLIN HFA) 108 (90 Base) MCG/ACT inhaler, Inhale 2 puffs into the lungs every 6 (six) hours as needed for wheezing or shortness of breath., Disp: 1 Inhaler, Rfl:  2 .  atorvastatin (LIPITOR) 40 MG tablet, Take 40 mg by mouth daily., Disp: , Rfl:  .  benzonatate (TESSALON) 100 MG capsule, Take 1 capsule (100 mg total) by mouth 3 (three) times daily as needed., Disp: 30 capsule, Rfl: 0 .  budesonide-formoterol (SYMBICORT) 160-4.5 MCG/ACT inhaler, Inhale 2 puffs into the lungs 2 (two) times daily., Disp: 1 Inhaler, Rfl: 12 .  cyclobenzaprine (FLEXERIL) 10 MG tablet, TAKE 1 TABLET BY MOUTH TWICE A DAY AS NEEDED FOR MUSCLE SPASMS, Disp: 40 tablet, Rfl: 0 .  dabigatran (PRADAXA) 150 MG CAPS capsule, Take 1 capsule (150 mg total) by mouth 2 (two) times daily., Disp: 180 capsule, Rfl: 3 .  folic acid (FOLVITE) 1 MG tablet, Take by mouth., Disp: ,  Rfl:  .  gabapentin (NEURONTIN) 100 MG capsule, 200 mg 3 (three) times daily. , Disp: , Rfl:  .  [START ON 01/15/2020] HYDROcodone-acetaminophen (NORCO) 7.5-325 MG tablet, Take 1 tablet by mouth 2 (two) times daily as needed for moderate pain., Disp: 60 tablet, Rfl: 0 .  [START ON 02/14/2020] HYDROcodone-acetaminophen (NORCO) 7.5-325 MG tablet, Take 1 tablet by mouth 2 (two) times daily as needed for moderate pain., Disp: 60 tablet, Rfl: 0 .  [START ON 03/15/2020] HYDROcodone-acetaminophen (NORCO) 7.5-325 MG tablet, Take 1 tablet by mouth 2 (two) times daily as needed for moderate pain., Disp: 60 tablet, Rfl: 0 .  hydroxychloroquine (PLAQUENIL) 200 MG tablet, Take 200 mg by mouth daily., Disp: , Rfl:  .  Ipratropium-Albuterol (COMBIVENT) 20-100 MCG/ACT AERS respimat, Inhale into the lungs., Disp: , Rfl:  .  letrozole (FEMARA) 2.5 MG tablet, TAKE 1 TABLET BY MOUTH ONCE DAILY, Disp: 30 tablet, Rfl: 3 .  methotrexate 2.5 MG tablet, Take 2.5 mg by mouth once a week. , Disp: , Rfl:  .  omeprazole (PRILOSEC) 40 MG capsule, Take 1 capsule (40 mg total) by mouth 2 (two) times daily., Disp: 90 capsule, Rfl: 1 .  ondansetron (ZOFRAN-ODT) 8 MG disintegrating tablet, Take by mouth., Disp: , Rfl:  .  pantoprazole (PROTONIX) 40 MG tablet,  Take by mouth., Disp: , Rfl:  .  potassium chloride (K-DUR) 10 MEQ tablet, Take 10 mEq by mouth daily., Disp: , Rfl:  .  Tiotropium Bromide Monohydrate (SPIRIVA RESPIMAT) 2.5 MCG/ACT AERS, Inhale 2 puffs into the lungs every morning., Disp: 1 Inhaler, Rfl: 11 .  topiramate (TOPAMAX) 50 MG tablet, Take 50 mg by mouth 2 (two) times daily., Disp: , Rfl:  .  torsemide (DEMADEX) 10 MG tablet, , Disp: , Rfl:  .  traZODone (DESYREL) 50 MG tablet, Take 3 tablets (150 mg total) by mouth at bedtime., Disp: 180 tablet, Rfl: 1 .  amoxicillin (AMOXIL) 500 MG capsule, Take 500 mg by mouth 3 (three) times daily., Disp: , Rfl:  .  gabapentin (NEURONTIN) 300 MG capsule, Take 600 mg by mouth 3 (three) times daily., Disp: , Rfl:  .  ibuprofen (ADVIL) 600 MG tablet, , Disp: , Rfl:  .  predniSONE (DELTASONE) 20 MG tablet, Take by mouth., Disp: , Rfl:  .  Topiramate (TOPAMAX PO), Take 50 mg by mouth 2 (two) times daily. , Disp: , Rfl:  No current facility-administered medications for this visit.  Facility-Administered Medications Ordered in Other Visits:  .  sodium chloride flush (NS) 0.9 % injection 10 mL, 10 mL, Intravenous, PRN, Earlie Server, MD, 10 mL at 01/22/19 1503  ROS  Constitutional: Denies any fever or chills Gastrointestinal: No reported hemesis, hematochezia, vomiting, or acute GI distress Musculoskeletal: Denies any acute onset joint swelling, redness, loss of ROM, or weakness Neurological: No reported episodes of acute onset apraxia, aphasia, dysarthria, agnosia, amnesia, paralysis, loss of coordination, or loss of consciousness  Allergies  Victoria [name] is allergic to other; sodium hypochlorite; tape; codeine; flagyl [metronidazole]; levofloxacin; and sulfa antibiotics.  Victoria [name]  Drug: Victoria [name]  reports no history of drug use. Alcohol:  reports current alcohol use. Tobacco:  reports that she quit smoking about 20 years ago. Her smoking use included cigarettes. She has a 30.00 pack-year smoking  history. She has never used smokeless tobacco. Medical:  has a past medical history of Asthma, Cancer ([place]), Collagen vascular disease ([place]), COPD (chronic obstructive pulmonary disease) ([place]), Diverticulitis, Fibromyalgia, GERD (gastroesophageal reflux disease), Hearing impaired person,  left, IBS (irritable bowel syndrome), Lupus ([place]), Mitral valve disorder, MS (multiple sclerosis) ([place]), Neuromuscular disorder ([place]), Osteoarthritis, Osteoporosis (05/12/2019), Personal history of radiation therapy (2017), Rheumatoid arthritis ([place]), and Seizures ([place]). Surgical: Victoria [name]  has a past surgical history that includes Breast surgery; Abdominal hysterectomy; Appendectomy; Cholecystectomy; Patent ductus arterious repair; Total hip arthroplasty (Right); Reconstruction tendon pulley hand (Left); Esophagogastroduodenoscopy (egd) with propofol (N/A, 07/07/2019); Colonoscopy with propofol (N/A, 07/07/2019); Breast lumpectomy (Left, 2017); Breast excisional biopsy (Left, 2017); and Breast excisional biopsy (Left, 2004). Family: family history includes Arthritis in her brother; COPD in her mother; Diabetes in her brother; Stroke in her brother and father.  Constitutional Exam  General appearance: Well nourished, well developed, and well hydrated. In no apparent acute distress Vitals:   01/06/20 0944  BP: 137/81  Pulse: 62  Resp: 16  Temp: (!) 96.7 F (35.9 C)  TempSrc: Oral  SpO2: 100%  Weight: 183 lb (83 kg)  Height: 4' 7"  (1.397 m)   BMI Assessment: Estimated body mass index is 42.53 kg/m as calculated from the following:   Height as of this encounter: 4' 7"  (1.397 m).   Weight as of this encounter: 183 lb (83 kg).  BMI interpretation table: BMI level Category Range association with higher incidence of chronic pain  <18 kg/m2 Underweight   18.5-24.9 kg/m2 Ideal body weight   25-29.9 kg/m2 Overweight Increased incidence by 20%  30-34.9 kg/m2 Obese (Class I) Increased incidence by 68%  35-39.9  kg/m2 Severe obesity (Class II) Increased incidence by 136%  >40 kg/m2 Extreme obesity (Class III) Increased incidence by 254%   Patient's current BMI Ideal Body weight  Body mass index is 42.53 kg/m. Patient must be at least 60 in tall to calculate ideal body weight   BMI Readings from Last 4 Encounters:  01/06/20 42.53 kg/m  11/18/19 43.93 kg/m  08/22/19 42.79 kg/m  08/08/19 43.93 kg/m   Wt Readings from Last 4 Encounters:  01/06/20 183 lb (83 kg)  11/18/19 189 lb (85.7 kg)  08/22/19 184 lb 1.6 oz (83.5 kg)  08/08/19 189 lb (85.7 kg)    Psych/Mental status: Alert, oriented x 3 (person, place, & time)       Eyes: PERLA Respiratory: No evidence of acute respiratory distress  Cervical Spine Exam  Skin & Axial Inspection: No masses, redness, edema, swelling, or associated skin lesions Alignment: Symmetrical Functional ROM: Unrestricted ROM      Stability: No instability detected Muscle Tone/Strength: Functionally intact. No obvious neuro-muscular anomalies detected. Sensory (Neurological): Unimpaired Palpation: No palpable anomalies              Upper Extremity (UE) Exam    Side: Right upper extremity  Side: Left upper extremity  Skin & Extremity Inspection: Edema  Skin & Extremity Inspection: Skin color, temperature, and hair growth are WNL. No peripheral edema or cyanosis. No masses, redness, swelling, asymmetry, or associated skin lesions. No contractures.  Functional ROM:           Functional ROM: Unrestricted ROM          Muscle Tone/Strength: Functionally intact. No obvious neuro-muscular anomalies detected.  Muscle Tone/Strength: Functionally intact. No obvious neuro-muscular anomalies detected.  Sensory (Neurological): Unimpaired          Sensory (Neurological): Unimpaired          Palpation: No palpable anomalies              Palpation: No palpable anomalies  Provocative Test(s):  Phalen's test: deferred Tinel's test: deferred Apley's scratch test  (touch opposite shoulder):  Action 1 (Across chest): deferred Action 2 (Overhead): deferred Action 3 (LB reach): deferred   Provocative Test(s):  Phalen's test: deferred Tinel's test: deferred Apley's scratch test (touch opposite shoulder):  Action 1 (Across chest): deferred Action 2 (Overhead): deferred Action 3 (LB reach): deferred    Thoracic Spine Area Exam  Skin & Axial Inspection: No masses, redness, or swelling Alignment: Symmetrical Functional ROM: Decreased ROM Stability: No instability detected Muscle Tone/Strength: Functionally intact. No obvious neuro-muscular anomalies detected. Sensory (Neurological): Musculoskeletal pain pattern Muscle strength & Tone: No palpable anomalies  Lumbar Exam  Skin & Axial Inspection: No masses, redness, or swelling Alignment: Symmetrical Functional ROM: Pain restricted ROM affecting both sides Stability: No instability detected Muscle Tone/Strength: Functionally intact. No obvious neuro-muscular anomalies detected. Sensory (Neurological): Musculoskeletal pain pattern and dermatomal pain pattern  Gait & Posture Assessment  Ambulation: Patient ambulates using a cane Gait: Limited. Using assistive device to ambulate Posture: Difficulty standing up straight, due to pain   Lower Extremity Exam    Side: Right lower extremity  Side: Left lower extremity  Stability: No instability observed          Stability: No instability observed          Skin & Extremity Inspection: Evidence of prior arthroplastic surgery  Skin & Extremity Inspection: Skin color, temperature, and hair growth are WNL. No peripheral edema or cyanosis. No masses, redness, swelling, asymmetry, or associated skin lesions. No contractures.  Functional ROM: Pain restricted ROM for all joints of the lower extremity          Functional ROM: Pain restricted ROM                  Muscle Tone/Strength: Functionally intact. No obvious neuro-muscular anomalies detected.  Muscle  Tone/Strength: Functionally intact. No obvious neuro-muscular anomalies detected.  Sensory (Neurological): Dermatomal pain pattern        Sensory (Neurological): Unimpaired        DTR: Patellar: 0: absent Achilles: deferred today Plantar: deferred today  DTR: Patellar: 0: absent Achilles: deferred today Plantar: deferred today  Palpation: No palpable anomalies  Palpation: No palpable anomalies   Assessment   Status Diagnosis  Controlled Controlled Controlled 1. Fibromyalgia   2. Chronic pain syndrome   3. Lumbar radiculopathy   4. Polyarthralgia   5. Seronegative arthritis   6. Primary osteoarthritis of right hip      1.  Discussed the importance of weight loss and dieting. Also discussed physical therapy and aquatic therapy. 2. Increase hydrocodone from quantity 45/month to quantity 60/month so that patient can utilize twice daily as needed 3. Continue rheumatologic management 4. Continue gabapentin as prescribed  Plan of Care  Pharmacotherapy (Medications Ordered): Meds ordered this encounter  Medications  . HYDROcodone-acetaminophen (NORCO) 7.5-325 MG tablet    Sig: Take 1 tablet by mouth 2 (two) times daily as needed for moderate pain.    Dispense:  60 tablet    Refill:  0    For chronic pain syndrome. To last 30 days from previous fill date  . HYDROcodone-acetaminophen (NORCO) 7.5-325 MG tablet    Sig: Take 1 tablet by mouth 2 (two) times daily as needed for moderate pain.    Dispense:  60 tablet    Refill:  0    For chronic pain syndrome. To last 30 days from previous fill date  . HYDROcodone-acetaminophen (NORCO) 7.5-325 MG tablet  Sig: Take 1 tablet by mouth 2 (two) times daily as needed for moderate pain.    Dispense:  60 tablet    Refill:  0    For chronic pain syndrome. To last 30 days from previous fill date   Planned follow-up:   Return in about 14 weeks (around 04/13/2020) for Medication Management, in person.   Recent Visits Date Type Provider Dept   10/09/19 Office Visit Gillis Santa, MD [facility]  Showing recent visits within past 90 days and meeting all other requirements   Today's Visits Date Type Provider Dept  01/06/20 Office Visit Gillis Santa, MD [facility]  Showing today's visits and meeting all other requirements   Future Appointments No visits were found meeting these conditions.  Showing future appointments within next 90 days and meeting all other requirements   Primary Care Physician: Kirk Ruths, MD Location: [facility] Note by: Gillis Santa, MD Date: 01/06/2020; Time: 10:25 AM  Note: This dictation was prepared with Dragon dictation. Any transcriptional errors that may result from this process are unintentional.

## 2020-01-06 NOTE — Progress Notes (Signed)
Safety precautions to be maintained throughout the outpatient stay will include: orient to surroundings, keep bed in low position, maintain call bell within reach at all times, provide assistance with transfer out of bed and ambulation.  

## 2020-02-13 ENCOUNTER — Encounter (HOSPITAL_COMMUNITY): Payer: Self-pay | Admitting: Emergency Medicine

## 2020-02-13 ENCOUNTER — Other Ambulatory Visit: Payer: Self-pay

## 2020-02-13 ENCOUNTER — Emergency Department (HOSPITAL_COMMUNITY): Payer: Medicare Other

## 2020-02-13 ENCOUNTER — Emergency Department (HOSPITAL_COMMUNITY)
Admission: EM | Admit: 2020-02-13 | Discharge: 2020-02-14 | Disposition: A | Discharge status: Home / Self Care | Payer: Medicare Other | Attending: Emergency Medicine | Admitting: Emergency Medicine

## 2020-02-13 DIAGNOSIS — Y92410 Unspecified street and highway as the place of occurrence of the external cause: Secondary | ICD-10-CM | POA: Diagnosis not present

## 2020-02-13 DIAGNOSIS — Z87891 Personal history of nicotine dependence: Secondary | ICD-10-CM | POA: Diagnosis not present

## 2020-02-13 DIAGNOSIS — M545 Low back pain, unspecified: Secondary | ICD-10-CM

## 2020-02-13 DIAGNOSIS — Y93I9 Activity, other involving external motion: Secondary | ICD-10-CM | POA: Diagnosis not present

## 2020-02-13 DIAGNOSIS — Y999 Unspecified external cause status: Secondary | ICD-10-CM | POA: Insufficient documentation

## 2020-02-13 DIAGNOSIS — Z79899 Other long term (current) drug therapy: Secondary | ICD-10-CM | POA: Insufficient documentation

## 2020-02-13 DIAGNOSIS — J449 Chronic obstructive pulmonary disease, unspecified: Secondary | ICD-10-CM | POA: Insufficient documentation

## 2020-02-13 DIAGNOSIS — R52 Pain, unspecified: Secondary | ICD-10-CM

## 2020-02-13 MED ORDER — OXYCODONE-ACETAMINOPHEN 5-325 MG PO TABS
2.0000 | ORAL_TABLET | Freq: Once | ORAL | Status: AC
  Administered 2020-02-13: 2 via ORAL
  Filled 2020-02-13: qty 2
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
## 2020-02-13 NOTE — Discharge Instructions (Addendum)
Please take ibuprofen and tylenol as needed for pain Return if you are having worsening symptoms
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
## 2020-02-16 ENCOUNTER — Other Ambulatory Visit: Payer: Self-pay

## 2020-02-16 DIAGNOSIS — Z86000 Personal history of in-situ neoplasm of breast: Secondary | ICD-10-CM

## 2020-02-17 ENCOUNTER — Inpatient Hospital Stay: Payer: Medicare Other | Attending: Oncology

## 2020-02-17 ENCOUNTER — Other Ambulatory Visit: Payer: Self-pay

## 2020-02-17 ENCOUNTER — Encounter: Payer: Self-pay | Admitting: Oncology

## 2020-02-17 ENCOUNTER — Inpatient Hospital Stay (HOSPITAL_BASED_OUTPATIENT_CLINIC_OR_DEPARTMENT_OTHER): Payer: Medicare Other | Admitting: Oncology

## 2020-02-17 VITALS — BP 142/84 | HR 68 | Temp 96.7°F | Resp 16 | Wt 186.2 lb

## 2020-02-17 DIAGNOSIS — Z79811 Long term (current) use of aromatase inhibitors: Secondary | ICD-10-CM

## 2020-02-17 DIAGNOSIS — M818 Other osteoporosis without current pathological fracture: Secondary | ICD-10-CM | POA: Diagnosis present

## 2020-02-17 DIAGNOSIS — D0512 Intraductal carcinoma in situ of left breast: Secondary | ICD-10-CM | POA: Diagnosis not present

## 2020-02-17 DIAGNOSIS — Z86718 Personal history of other venous thrombosis and embolism: Secondary | ICD-10-CM | POA: Diagnosis not present

## 2020-02-17 DIAGNOSIS — Z86000 Personal history of in-situ neoplasm of breast: Secondary | ICD-10-CM | POA: Diagnosis not present

## 2020-02-17 DIAGNOSIS — Z95828 Presence of other vascular implants and grafts: Secondary | ICD-10-CM

## 2020-02-17 DIAGNOSIS — M810 Age-related osteoporosis without current pathological fracture: Secondary | ICD-10-CM

## 2020-02-17 LAB — COMPREHENSIVE METABOLIC PANEL
ALT: 23 U/L (ref 0–44)
AST: 20 U/L (ref 15–41)
Albumin: 3.9 g/dL (ref 3.5–5.0)
Alkaline Phosphatase: 52 U/L (ref 38–126)
Anion gap: 7 (ref 5–15)
BUN: 20 mg/dL (ref 6–20)
CO2: 23 mmol/L (ref 22–32)
Calcium: 9.1 mg/dL (ref 8.9–10.3)
Chloride: 110 mmol/L (ref 98–111)
Creatinine, Ser: 0.79 mg/dL (ref 0.44–1.00)
GFR calc Af Amer: >60 mL/min (ref >60)
GFR calc non Af Amer: >60 mL/min (ref >60)
Glucose, Bld: 105 mg/dL — ABNORMAL HIGH (ref 70–99)
Potassium: 3.7 mmol/L (ref 3.5–5.1)
Sodium: 140 mmol/L (ref 135–145)
Total Bilirubin: 0.3 mg/dL (ref 0.3–1.2)
Total Protein: 6.3 g/dL — ABNORMAL LOW (ref 6.5–8.1)

## 2020-02-17 LAB — CBC WITH DIFFERENTIAL/PLATELET
Abs Immature Granulocytes: 0.01 10*3/uL (ref 0.00–0.07)
Basophils Absolute: 0 10*3/uL (ref 0.0–0.1)
Basophils Relative: 1 %
Eosinophils Absolute: 0 10*3/uL (ref 0.0–0.5)
Eosinophils Relative: 0 %
HCT: 34.1 % — ABNORMAL LOW (ref 36.0–46.0)
Hemoglobin: 11.7 g/dL — ABNORMAL LOW (ref 12.0–15.0)
Immature Granulocytes: 0 %
Lymphocytes Relative: 21 %
Lymphs Abs: 0.8 10*3/uL (ref 0.7–4.0)
MCH: 31.1 pg (ref 26.0–34.0)
MCHC: 34.3 g/dL (ref 30.0–36.0)
MCV: 90.7 fL (ref 80.0–100.0)
Monocytes Absolute: 0.3 10*3/uL (ref 0.1–1.0)
Monocytes Relative: 7 %
Neutro Abs: 2.7 10*3/uL (ref 1.7–7.7)
Neutrophils Relative %: 71 %
Platelets: 125 10*3/uL — ABNORMAL LOW (ref 150–400)
RBC: 3.76 MIL/uL — ABNORMAL LOW (ref 3.87–5.11)
RDW: 15 % (ref 11.5–15.5)
WBC: 3.8 10*3/uL — ABNORMAL LOW (ref 4.0–10.5)
nRBC: 0 % (ref 0.0–0.2)

## 2020-02-17 MED ORDER — HEPARIN SOD (PORK) LOCK FLUSH 100 UNIT/ML IV SOLN
500.0000 [IU] | Freq: Once | INTRAVENOUS | Status: AC
  Administered 2020-02-17: 500 [IU] via INTRAVENOUS
  Filled 2020-02-17: qty 5

## 2020-02-17 MED ORDER — SODIUM CHLORIDE 0.9% FLUSH
10.0000 mL | INTRAVENOUS | Status: DC | PRN
  Administered 2020-02-17: 10 mL via INTRAVENOUS
  Filled 2020-02-17: qty 10

## 2020-02-17 NOTE — Progress Notes (Signed)
Patient denies new problems/concerns today.   °
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
## 2020-02-19 ENCOUNTER — Other Ambulatory Visit: Payer: Self-pay

## 2020-02-19 ENCOUNTER — Inpatient Hospital Stay: Payer: Medicare Other

## 2020-02-19 VITALS — BP 109/78 | HR 75 | Temp 97.2°F | Resp 16

## 2020-02-19 DIAGNOSIS — M818 Other osteoporosis without current pathological fracture: Secondary | ICD-10-CM | POA: Diagnosis not present

## 2020-02-19 DIAGNOSIS — M810 Age-related osteoporosis without current pathological fracture: Secondary | ICD-10-CM

## 2020-02-19 MED ORDER — HEPARIN SOD (PORK) LOCK FLUSH 100 UNIT/ML IV SOLN
500.0000 [IU] | Freq: Once | INTRAVENOUS | Status: AC
  Administered 2020-02-19: 500 [IU] via INTRAVENOUS
  Filled 2020-02-19: qty 5

## 2020-02-19 MED ORDER — SODIUM CHLORIDE 0.9 % IV SOLN
Freq: Once | INTRAVENOUS | Status: AC
  Filled 2020-02-19: qty 250

## 2020-02-19 MED ORDER — ZOLEDRONIC ACID 4 MG/100ML IV SOLN
4.0000 mg | Freq: Once | INTRAVENOUS | Status: AC
  Administered 2020-02-19: 4 mg via INTRAVENOUS
  Filled 2020-02-19: qty 100

## 2020-02-19 MED ORDER — HEPARIN SOD (PORK) LOCK FLUSH 100 UNIT/ML IV SOLN
INTRAVENOUS | Status: AC
  Filled 2020-02-19: qty 5

## 2020-04-07 ENCOUNTER — Other Ambulatory Visit: Payer: Self-pay

## 2020-04-07 ENCOUNTER — Ambulatory Visit: Payer: Medicare Other | Attending: Otolaryngology | Admitting: Speech Pathology

## 2020-04-07 DIAGNOSIS — R490 Dysphonia: Secondary | ICD-10-CM | POA: Diagnosis not present

## 2020-04-08 ENCOUNTER — Other Ambulatory Visit: Payer: Self-pay

## 2020-04-08 ENCOUNTER — Encounter: Payer: Self-pay | Admitting: Speech Pathology

## 2020-04-08 NOTE — Therapy (Signed)
[place] MAIN [facility] SERVICES [address] [place], [place], [address] Phone: [phone]   Fax:  [phone]  Speech Language Pathology Evaluation  Patient Details  Name: Gracen Ringwald MRN: [id number] Date of Birth: April 11, 1961 Referring Provider (SLP): Dr. Pryor Ochoa   Encounter Date: 04/07/2020   End of Session - 04/08/20 1504    Visit Number 1    Number of Visits 17    Date for SLP Re-Evaluation 06/03/20    Authorization Type Medicare    Authorization Time Period Start 04/07/2020    Authorization - Visit Number 1    Progress Note Due on Visit 10    SLP Start Time 1400    SLP Stop Time  1455    SLP Time Calculation (min) 55 min    Activity Tolerance Patient tolerated treatment well           Past Medical History:  Diagnosis Date  . Asthma   . Cancer ([place])    Breast Left, DCIS 2018  . Collagen vascular disease ([facility])    Lupus  . COPD (chronic obstructive pulmonary disease) ([place])   . Diverticulitis   . Fibromyalgia   . GERD (gastroesophageal reflux disease)   . Hearing impaired person, left   . IBS (irritable bowel syndrome)   . Lupus ([place])   . Mitral valve disorder   . MS (multiple sclerosis) ([place])   . Neuromuscular disorder ([place])   . Osteoarthritis   . Osteoporosis 05/12/2019  . Personal history of radiation therapy 2017   left   . Rheumatoid arthritis ([place])   . Seizures ([place])     Past Surgical History:  Procedure Laterality Date  . ABDOMINAL HYSTERECTOMY    . APPENDECTOMY    . BREAST EXCISIONAL BIOPSY Left 2017   lumpectomy with radation  . BREAST EXCISIONAL BIOPSY Left 2004   neg surgical bx   . BREAST LUMPECTOMY Left 2017   DCIS  . BREAST SURGERY    . CHOLECYSTECTOMY    . COLONOSCOPY WITH PROPOFOL N/A 07/07/2019   Procedure: COLONOSCOPY WITH PROPOFOL;  Surgeon: Toledo, Benay Pike, MD;  Location: [facility] ENDOSCOPY;  Service: Gastroenterology;  Laterality: N/A;  . ESOPHAGOGASTRODUODENOSCOPY (EGD) WITH PROPOFOL N/A  07/07/2019   Procedure: ESOPHAGOGASTRODUODENOSCOPY (EGD) WITH PROPOFOL;  Surgeon: Toledo, Benay Pike, MD;  Location: [facility] ENDOSCOPY;  Service: Gastroenterology;  Laterality: N/A;  . PATENT DUCTUS ARTERIOUS REPAIR    . RECONSTRUCTION TENDON PULLEY HAND Left   . TOTAL HIP ARTHROPLASTY Right     There were no vitals filed for this visit.       SLP Evaluation [facility] - 04/08/20 0001      SLP Visit Information   SLP Received On 04/07/20    Referring Provider (SLP) Dr. Pryor Ochoa    Onset Date 04/05/2020    Medical Diagnosis dysphonia      Subjective   Subjective Patient is agreeable to voice therapy    Patient/Family Stated Goal Improved vocal quality      Pain Assessment   Currently in Pain? 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      Cognition   Overall Cognitive Status Within Functional Limits for tasks assessed      Oral Motor/Sensory Function   Overall Oral Motor/Sensory Function Appears within functional limits for tasks assessed      Motor Speech  Overall Motor Speech Impaired    Respiration Impaired    Level of Impairment Conversation    Phonation Hoarse    Resonance Within functional limits    Articulation Within functional limitis    Intelligibility Intelligible    Phonation Impaired    Vocal Abuses Habitual Hyperphonia;Habitual Cough/Throat Clear;Vocal Fold Dehydration    Tension Present Shoulder;Neck;Jaw    Volume Appropriate    Pitch Low      Standardized Assessments   Standardized Assessments  Other Assessment   Perceptual Voice Evaluation          Perceptual Voice Evaluation Voice checklist:  Health risks: GERD, allergies, excess caffeine intake, reduced water intake  Characteristic voice use: 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  Maximum phonation time for sustained "ah": 11 seconds  Average fundamental frequency during sustained "ah": 240 Hz (1 average for gender) Habitual pitch: 223 Hz Highest dynamic pitch when altering pitch from a low note to a high note: 518 Hz Lowest dynamic pitch when altering from a high note to a low note: 231 Hz Highest dynamic pitch in conversational speech: 236 Hz Lowest dynamic pitch in conversational speech: 187 Hz Average time patient was able to sustain /s/: 12 seconds Average time patient was able to sustain /z/: 5 seconds s/z ratio : 2.4  Patient Education: Patient provided with written and verbal instruction in abdominal  breathing, breath support exercises, and initial flow phonation exercises.     SLP Education - 04/08/20 1454    Education Details Results and recommendations    Person(s) Educated Patient    Methods Explanation;Handout    Comprehension Verbalized understanding              SLP Long Term Goals - 04/08/20 1507      SLP LONG TERM GOAL #1   Title The patient will demonstrate independent understanding of vocal hygiene concepts and extrinsic laryngeal muscle stretches.    Time 8    Period Weeks    Status New    Target Date 06/03/20      SLP LONG TERM GOAL #2   Title The patient will be independent for abdominal breathing and breath support exercises.    Time 8    Period Weeks    Status New    Target Date 06/03/20      SLP LONG TERM GOAL #3   Title The patient will minimize vocal tension via resonant voice therapy (or comparable technique) with min SLP cues with 80% accuracy.    Time 8    Period Weeks    Status New    Target Date 06/03/20      SLP LONG TERM  GOAL #4   Title The patient will maintain relaxed phonation / oral resonance for paragraph length recitation with 80% accuracy.    Time 8    Period Weeks    Status New    Target Date 06/03/20            Plan - 04/08/20 1506    Clinical Impression Statement This 59 year old woman under the care of Dr. Pryor Ochoa, with muscle tension dysphonia, is presenting with moderate-severe dysphonia.  The patient demonstrates hoarse vocal quality, reduced breath control for speech,  excessive pharyngeal resonance, strained/tense phonation, vocal fatigue, and extrinsic/intrinsic laryngeal tension. She will benefit from voice therapy for education, to improve breath support, improve tone focus, promote easy flow phonation, and learn techniques to use her voice better and with less effort.    Speech Therapy Frequency 2x / week    Duration Other (comment)   8 weeks   Treatment/Interventions SLP instruction and feedback;Patient/family education    Potential to Achieve Goals Good    Potential Considerations Ability to learn/carryover information;Previous level of function;Co-morbidities;Severity of impairments;Cooperation/participation level;Medical prognosis;Family/community support;Pain level    SLP Home Exercise Plan Provided    Consulted and Agree with Plan of Care Patient           Patient will benefit from skilled therapeutic intervention in order to improve the following deficits and impairments:   Dysphonia - Plan: SLP plan of care cert/re-cert    Problem List Patient Active Problem List   Diagnosis Date Noted  . Vitamin D deficiency 08/22/2019  . History of thrombosis 08/22/2019  . Chronic fatigue 07/17/2019  . Osteoporosis 05/12/2019  . STEC (Shiga toxin-producing Escherichia coli) infection 04/06/2019  . Diverticulitis 04/06/2019  . Thrombocytopenia ([place]) 01/23/2019  . Leukopenia 01/23/2019  . Lymphedema of both lower extremities 01/16/2019  . Polyarthralgia 10/14/2018  . Headache  disorder 10/14/2018  . Chronic daily headache 10/11/2018  . COPD (chronic obstructive pulmonary disease) ([place]) 10/11/2018  . Thoracic aortic atherosclerosis ([place]) 10/11/2018  . Multiple episodes of deep venous thrombosis ([place]) 09/17/2018  . IBS (irritable bowel syndrome) 09/17/2018  . Healthcare maintenance 09/17/2018  . Primary osteoarthritis of right hip 09/03/2018  . Presence of right hip implant 09/03/2018  . Morbid obesity with BMI of 45.0-49.9, adult ([place]) 08/23/2018  . Shortness of breath 08/22/2018  . History of ductal carcinoma in situ (DCIS) of breast 07/20/2018  . Aromatase inhibitor use 07/20/2018  . Abnormal findings on auscultation 07/16/2018  . Encounter for long-term (current) use of high-risk medication 07/16/2018  . Fibromyalgia 07/16/2018  . Other forms of systemic lupus erythematosus ([place]) 07/16/2018  . Other osteoporosis without current pathological fracture 07/16/2018  . Seronegative arthritis 07/16/2018    Leroy Sea, MS/CCC- SLP  Lou Miner 04/08/2020, 3:11 PM  [place] MAIN [facility] SERVICES [address] [place], [place], [address] Phone: [phone]   Fax:  [phone]  Name: Erika Slaby MRN: [id number] Date of Birth: 1960-08-08

## 2020-04-13 ENCOUNTER — Ambulatory Visit
Payer: Medicare Other | Attending: Student in an Organized Health Care Education/Training Program | Admitting: Student in an Organized Health Care Education/Training Program

## 2020-04-13 ENCOUNTER — Inpatient Hospital Stay: Payer: Medicare Other | Attending: Oncology

## 2020-04-13 ENCOUNTER — Other Ambulatory Visit: Payer: Self-pay

## 2020-04-13 ENCOUNTER — Ambulatory Visit: Payer: Medicare Other | Admitting: Speech Pathology

## 2020-04-13 ENCOUNTER — Encounter: Payer: Self-pay | Admitting: Student in an Organized Health Care Education/Training Program

## 2020-04-13 VITALS — BP 132/56 | HR 67 | Temp 97.1°F | Resp 18 | Ht <= 58 in | Wt 186.0 lb

## 2020-04-13 DIAGNOSIS — D0512 Intraductal carcinoma in situ of left breast: Secondary | ICD-10-CM | POA: Diagnosis not present

## 2020-04-13 DIAGNOSIS — I82509 Chronic embolism and thrombosis of unspecified deep veins of unspecified lower extremity: Secondary | ICD-10-CM | POA: Diagnosis present

## 2020-04-13 DIAGNOSIS — M328 Other forms of systemic lupus erythematosus: Secondary | ICD-10-CM | POA: Diagnosis present

## 2020-04-13 DIAGNOSIS — M5416 Radiculopathy, lumbar region: Secondary | ICD-10-CM | POA: Insufficient documentation

## 2020-04-13 DIAGNOSIS — Z452 Encounter for adjustment and management of vascular access device: Secondary | ICD-10-CM | POA: Diagnosis present

## 2020-04-13 DIAGNOSIS — R490 Dysphonia: Secondary | ICD-10-CM

## 2020-04-13 DIAGNOSIS — G894 Chronic pain syndrome: Secondary | ICD-10-CM | POA: Diagnosis present

## 2020-04-13 DIAGNOSIS — M797 Fibromyalgia: Secondary | ICD-10-CM

## 2020-04-13 DIAGNOSIS — M2550 Pain in unspecified joint: Secondary | ICD-10-CM | POA: Insufficient documentation

## 2020-04-13 DIAGNOSIS — M1380 Other specified arthritis, unspecified site: Secondary | ICD-10-CM | POA: Diagnosis present

## 2020-04-13 DIAGNOSIS — M1611 Unilateral primary osteoarthritis, right hip: Secondary | ICD-10-CM | POA: Diagnosis not present

## 2020-04-13 DIAGNOSIS — Z95828 Presence of other vascular implants and grafts: Secondary | ICD-10-CM

## 2020-04-13 MED ORDER — HYDROCODONE-ACETAMINOPHEN 7.5-325 MG PO TABS: 1.0000 | ORAL_TABLET | Freq: Two times a day (BID) | ORAL | 0 refills | Status: DC | PRN

## 2020-04-13 MED ORDER — HEPARIN SOD (PORK) LOCK FLUSH 100 UNIT/ML IV SOLN
INTRAVENOUS | Status: AC
  Filled 2020-04-13: qty 5

## 2020-04-13 MED ORDER — SODIUM CHLORIDE 0.9% FLUSH
10.0000 mL | INTRAVENOUS | Status: DC | PRN
  Administered 2020-04-13: 10 mL via INTRAVENOUS
  Filled 2020-04-13: qty 10

## 2020-04-13 MED ORDER — HEPARIN SOD (PORK) LOCK FLUSH 100 UNIT/ML IV SOLN
500.0000 [IU] | Freq: Once | INTRAVENOUS | Status: AC
  Administered 2020-04-13: 500 [IU] via INTRAVENOUS
  Filled 2020-04-13: qty 5
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
## 2020-04-13 NOTE — Progress Notes (Signed)
Nursing Pain Medication Assessment:  Safety precautions to be maintained throughout the outpatient stay will include: orient to surroundings, keep bed in low position, maintain call bell within reach at all times, provide assistance with transfer out of bed and ambulation.  Medication Inspection Compliance: Pill count conducted under aseptic conditions, in front of the patient. Neither the pills nor the bottle was removed from the patient's sight at any time. Once count was completed pills were immediately returned to the patient in their original bottle.  Medication: Hydrocodone/APAP Pill/Patch Count: 0 of 60 pills remain Pill/Patch Appearance: Markings consistent with prescribed medication Bottle Appearance: Standard pharmacy container. Clearly labeled. Filled Date: 08 / 09 / 2021 Last Medication intake:  Yesterday

## 2020-04-14 ENCOUNTER — Encounter: Payer: Self-pay | Admitting: Speech Pathology

## 2020-04-14 NOTE — Therapy (Signed)
[place] MAIN [facility] SERVICES [address] [place], [place], [address] Phone: [phone]   Fax:  [phone]  Speech Language Pathology Treatment  Patient Details  Name: Victoria [name] MRN: [id number] Date of Birth: 1961-01-18 Referring Provider (SLP): Dr. Pryor Ochoa   Encounter Date: 04/13/2020   End of Session - 04/14/20 1024    Number of Visits 17    Date for SLP Re-Evaluation 06/03/20    Authorization Type Medicare    Authorization Time Period Start 04/07/2020    Authorization - Visit Number 2    Progress Note Due on Visit 10    SLP Start Time 1300    SLP Stop Time  1355    SLP Time Calculation (min) 55 min    Activity Tolerance Patient tolerated treatment well           Past Medical History:  Diagnosis Date  . Allergy   . Asthma   . Cancer ([place])    Breast Left, DCIS 2018  . Collagen vascular disease (HCC)    Lupus  . COPD (chronic obstructive pulmonary disease) ([place])   . Diverticulitis   . Fibromyalgia   . GERD (gastroesophageal reflux disease)   . Hearing impaired person, left   . IBS (irritable bowel syndrome)   . Lupus ([place])   . Mitral valve disorder   . MS (multiple sclerosis) ([place])   . Neuromuscular disorder ([place])   . Osteoarthritis   . Osteoporosis 05/12/2019  . Personal history of radiation therapy 2017   left   . Rheumatoid arthritis ([place])   . Seizures ([place])     Past Surgical History:  Procedure Laterality Date  . ABDOMINAL HYSTERECTOMY    . APPENDECTOMY    . BREAST EXCISIONAL BIOPSY Left 2017   lumpectomy with radation  . BREAST EXCISIONAL BIOPSY Left 2004   neg surgical bx   . BREAST LUMPECTOMY Left 2017   DCIS  . BREAST SURGERY    . CHOLECYSTECTOMY    . COLONOSCOPY WITH PROPOFOL N/A 07/07/2019   Procedure: COLONOSCOPY WITH PROPOFOL;  Surgeon: Toledo, Benay Pike, MD;  Location: [facility] ENDOSCOPY;  Service: Gastroenterology;  Laterality: N/A;  . ESOPHAGOGASTRODUODENOSCOPY (EGD) WITH PROPOFOL N/A 07/07/2019    Procedure: ESOPHAGOGASTRODUODENOSCOPY (EGD) WITH PROPOFOL;  Surgeon: Toledo, Benay Pike, MD;  Location: [facility] ENDOSCOPY;  Service: Gastroenterology;  Laterality: N/A;  . PATENT DUCTUS ARTERIOUS REPAIR    . RECONSTRUCTION TENDON PULLEY HAND Left   . TOTAL HIP ARTHROPLASTY Right     There were no vitals filed for this visit.   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       Treatment Provided   Treatment provided Cognitive-Linquistic      Pain Assessment   Pain Assessment No/denies pain      Cognitive-Linquistic Treatment   Treatment focused on Voice    Skilled Treatment The patient was provided with written and verbal teaching regarding vocal hygiene.  The patient was provided with written and verbal teaching regarding neck, shoulder, tongue, and throat stretches exercises to promote relaxed phonation.   The patient was provided with written and verbal teaching regarding  breath support exercises.  The patient was provided with verbal and written Flow phonation therapy: Skill Level One: establish airflow release: Unarticulated: greatest success with sustained fricative, demonstrating longer duration of unimpeded airflow.  Added changes in airflow velocity with sustained fricatives.  Articulated Skill Level: patient is successfully releasing thoracic and laryngeal tension for unvoiced articulated airflow.  Phonation: Patient able to generate straw phonation and voiced fricatives with less tension.        Assessment / Recommendations / Plan   Plan Continue with current plan of care      Progression Toward Goals   Progression toward goals Progressing toward goals             SLP Education - 04/14/20 1024    Education Details Breath support, flow phonation, airflow    Person(s) Educated Patient    Methods Explanation;Demonstration;Verbal cues;Handout    Comprehension Verbalized understanding;Need further instruction              SLP Long Term Goals - 04/08/20 1507      SLP LONG TERM GOAL #1   Title The patient will demonstrate independent understanding of vocal hygiene concepts and extrinsic laryngeal muscle stretches.    Time 8    Period Weeks    Status New    Target Date 06/03/20      SLP LONG TERM GOAL #2   Title The patient will be independent for abdominal breathing and breath support exercises.    Time 8    Period Weeks    Status New    Target Date 06/03/20      SLP LONG TERM GOAL #3   Title The patient will minimize vocal tension via resonant voice therapy (or comparable technique) with min SLP cues with 80% accuracy.    Time 8    Period Weeks    Status New    Target Date 06/03/20      SLP LONG TERM GOAL #4   Title The patient will maintain relaxed phonation / oral resonance for paragraph length recitation with 80% accuracy.    Time 8    Period Weeks    Status New    Target Date 06/03/20            Plan - 04/14/20 1025    Clinical Impression Statement Patient is consistently able to sustain unvoiced airflow without thoracic or laryngeal tensing.  She demonstrates less laryngeal tension with voiced fricatives.    Speech Therapy Frequency 2x / week    Duration Other (comment)    Treatment/Interventions SLP instruction and feedback;Patient/family education    Potential to Achieve Goals Good    Potential Considerations Ability to learn/carryover information;Previous level of function;Co-morbidities;Severity of impairments;Cooperation/participation level;Medical prognosis;Family/community support;Pain level    SLP Home Exercise Plan Provided    Consulted and Agree with Plan of Care Patient           Patient will benefit  from skilled therapeutic intervention in order to improve the following deficits and impairments:   Dysphonia    Problem List Patient Active Problem List   Diagnosis Date Noted  . Vitamin D deficiency 08/22/2019  . History of thrombosis 08/22/2019  . Chronic fatigue 07/17/2019  . Osteoporosis 05/12/2019  . STEC (Shiga toxin-producing Escherichia coli) infection 04/06/2019  . Diverticulitis 04/06/2019  . Thrombocytopenia ([place]) 01/23/2019  . Leukopenia 01/23/2019  . Lymphedema of both lower extremities 01/16/2019  . Polyarthralgia 10/14/2018  . Headache disorder 10/14/2018  . Chronic daily headache 10/11/2018  . COPD (  chronic obstructive pulmonary disease) ([place]) 10/11/2018  . Thoracic aortic atherosclerosis ([place]) 10/11/2018  . Multiple episodes of deep venous thrombosis ([place]) 09/17/2018  . IBS (irritable bowel syndrome) 09/17/2018  . Healthcare maintenance 09/17/2018  . Primary osteoarthritis of right hip 09/03/2018  . Presence of right hip implant 09/03/2018  . Morbid obesity with BMI of 45.0-49.9, adult ([place]) 08/23/2018  . Shortness of breath 08/22/2018  . History of ductal carcinoma in situ (DCIS) of breast 07/20/2018  . Aromatase inhibitor use 07/20/2018  . Abnormal findings on auscultation 07/16/2018  . Encounter for long-term (current) use of high-risk medication 07/16/2018  . Fibromyalgia 07/16/2018  . Other forms of systemic lupus erythematosus ([place]) 07/16/2018  . Other osteoporosis without current pathological fracture 07/16/2018  . Seronegative arthritis 07/16/2018   Leroy Sea, MS/CCC- SLP  Lou Miner 04/14/2020, 10:27 AM  [place] MAIN [facility] SERVICES [address] [place], [place], [address] Phone: [phone]   Fax:  [phone]   Name: Victoria [name] MRN: [id number] Date of Birth: 08/02/1961

## 2020-04-15 ENCOUNTER — Ambulatory Visit: Payer: Medicare Other | Admitting: Speech Pathology

## 2020-04-19 ENCOUNTER — Ambulatory Visit: Payer: Medicare Other | Admitting: Speech Pathology

## 2020-04-20 ENCOUNTER — Other Ambulatory Visit (HOSPITAL_COMMUNITY): Payer: Self-pay | Admitting: Neurology

## 2020-04-20 ENCOUNTER — Other Ambulatory Visit: Payer: Self-pay | Admitting: Neurology

## 2020-04-20 DIAGNOSIS — R4182 Altered mental status, unspecified: Secondary | ICD-10-CM

## 2020-04-21 ENCOUNTER — Encounter: Payer: Self-pay | Admitting: Speech Pathology

## 2020-04-21 ENCOUNTER — Other Ambulatory Visit: Payer: Self-pay

## 2020-04-21 ENCOUNTER — Ambulatory Visit: Payer: Medicare Other | Admitting: Speech Pathology

## 2020-04-21 DIAGNOSIS — R490 Dysphonia: Secondary | ICD-10-CM

## 2020-04-21 NOTE — Therapy (Signed)
[place] MAIN [facility] SERVICES [address] [place], [place], [address] Phone: [phone]   Fax:  [phone]  Speech Language Pathology Treatment  Patient Details  Name: Victoria [name] MRN: [id number] Date of Birth: 08/19/1960 Referring Provider (SLP): Dr. Pryor Ochoa   Encounter Date: 04/21/2020   End of Session - 04/21/20 1635    Visit Number 3    Number of Visits 17    Date for SLP Re-Evaluation 06/03/20    Authorization Type Medicare    Authorization Time Period Start 04/07/2020    Authorization - Visit Number 3    Progress Note Due on Visit 10    SLP Start Time 1100    SLP Stop Time  1200    SLP Time Calculation (min) 60 min    Activity Tolerance Patient tolerated treatment well           Past Medical History:  Diagnosis Date  . Allergy   . Asthma   . Cancer ([place])    Breast Left, DCIS 2018  . Collagen vascular disease (HCC)    Lupus  . COPD (chronic obstructive pulmonary disease) ([place])   . Diverticulitis   . Fibromyalgia   . GERD (gastroesophageal reflux disease)   . Hearing impaired person, left   . IBS (irritable bowel syndrome)   . Lupus ([place])   . Mitral valve disorder   . MS (multiple sclerosis) ([place])   . Neuromuscular disorder ([place])   . Osteoarthritis   . Osteoporosis 05/12/2019  . Personal history of radiation therapy 2017   left   . Rheumatoid arthritis ([place])   . Seizures ([place])     Past Surgical History:  Procedure Laterality Date  . ABDOMINAL HYSTERECTOMY    . APPENDECTOMY    . BREAST EXCISIONAL BIOPSY Left 2017   lumpectomy with radation  . BREAST EXCISIONAL BIOPSY Left 2004   neg surgical bx   . BREAST LUMPECTOMY Left 2017   DCIS  . BREAST SURGERY    . CHOLECYSTECTOMY    . COLONOSCOPY WITH PROPOFOL N/A 07/07/2019   Procedure: COLONOSCOPY WITH PROPOFOL;  Surgeon: Toledo, Benay Pike, MD;  Location: [facility];  Service: Gastroenterology;  Laterality: N/A;  . ESOPHAGOGASTRODUODENOSCOPY (EGD) WITH  PROPOFOL N/A 07/07/2019   Procedure: ESOPHAGOGASTRODUODENOSCOPY (EGD) WITH PROPOFOL;  Surgeon: Toledo, Benay Pike, MD;  Location: [facility];  Service: Gastroenterology;  Laterality: N/A;  . PATENT DUCTUS ARTERIOUS REPAIR    . RECONSTRUCTION TENDON PULLEY HAND Left   . TOTAL HIP ARTHROPLASTY Right     There were no vitals filed for this visit.          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       Treatment Provided   Treatment provided Cognitive-Linquistic      Pain Assessment   Pain Assessment No/denies pain      Cognitive-Linquistic Treatment   Treatment focused on Voice    Skilled Treatment The patient was provided with written and verbal teaching regarding vocal hygiene.  The patient was provided with written and verbal teaching regarding breath support exercises.  The patient was provided with verbal and written Flow phonation therapy: Skill Level One: establish airflow release: Unarticulated: greatest success with sustained fricative, demonstrating longer duration of unimpeded airflow.  Added changes in airflow velocity with sustained fricatives.  Articulated Skill  Level: patient is successfully releasing thoracic and laryngeal tension for unvoiced articulated airflow.  Phonation: Patient able to generate sustained unarticulated phonation with good vocal quality.  Patient imitates words and phrases with good vocal quality with 75% accuracy.      Assessment / Recommendations / Plan   Plan Continue with current plan of care      Progression Toward Goals   Progression toward goals Progressing toward goals            SLP Education - 04/21/20 1635    Education Details Breath support, flow phonation, oral resonance    Person(s) Educated Patient    Methods Explanation;Demonstration;Verbal  cues;Handout    Comprehension Verbalized understanding;Need further instruction              SLP Long Term Goals - 04/08/20 1507      SLP LONG TERM GOAL #1   Title The patient will demonstrate independent understanding of vocal hygiene concepts and extrinsic laryngeal muscle stretches.    Time 8    Period Weeks    Status New    Target Date 06/03/20      SLP LONG TERM GOAL #2   Title The patient will be independent for abdominal breathing and breath support exercises.    Time 8    Period Weeks    Status New    Target Date 06/03/20      SLP LONG TERM GOAL #3   Title The patient will minimize vocal tension via resonant voice therapy (or comparable technique) with min SLP cues with 80% accuracy.    Time 8    Period Weeks    Status New    Target Date 06/03/20      SLP LONG TERM GOAL #4   Title The patient will maintain relaxed phonation / oral resonance for paragraph length recitation with 80% accuracy.    Time 8    Period Weeks    Status New    Target Date 06/03/20            Plan - 04/21/20 1636    Clinical Impression Statement The patient is able to generate clear vocal quality with flow phonation technique.  She can inconsistently incorporate the flow phonation with words and phrases and recognizes tense phonations.    Speech Therapy Frequency 2x / week    Treatment/Interventions SLP instruction and feedback;Patient/family education    Potential to Achieve Goals Good    Potential Considerations Ability to learn/carryover information;Previous level of function;Co-morbidities;Severity of impairments;Cooperation/participation level;Medical prognosis;Family/community support;Pain level    SLP Home Exercise Plan Provided    Consulted and Agree with Plan of Care Patient           Patient will benefit from skilled therapeutic intervention in order to improve the following deficits and impairments:   Dysphonia    Problem List Patient Active Problem List    Diagnosis Date Noted  . Vitamin D deficiency 08/22/2019  . History of thrombosis 08/22/2019  . Chronic fatigue 07/17/2019  . Osteoporosis 05/12/2019  . STEC (Shiga toxin-producing Escherichia coli) infection 04/06/2019  . Diverticulitis 04/06/2019  . Thrombocytopenia ([place]) 01/23/2019  . Leukopenia 01/23/2019  . Lymphedema of both lower extremities 01/16/2019  . Polyarthralgia 10/14/2018  . Headache disorder 10/14/2018  . Chronic daily headache 10/11/2018  . COPD (chronic obstructive pulmonary disease) ([place]) 10/11/2018  . Thoracic aortic atherosclerosis ([place]) 10/11/2018  . Multiple episodes of deep venous thrombosis ([place]) 09/17/2018  . IBS (irritable bowel syndrome) 09/17/2018  . Healthcare  maintenance 09/17/2018  . Primary osteoarthritis of right hip 09/03/2018  . Presence of right hip implant 09/03/2018  . Morbid obesity with BMI of 45.0-49.9, adult ([place]) 08/23/2018  . Shortness of breath 08/22/2018  . History of ductal carcinoma in situ (DCIS) of breast 07/20/2018  . Aromatase inhibitor use 07/20/2018  . Abnormal findings on auscultation 07/16/2018  . Encounter for long-term (current) use of high-risk medication 07/16/2018  . Fibromyalgia 07/16/2018  . Other forms of systemic lupus erythematosus ([place]) 07/16/2018  . Other osteoporosis without current pathological fracture 07/16/2018  . Seronegative arthritis 07/16/2018   Leroy Sea, MS/CCC- SLP  Lou Miner 04/21/2020, 4:37 PM  [place] MAIN [facility] SERVICES [address] [place], [place], [address] Phone: [phone]   Fax:  [phone]   Name: Victoria [name] MRN: [id number] Date of Birth: [birth year]

## 2020-04-26 ENCOUNTER — Ambulatory Visit: Payer: Medicare Other | Admitting: Speech Pathology

## 2020-04-26 ENCOUNTER — Other Ambulatory Visit: Payer: Self-pay

## 2020-04-26 ENCOUNTER — Encounter: Payer: Self-pay | Admitting: Speech Pathology

## 2020-04-26 DIAGNOSIS — R490 Dysphonia: Secondary | ICD-10-CM

## 2020-04-26 NOTE — Therapy (Signed)
[place] MAIN [facility] SERVICES [address] [place], [place], [address] Phone: [phone]   Fax:  [phone]  Speech Language Pathology Treatment  Patient Details  Name: Victoria [name] MRN: [id number] Date of Birth: April 06, 1961 Referring Provider (SLP): Dr. Pryor Ochoa   Encounter Date: 04/26/2020   End of Session - 04/26/20 1603    Visit Number 4    Number of Visits 17    Date for SLP Re-Evaluation 06/03/20    Authorization Type Medicare    Authorization Time Period Start 04/07/2020    Authorization - Visit Number 4    Progress Note Due on Visit 10    SLP Start Time 1100    SLP Stop Time  1145    SLP Time Calculation (min) 45 min    Activity Tolerance Patient tolerated treatment well           Past Medical History:  Diagnosis Date  . Allergy   . Asthma   . Cancer ([place])    Breast Left, DCIS 2018  . Collagen vascular disease (HCC)    Lupus  . COPD (chronic obstructive pulmonary disease) ([place])   . Diverticulitis   . Fibromyalgia   . GERD (gastroesophageal reflux disease)   . Hearing impaired person, left   . IBS (irritable bowel syndrome)   . Lupus ([place])   . Mitral valve disorder   . MS (multiple sclerosis) ([place])   . Neuromuscular disorder ([place])   . Osteoarthritis   . Osteoporosis 05/12/2019  . Personal history of radiation therapy 2017   left   . Rheumatoid arthritis ([place])   . Seizures ([place])     Past Surgical History:  Procedure Laterality Date  . ABDOMINAL HYSTERECTOMY    . APPENDECTOMY    . BREAST EXCISIONAL BIOPSY Left 2017   lumpectomy with radation  . BREAST EXCISIONAL BIOPSY Left 2004   neg surgical bx   . BREAST LUMPECTOMY Left 2017   DCIS  . BREAST SURGERY    . CHOLECYSTECTOMY    . COLONOSCOPY WITH PROPOFOL N/A 07/07/2019   Procedure: COLONOSCOPY WITH PROPOFOL;  Surgeon: Toledo, Benay Pike, MD;  Location: [facility];  Service: Gastroenterology;  Laterality: N/A;  . ESOPHAGOGASTRODUODENOSCOPY (EGD) WITH  PROPOFOL N/A 07/07/2019   Procedure: ESOPHAGOGASTRODUODENOSCOPY (EGD) WITH PROPOFOL;  Surgeon: Toledo, Benay Pike, MD;  Location: [facility];  Service: Gastroenterology;  Laterality: N/A;  . PATENT DUCTUS ARTERIOUS REPAIR    . RECONSTRUCTION TENDON PULLEY HAND Left   . TOTAL HIP ARTHROPLASTY Right     There were no vitals filed for this visit.   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       Pain Assessment   Pain Assessment No/denies pain      Cognitive-Linquistic Treatment   Treatment focused on Voice    Skilled Treatment The patient was provided with written and verbal teaching regarding vocal hygiene.  The patient was provided with written and verbal teaching regarding breath support exercises.  The patient was provided with verbal and written Flow phonation therapy: Skill Level One: establish airflow release: Unarticulated: greatest success with sustained fricative, demonstrating longer  duration of unimpeded airflow (up to 15 seconds).  Added changes in airflow velocity with sustained fricatives.   Phonation: Patient able to generate sustained unarticulated phonation with good vocal quality.  Patient reads words, phrases, and sentences with good vocal quality with 81% accuracy.  Generate short sentences with good quality voice with 75% accuracy.  Good vocal quality in off the cuff remarks with 40% accuracy.      Assessment / Recommendations / Plan   Plan Continue with current plan of care      Progression Toward Goals   Progression toward goals Progressing toward goals            SLP Education - 04/26/20 1603    Education Details Relaxed phonation     Person(s) Educated Patient    Methods Explanation    Comprehension Verbalized understanding              SLP Long Term Goals - 04/08/20 1507      SLP LONG TERM GOAL #1   Title The patient will demonstrate independent understanding of vocal hygiene concepts and extrinsic laryngeal muscle stretches.    Time 8    Period Weeks    Status New    Target Date 06/03/20      SLP LONG TERM GOAL #2   Title The patient will be independent for abdominal breathing and breath support exercises.    Time 8    Period Weeks    Status New    Target Date 06/03/20      SLP LONG TERM GOAL #3   Title The patient will minimize vocal tension via resonant voice therapy (or comparable technique) with min SLP cues with 80% accuracy.    Time 8    Period Weeks    Status New    Target Date 06/03/20      SLP LONG TERM GOAL #4   Title The patient will maintain relaxed phonation / oral resonance for paragraph length recitation with 80% accuracy.    Time 8    Period Weeks    Status New    Target Date 06/03/20            Plan - 04/26/20 1603    Clinical Impression Statement The patient is able to generate clear vocal quality with flow phonation technique.  She is beginning to generalize improved vocal quality to conversational speech.    Speech Therapy Frequency 2x / week    Duration 8 weeks    Treatment/Interventions SLP instruction and feedback;Patient/family education    Potential to Achieve Goals Good    Potential Considerations Ability to learn/carryover information;Previous level of function;Co-morbidities;Severity of impairments;Cooperation/participation level;Medical prognosis;Family/community support;Pain level    SLP Home Exercise Plan Provided    Consulted and Agree with Plan of Care Patient           Patient will benefit from skilled therapeutic intervention in order to improve the following deficits and impairments:   Dysphonia    Problem List Patient Active Problem List    Diagnosis Date Noted  . Vitamin D deficiency 08/22/2019  . History of thrombosis 08/22/2019  . Chronic fatigue 07/17/2019  . Osteoporosis 05/12/2019  . STEC (Shiga toxin-producing Escherichia coli) infection 04/06/2019  . Diverticulitis 04/06/2019  . Thrombocytopenia ([place]) 01/23/2019  . Leukopenia 01/23/2019  . Lymphedema of both lower extremities 01/16/2019  . Polyarthralgia 10/14/2018  . Headache disorder 10/14/2018  . Chronic daily headache 10/11/2018  . COPD (chronic obstructive pulmonary disease) ([place]) 10/11/2018  . Thoracic  aortic atherosclerosis ([place]) 10/11/2018  . Multiple episodes of deep venous thrombosis ([place]) 09/17/2018  . IBS (irritable bowel syndrome) 09/17/2018  . Healthcare maintenance 09/17/2018  . Primary osteoarthritis of right hip 09/03/2018  . Presence of right hip implant 09/03/2018  . Morbid obesity with BMI of 45.0-49.9, adult ([place]) 08/23/2018  . Shortness of breath 08/22/2018  . History of ductal carcinoma in situ (DCIS) of breast 07/20/2018  . Aromatase inhibitor use 07/20/2018  . Abnormal findings on auscultation 07/16/2018  . Encounter for long-term (current) use of high-risk medication 07/16/2018  . Fibromyalgia 07/16/2018  . Other forms of systemic lupus erythematosus ([place]) 07/16/2018  . Other osteoporosis without current pathological fracture 07/16/2018  . Seronegative arthritis 07/16/2018   Leroy Sea, MS/CCC- SLP  Lou Miner 04/26/2020, 4:06 PM  [place] MAIN [facility] SERVICES [address] [place], [place], [address] Phone: [phone]   Fax:  [phone]   Name: Victoria [name] MRN: [id number] Date of Birth: August 17, 1960

## 2020-04-28 ENCOUNTER — Ambulatory Visit: Payer: Medicare Other | Admitting: Speech Pathology

## 2020-04-28 ENCOUNTER — Encounter: Payer: Self-pay | Admitting: Speech Pathology

## 2020-04-28 ENCOUNTER — Other Ambulatory Visit: Payer: Self-pay

## 2020-04-28 DIAGNOSIS — R490 Dysphonia: Secondary | ICD-10-CM

## 2020-04-28 NOTE — Therapy (Signed)
[place] MAIN [facility] SERVICES [address] [place], [place], [address] Phone: [phone]   Fax:  [phone]  Speech Language Pathology Treatment  Patient Details  Name: Victoria [name] MRN: [id number] Date of Birth: [birth year] Referring Provider (SLP): Dr. Pryor Ochoa   Encounter Date: 04/28/2020   End of Session - 04/28/20 1433    Visit Number 5    Number of Visits 17    Date for SLP Re-Evaluation 06/03/20    Authorization Type Medicare    Authorization Time Period Start 04/07/2020    Authorization - Visit Number 5    Progress Note Due on Visit 10    SLP Start Time 1110    SLP Stop Time  1155    SLP Time Calculation (min) 45 min    Activity Tolerance Patient tolerated treatment well;Patient limited by pain           Past Medical History:  Diagnosis Date  . Allergy   . Asthma   . Cancer ([place])    Breast Left, DCIS 2018  . Collagen vascular disease (HCC)    Lupus  . COPD (chronic obstructive pulmonary disease) ([place])   . Diverticulitis   . Fibromyalgia   . GERD (gastroesophageal reflux disease)   . Hearing impaired person, left   . IBS (irritable bowel syndrome)   . Lupus ([place])   . Mitral valve disorder   . MS (multiple sclerosis) ([place])   . Neuromuscular disorder ([place])   . Osteoarthritis   . Osteoporosis 05/12/2019  . Personal history of radiation therapy 2017   left   . Rheumatoid arthritis ([place])   . Seizures ([place])     Past Surgical History:  Procedure Laterality Date  . ABDOMINAL HYSTERECTOMY    . APPENDECTOMY    . BREAST EXCISIONAL BIOPSY Left 2017   lumpectomy with radation  . BREAST EXCISIONAL BIOPSY Left 2004   neg surgical bx   . BREAST LUMPECTOMY Left 2017   DCIS  . BREAST SURGERY    . CHOLECYSTECTOMY    . COLONOSCOPY WITH PROPOFOL N/A 07/07/2019   Procedure: COLONOSCOPY WITH PROPOFOL;  Surgeon: Toledo, Benay Pike, MD;  Location: [facility] ENDOSCOPY;  Service: Gastroenterology;  Laterality: N/A;  .  ESOPHAGOGASTRODUODENOSCOPY (EGD) WITH PROPOFOL N/A 07/07/2019   Procedure: ESOPHAGOGASTRODUODENOSCOPY (EGD) WITH PROPOFOL;  Surgeon: Toledo, Benay Pike, MD;  Location: [facility] ENDOSCOPY;  Service: Gastroenterology;  Laterality: N/A;  . PATENT DUCTUS ARTERIOUS REPAIR    . RECONSTRUCTION TENDON PULLEY HAND Left   . TOTAL HIP ARTHROPLASTY Right     There were no vitals filed for this visit.   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       Treatment Provided   Treatment provided Cognitive-Linquistic      Pain Assessment   Pain Assessment 0-10    Pain Score --   chronic pain, worse today     Cognitive-Linquistic Treatment   Treatment focused on Voice    Skilled Treatment The patient was provided with written and verbal teaching regarding vocal hygiene.  The patient was provided with written and verbal teaching regarding breath support exercises.  The patient was provided with  verbal and written Flow phonation therapy: Skill Level One: establish airflow release: Unarticulated: greatest success with sustained fricative, demonstrating longer duration of unimpeded airflow (up to 15 seconds).  Added changes in airflow velocity with sustained fricatives.   Phonation: Patient able to generate sustained unarticulated phonation with good vocal quality.  Patient reads words, phrases, and sentences with good vocal quality with 75% accuracy.  Generate short sentences with good quality voice with 65% accuracy.  Good vocal quality in off the cuff remarks with 30% accuracy.      Assessment / Recommendations / Plan   Plan Continue with current plan of care      Progression Toward Goals   Progression toward goals Progressing  toward goals            SLP Education - 04/28/20 1432    Education Details Expect increased pain to have deleterious effect on voice    Person(s) Educated Patient    Methods Explanation    Comprehension Verbalized understanding              SLP Long Term Goals - 04/08/20 1507      SLP LONG TERM GOAL #1   Title The patient will demonstrate independent understanding of vocal hygiene concepts and extrinsic laryngeal muscle stretches.    Time 8    Period Weeks    Status New    Target Date 06/03/20      SLP LONG TERM GOAL #2   Title The patient will be independent for abdominal breathing and breath support exercises.    Time 8    Period Weeks    Status New    Target Date 06/03/20      SLP LONG TERM GOAL #3   Title The patient will minimize vocal tension via resonant voice therapy (or comparable technique) with min SLP cues with 80% accuracy.    Time 8    Period Weeks    Status New    Target Date 06/03/20      SLP LONG TERM GOAL #4   Title The patient will maintain relaxed phonation / oral resonance for paragraph length recitation with 80% accuracy.    Time 8    Period Weeks    Status New    Target Date 06/03/20            Plan - 04/28/20 1434    Clinical Impression Statement The patient is able to generate clear vocal quality with flow phonation technique.  She is beginning to generalize improved vocal quality to conversational speech.  Today she reports feeling more pain and had more difficulty maintaining clear vocal quality.    Speech Therapy Frequency 2x / week    Duration 8 weeks    Treatment/Interventions SLP instruction and feedback;Patient/family education    Potential to Achieve Goals Good    Potential Considerations Ability to learn/carryover information;Previous level of function;Co-morbidities;Severity of impairments;Cooperation/participation level;Medical prognosis;Family/community support;Pain level    SLP Home Exercise Plan Provided    Consulted  and Agree with Plan of Care Patient           Patient will benefit from skilled therapeutic intervention in order to improve the following deficits and impairments:   Dysphonia    Problem List Patient Active Problem List   Diagnosis Date Noted  . Vitamin D deficiency 08/22/2019  . History of thrombosis 08/22/2019  . Chronic fatigue 07/17/2019  . Osteoporosis 05/12/2019  . STEC (Shiga toxin-producing Escherichia coli) infection 04/06/2019  . Diverticulitis 04/06/2019  .  Thrombocytopenia ([place]) 01/23/2019  . Leukopenia 01/23/2019  . Lymphedema of both lower extremities 01/16/2019  . Polyarthralgia 10/14/2018  . Headache disorder 10/14/2018  . Chronic daily headache 10/11/2018  . COPD (chronic obstructive pulmonary disease) ([place]) 10/11/2018  . Thoracic aortic atherosclerosis ([place]) 10/11/2018  . Multiple episodes of deep venous thrombosis ([place]) 09/17/2018  . IBS (irritable bowel syndrome) 09/17/2018  . Healthcare maintenance 09/17/2018  . Primary osteoarthritis of right hip 09/03/2018  . Presence of right hip implant 09/03/2018  . Morbid obesity with BMI of 45.0-49.9, adult ([place]) 08/23/2018  . Shortness of breath 08/22/2018  . History of ductal carcinoma in situ (DCIS) of breast 07/20/2018  . Aromatase inhibitor use 07/20/2018  . Abnormal findings on auscultation 07/16/2018  . Encounter for long-term (current) use of high-risk medication 07/16/2018  . Fibromyalgia 07/16/2018  . Other forms of systemic lupus erythematosus ([place]) 07/16/2018  . Other osteoporosis without current pathological fracture 07/16/2018  . Seronegative arthritis 07/16/2018   Leroy Sea, MS/CCC- SLP  Lou Miner 04/28/2020, 2:35 PM  [place] MAIN [facility] SERVICES [address] [place], [place], [address] Phone: [phone]   Fax:  [phone]   Name: Victoria [name] MRN: [id number] Date of Birth: 1961/06/13

## 2020-05-03 ENCOUNTER — Ambulatory Visit: Payer: Medicare Other | Admitting: Speech Pathology

## 2020-05-03 ENCOUNTER — Encounter: Payer: Self-pay | Admitting: Speech Pathology

## 2020-05-03 ENCOUNTER — Other Ambulatory Visit: Payer: Self-pay

## 2020-05-03 DIAGNOSIS — R490 Dysphonia: Secondary | ICD-10-CM | POA: Diagnosis not present

## 2020-05-03 NOTE — Therapy (Signed)
[place] MAIN [facility] SERVICES [address] [place], [place], [address] Phone: [phone]   Fax:  [phone]  Speech Language Pathology Treatment  Patient Details  Name: Victoria [name] MRN: [id number] Date of Birth: [birth year] Referring Provider (SLP): Dr. Pryor Ochoa   Encounter Date: 05/03/2020   End of Session - 05/03/20 1430    Visit Number 6    Number of Visits 17    Date for SLP Re-Evaluation 06/03/20    Authorization Type Medicare    Authorization Time Period Start 04/07/2020    Authorization - Visit Number 6    Progress Note Due on Visit 10    SLP Start Time 1110    SLP Stop Time  1155    SLP Time Calculation (min) 45 min    Activity Tolerance Patient tolerated treatment well;Patient limited by pain           Past Medical History:  Diagnosis Date  . Allergy   . Asthma   . Cancer ([place])    Breast Left, DCIS 2018  . Collagen vascular disease (HCC)    Lupus  . COPD (chronic obstructive pulmonary disease) ([place])   . Diverticulitis   . Fibromyalgia   . GERD (gastroesophageal reflux disease)   . Hearing impaired person, left   . IBS (irritable bowel syndrome)   . Lupus ([place])   . Mitral valve disorder   . MS (multiple sclerosis) ([place])   . Neuromuscular disorder ([place])   . Osteoarthritis   . Osteoporosis 05/12/2019  . Personal history of radiation therapy 2017   left   . Rheumatoid arthritis ([place])   . Seizures ([place])     Past Surgical History:  Procedure Laterality Date  . ABDOMINAL HYSTERECTOMY    . APPENDECTOMY    . BREAST EXCISIONAL BIOPSY Left 2017   lumpectomy with radation  . BREAST EXCISIONAL BIOPSY Left 2004   neg surgical bx   . BREAST LUMPECTOMY Left 2017   DCIS  . BREAST SURGERY    . CHOLECYSTECTOMY    . COLONOSCOPY WITH PROPOFOL N/A 07/07/2019   Procedure: COLONOSCOPY WITH PROPOFOL;  Surgeon: Toledo, Benay Pike, MD;  Location: [facility] ENDOSCOPY;  Service: Gastroenterology;  Laterality: N/A;  .  ESOPHAGOGASTRODUODENOSCOPY (EGD) WITH PROPOFOL N/A 07/07/2019   Procedure: ESOPHAGOGASTRODUODENOSCOPY (EGD) WITH PROPOFOL;  Surgeon: Toledo, Benay Pike, MD;  Location: [facility] ENDOSCOPY;  Service: Gastroenterology;  Laterality: N/A;  . PATENT DUCTUS ARTERIOUS REPAIR    . RECONSTRUCTION TENDON PULLEY HAND Left   . TOTAL HIP ARTHROPLASTY Right     There were no vitals filed for this visit.   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       Treatment Provided   Treatment provided Cognitive-Linquistic      Pain Assessment   Pain Assessment 0-10    Pain Score --   Chronic pain     Cognitive-Linquistic Treatment   Treatment focused on Voice    Skilled Treatment The patient was provided with written and verbal teaching regarding vocal hygiene.  The patient was provided with written and verbal teaching regarding breath support exercises.  The patient was provided with verbal and written Flow  phonation therapy: Skill Level One: establish airflow release: Unarticulated: greatest success with sustained fricative, demonstrating longer duration of unimpeded airflow (up to 15 seconds).  Added changes in airflow velocity with sustained fricatives.   Phonation: Patient able to generate sustained unarticulated phonation with good vocal quality.  Patient reads words, phrases, and sentences with good vocal quality with 75% accuracy.  Generate short sentences with good quality voice with 65% accuracy.  Good vocal quality in off the cuff remarks with 50% accuracy.      Assessment / Recommendations / Plan   Plan Continue with current plan of care      Progression Toward Goals   Progression toward goals Progressing toward goals             SLP Education - 05/03/20 1429    Education Details Stress and pain can have an effect on voice              SLP Long Term Goals - 04/08/20 1507      SLP LONG TERM GOAL #1   Title The patient will demonstrate independent understanding of vocal hygiene concepts and extrinsic laryngeal muscle stretches.    Time 8    Period Weeks    Status New    Target Date 06/03/20      SLP LONG TERM GOAL #2   Title The patient will be independent for abdominal breathing and breath support exercises.    Time 8    Period Weeks    Status New    Target Date 06/03/20      SLP LONG TERM GOAL #3   Title The patient will minimize vocal tension via resonant voice therapy (or comparable technique) with min SLP cues with 80% accuracy.    Time 8    Period Weeks    Status New    Target Date 06/03/20      SLP LONG TERM GOAL #4   Title The patient will maintain relaxed phonation / oral resonance for paragraph length recitation with 80% accuracy.    Time 8    Period Weeks    Status New    Target Date 06/03/20            Plan - 05/03/20 1430    Clinical Impression Statement The patient is able to generate clear vocal quality with flow phonation technique.  She is beginning to generalize improved vocal quality to conversational speech.    Speech Therapy Frequency 2x / week    Duration 8 weeks    Treatment/Interventions SLP instruction and feedback;Patient/family education    Potential to Achieve Goals Good    Potential Considerations Ability to learn/carryover information;Previous level of function;Co-morbidities;Severity of impairments;Cooperation/participation level;Medical prognosis;Family/community support;Pain level    SLP Home Exercise Plan Provided    Consulted and Agree with Plan of Care Patient           Patient will benefit from skilled therapeutic intervention in order to improve the following deficits and impairments:   Dysphonia    Problem List Patient Active Problem  List   Diagnosis Date Noted  . Vitamin D deficiency 08/22/2019  . History of thrombosis 08/22/2019  . Chronic fatigue 07/17/2019  . Osteoporosis 05/12/2019  . STEC (Shiga toxin-producing Escherichia coli) infection 04/06/2019  . Diverticulitis 04/06/2019  . Thrombocytopenia ([place]) 01/23/2019  . Leukopenia 01/23/2019  . Lymphedema of both lower extremities 01/16/2019  . Polyarthralgia 10/14/2018  . Headache disorder 10/14/2018  . Chronic daily headache 10/11/2018  . COPD (chronic obstructive pulmonary  disease) ([place]) 10/11/2018  . Thoracic aortic atherosclerosis ([place]) 10/11/2018  . Multiple episodes of deep venous thrombosis ([place]) 09/17/2018  . IBS (irritable bowel syndrome) 09/17/2018  . Healthcare maintenance 09/17/2018  . Primary osteoarthritis of right hip 09/03/2018  . Presence of right hip implant 09/03/2018  . Morbid obesity with BMI of 45.0-49.9, adult ([place]) 08/23/2018  . Shortness of breath 08/22/2018  . History of ductal carcinoma in situ (DCIS) of breast 07/20/2018  . Aromatase inhibitor use 07/20/2018  . Abnormal findings on auscultation 07/16/2018  . Encounter for long-term (current) use of high-risk medication 07/16/2018  . Fibromyalgia 07/16/2018  . Other forms of systemic lupus erythematosus ([place]) 07/16/2018  . Other osteoporosis without current pathological fracture 07/16/2018  . Seronegative arthritis 07/16/2018   Leroy Sea, MS/CCC- SLP  Lou Miner 05/03/2020, 2:32 PM  [place] MAIN [facility] SERVICES [address] [place], [place], [address] Phone: [phone]   Fax:  [phone]   Name: Victoria [name] MRN: [id number] Date of Birth: [birth year]

## 2020-05-05 ENCOUNTER — Other Ambulatory Visit: Payer: Self-pay

## 2020-05-05 ENCOUNTER — Encounter: Payer: Self-pay | Admitting: Speech Pathology

## 2020-05-05 ENCOUNTER — Ambulatory Visit: Payer: Medicare Other | Admitting: Speech Pathology

## 2020-05-05 DIAGNOSIS — R490 Dysphonia: Secondary | ICD-10-CM

## 2020-05-05 NOTE — Therapy (Signed)
[place] MAIN [facility] SERVICES [address] [place], [place], [address] Phone: [phone]   Fax:  [phone]  Speech Language Pathology Treatment  Patient Details  Name: Victoria [name] MRN: [id number] Date of Birth: May 07, 1961 Referring Provider (SLP): Dr. Pryor Ochoa   Encounter Date: 05/05/2020   End of Session - 05/05/20 1458    Visit Number 7    Number of Visits 17    Date for SLP Re-Evaluation 06/03/20    Authorization Type Medicare    Authorization Time Period Start 04/07/2020    Authorization - Visit Number 7    Progress Note Due on Visit 10    SLP Start Time 1100    SLP Stop Time  1150    SLP Time Calculation (min) 50 min    Activity Tolerance Patient tolerated treatment well           Past Medical History:  Diagnosis Date  . Allergy   . Asthma   . Cancer ([place])    Breast Left, DCIS 2018  . Collagen vascular disease (HCC)    Lupus  . COPD (chronic obstructive pulmonary disease) ([place])   . Diverticulitis   . Fibromyalgia   . GERD (gastroesophageal reflux disease)   . Hearing impaired person, left   . IBS (irritable bowel syndrome)   . Lupus ([place])   . Mitral valve disorder   . MS (multiple sclerosis) ([place])   . Neuromuscular disorder ([place])   . Osteoarthritis   . Osteoporosis 05/12/2019  . Personal history of radiation therapy 2017   left   . Rheumatoid arthritis ([place])   . Seizures ([place])     Past Surgical History:  Procedure Laterality Date  . ABDOMINAL HYSTERECTOMY    . APPENDECTOMY    . BREAST EXCISIONAL BIOPSY Left 2017   lumpectomy with radation  . BREAST EXCISIONAL BIOPSY Left 2004   neg surgical bx   . BREAST LUMPECTOMY Left 2017   DCIS  . BREAST SURGERY    . CHOLECYSTECTOMY    . COLONOSCOPY WITH PROPOFOL N/A 07/07/2019   Procedure: COLONOSCOPY WITH PROPOFOL;  Surgeon: Toledo, Benay Pike, MD;  Location: [facility] ENDOSCOPY;  Service: Gastroenterology;  Laterality: N/A;  . ESOPHAGOGASTRODUODENOSCOPY (EGD) WITH  PROPOFOL N/A 07/07/2019   Procedure: ESOPHAGOGASTRODUODENOSCOPY (EGD) WITH PROPOFOL;  Surgeon: Toledo, Benay Pike, MD;  Location: [facility] ENDOSCOPY;  Service: Gastroenterology;  Laterality: N/A;  . PATENT DUCTUS ARTERIOUS REPAIR    . RECONSTRUCTION TENDON PULLEY HAND Left   . TOTAL HIP ARTHROPLASTY Right     There were no vitals filed for this visit.   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       Treatment Provided   Treatment provided Cognitive-Linquistic      Pain Assessment   Pain Assessment 0-10    Pain Score --   Chronic pain     Cognitive-Linquistic Treatment   Treatment focused on Voice    Skilled Treatment The patient was provided with written and verbal teaching regarding vocal hygiene.  The patient was provided with written and verbal teaching regarding breath support exercises.  The patient was provided with verbal and written Flow phonation therapy: Skill  Level One: establish airflow release: Unarticulated: greatest success with sustained fricative, demonstrating longer duration of unimpeded airflow (up to 15 seconds).  Added changes in airflow velocity with sustained fricatives.   Phonation: Patient able to generate sustained unarticulated phonation with good vocal quality.  Patient reads words, phrases, and sentences with good vocal quality with 75% accuracy.  Generate short sentences with good quality voice with 65% accuracy.  Good vocal quality in off the cuff remarks with 50% accuracy.      Assessment / Recommendations / Plan   Plan Continue with current plan of care      Progression Toward Goals   Progression toward goals Progressing toward goals            SLP Education - 05/05/20 1457     Education Details Effort required to generate clear vocal quality    Person(s) Educated Patient    Methods Explanation    Comprehension Verbalized understanding              SLP Long Term Goals - 04/08/20 1507      SLP LONG TERM GOAL #1   Title The patient will demonstrate independent understanding of vocal hygiene concepts and extrinsic laryngeal muscle stretches.    Time 8    Period Weeks    Status New    Target Date 06/03/20      SLP LONG TERM GOAL #2   Title The patient will be independent for abdominal breathing and breath support exercises.    Time 8    Period Weeks    Status New    Target Date 06/03/20      SLP LONG TERM GOAL #3   Title The patient will minimize vocal tension via resonant voice therapy (or comparable technique) with min SLP cues with 80% accuracy.    Time 8    Period Weeks    Status New    Target Date 06/03/20      SLP LONG TERM GOAL #4   Title The patient will maintain relaxed phonation / oral resonance for paragraph length recitation with 80% accuracy.    Time 8    Period Weeks    Status New    Target Date 06/03/20            Plan - 05/05/20 1459    Clinical Impression Statement The patient is able to generate clear vocal quality with flow phonation technique.  She is beginning to generalize improved vocal quality to conversational speech.    Speech Therapy Frequency 2x / week    Duration 8 weeks    Treatment/Interventions SLP instruction and feedback;Patient/family education    Potential to Achieve Goals Good    Potential Considerations Ability to learn/carryover information;Previous level of function;Co-morbidities;Severity of impairments;Cooperation/participation level;Medical prognosis;Family/community support;Pain level    SLP Home Exercise Plan Provided    Consulted and Agree with Plan of Care Patient           Patient will benefit from skilled therapeutic intervention in order to improve the following deficits and  impairments:   Dysphonia    Problem List Patient Active Problem List   Diagnosis Date Noted  . Vitamin D deficiency 08/22/2019  . History of thrombosis 08/22/2019  . Chronic fatigue 07/17/2019  . Osteoporosis 05/12/2019  . STEC (Shiga toxin-producing Escherichia coli) infection 04/06/2019  . Diverticulitis 04/06/2019  . Thrombocytopenia ([place]) 01/23/2019  . Leukopenia 01/23/2019  . Lymphedema of both lower extremities 01/16/2019  . Polyarthralgia 10/14/2018  . Headache disorder 10/14/2018  .  Chronic daily headache 10/11/2018  . COPD (chronic obstructive pulmonary disease) ([place]) 10/11/2018  . Thoracic aortic atherosclerosis ([place]) 10/11/2018  . Multiple episodes of deep venous thrombosis ([place]) 09/17/2018  . IBS (irritable bowel syndrome) 09/17/2018  . Healthcare maintenance 09/17/2018  . Primary osteoarthritis of right hip 09/03/2018  . Presence of right hip implant 09/03/2018  . Morbid obesity with BMI of 45.0-49.9, adult ([place]) 08/23/2018  . Shortness of breath 08/22/2018  . History of ductal carcinoma in situ (DCIS) of breast 07/20/2018  . Aromatase inhibitor use 07/20/2018  . Abnormal findings on auscultation 07/16/2018  . Encounter for long-term (current) use of high-risk medication 07/16/2018  . Fibromyalgia 07/16/2018  . Other forms of systemic lupus erythematosus ([place]) 07/16/2018  . Other osteoporosis without current pathological fracture 07/16/2018  . Seronegative arthritis 07/16/2018   Leroy Sea, MS/CCC- SLP  Lou Miner 05/05/2020, 3:01 PM  [place] MAIN [facility] SERVICES [address] [place], [place], [address] Phone: [phone]   Fax:  [phone]   Name: Victoria [name] MRN: [id number] Date of Birth: 01-Mar-1961

## 2020-05-07 ENCOUNTER — Ambulatory Visit
Admission: RE | Admit: 2020-05-07 | Discharge: 2020-05-07 | Disposition: A | Discharge status: Home / Self Care | Payer: Medicare Other | Source: Ambulatory Visit | Attending: Neurology | Admitting: Neurology

## 2020-05-07 ENCOUNTER — Other Ambulatory Visit: Payer: Self-pay

## 2020-05-07 DIAGNOSIS — R4182 Altered mental status, unspecified: Secondary | ICD-10-CM | POA: Diagnosis present

## 2020-05-07 MED ORDER — HEPARIN SOD (PORK) LOCK FLUSH 100 UNIT/ML IV SOLN
500.0000 [IU] | INTRAVENOUS | Status: AC | PRN
  Administered 2020-05-07: 500 [IU]
  Filled 2020-05-07: qty 5

## 2020-05-07 MED ORDER — GADOBUTROL 1 MMOL/ML IV SOLN
10.0000 mL | Freq: Once | INTRAVENOUS | Status: AC | PRN
  Administered 2020-05-07: 10 mL via INTRAVENOUS

## 2020-05-10 ENCOUNTER — Ambulatory Visit: Payer: Medicare Other | Attending: Otolaryngology | Admitting: Speech Pathology

## 2020-05-10 ENCOUNTER — Other Ambulatory Visit: Payer: Self-pay

## 2020-05-10 ENCOUNTER — Encounter: Payer: Self-pay | Admitting: Speech Pathology

## 2020-05-10 DIAGNOSIS — R490 Dysphonia: Secondary | ICD-10-CM | POA: Diagnosis present

## 2020-05-10 NOTE — Therapy (Signed)
[place] MAIN [facility] SERVICES [address] [place], [place], [address] Phone: [phone]   Fax:  [phone]  Speech Language Pathology Treatment  Patient Details  Name: Victoria [name] MRN: [id number] Date of Birth: 12/06/1960 Referring Provider (SLP): Dr. Pryor Ochoa   Encounter Date: 05/10/2020   End of Session - 05/10/20 1512    Visit Number 8    Number of Visits 17    Date for SLP Re-Evaluation 06/03/20    Authorization Type Medicare    Authorization Time Period Start 04/07/2020    Authorization - Visit Number 7    Progress Note Due on Visit 10    SLP Start Time 1415    SLP Stop Time  1500    SLP Time Calculation (min) 45 min    Activity Tolerance Patient tolerated treatment well           Past Medical History:  Diagnosis Date  . Allergy   . Asthma   . Cancer ([place])    Breast Left, DCIS 2018  . Collagen vascular disease (HCC)    Lupus  . COPD (chronic obstructive pulmonary disease) ([place])   . Diverticulitis   . Fibromyalgia   . GERD (gastroesophageal reflux disease)   . Hearing impaired person, left   . IBS (irritable bowel syndrome)   . Lupus ([place])   . Mitral valve disorder   . MS (multiple sclerosis) ([place])   . Neuromuscular disorder ([place])   . Osteoarthritis   . Osteoporosis 05/12/2019  . Personal history of radiation therapy 2017   left   . Rheumatoid arthritis ([place])   . Seizures ([place])     Past Surgical History:  Procedure Laterality Date  . ABDOMINAL HYSTERECTOMY    . APPENDECTOMY    . BREAST EXCISIONAL BIOPSY Left 2017   lumpectomy with radation  . BREAST EXCISIONAL BIOPSY Left 2004   neg surgical bx   . BREAST LUMPECTOMY Left 2017   DCIS  . BREAST SURGERY    . CHOLECYSTECTOMY    . COLONOSCOPY WITH PROPOFOL N/A 07/07/2019   Procedure: COLONOSCOPY WITH PROPOFOL;  Surgeon: Toledo, Benay Pike, MD;  Location: [facility];  Service: Gastroenterology;  Laterality: N/A;  . ESOPHAGOGASTRODUODENOSCOPY (EGD) WITH  PROPOFOL N/A 07/07/2019   Procedure: ESOPHAGOGASTRODUODENOSCOPY (EGD) WITH PROPOFOL;  Surgeon: Toledo, Benay Pike, MD;  Location: [facility];  Service: Gastroenterology;  Laterality: N/A;  . PATENT DUCTUS ARTERIOUS REPAIR    . RECONSTRUCTION TENDON PULLEY HAND Left   . TOTAL HIP ARTHROPLASTY Right     There were no vitals filed for this visit.   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       Treatment Provided   Treatment provided Cognitive-Linquistic      Pain Assessment   Pain Assessment Faces    Faces Pain Scale Hurts little more      Cognitive-Linquistic Treatment   Treatment focused on Voice;Patient/family/caregiver education    Skilled Treatment Pt was seen for skilled ST intervention targeting goals for improved voice quality. Pt was able to verbalize 3 vocal hygiene concepts (no throat clearing, increase water intake, get enough sleep). SLP provided written  laryngeal muscle stretches and reviewed them with pt. She was encouraged to practice these exercises daily with abdominal breathing exercises. FLOW PHONATION: Pt was able to produce exaggerated articulation with cues to maximize "downhill" glide to facilitate clear voicing.      Assessment / Recommendations / Plan   Plan Continue with current plan of care      Progression Toward Goals   Progression toward goals Progressing toward goals            SLP Education - 05/10/20 1511    Education Details Reviewed openness with yawn, laryngeal muscle stretches, importance of increased water    Comprehension Verbalized understanding              SLP Long Term Goals - 04/08/20 1507      SLP LONG TERM GOAL #1    Title The patient will demonstrate independent understanding of vocal hygiene concepts and extrinsic laryngeal muscle stretches.    Time 8    Period Weeks    Status New    Target Date 06/03/20      SLP LONG TERM GOAL #2   Title The patient will be independent for abdominal breathing and breath support exercises.    Time 8    Period Weeks    Status New    Target Date 06/03/20      SLP LONG TERM GOAL #3   Title The patient will minimize vocal tension via resonant voice therapy (or comparable technique) with min SLP cues with 80% accuracy.    Time 8    Period Weeks    Status New    Target Date 06/03/20      SLP LONG TERM GOAL #4   Title The patient will maintain relaxed phonation / oral resonance for paragraph length recitation with 80% accuracy.    Time 8    Period Weeks    Status New    Target Date 06/03/20            Plan - 05/10/20 1512    Clinical Impression Statement Pt is able to self-evaluate clear vs raspy vocal quality and indicate what needs to change to clear her vocal quality.    Speech Therapy Frequency 2x / week    Duration 8 weeks    Treatment/Interventions SLP instruction and feedback;Patient/family education    Potential to Achieve Goals Good    Potential Considerations Ability to learn/carryover information;Previous level of function;Co-morbidities;Severity of impairments;Cooperation/participation level;Medical prognosis;Family/community support;Pain level    SLP Home Exercise Plan Provided    Consulted and Agree with Plan of Care Patient           Patient will benefit from skilled therapeutic intervention in order to improve the following deficits and impairments:   Voice hoarseness    Problem List Patient Active Problem List   Diagnosis Date Noted  . Vitamin D deficiency 08/22/2019  . History of thrombosis 08/22/2019  . Chronic fatigue 07/17/2019  . Osteoporosis 05/12/2019  . STEC (Shiga toxin-producing Escherichia coli) infection  04/06/2019  . Diverticulitis 04/06/2019  . Thrombocytopenia ([place]) 01/23/2019  . Leukopenia 01/23/2019  . Lymphedema of both lower extremities 01/16/2019  . Polyarthralgia 10/14/2018  . Headache disorder 10/14/2018  . Chronic daily headache 10/11/2018  . COPD (chronic obstructive pulmonary disease) ([place]) 10/11/2018  . Thoracic aortic atherosclerosis ([place]) 10/11/2018  . Multiple episodes of deep venous thrombosis ([place]) 09/17/2018  . IBS (irritable bowel syndrome) 09/17/2018  . Healthcare maintenance 09/17/2018  . Primary osteoarthritis of right hip 09/03/2018  .  Presence of right hip implant 09/03/2018  . Morbid obesity with BMI of 45.0-49.9, adult ([place]) 08/23/2018  . Shortness of breath 08/22/2018  . History of ductal carcinoma in situ (DCIS) of breast 07/20/2018  . Aromatase inhibitor use 07/20/2018  . Abnormal findings on auscultation 07/16/2018  . Encounter for long-term (current) use of high-risk medication 07/16/2018  . Fibromyalgia 07/16/2018  . Other forms of systemic lupus erythematosus ([place]) 07/16/2018  . Other osteoporosis without current pathological fracture 07/16/2018  . Seronegative arthritis 07/16/2018   Aarik Blank B. Quentin Ore [facility], CCC-SLP Speech Language Pathologist  Shonna Chock 05/10/2020, 3:16 PM  [place] MAIN [facility] SERVICES [address] [place], [place], [address] Phone: [phone]   Fax:  [phone]   Name: Victoria [name] MRN: [id number] Date of Birth: 1960-08-17

## 2020-05-12 ENCOUNTER — Ambulatory Visit: Payer: Medicare Other | Admitting: Speech Pathology

## 2020-05-12 ENCOUNTER — Encounter: Payer: Self-pay | Admitting: Speech Pathology

## 2020-05-12 ENCOUNTER — Other Ambulatory Visit: Payer: Self-pay

## 2020-05-12 DIAGNOSIS — R490 Dysphonia: Secondary | ICD-10-CM | POA: Diagnosis not present

## 2020-05-12 NOTE — Therapy (Signed)
[place] MAIN [facility] SERVICES [address] [place], [place], [address] Phone: [phone]   Fax:  [phone]  Speech Language Pathology Treatment  Patient Details  Name: Victoria [name] MRN: [id number] Date of Birth: 06-25-1961 Referring Provider (SLP): Dr. Pryor Ochoa   Encounter Date: 05/12/2020   End of Session - 05/12/20 1329    Visit Number 8    Number of Visits 17    Date for SLP Re-Evaluation 06/03/20    Authorization Type Medicare    Authorization Time Period Start 04/07/2020    Authorization - Visit Number 7    Progress Note Due on Visit 10    SLP Start Time 1105    SLP Stop Time  1150    SLP Time Calculation (min) 45 min    Activity Tolerance Patient tolerated treatment well           Past Medical History:  Diagnosis Date  . Allergy   . Asthma   . Cancer ([place])    Breast Left, DCIS 2018  . Collagen vascular disease (HCC)    Lupus  . COPD (chronic obstructive pulmonary disease) ([place])   . Diverticulitis   . Fibromyalgia   . GERD (gastroesophageal reflux disease)   . Hearing impaired person, left   . IBS (irritable bowel syndrome)   . Lupus ([place])   . Mitral valve disorder   . MS (multiple sclerosis) ([place])   . Neuromuscular disorder ([place])   . Osteoarthritis   . Osteoporosis 05/12/2019  . Personal history of radiation therapy 2017   left   . Rheumatoid arthritis ([place])   . Seizures ([place])     Past Surgical History:  Procedure Laterality Date  . ABDOMINAL HYSTERECTOMY    . APPENDECTOMY    . BREAST EXCISIONAL BIOPSY Left 2017   lumpectomy with radation  . BREAST EXCISIONAL BIOPSY Left 2004   neg surgical bx   . BREAST LUMPECTOMY Left 2017   DCIS  . BREAST SURGERY    . CHOLECYSTECTOMY    . COLONOSCOPY WITH PROPOFOL N/A 07/07/2019   Procedure: COLONOSCOPY WITH PROPOFOL;  Surgeon: Toledo, Benay Pike, MD;  Location: [facility];  Service: Gastroenterology;  Laterality: N/A;  . ESOPHAGOGASTRODUODENOSCOPY (EGD) WITH  PROPOFOL N/A 07/07/2019   Procedure: ESOPHAGOGASTRODUODENOSCOPY (EGD) WITH PROPOFOL;  Surgeon: Toledo, Benay Pike, MD;  Location: [facility];  Service: Gastroenterology;  Laterality: N/A;  . PATENT DUCTUS ARTERIOUS REPAIR    . RECONSTRUCTION TENDON PULLEY HAND Left   . TOTAL HIP ARTHROPLASTY Right     There were no vitals filed for this visit.   Subjective Assessment - 05/12/20 1325    Subjective I am drinking a little more water    Currently in Pain? 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       Treatment Provided   Treatment provided Cognitive-Linquistic      Cognitive-Linquistic Treatment   Treatment focused on Voice;Patient/family/caregiver education    Skilled Treatment Pt was seen for skilled ST intervention targeting goals for improved vocal hygiene and quality. Pt completed abdominal breathing exercises and neck/laryngeal muscle stretches for warm up. SLP reviewed  and practiced vocal straw exercises with pt, and explained rationale for this exercise. Reviewed and practiced Resonant Voice exercises to decreased vocal tension. Pt verbalizes awareness of intermittent "growly" sound in her voice and worked to correct it on the next trial. Pt was encouraged to continue to increase water consumption.      Assessment / Recommendations / Plan   Plan Continue with current plan of care      Progression Toward Goals   Progression toward goals Progressing toward goals            SLP Education - 05/12/20 1328    Education Details Reviewed vocal straw exercises and resonant voice strategies    Person(s) Educated Patient    Methods Explanation;Demonstration;Verbal cues;Handout    Comprehension Verbalized  understanding;Returned demonstration;Need further instruction              SLP Long Term Goals - 04/08/20 1507      SLP LONG TERM GOAL #1   Title The patient will demonstrate independent understanding of vocal hygiene concepts and extrinsic laryngeal muscle stretches.    Time 8    Period Weeks    Status New    Target Date 06/03/20      SLP LONG TERM GOAL #2   Title The patient will be independent for abdominal breathing and breath support exercises.    Time 8    Period Weeks    Status New    Target Date 06/03/20      SLP LONG TERM GOAL #3   Title The patient will minimize vocal tension via resonant voice therapy (or comparable technique) with min SLP cues with 80% accuracy.    Time 8    Period Weeks    Status New    Target Date 06/03/20      SLP LONG TERM GOAL #4   Title The patient will maintain relaxed phonation / oral resonance for paragraph length recitation with 80% accuracy.    Time 8    Period Weeks    Status New    Target Date 06/03/20            Plan - 05/12/20 1330    Clinical Impression Statement Pt continues to be cooperative and participatory in treatment, and verbalizes willingness to practice strategies/ exercises at home. Continued ST intervention is recommended to maximize vocal quality.    Speech Therapy Frequency 2x / week    Duration 8 weeks    Treatment/Interventions SLP instruction and feedback;Patient/family education    Potential to Achieve Goals Good    Potential Considerations Ability to learn/carryover information;Previous level of function;Co-morbidities;Severity of impairments;Cooperation/participation level;Medical prognosis;Family/community support;Pain level    SLP Home Exercise Plan Provided    Consulted and Agree with Plan of Care Patient           Patient will benefit from skilled therapeutic intervention in order to improve the following deficits and impairments:   Voice hoarseness    Problem List Patient Active Problem  List   Diagnosis Date Noted  . Vitamin D deficiency 08/22/2019  . History of thrombosis 08/22/2019  . Chronic fatigue 07/17/2019  . Osteoporosis 05/12/2019  . STEC (Shiga toxin-producing Escherichia coli) infection 04/06/2019  . Diverticulitis 04/06/2019  . Thrombocytopenia ([place]) 01/23/2019  . Leukopenia 01/23/2019  . Lymphedema of both lower extremities 01/16/2019  . Polyarthralgia 10/14/2018  . Headache disorder 10/14/2018  . Chronic daily headache 10/11/2018  . COPD (chronic obstructive pulmonary disease) ([place]) 10/11/2018  . Thoracic aortic atherosclerosis ([place]) 10/11/2018  .  Multiple episodes of deep venous thrombosis ([place]) 09/17/2018  . IBS (irritable bowel syndrome) 09/17/2018  . Healthcare maintenance 09/17/2018  . Primary osteoarthritis of right hip 09/03/2018  . Presence of right hip implant 09/03/2018  . Morbid obesity with BMI of 45.0-49.9, adult ([place]) 08/23/2018  . Shortness of breath 08/22/2018  . History of ductal carcinoma in situ (DCIS) of breast 07/20/2018  . Aromatase inhibitor use 07/20/2018  . Abnormal findings on auscultation 07/16/2018  . Encounter for long-term (current) use of high-risk medication 07/16/2018  . Fibromyalgia 07/16/2018  . Other forms of systemic lupus erythematosus ([place]) 07/16/2018  . Other osteoporosis without current pathological fracture 07/16/2018  . Seronegative arthritis 07/16/2018   Nilah Belcourt B. Quentin Ore, [facility], CCC-SLP Speech Language Pathologist  Shonna Chock 05/12/2020, 1:32 PM  [place] MAIN [facility] SERVICES [address] [place], [place], [address] Phone: [phone]   Fax:  [phone]   Name: Victoria [name] MRN: [id number] Date of Birth: [birth year]

## 2020-05-18 ENCOUNTER — Ambulatory Visit: Payer: Medicare Other | Admitting: Speech Pathology

## 2020-05-18 ENCOUNTER — Other Ambulatory Visit: Payer: Self-pay

## 2020-05-18 DIAGNOSIS — R490 Dysphonia: Secondary | ICD-10-CM | POA: Diagnosis not present

## 2020-05-19 ENCOUNTER — Encounter: Payer: Self-pay | Admitting: Speech Pathology

## 2020-05-19 NOTE — Therapy (Signed)
[place] MAIN [facility] SERVICES [address] [place], [place], [address] Phone: [phone]   Fax:  [phone]  Speech Language Pathology Treatment  Patient Details  Name: Victoria [name] MRN: [id number] Date of Birth: 01/27/1961 Referring Provider (SLP): Dr. Pryor Ochoa   Encounter Date: 05/18/2020   End of Session - 05/19/20 1322    Visit Number 9    Number of Visits 17    Date for SLP Re-Evaluation 06/03/20    Authorization Type Medicare    Authorization Time Period Start 04/07/2020    Authorization - Visit Number 8    Progress Note Due on Visit 10    SLP Start Time 1430    SLP Stop Time  1520    SLP Time Calculation (min) 50 min    Activity Tolerance Patient tolerated treatment well           Past Medical History:  Diagnosis Date  . Allergy   . Asthma   . Cancer ([place])    Breast Left, DCIS 2018  . Collagen vascular disease (HCC)    Lupus  . COPD (chronic obstructive pulmonary disease) ([place])   . Diverticulitis   . Fibromyalgia   . GERD (gastroesophageal reflux disease)   . Hearing impaired person, left   . IBS (irritable bowel syndrome)   . Lupus ([place])   . Mitral valve disorder   . MS (multiple sclerosis) ([place])   . Neuromuscular disorder ([place])   . Osteoarthritis   . Osteoporosis 05/12/2019  . Personal history of radiation therapy 2017   left   . Rheumatoid arthritis ([place])   . Seizures ([place])     Past Surgical History:  Procedure Laterality Date  . ABDOMINAL HYSTERECTOMY    . APPENDECTOMY    . BREAST EXCISIONAL BIOPSY Left 2017   lumpectomy with radation  . BREAST EXCISIONAL BIOPSY Left 2004   neg surgical bx   . BREAST LUMPECTOMY Left 2017   DCIS  . BREAST SURGERY    . CHOLECYSTECTOMY    . COLONOSCOPY WITH PROPOFOL N/A 07/07/2019   Procedure: COLONOSCOPY WITH PROPOFOL;  Surgeon: Toledo, Benay Pike, MD;  Location: [facility];  Service: Gastroenterology;  Laterality: N/A;  . ESOPHAGOGASTRODUODENOSCOPY (EGD) WITH  PROPOFOL N/A 07/07/2019   Procedure: ESOPHAGOGASTRODUODENOSCOPY (EGD) WITH PROPOFOL;  Surgeon: Toledo, Benay Pike, MD;  Location: [facility];  Service: Gastroenterology;  Laterality: N/A;  . PATENT DUCTUS ARTERIOUS REPAIR    . RECONSTRUCTION TENDON PULLEY HAND Left   . TOTAL HIP ARTHROPLASTY Right     There were no vitals filed for this visit.   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       Treatment Provided   Treatment provided Cognitive-Linquistic      Pain Assessment   Pain Assessment 0-10    Pain Score --   Chronic pain     Cognitive-Linquistic Treatment   Treatment focused on Voice;Patient/family/caregiver education    Skilled Treatment The patient was provided with written and verbal teaching regarding vocal hygiene.  The patient was provided with written and verbal teaching regarding breath support exercises.  The patient was provided with verbal and written Flow  phonation therapy: Skill Level One: establish airflow release: Unarticulated: greatest success with sustained fricative, demonstrating longer duration of unimpeded airflow (up to 15 seconds).  Added changes in airflow velocity with sustained fricatives.   Phonation: Patient able to generate sustained unarticulated phonation with good vocal quality.  Patient reads words, phrases, and sentences with good vocal quality with 75% accuracy.  Generate short sentences with good quality voice with 65% accuracy.  Good vocal quality in off the cuff remarks with 50% accuracy.      Assessment / Recommendations / Plan   Plan Continue with current plan of care      Progression Toward Goals   Progression toward goals Progressing toward goals              SLP Education - 05/19/20 1321    Education Details Current level of pain, Lupus status, and respiratory infections will have an effect on vocal quality    Person(s) Educated Patient    Methods Explanation    Comprehension Verbalized understanding              SLP Long Term Goals - 04/08/20 1507      SLP LONG TERM GOAL #1   Title The patient will demonstrate independent understanding of vocal hygiene concepts and extrinsic laryngeal muscle stretches.    Time 8    Period Weeks    Status New    Target Date 06/03/20      SLP LONG TERM GOAL #2   Title The patient will be independent for abdominal breathing and breath support exercises.    Time 8    Period Weeks    Status New    Target Date 06/03/20      SLP LONG TERM GOAL #3   Title The patient will minimize vocal tension via resonant voice therapy (or comparable technique) with min SLP cues with 80% accuracy.    Time 8    Period Weeks    Status New    Target Date 06/03/20      SLP LONG TERM GOAL #4   Title The patient will maintain relaxed phonation / oral resonance for paragraph length recitation with 80% accuracy.    Time 8    Period Weeks    Status New    Target Date 06/03/20            Plan - 05/19/20 1324    Clinical Impression Statement The patient is able to generate clear vocal quality with flow phonation technique.  She is beginning to generalize improved vocal quality to conversational speech.  She states that her Lupus is "flaring up" which makes achieving better vocal quality more difficult.    Speech Therapy Frequency 2x / week    Duration 8 weeks    Treatment/Interventions SLP instruction and feedback;Patient/family education    Potential to Achieve Goals Good    Potential Considerations Ability to learn/carryover information;Previous level of function;Co-morbidities;Severity of impairments;Cooperation/participation level;Medical prognosis;Family/community support;Pain level    SLP Home  Exercise Plan Provided    Consulted and Agree with Plan of Care Patient           Patient will benefit from skilled therapeutic intervention in order to improve the following deficits and impairments:   Dysphonia    Problem List Patient Active Problem List   Diagnosis Date Noted  . Vitamin D deficiency 08/22/2019  . History of thrombosis 08/22/2019  . Chronic fatigue 07/17/2019  . Osteoporosis 05/12/2019  . STEC (Shiga toxin-producing Escherichia coli) infection 04/06/2019  .  Diverticulitis 04/06/2019  . Thrombocytopenia ([place]) 01/23/2019  . Leukopenia 01/23/2019  . Lymphedema of both lower extremities 01/16/2019  . Polyarthralgia 10/14/2018  . Headache disorder 10/14/2018  . Chronic daily headache 10/11/2018  . COPD (chronic obstructive pulmonary disease) ([place]) 10/11/2018  . Thoracic aortic atherosclerosis ([place]) 10/11/2018  . Multiple episodes of deep venous thrombosis ([place]) 09/17/2018  . IBS (irritable bowel syndrome) 09/17/2018  . Healthcare maintenance 09/17/2018  . Primary osteoarthritis of right hip 09/03/2018  . Presence of right hip implant 09/03/2018  . Morbid obesity with BMI of 45.0-49.9, adult ([place]) 08/23/2018  . Shortness of breath 08/22/2018  . History of ductal carcinoma in situ (DCIS) of breast 07/20/2018  . Aromatase inhibitor use 07/20/2018  . Abnormal findings on auscultation 07/16/2018  . Encounter for long-term (current) use of high-risk medication 07/16/2018  . Fibromyalgia 07/16/2018  . Other forms of systemic lupus erythematosus ([place]) 07/16/2018  . Other osteoporosis without current pathological fracture 07/16/2018  . Seronegative arthritis 07/16/2018   Leroy Sea, MS/CCC- SLP  Victoria [name] 05/19/2020, 1:25 PM  [place] MAIN [facility] SERVICES [address] [place], [place], [address] Phone: [phone]   Fax:  [phone]   Name: Victoria [name] MRN: [id number] Date of Birth: [birth year]

## 2020-05-24 ENCOUNTER — Ambulatory Visit: Payer: Medicare Other | Admitting: Speech Pathology

## 2020-05-24 ENCOUNTER — Encounter: Payer: Self-pay | Admitting: Speech Pathology

## 2020-05-24 ENCOUNTER — Other Ambulatory Visit: Payer: Self-pay

## 2020-05-24 DIAGNOSIS — R490 Dysphonia: Secondary | ICD-10-CM | POA: Diagnosis not present

## 2020-05-24 NOTE — Therapy (Signed)
[place] MAIN [facility] SERVICES [address] [place], [place], [address] Phone: [phone]   Fax:  [phone]  Speech Language Pathology Treatment  Speech Therapy Progress Note   Dates of reporting period  04/07/2020   to   05/24/2020   Patient Details  Name: Victoria [name] MRN: [id number] Date of Birth: [birth year] Referring Provider (SLP): Dr. Pryor Ochoa   Encounter Date: 05/24/2020   End of Session - 05/24/20 1506    Visit Number 10    Number of Visits 17    Date for SLP Re-Evaluation 06/03/20    Authorization Type Medicare    Authorization Time Period Start 04/07/2020    Authorization - Visit Number 10    Progress Note Due on Visit 10    SLP Start Time 1400    SLP Stop Time  1450    SLP Time Calculation (min) 50 min    Activity Tolerance Patient tolerated treatment well           Past Medical History:  Diagnosis Date  . Allergy   . Asthma   . Cancer ([place])    Breast Left, DCIS 2018  . Collagen vascular disease (HCC)    Lupus  . COPD (chronic obstructive pulmonary disease) ([place])   . Diverticulitis   . Fibromyalgia   . GERD (gastroesophageal reflux disease)   . Hearing impaired person, left   . IBS (irritable bowel syndrome)   . Lupus ([place])   . Mitral valve disorder   . MS (multiple sclerosis) ([place])   . Neuromuscular disorder ([place])   . Osteoarthritis   . Osteoporosis 05/12/2019  . Personal history of radiation therapy 2017   left   . Rheumatoid arthritis ([place])   . Seizures ([place])     Past Surgical History:  Procedure Laterality Date  . ABDOMINAL HYSTERECTOMY    . APPENDECTOMY    . BREAST EXCISIONAL BIOPSY Left 2017   lumpectomy with radation  . BREAST EXCISIONAL BIOPSY Left 2004   neg surgical bx   . BREAST LUMPECTOMY Left 2017   DCIS  . BREAST SURGERY    . CHOLECYSTECTOMY    . COLONOSCOPY WITH PROPOFOL N/A 07/07/2019   Procedure: COLONOSCOPY WITH PROPOFOL;  Surgeon: Toledo, Benay Pike, MD;  Location: [facility]  ENDOSCOPY;  Service: Gastroenterology;  Laterality: N/A;  . ESOPHAGOGASTRODUODENOSCOPY (EGD) WITH PROPOFOL N/A 07/07/2019   Procedure: ESOPHAGOGASTRODUODENOSCOPY (EGD) WITH PROPOFOL;  Surgeon: Toledo, Benay Pike, MD;  Location: [facility] ENDOSCOPY;  Service: Gastroenterology;  Laterality: N/A;  . PATENT DUCTUS ARTERIOUS REPAIR    . RECONSTRUCTION TENDON PULLEY HAND Left   . TOTAL HIP ARTHROPLASTY Right     There were no vitals filed for this visit.   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       Pain Assessment   Pain Score --   Chronic pain     Cognitive-Linquistic Treatment   Treatment focused on Voice;Patient/family/caregiver education    Skilled Treatment The patient was provided with written and verbal teaching regarding vocal hygiene.  The patient was provided with written and verbal teaching regarding breath support exercises.  The patient was provided with  verbal and written Flow phonation therapy: Skill Level One: establish airflow release: Unarticulated: greatest success with sustained fricative, demonstrating longer duration of unimpeded airflow (up to 15 seconds).  Added changes in airflow velocity with sustained fricatives.   Phonation: Patient able to generate sustained unarticulated phonation with good vocal quality.  Patient reads words, phrases, and sentences with good vocal quality with 75% accuracy.  Generate short sentences with good quality voice with 65% accuracy.  Good vocal quality in off the cuff remarks with 50% accuracy.      Assessment / Recommendations / Plan   Plan Continue with current plan of care      Progression Toward Goals   Progression toward goals Progressing  toward goals            SLP Education - 05/24/20 1505    Education Details Take responsibility for maintaining clear vocal quality    Person(s) Educated Patient    Methods Explanation    Comprehension Verbalized understanding              SLP Long Term Goals - 05/24/20 1508      SLP LONG TERM GOAL #1   Title The patient will demonstrate independent understanding of vocal hygiene concepts and extrinsic laryngeal muscle stretches.    Status Partially Met    Target Date 06/03/20      SLP LONG TERM GOAL #2   Title The patient will be independent for abdominal breathing and breath support exercises.    Status Achieved      SLP LONG TERM GOAL #3   Title The patient will minimize vocal tension via resonant voice therapy (or comparable technique) with min SLP cues with 80% accuracy.    Status Achieved      SLP LONG TERM GOAL #4   Title The patient will maintain relaxed phonation / oral resonance for paragraph length recitation with 80% accuracy.    Status Partially Met    Target Date 06/03/20            Plan - 05/24/20 1507    Clinical Impression Statement The patient is able to generate clear vocal quality with flow phonation technique.  She demonstrates improved generalization of improved vocal quality to conversational speech.    Speech Therapy Frequency 2x / week    Duration 8 weeks    Treatment/Interventions SLP instruction and feedback;Patient/family education    Potential to Achieve Goals Good    Consulted and Agree with Plan of Care Patient           Patient will benefit from skilled therapeutic intervention in order to improve the following deficits and impairments:   Dysphonia    Problem List Patient Active Problem List   Diagnosis Date Noted  . Vitamin D deficiency 08/22/2019  . History of thrombosis 08/22/2019  . Chronic fatigue 07/17/2019  . Osteoporosis 05/12/2019  . STEC (Shiga toxin-producing Escherichia coli) infection 04/06/2019  .  Diverticulitis 04/06/2019  . Thrombocytopenia ([place]) 01/23/2019  . Leukopenia 01/23/2019  . Lymphedema of both lower extremities 01/16/2019  . Polyarthralgia 10/14/2018  . Headache disorder 10/14/2018  . Chronic daily headache 10/11/2018  . COPD (chronic obstructive pulmonary disease) ([place]) 10/11/2018  . Thoracic aortic atherosclerosis ([place]) 10/11/2018  . Multiple episodes of deep venous thrombosis ([place]) 09/17/2018  . IBS (irritable bowel syndrome) 09/17/2018  . Healthcare maintenance 09/17/2018  . Primary osteoarthritis of right hip 09/03/2018  . Presence of right hip implant 09/03/2018  . Morbid obesity with BMI of 45.0-49.9, adult (  [place]) 08/23/2018  . Shortness of breath 08/22/2018  . History of ductal carcinoma in situ (DCIS) of breast 07/20/2018  . Aromatase inhibitor use 07/20/2018  . Abnormal findings on auscultation 07/16/2018  . Encounter for long-term (current) use of high-risk medication 07/16/2018  . Fibromyalgia 07/16/2018  . Other forms of systemic lupus erythematosus ([place]) 07/16/2018  . Other osteoporosis without current pathological fracture 07/16/2018  . Seronegative arthritis 07/16/2018   Leroy Sea, MS/CCC- SLP  Lou Miner 05/24/2020, 3:09 PM  [place] MAIN [facility] SERVICES [address] [place], [place], [address] Phone: [phone]   Fax:  [phone]   Name: Brianca Fortenberry MRN: [id number] Date of Birth: 10/17/1960

## 2020-05-26 ENCOUNTER — Ambulatory Visit: Payer: Medicare Other | Admitting: Speech Pathology

## 2020-05-31 ENCOUNTER — Ambulatory Visit: Payer: Medicare Other | Admitting: Speech Pathology

## 2020-05-31 ENCOUNTER — Other Ambulatory Visit: Payer: Self-pay

## 2020-05-31 DIAGNOSIS — R490 Dysphonia: Secondary | ICD-10-CM | POA: Diagnosis not present

## 2020-06-01 ENCOUNTER — Encounter: Payer: Self-pay | Admitting: Speech Pathology

## 2020-06-01 NOTE — Therapy (Signed)
[place] MAIN [facility] SERVICES [address] [place], [place], [address] Phone: [phone]   Fax:  [phone]  Speech Language Pathology Treatment  Patient Details  Name: Victoria [name] MRN: [id number] Date of Birth: November 22, 1960 Referring Provider (SLP): Dr. Pryor Ochoa   Encounter Date: 05/31/2020   End of Session - 06/01/20 1004    Visit Number 11    Number of Visits 17    Date for SLP Re-Evaluation 06/03/20    Authorization Type Medicare    Authorization Time Period 05/31/2020    Authorization - Visit Number 1    Progress Note Due on Visit 10    SLP Start Time 1500    SLP Stop Time  1545    SLP Time Calculation (min) 45 min    Activity Tolerance Patient tolerated treatment well           Past Medical History:  Diagnosis Date  . Allergy   . Asthma   . Cancer ([place])    Breast Left, DCIS 2018  . Collagen vascular disease (HCC)    Lupus  . COPD (chronic obstructive pulmonary disease) ([place])   . Diverticulitis   . Fibromyalgia   . GERD (gastroesophageal reflux disease)   . Hearing impaired person, left   . IBS (irritable bowel syndrome)   . Lupus ([place])   . Mitral valve disorder   . MS (multiple sclerosis) ([place])   . Neuromuscular disorder ([place])   . Osteoarthritis   . Osteoporosis 05/12/2019  . Personal history of radiation therapy 2017   left   . Rheumatoid arthritis ([place])   . Seizures ([place])     Past Surgical History:  Procedure Laterality Date  . ABDOMINAL HYSTERECTOMY    . APPENDECTOMY    . BREAST EXCISIONAL BIOPSY Left 2017   lumpectomy with radation  . BREAST EXCISIONAL BIOPSY Left 2004   neg surgical bx   . BREAST LUMPECTOMY Left 2017   DCIS  . BREAST SURGERY    . CHOLECYSTECTOMY    . COLONOSCOPY WITH PROPOFOL N/A 07/07/2019   Procedure: COLONOSCOPY WITH PROPOFOL;  Surgeon: Toledo, Benay Pike, MD;  Location: [facility] ENDOSCOPY;  Service: Gastroenterology;  Laterality: N/A;  . ESOPHAGOGASTRODUODENOSCOPY (EGD) WITH  PROPOFOL N/A 07/07/2019   Procedure: ESOPHAGOGASTRODUODENOSCOPY (EGD) WITH PROPOFOL;  Surgeon: Toledo, Benay Pike, MD;  Location: [facility] ENDOSCOPY;  Service: Gastroenterology;  Laterality: N/A;  . PATENT DUCTUS ARTERIOUS REPAIR    . RECONSTRUCTION TENDON PULLEY HAND Left   . TOTAL HIP ARTHROPLASTY Right     There were no vitals filed for this visit.   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       Treatment Provided   Treatment provided Cognitive-Linquistic      Pain Assessment   Pain Score --   chronic pain     Cognitive-Linquistic Treatment   Treatment focused on Voice;Patient/family/caregiver education    Skilled Treatment The patient was provided with written and verbal teaching regarding vocal hygiene.  The patient was provided with written and verbal teaching regarding breath support exercises.  The patient was provided with verbal and written Flow phonation therapy: Skill Level One:  establish airflow release: Unarticulated: greatest success with sustained fricative, demonstrating longer duration of unimpeded airflow (up to 15 seconds).  Added changes in airflow velocity with sustained fricatives.   Phonation: Patient able to generate sustained unarticulated phonation with good vocal quality.  Patient reads words, phrases, and sentences with good vocal quality with 85% accuracy.  Generate short sentences with good quality voice with 80% accuracy.  Good vocal quality in off the cuff remarks with 70% accuracy.      Assessment / Recommendations / Plan   Plan Continue with current plan of care      Progression Toward Goals   Progression toward goals Progressing toward goals             SLP Education - 06/01/20 1002    Education Details Take responsibility for maintaining clear vocal quality    Person(s) Educated Patient    Methods Explanation    Comprehension Verbalized understanding              SLP Long Term Goals - 05/24/20 1508      SLP LONG TERM GOAL #1   Title The patient will demonstrate independent understanding of vocal hygiene concepts and extrinsic laryngeal muscle stretches.    Status Partially Met    Target Date 06/03/20      SLP LONG TERM GOAL #2   Title The patient will be independent for abdominal breathing and breath support exercises.    Status Achieved      SLP LONG TERM GOAL #3   Title The patient will minimize vocal tension via resonant voice therapy (or comparable technique) with min SLP cues with 80% accuracy.    Status Achieved      SLP LONG TERM GOAL #4   Title The patient will maintain relaxed phonation / oral resonance for paragraph length recitation with 80% accuracy.    Status Partially Met    Target Date 06/03/20            Plan - 06/01/20 1005    Clinical Impression Statement The patient is able to generate clear vocal quality with flow phonation technique.  She demonstrates improved generalization of improved vocal quality to conversational speech.    Speech Therapy Frequency 2x / week    Duration 8 weeks    Treatment/Interventions SLP instruction and feedback;Patient/family education    Potential to Achieve Goals Good    Potential Considerations Ability to learn/carryover information;Previous level of function;Co-morbidities;Severity of impairments;Cooperation/participation level;Medical prognosis;Family/community support;Pain level    SLP Home Exercise Plan Provided    Consulted and Agree with Plan of Care Patient           Patient will benefit from skilled therapeutic intervention in order to improve the following deficits and impairments:   Dysphonia    Problem List Patient Active Problem List   Diagnosis  Date Noted  . Vitamin D deficiency 08/22/2019  . History of thrombosis 08/22/2019  . Chronic fatigue 07/17/2019  . Osteoporosis 05/12/2019  . STEC (Shiga toxin-producing Escherichia coli) infection 04/06/2019  . Diverticulitis 04/06/2019  . Thrombocytopenia ([place]) 01/23/2019  . Leukopenia 01/23/2019  . Lymphedema of both lower extremities 01/16/2019  . Polyarthralgia 10/14/2018  . Headache disorder 10/14/2018  . Chronic daily headache 10/11/2018  . COPD (chronic obstructive pulmonary disease) ([place]) 10/11/2018  . Thoracic aortic atherosclerosis ([place]) 10/11/2018  . Multiple episodes of deep venous thrombosis ([place]) 09/17/2018  . IBS (irritable bowel syndrome) 09/17/2018  . Healthcare maintenance 09/17/2018  . Primary osteoarthritis of right hip 09/03/2018  .  Presence of right hip implant 09/03/2018  . Morbid obesity with BMI of 45.0-49.9, adult ([place]) 08/23/2018  . Shortness of breath 08/22/2018  . History of ductal carcinoma in situ (DCIS) of breast 07/20/2018  . Aromatase inhibitor use 07/20/2018  . Abnormal findings on auscultation 07/16/2018  . Encounter for long-term (current) use of high-risk medication 07/16/2018  . Fibromyalgia 07/16/2018  . Other forms of systemic lupus erythematosus ([place]) 07/16/2018  . Other osteoporosis without current pathological fracture 07/16/2018  . Seronegative arthritis 07/16/2018   Leroy Sea, MS/CCC- SLP  Lou Miner 06/01/2020, 10:06 AM  [place] MAIN [facility] SERVICES [address] [place], [place], [address] Phone: [phone]   Fax:  [phone]   Name: Victoria [name] MRN: [id number] Date of Birth: [birth year]

## 2020-06-02 ENCOUNTER — Encounter: Payer: Self-pay | Admitting: Speech Pathology

## 2020-06-02 ENCOUNTER — Other Ambulatory Visit: Payer: Self-pay

## 2020-06-02 ENCOUNTER — Ambulatory Visit: Payer: Medicare Other | Admitting: Speech Pathology

## 2020-06-02 DIAGNOSIS — R490 Dysphonia: Secondary | ICD-10-CM | POA: Diagnosis not present

## 2020-06-02 NOTE — Therapy (Signed)
[place] MAIN [facility] SERVICES [address] [place], [place], [address] Phone: [phone]   Fax:  [phone]  Speech Language Pathology Treatment  Patient Details  Name: Victoria [name] MRN: [id number] Date of Birth: 05/13/1961 Referring Provider (SLP): Dr. Pryor Ochoa   Encounter Date: 06/02/2020   End of Session - 06/02/20 1536    Visit Number 12    Number of Visits 17    Date for SLP Re-Evaluation 06/03/20    Authorization Type Medicare    Authorization Time Period 05/31/2020    Authorization - Visit Number 2    Progress Note Due on Visit 10    SLP Start Time 1400    SLP Stop Time  1445    SLP Time Calculation (min) 45 min    Activity Tolerance Patient tolerated treatment well           Past Medical History:  Diagnosis Date  . Allergy   . Asthma   . Cancer ([place])    Breast Left, DCIS 2018  . Collagen vascular disease (HCC)    Lupus  . COPD (chronic obstructive pulmonary disease) ([place])   . Diverticulitis   . Fibromyalgia   . GERD (gastroesophageal reflux disease)   . Hearing impaired person, left   . IBS (irritable bowel syndrome)   . Lupus ([place])   . Mitral valve disorder   . MS (multiple sclerosis) ([place])   . Neuromuscular disorder ([place])   . Osteoarthritis   . Osteoporosis 05/12/2019  . Personal history of radiation therapy 2017   left   . Rheumatoid arthritis ([place])   . Seizures ([place])     Past Surgical History:  Procedure Laterality Date  . ABDOMINAL HYSTERECTOMY    . APPENDECTOMY    . BREAST EXCISIONAL BIOPSY Left 2017   lumpectomy with radation  . BREAST EXCISIONAL BIOPSY Left 2004   neg surgical bx   . BREAST LUMPECTOMY Left 2017   DCIS  . BREAST SURGERY    . CHOLECYSTECTOMY    . COLONOSCOPY WITH PROPOFOL N/A 07/07/2019   Procedure: COLONOSCOPY WITH PROPOFOL;  Surgeon: Toledo, Benay Pike, MD;  Location: [facility] ENDOSCOPY;  Service: Gastroenterology;  Laterality: N/A;  . ESOPHAGOGASTRODUODENOSCOPY (EGD) WITH  PROPOFOL N/A 07/07/2019   Procedure: ESOPHAGOGASTRODUODENOSCOPY (EGD) WITH PROPOFOL;  Surgeon: Toledo, Benay Pike, MD;  Location: [facility] ENDOSCOPY;  Service: Gastroenterology;  Laterality: N/A;  . PATENT DUCTUS ARTERIOUS REPAIR    . RECONSTRUCTION TENDON PULLEY HAND Left   . TOTAL HIP ARTHROPLASTY Right     There were no vitals filed for this visit.   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       Treatment Provided   Treatment provided Cognitive-Linquistic      Pain Assessment   Pain Score --   Chronic pain     Cognitive-Linquistic Treatment   Treatment focused on Voice;Patient/family/caregiver education    Skilled Treatment The patient was provided with written and verbal teaching regarding vocal hygiene.  The patient was provided with written and verbal teaching regarding breath support exercises.  The patient was provided with verbal and written Flow phonation therapy: Skill Level One:  establish airflow release: Unarticulated: greatest success with sustained fricative, demonstrating longer duration of unimpeded airflow (up to 15 seconds).  Added changes in airflow velocity with sustained fricatives.   Phonation: Patient able to generate sustained unarticulated phonation with good vocal quality.  Patient reads words, phrases, sentences, and short paragrphs with good vocal quality with 85% accuracy.  Generate short sentences with good quality voice with 75% accuracy.  Good vocal quality in off the cuff remarks with 65% accuracy.      Assessment / Recommendations / Plan   Plan Continue with current plan of care      Progression Toward Goals   Progression toward goals Progressing toward goals              SLP Education - 06/02/20 1534    Education Details Take responsibility for maintaining clear vocal quality    Person(s) Educated Patient    Methods Explanation    Comprehension Verbalized understanding              SLP Long Term Goals - 05/24/20 1508      SLP LONG TERM GOAL #1   Title The patient will demonstrate independent understanding of vocal hygiene concepts and extrinsic laryngeal muscle stretches.    Status Partially Met    Target Date 06/03/20      SLP LONG TERM GOAL #2   Title The patient will be independent for abdominal breathing and breath support exercises.    Status Achieved      SLP LONG TERM GOAL #3   Title The patient will minimize vocal tension via resonant voice therapy (or comparable technique) with min SLP cues with 80% accuracy.    Status Achieved      SLP LONG TERM GOAL #4   Title The patient will maintain relaxed phonation / oral resonance for paragraph length recitation with 80% accuracy.    Status Partially Met    Target Date 06/03/20            Plan - 06/02/20 1537    Clinical Impression Statement The patient is able to generate clear vocal quality with flow phonation technique.  She demonstrates improved endurance for maintaining clear vocal quality and generalization of improved vocal quality to conversational speech.    Speech Therapy Frequency 2x / week    Duration 8 weeks    Treatment/Interventions SLP instruction and feedback;Patient/family education    Potential to Achieve Goals Good    Potential Considerations Ability to learn/carryover information;Previous level of function;Co-morbidities;Severity of impairments;Cooperation/participation level;Medical prognosis;Family/community support;Pain level    SLP Home Exercise Plan Provided    Consulted and Agree with Plan of Care Patient           Patient will benefit from skilled therapeutic intervention in order to improve the following deficits and impairments:    Dysphonia    Problem List Patient Active Problem List   Diagnosis Date Noted  . Vitamin D deficiency 08/22/2019  . History of thrombosis 08/22/2019  . Chronic fatigue 07/17/2019  . Osteoporosis 05/12/2019  . STEC (Shiga toxin-producing Escherichia coli) infection 04/06/2019  . Diverticulitis 04/06/2019  . Thrombocytopenia ([place]) 01/23/2019  . Leukopenia 01/23/2019  . Lymphedema of both lower extremities 01/16/2019  . Polyarthralgia 10/14/2018  . Headache disorder 10/14/2018  . Chronic daily headache 10/11/2018  . COPD (chronic obstructive pulmonary disease) ([place]) 10/11/2018  . Thoracic aortic atherosclerosis ([place]) 10/11/2018  . Multiple episodes of deep venous thrombosis ([place]) 09/17/2018  . IBS (irritable bowel syndrome) 09/17/2018  . Healthcare  maintenance 09/17/2018  . Primary osteoarthritis of right hip 09/03/2018  . Presence of right hip implant 09/03/2018  . Morbid obesity with BMI of 45.0-49.9, adult ([place]) 08/23/2018  . Shortness of breath 08/22/2018  . History of ductal carcinoma in situ (DCIS) of breast 07/20/2018  . Aromatase inhibitor use 07/20/2018  . Abnormal findings on auscultation 07/16/2018  . Encounter for long-term (current) use of high-risk medication 07/16/2018  . Fibromyalgia 07/16/2018  . Other forms of systemic lupus erythematosus ([place]) 07/16/2018  . Other osteoporosis without current pathological fracture 07/16/2018  . Seronegative arthritis 07/16/2018   Leroy Sea, MS/CCC- SLP  Lou Miner 06/02/2020, 3:38 PM  [place] MAIN [facility] SERVICES [address] [place], [place], [address] Phone: [phone]   Fax:  [phone]   Name: Victoria [name] MRN: [id number] Date of Birth: November 17, 1960

## 2020-06-08 ENCOUNTER — Inpatient Hospital Stay: Payer: Medicare Other

## 2020-06-09 ENCOUNTER — Inpatient Hospital Stay: Payer: Medicare Other | Attending: Oncology

## 2020-06-09 ENCOUNTER — Ambulatory Visit: Payer: Medicare Other | Admitting: Speech Pathology

## 2020-06-10 ENCOUNTER — Other Ambulatory Visit: Payer: Self-pay

## 2020-06-10 ENCOUNTER — Encounter: Payer: Self-pay | Admitting: Speech Pathology

## 2020-06-10 ENCOUNTER — Ambulatory Visit: Payer: Medicare Other | Attending: Otolaryngology | Admitting: Speech Pathology

## 2020-06-10 DIAGNOSIS — R490 Dysphonia: Secondary | ICD-10-CM | POA: Insufficient documentation

## 2020-06-10 NOTE — Therapy (Signed)
[place] MAIN [facility] SERVICES [address] [place], [place], [address] Phone: [phone]   Fax:  [phone]  Speech Language Pathology Treatment/Re-Certification  Patient Details  Name: Victoria [name] MRN: [id number] Date of Birth: 1961-08-06 Referring Provider (SLP): Dr. Pryor Ochoa   Encounter Date: 06/10/2020   End of Session - 06/10/20 1606    Visit Number 13    Number of Visits 21    Date for SLP Re-Evaluation 07/08/20    Authorization Type Medicare    Authorization Time Period 05/31/2020    Authorization - Visit Number 3    Progress Note Due on Visit 10    SLP Start Time 1500    SLP Stop Time  1545    SLP Time Calculation (min) 45 min    Activity Tolerance Patient tolerated treatment well           Past Medical History:  Diagnosis Date  . Allergy   . Asthma   . Cancer ([place])    Breast Left, DCIS 2018  . Collagen vascular disease (HCC)    Lupus  . COPD (chronic obstructive pulmonary disease) ([place])   . Diverticulitis   . Fibromyalgia   . GERD (gastroesophageal reflux disease)   . Hearing impaired person, left   . IBS (irritable bowel syndrome)   . Lupus ([place])   . Mitral valve disorder   . MS (multiple sclerosis) ([place])   . Neuromuscular disorder ([place])   . Osteoarthritis   . Osteoporosis 05/12/2019  . Personal history of radiation therapy 2017   left   . Rheumatoid arthritis ([place])   . Seizures ([place])     Past Surgical History:  Procedure Laterality Date  . ABDOMINAL HYSTERECTOMY    . APPENDECTOMY    . BREAST EXCISIONAL BIOPSY Left 2017   lumpectomy with radation  . BREAST EXCISIONAL BIOPSY Left 2004   neg surgical bx   . BREAST LUMPECTOMY Left 2017   DCIS  . BREAST SURGERY    . CHOLECYSTECTOMY    . COLONOSCOPY WITH PROPOFOL N/A 07/07/2019   Procedure: COLONOSCOPY WITH PROPOFOL;  Surgeon: Toledo, Benay Pike, MD;  Location: [facility] ENDOSCOPY;  Service: Gastroenterology;  Laterality: N/A;  . ESOPHAGOGASTRODUODENOSCOPY  (EGD) WITH PROPOFOL N/A 07/07/2019   Procedure: ESOPHAGOGASTRODUODENOSCOPY (EGD) WITH PROPOFOL;  Surgeon: Toledo, Benay Pike, MD;  Location: [facility] ENDOSCOPY;  Service: Gastroenterology;  Laterality: N/A;  . PATENT DUCTUS ARTERIOUS REPAIR    . RECONSTRUCTION TENDON PULLEY HAND Left   . TOTAL HIP ARTHROPLASTY Right     There were no vitals filed for this visit.   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       Treatment Provided   Treatment provided Cognitive-Linquistic      Pain Assessment   Pain Score --   Chronic pain     Cognitive-Linquistic Treatment   Treatment focused on Voice;Patient/family/caregiver education    Skilled Treatment The patient was provided with written and verbal teaching regarding vocal hygiene.  The patient was provided with written and verbal teaching regarding breath support exercises.  The patient was provided with verbal and written Flow phonation therapy and resonant voice  therapy.  Patient reads words, phrases, sentences, and short paragraphs with good vocal quality with 85% accuracy.  Generate short sentences with good quality voice with 75% accuracy.  Good vocal quality in off the cuff remarks with 75% accuracy.      Assessment / Recommendations / Plan   Plan Continue with current plan of care      Progression Toward Goals   Progression toward goals Progressing toward goals            SLP Education - 06/10/20 1606    Education Details Take responsibility for maintaining clear vocal quality    Person(s) Educated Patient    Methods Explanation    Comprehension Verbalized understanding              SLP Long Term Goals - 06/10/20 1609      SLP  LONG TERM GOAL #1   Title The patient will demonstrate independent understanding of vocal hygiene concepts and extrinsic laryngeal muscle stretches.    Status Achieved      SLP LONG TERM GOAL #2   Title The patient will be independent for abdominal breathing and breath support exercises.    Status Achieved      SLP LONG TERM GOAL #3   Title The patient will minimize vocal tension via resonant voice therapy (or comparable technique) with min SLP cues with 80% accuracy.    Status Achieved      SLP LONG TERM GOAL #4   Title The patient will maintain relaxed phonation / oral resonance for paragraph length recitation with 80% accuracy.    Time 4    Period Weeks    Status Partially Met    Target Date 07/08/20            Plan - 06/10/20 1608    Clinical Impression Statement The patient is able to generate clear vocal quality with flow phonation technique.  She demonstrates improved endurance for maintaining clear vocal quality and generalization of improved vocal quality to conversational speech.    Speech Therapy Frequency 2x / week    Duration 4 weeks    Treatment/Interventions SLP instruction and feedback;Patient/family education;Other (comment)    Potential to Achieve Goals Good    Potential Considerations Ability to learn/carryover information;Previous level of function;Co-morbidities;Severity of impairments;Cooperation/participation level;Medical prognosis;Family/community support;Pain level    SLP Home Exercise Plan Provided    Consulted and Agree with Plan of Care Patient           Patient will benefit from skilled therapeutic intervention in order to improve the following deficits and impairments:   Dysphonia - Plan: SLP plan of care cert/re-cert    Problem List Patient Active Problem List   Diagnosis Date Noted  . Vitamin D deficiency 08/22/2019  . History of thrombosis 08/22/2019  . Chronic fatigue 07/17/2019  . Osteoporosis 05/12/2019  . STEC (Shiga  toxin-producing Escherichia coli) infection 04/06/2019  . Diverticulitis 04/06/2019  . Thrombocytopenia ([place]) 01/23/2019  . Leukopenia 01/23/2019  . Lymphedema of both lower extremities 01/16/2019  . Polyarthralgia 10/14/2018  . Headache disorder 10/14/2018  . Chronic daily headache 10/11/2018  . COPD (chronic obstructive pulmonary disease) ([place]) 10/11/2018  . Thoracic aortic atherosclerosis ([place]) 10/11/2018  . Multiple episodes of deep venous thrombosis ([place]) 09/17/2018  . IBS (irritable bowel syndrome) 09/17/2018  . Healthcare maintenance 09/17/2018  . Primary osteoarthritis of right hip 09/03/2018  . Presence of right hip implant 09/03/2018  . Morbid obesity with BMI of 45.0-49.9, adult ([place]) 08/23/2018  .  Shortness of breath 08/22/2018  . History of ductal carcinoma in situ (DCIS) of breast 07/20/2018  . Aromatase inhibitor use 07/20/2018  . Abnormal findings on auscultation 07/16/2018  . Encounter for long-term (current) use of high-risk medication 07/16/2018  . Fibromyalgia 07/16/2018  . Other forms of systemic lupus erythematosus ([place]) 07/16/2018  . Other osteoporosis without current pathological fracture 07/16/2018  . Seronegative arthritis 07/16/2018   Leroy Sea, MS/CCC- SLP  Lou Miner 06/10/2020, 4:12 PM  [place] MAIN [facility] SERVICES [address] [place], [place], [address] Phone: [phone]   Fax:  [phone]   Name: Victoria [name] MRN: [id number] Date of Birth: August 28, 1960

## 2020-06-21 ENCOUNTER — Ambulatory Visit: Payer: Medicare Other | Admitting: Speech Pathology

## 2020-06-23 ENCOUNTER — Encounter: Payer: Medicare Other | Admitting: Speech Pathology

## 2020-06-28 ENCOUNTER — Encounter: Payer: Self-pay | Admitting: Student in an Organized Health Care Education/Training Program

## 2020-06-28 ENCOUNTER — Ambulatory Visit
Payer: Medicare Other | Attending: Student in an Organized Health Care Education/Training Program | Admitting: Student in an Organized Health Care Education/Training Program

## 2020-06-28 ENCOUNTER — Other Ambulatory Visit: Payer: Self-pay

## 2020-06-28 VITALS — BP 122/56 | HR 86 | Temp 97.2°F | Resp 16 | Ht <= 58 in | Wt 192.0 lb

## 2020-06-28 DIAGNOSIS — M2550 Pain in unspecified joint: Secondary | ICD-10-CM | POA: Diagnosis present

## 2020-06-28 DIAGNOSIS — G894 Chronic pain syndrome: Secondary | ICD-10-CM | POA: Diagnosis present

## 2020-06-28 DIAGNOSIS — M1380 Other specified arthritis, unspecified site: Secondary | ICD-10-CM | POA: Diagnosis present

## 2020-06-28 DIAGNOSIS — M797 Fibromyalgia: Secondary | ICD-10-CM

## 2020-06-28 DIAGNOSIS — M328 Other forms of systemic lupus erythematosus: Secondary | ICD-10-CM | POA: Insufficient documentation

## 2020-06-28 DIAGNOSIS — M5416 Radiculopathy, lumbar region: Secondary | ICD-10-CM | POA: Insufficient documentation

## 2020-06-28 DIAGNOSIS — I82509 Chronic embolism and thrombosis of unspecified deep veins of unspecified lower extremity: Secondary | ICD-10-CM | POA: Insufficient documentation

## 2020-06-28 DIAGNOSIS — M1611 Unilateral primary osteoarthritis, right hip: Secondary | ICD-10-CM | POA: Diagnosis present

## 2020-06-28 MED ORDER — HYDROCODONE-ACETAMINOPHEN 7.5-325 MG PO TABS: 1.0000 | ORAL_TABLET | Freq: Two times a day (BID) | ORAL | 0 refills | Status: DC | PRN

## 2020-06-28 NOTE — Progress Notes (Signed)
PROVIDER NOTE: Information contained herein reflects review and annotations entered in association with encounter. Interpretation of such information and data should be left to medically-trained personnel. Information provided to patient can be located elsewhere in the medical record under "Patient Instructions". Document created using STT-dictation technology, any transcriptional errors that may result from process are unintentional.    Patient: Victoria [name]  Service Category: E/M  Provider: Gillis Santa, MD  DOB: [birth year]  DOS: 06/28/2020  Specialty: Interventional Pain Management  MRN: [id number]  Setting: Ambulatory outpatient  PCP: Kirk Ruths, MD  Type: Established Patient    Referring Provider: Kirk Ruths, MD  Location: Office  Delivery: Face-to-face     HPI  Victoria [name], a 59 y.o. year old female, is here today because of her Chronic pain syndrome [G89.4]. Ms. Dombek primary complain today is Generalized Body Aches Last encounter: My last encounter with her was on 04/13/2020. Pertinent problems: Ms. Bobst has COPD (chronic obstructive pulmonary disease) ([place]); Polyarthralgia; Morbid obesity with BMI of 45.0-49.9, adult ([place]); Osteoporosis; Chronic fatigue; and History of thrombosis on their pertinent problem list. Pain Assessment: Severity of Chronic pain is reported as a 9 /10. Location: Generalized  / . Onset: More than a month ago. Quality: Aching, Burning, Sharp, Shooting. Timing: Constant. Modifying factor(s): sleeping, meds. Vitals:  height is 4' 7"  (1.397 m) and weight is 192 lb (87.1 kg). Her temporal temperature is 97.2 F (36.2 C) (abnormal). Her blood pressure is 122/56 (abnormal) and her pulse is 86. Her respiration is 16 and oxygen saturation is 100%.   Reason for encounter: medication management.   Patient presents today for medication management.  Is endorsing increased overall body pain.  Believes that it is secondary to lupus.  Has an  appointment with a rheumatologist next month.  Encouraged her to discuss her current symptoms and see if there is anything for lupus management that they could consider.  Patient is requesting an increase in her opioid analgesics which I politely declined until she has seen rheumatology.  Patient continues her hydrocodone 7.5 mg twice daily as prescribed but states that she does wish she had an additional dose to take at night.  She also continues her multimodal analgesics.  Pharmacotherapy Assessment   06/14/2020  04/13/2020   1  Hydrocodone-Acetamin 7.5-325 60.00  30  Bi Lat  [id number]  Gib (4800)  0/0  15.00 MME  Medicare  [place]      Analgesic: Norco 7.5 mg twice daily as needed, quantity 60/month; MME equals 15    Monitoring: [place] PMP: PDMP reviewed during this encounter.       Pharmacotherapy: No side-effects or adverse reactions reported. Compliance: No problems identified. Effectiveness: Clinically acceptable.  Rise Patience, RN  06/28/2020  3:11 PM  Sign when Signing Visit Nursing Pain Medication Assessment:  Safety precautions to be maintained throughout the outpatient stay will include: orient to surroundings, keep bed in low position, maintain call bell within reach at all times, provide assistance with transfer out of bed and ambulation.  Medication Inspection Compliance: Pill count conducted under aseptic conditions, in front of the patient. Neither the pills nor the bottle was removed from the patient's sight at any time. Once count was completed pills were immediately returned to the patient in their original bottle.  Medication: Hydrocodone/APAP Pill/Patch Count: 40 of 60 pills remain Pill/Patch Appearance: Markings consistent with prescribed medication Bottle Appearance: Standard pharmacy container. Clearly labeled. Filled Date: 31 / 8 / 2021 Last Medication intake:  Yesterday  UDS:  Summary  Date Value Ref Range Status  10/06/2019 Note  Final    Comment:     ==================================================================== [employer] Select 13 (MW) ==================================================================== Test                             Result       Flag       Units Drug Present not Declared for Prescription Verification   Hydrocodone                    67           UNEXPECTED ng/mg creat   Hydromorphone                  50           UNEXPECTED ng/mg creat   Norhydrocodone                 99           UNEXPECTED ng/mg creat    Sources of hydrocodone include scheduled prescription medications.    Hydromorphone and norhydrocodone are expected metabolites of    hydrocodone. Hydromorphone is also available as a scheduled    prescription medication. ==================================================================== Test                      Result    Flag   Units      Ref Range   Creatinine              132              mg/dL      >=20 ==================================================================== Declared Medications:  The flagging and interpretation on this report are based on the  following declared medications.  Unexpected results may arise from  inaccuracies in the declared medications.  **Note: The testing scope of this panel does not include the  following reported medications:  Albuterol  Albuterol (Combivent)  Atorvastatin  Benzonatate  Budesonide (Symbicort)  Cyclobenzaprine  Dabigatran (Pradaxa)  Folic Acid  Formoterol (Symbicort)  Gabapentin  Hydroxychloroquine  Ibuprofen  Ipratropium (Combivent)  Leflunomide (Arava)  Letrozole (Femara)  Methotrexate  Omeprazole  Ondansetron  Potassium (Klor-Con)  Prednisone  Tiotropium  Topiramate  Trazodone ==================================================================== For clinical consultation, please call [phone]. ====================================================================      ROS  Constitutional: Diffuse polyarthralgias,  polymyalgias Gastrointestinal: No reported hemesis, hematochezia, vomiting, or acute GI distress Musculoskeletal: Denies any acute onset joint swelling, redness, loss of ROM, or weakness Neurological: No reported episodes of acute onset apraxia, aphasia, dysarthria, agnosia, amnesia, paralysis, loss of coordination, or loss of consciousness  Medication Review  HYDROcodone-acetaminophen, Ipratropium-Albuterol, Tiotropium Bromide Monohydrate, albuterol, atorvastatin, azelastine, benzonatate, budesonide-formoterol, cyclobenzaprine, dabigatran, fluticasone, folic acid, gabapentin, hydroxychloroquine, letrozole, methotrexate, omeprazole, ondansetron, pantoprazole, potassium chloride, topiramate, torsemide, and traZODone  History Review  Allergy: Ms. Burkes is allergic to other, sodium hypochlorite, tape, codeine, flagyl [metronidazole], levofloxacin, and sulfa antibiotics. Drug: Ms. Bove  reports no history of drug use. Alcohol:  reports current alcohol use. Tobacco:  reports that she quit smoking about 21 years ago. Her smoking use included cigarettes. She has a 30.00 pack-year smoking history. She has never used smokeless tobacco. Social: Ms. Dawn  reports that she quit smoking about 21 years ago. Her smoking use included cigarettes. She has a 30.00 pack-year smoking history. She has never used smokeless tobacco. She reports current alcohol use. She reports that she does not use  drugs. Medical:  has a past medical history of Allergy, Asthma, Cancer ([place]), Collagen vascular disease ([place]), COPD (chronic obstructive pulmonary disease) ([place]), Diverticulitis, Fibromyalgia, GERD (gastroesophageal reflux disease), Hearing impaired person, left, IBS (irritable bowel syndrome), Lupus ([place]), Mitral valve disorder, MS (multiple sclerosis) ([place]), Neuromuscular disorder ([place]), Osteoarthritis, Osteoporosis (05/12/2019), Personal history of radiation therapy (2017), Rheumatoid arthritis ([place]), and Seizures  ([place]). Surgical: Ms. Arel  has a past surgical history that includes Breast surgery; Abdominal hysterectomy; Appendectomy; Cholecystectomy; Patent ductus arterious repair; Total hip arthroplasty (Right); Reconstruction tendon pulley hand (Left); Esophagogastroduodenoscopy (egd) with propofol (N/A, 07/07/2019); Colonoscopy with propofol (N/A, 07/07/2019); Breast lumpectomy (Left, 2017); Breast excisional biopsy (Left, 2017); and Breast excisional biopsy (Left, 2004). Family: family history includes Arthritis in her brother; COPD in her mother; Diabetes in her brother; Stroke in her brother and father.  Laboratory Chemistry Profile   Renal Lab Results  Component Value Date   BUN 20 02/17/2020   CREATININE 0.79 02/17/2020   GFRAA >60 02/17/2020   GFRNONAA >60 02/17/2020     Hepatic Lab Results  Component Value Date   AST 20 02/17/2020   ALT 23 02/17/2020   ALBUMIN 3.9 02/17/2020   ALKPHOS 52 02/17/2020     Electrolytes Lab Results  Component Value Date   NA 140 02/17/2020   K 3.7 02/17/2020   CL 110 02/17/2020   CALCIUM 9.1 02/17/2020     Bone No results found for: VD25OH, VD125OH2TOT, [id number], HM0947SJ6, 25OHVITD1, 25OHVITD2, 25OHVITD3, TESTOFREE, TESTOSTERONE   Inflammation (CRP: Acute Phase) (ESR: Chronic Phase) Lab Results  Component Value Date   CRP <0.8 09/12/2018   ESRSEDRATE 16 09/12/2018       Note: Above Lab results reviewed.  Recent Imaging Review  MR BRAIN W WO CONTRAST CLINICAL DATA:  Fluid  EXAM: MRI HEAD WITHOUT AND WITH CONTRAST  TECHNIQUE: Multiplanar, multiecho pulse sequences of the brain and surrounding structures were obtained without and with intravenous contrast.  CONTRAST:  68m GADAVIST GADOBUTROL 1 MMOL/ML IV SOLN  COMPARISON:  Head CT June 04, 2018  FINDINGS: Brain: No acute infarction, hemorrhage, hydrocephalus, extra-axial collection or mass lesion.  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 Scattered  and confluent foci of T2 hyperintensity are seen within the white matter of the cerebral hemispheres, nonspecific, more pronounced than expected for age.  No cerebellopontine angle mass or internal auditory canal lesion is demonstrated.Normal appearance of the 7th and 8th cranial nerves bilaterally.  No focus of abnormal contrast enhancement seen.  Vascular: Normal flow voids.  Skull and upper cervical spine: Normal marrow signal.  Sinuses/Orbits: Negative.  Other: None.  IMPRESSION: 1. No acute intracranial abnormality. 2. 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 3. Scattered and confluent foci of T2 hyperintensity within the white matter of the cerebral hemispheres, nonspecific, more pronounced than expected for age. Differential considerations include chronic microvascular ischemic changes, demyelinating disease and sequelae of prior infectious/inflammatory process.  Electronically Signed   By: KPedro EarlsM.D.   On: 05/08/2020 12:10 Note: Reviewed        Physical Exam  General appearance: Well nourished, well developed, and well hydrated. In no apparent acute distress Mental status: Alert, oriented x 3 (person, place, & time)       Respiratory: No evidence of acute respiratory distress Eyes: PERLA Vitals: BP (!) 122/56   Pulse 86   Temp (!) 97.2 F (36.2 C) (Temporal)   Resp 16   Ht 4' 7"  (1.397 m)   Wt  192 lb (87.1 kg)   SpO2 100%   BMI 44.63 kg/m  BMI: Estimated body mass index is 44.63 kg/m as calculated from the following:   Height as of this encounter: 4' 7"  (1.397 m).   Weight as of this encounter: 192 lb (87.1 kg). Ideal: Patient must be at least 60 in tall to calculate ideal body weight  Lumbar Exam  Skin & Axial Inspection:No masses, redness, or swelling Alignment:Symmetrical Functional NMM:HWKG restricted ROMaffecting both sides Stability:No instability detected Muscle Tone/Strength:Functionally intact.  No obvious neuro-muscular anomalies detected. Sensory (Neurological):Musculoskeletal pain patternand dermatomal pain pattern  Gait & Posture Assessment  Ambulation:Patient ambulates using a cane Gait:Limited. Using assistive device to ambulate Posture:Difficulty standing up straight, due to pain  Lower Extremity Exam    Side:Right lower extremity  Side:Left lower extremity  Stability:No instability observed  Stability:No instability observed  Skin & Extremity Inspection:Evidence of prior arthroplastic surgery  Skin & Extremity Inspection:Skin color, temperature, and hair growth are WNL. No peripheral edema or cyanosis. No masses, redness, swelling, asymmetry, or associated skin lesions. No contractures.  Functional SUP:JSRP restricted ROMfor all joints of the lower extremity   Functional RXY:VOPF restricted ROM   Muscle Tone/Strength:Functionally intact. No obvious neuro-muscular anomalies detected.  Muscle Tone/Strength:Functionally intact. No obvious neuro-muscular anomalies detected.  Sensory (Neurological):Dermatomal pain pattern  Sensory (Neurological):Unimpaired  DTR: Patellar:0: absent Achilles:deferred today Plantar:deferred today  DTR: Patellar:0: absent Achilles:deferred today Plantar:deferred today  Palpation:No palpable anomalies  Palpation:No palpable anomalies     Assessment   Status Diagnosis  Controlled Controlled Controlled 1. Chronic pain syndrome   2. Lumbar radiculopathy   3. Primary osteoarthritis of right hip   4. Fibromyalgia   5. Polyarthralgia   6. Seronegative arthritis   7. Other forms of systemic lupus erythematosus, unspecified organ involvement status ([place])   8. Multiple episodes of deep venous thrombosis (HCC)       Plan of Care  Problem-specific:  No problem-specific Assessment & Plan notes found for this encounter.  Ms. Emmani Lesueur has a current  medication list which includes the following long-term medication(s): albuterol, atorvastatin, azelastine, budesonide-formoterol, dabigatran, fluticasone, gabapentin, [START ON 07/13/2020] hydrocodone-acetaminophen, [START ON 08/12/2020] hydrocodone-acetaminophen, [START ON 09/11/2020] hydrocodone-acetaminophen, omeprazole, potassium chloride, tiotropium bromide monohydrate, topiramate, torsemide, trazodone, hydrocodone-acetaminophen, hydrocodone-acetaminophen, and pantoprazole.  Pharmacotherapy (Medications Ordered): Meds ordered this encounter  Medications  . HYDROcodone-acetaminophen (NORCO) 7.5-325 MG tablet    Sig: Take 1 tablet by mouth 2 (two) times daily as needed for moderate pain.    Dispense:  60 tablet    Refill:  0    For chronic pain syndrome. To last 30 days from previous fill date  . HYDROcodone-acetaminophen (NORCO) 7.5-325 MG tablet    Sig: Take 1 tablet by mouth 2 (two) times daily as needed for moderate pain.    Dispense:  60 tablet    Refill:  0    For chronic pain syndrome. To last 30 days from previous fill date  . HYDROcodone-acetaminophen (NORCO) 7.5-325 MG tablet    Sig: Take 1 tablet by mouth 2 (two) times daily as needed for moderate pain.    Dispense:  60 tablet    Refill:  0    For chronic pain syndrome. To last 30 days from previous fill date   Orders:  No orders of the defined types were placed in this encounter.  Follow-up plan:   Return in about 3 months (around 09/28/2020) for Medication Management, in person.   Recent Visits Date Type Provider Dept  04/13/20  Office Visit Gillis Santa, MD [facility]  Showing recent visits within past 90 days and meeting all other requirements Today's Visits Date Type Provider Dept  06/28/20 Office Visit Gillis Santa, MD [facility]  Showing today's visits and meeting all other requirements Future Appointments No visits were found meeting these conditions. Showing future appointments within next  90 days and meeting all other requirements  I discussed the assessment and treatment plan with the patient. The patient was provided an opportunity to ask questions and all were answered. The patient agreed with the plan and demonstrated an understanding of the instructions.  Patient advised to call back or seek an in-person evaluation if the symptoms or condition worsens.  Duration of encounter: 30 minutes.  Note by: Gillis Santa, MD Date: 06/28/2020; Time: 3:16 PM

## 2020-06-28 NOTE — Patient Instructions (Signed)
Hydrocodone with acetaminophen to last until 10/11/2020 has been escribed to your pharmacy.

## 2020-06-28 NOTE — Progress Notes (Signed)
Nursing Pain Medication Assessment:  Safety precautions to be maintained throughout the outpatient stay will include: orient to surroundings, keep bed in low position, maintain call bell within reach at all times, provide assistance with transfer out of bed and ambulation.  Medication Inspection Compliance: Pill count conducted under aseptic conditions, in front of the patient. Neither the pills nor the bottle was removed from the patient's sight at any time. Once count was completed pills were immediately returned to the patient in their original bottle.  Medication: Hydrocodone/APAP Pill/Patch Count: 40 of 60 pills remain Pill/Patch Appearance: Markings consistent with prescribed medication Bottle Appearance: Standard pharmacy container. Clearly labeled. Filled Date: 26 / 8 / 2021 Last Medication intake:  Yesterday

## 2020-06-29 ENCOUNTER — Encounter: Payer: Self-pay | Admitting: Speech Pathology

## 2020-06-29 ENCOUNTER — Ambulatory Visit: Payer: Medicare Other | Admitting: Speech Pathology

## 2020-06-29 DIAGNOSIS — R490 Dysphonia: Secondary | ICD-10-CM | POA: Diagnosis not present

## 2020-06-29 NOTE — Therapy (Signed)
[place] MAIN [facility] SERVICES [address] [place], [place], [address] Phone: [phone]   Fax:  [phone]  Speech Language Pathology Treatment  Patient Details  Name: Victoria [name] MRN: [id number] Date of Birth: [birth year] Referring Provider (SLP): Dr. Pryor Ochoa   Encounter Date: 06/29/2020   End of Session - 06/29/20 1631    Visit Number 14    Number of Visits 21    Date for SLP Re-Evaluation 07/08/20    Authorization Type Medicare    Authorization Time Period 05/31/2020    Authorization - Visit Number 4    Progress Note Due on Visit 10    SLP Start Time 1515    SLP Stop Time  1600    SLP Time Calculation (min) 45 min    Activity Tolerance Patient tolerated treatment well           Past Medical History:  Diagnosis Date  . Allergy   . Asthma   . Cancer ([place])    Breast Left, DCIS 2018  . Collagen vascular disease (HCC)    Lupus  . COPD (chronic obstructive pulmonary disease) ([place])   . Diverticulitis   . Fibromyalgia   . GERD (gastroesophageal reflux disease)   . Hearing impaired person, left   . IBS (irritable bowel syndrome)   . Lupus ([place])   . Mitral valve disorder   . MS (multiple sclerosis) ([place])   . Neuromuscular disorder ([place])   . Osteoarthritis   . Osteoporosis 05/12/2019  . Personal history of radiation therapy 2017   left   . Rheumatoid arthritis ([place])   . Seizures ([place])     Past Surgical History:  Procedure Laterality Date  . ABDOMINAL HYSTERECTOMY    . APPENDECTOMY    . BREAST EXCISIONAL BIOPSY Left 2017   lumpectomy with radation  . BREAST EXCISIONAL BIOPSY Left 2004   neg surgical bx   . BREAST LUMPECTOMY Left 2017   DCIS  . BREAST SURGERY    . CHOLECYSTECTOMY    . COLONOSCOPY WITH PROPOFOL N/A 07/07/2019   Procedure: COLONOSCOPY WITH PROPOFOL;  Surgeon: Toledo, Benay Pike, MD;  Location: [facility] ENDOSCOPY;  Service: Gastroenterology;  Laterality: N/A;  . ESOPHAGOGASTRODUODENOSCOPY (EGD) WITH  PROPOFOL N/A 07/07/2019   Procedure: ESOPHAGOGASTRODUODENOSCOPY (EGD) WITH PROPOFOL;  Surgeon: Toledo, Benay Pike, MD;  Location: [facility] ENDOSCOPY;  Service: Gastroenterology;  Laterality: N/A;  . PATENT DUCTUS ARTERIOUS REPAIR    . RECONSTRUCTION TENDON PULLEY HAND Left   . TOTAL HIP ARTHROPLASTY Right     There were no vitals filed for this visit.   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       Treatment Provided   Treatment provided Cognitive-Linquistic      Pain Assessment   Pain Score --   Chronic pain     Cognitive-Linquistic Treatment   Treatment focused on Voice;Patient/family/caregiver education    Skilled Treatment The patient was provided with written and verbal teaching regarding vocal hygiene.  The patient was provided with written and verbal teaching regarding breath support exercises.  The patient was provided with verbal and written teaching RE: flow phonation therapy and  resonant voice therapy.  Patient reads words, phrases, sentences, and short paragraphs with good vocal quality with 85% accuracy.  Generate short sentences with good quality voice with 75% accuracy.  Good vocal quality in off the cuff remarks with 75% accuracy.      Assessment / Recommendations / Plan   Plan Continue with current plan of care      Progression Toward Goals   Progression toward goals Progressing toward goals            SLP Education - 06/29/20 1631    Education Details Take responsibility for maintaining clear vocal quality    Person(s) Educated Patient    Methods Explanation    Comprehension Verbalized understanding              SLP Long Term Goals - 06/10/20 1609      SLP  LONG TERM GOAL #1   Title The patient will demonstrate independent understanding of vocal hygiene concepts and extrinsic laryngeal muscle stretches.    Status Achieved      SLP LONG TERM GOAL #2   Title The patient will be independent for abdominal breathing and breath support exercises.    Status Achieved      SLP LONG TERM GOAL #3   Title The patient will minimize vocal tension via resonant voice therapy (or comparable technique) with min SLP cues with 80% accuracy.    Status Achieved      SLP LONG TERM GOAL #4   Title The patient will maintain relaxed phonation / oral resonance for paragraph length recitation with 80% accuracy.    Time 4    Period Weeks    Status Partially Met    Target Date 07/08/20            Plan - 06/29/20 1632    Clinical Impression Statement The patient is able to generate clear vocal quality with flow phonation technique.  She demonstrates improved endurance for maintaining clear vocal quality and generalization of improved vocal quality to conversational speech.    Speech Therapy Frequency 2x / week    Duration 4 weeks    Treatment/Interventions SLP instruction and feedback;Patient/family education;Other (comment)    Potential to Achieve Goals Good    Potential Considerations Ability to learn/carryover information;Previous level of function;Co-morbidities;Severity of impairments;Cooperation/participation level;Medical prognosis;Family/community support;Pain level    SLP Home Exercise Plan Provided    Consulted and Agree with Plan of Care Patient           Patient will benefit from skilled therapeutic intervention in order to improve the following deficits and impairments:   Dysphonia    Problem List Patient Active Problem List   Diagnosis Date Noted  . Vitamin D deficiency 08/22/2019  . History of thrombosis 08/22/2019  . Chronic fatigue 07/17/2019  . Osteoporosis 05/12/2019  . STEC (Shiga toxin-producing Escherichia coli) infection  04/06/2019  . Diverticulitis 04/06/2019  . Thrombocytopenia ([place]) 01/23/2019  . Leukopenia 01/23/2019  . Lymphedema of both lower extremities 01/16/2019  . Polyarthralgia 10/14/2018  . Headache disorder 10/14/2018  . Chronic daily headache 10/11/2018  . COPD (chronic obstructive pulmonary disease) ([place]) 10/11/2018  . Thoracic aortic atherosclerosis ([place]) 10/11/2018  . Multiple episodes of deep venous thrombosis ([place]) 09/17/2018  . IBS (irritable bowel syndrome) 09/17/2018  . Healthcare maintenance 09/17/2018  . Primary osteoarthritis of right hip 09/03/2018  . Presence of right hip implant 09/03/2018  . Morbid obesity with BMI of 45.0-49.9, adult ([place]) 08/23/2018  . Shortness of breath 08/22/2018  .  History of ductal carcinoma in situ (DCIS) of breast 07/20/2018  . Aromatase inhibitor use 07/20/2018  . Abnormal findings on auscultation 07/16/2018  . Encounter for long-term (current) use of high-risk medication 07/16/2018  . Fibromyalgia 07/16/2018  . Other forms of systemic lupus erythematosus ([place]) 07/16/2018  . Other osteoporosis without current pathological fracture 07/16/2018  . 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 [place], [place], [address] Phone: [phone]   Fax:  [phone]   Name: Savita Runner MRN: [id number] Date of Birth: [birth year]

## 2020-07-05 ENCOUNTER — Ambulatory Visit: Payer: Medicare Other | Admitting: Speech Pathology

## 2020-07-05 ENCOUNTER — Other Ambulatory Visit: Payer: Self-pay

## 2020-07-05 ENCOUNTER — Encounter: Payer: Self-pay | Admitting: Speech Pathology

## 2020-07-05 DIAGNOSIS — R490 Dysphonia: Secondary | ICD-10-CM

## 2020-07-05 NOTE — Therapy (Signed)
[place] MAIN [facility] SERVICES [address] [place], [place], [address] Phone: [phone]   Fax:  [phone]  Speech Language Pathology Treatment  Patient Details  Name: Victoria [name] MRN: [id number] Date of Birth: [birth year] Referring Provider (SLP): Dr. Pryor Ochoa   Encounter Date: 07/05/2020   End of Session - 07/05/20 1529    Visit Number 15    Number of Visits 21    Date for SLP Re-Evaluation 07/08/20    Authorization Type Medicare    Authorization Time Period 05/31/2020    Authorization - Visit Number 5    Progress Note Due on Visit 10    SLP Start Time 1400    SLP Stop Time  1450    SLP Time Calculation (min) 50 min    Activity Tolerance Patient tolerated treatment well           Past Medical History:  Diagnosis Date  . Allergy   . Asthma   . Cancer ([place])    Breast Left, DCIS 2018  . Collagen vascular disease (HCC)    Lupus  . COPD (chronic obstructive pulmonary disease) ([place])   . Diverticulitis   . Fibromyalgia   . GERD (gastroesophageal reflux disease)   . Hearing impaired person, left   . IBS (irritable bowel syndrome)   . Lupus ([place])   . Mitral valve disorder   . MS (multiple sclerosis) ([place])   . Neuromuscular disorder ([place])   . Osteoarthritis   . Osteoporosis 05/12/2019  . Personal history of radiation therapy 2017   left   . Rheumatoid arthritis ([place])   . Seizures ([place])     Past Surgical History:  Procedure Laterality Date  . ABDOMINAL HYSTERECTOMY    . APPENDECTOMY    . BREAST EXCISIONAL BIOPSY Left 2017   lumpectomy with radation  . BREAST EXCISIONAL BIOPSY Left 2004   neg surgical bx   . BREAST LUMPECTOMY Left 2017   DCIS  . BREAST SURGERY    . CHOLECYSTECTOMY    . COLONOSCOPY WITH PROPOFOL N/A 07/07/2019   Procedure: COLONOSCOPY WITH PROPOFOL;  Surgeon: Toledo, Benay Pike, MD;  Location: [facility] ENDOSCOPY;  Service: Gastroenterology;  Laterality: N/A;  . ESOPHAGOGASTRODUODENOSCOPY (EGD) WITH  PROPOFOL N/A 07/07/2019   Procedure: ESOPHAGOGASTRODUODENOSCOPY (EGD) WITH PROPOFOL;  Surgeon: Toledo, Benay Pike, MD;  Location: [facility]OSCOPY;  Service: Gastroenterology;  Laterality: N/A;  . PATENT DUCTUS ARTERIOUS REPAIR    . RECONSTRUCTION TENDON PULLEY HAND Left   . TOTAL HIP ARTHROPLASTY Right     There were no vitals filed for this visit.   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       Treatment Provided   Treatment provided Cognitive-Linquistic      Pain Assessment   Pain Score --   Chronic pain     Cognitive-Linquistic Treatment   Treatment focused on Voice;Patient/family/caregiver education    Skilled Treatment The patient was provided with written and verbal teaching regarding vocal hygiene.  The patient was provided with written and verbal teaching regarding breath support exercises.  The patient was provided with verbal and written teaching RE: flow phonation therapy  and resonant voice therapy.  Patient reads words, phrases, sentences, and short paragraphs with good vocal quality with 85% accuracy.  Generate short sentences with good quality voice with 75% accuracy.  Good vocal quality in off the cuff remarks with 75% accuracy.      Assessment / Recommendations / Plan   Plan Continue with current plan of care            SLP Education - 07/05/20 1528    Education Details Take responsibility for maintaining a clear vocal quality    Person(s) Educated Patient    Methods Explanation    Comprehension Verbalized understanding              SLP Long Term Goals - 06/10/20 1609      SLP LONG TERM GOAL #1   Title The patient will demonstrate independent  understanding of vocal hygiene concepts and extrinsic laryngeal muscle stretches.    Status Achieved      SLP LONG TERM GOAL #2   Title The patient will be independent for abdominal breathing and breath support exercises.    Status Achieved      SLP LONG TERM GOAL #3   Title The patient will minimize vocal tension via resonant voice therapy (or comparable technique) with min SLP cues with 80% accuracy.    Status Achieved      SLP LONG TERM GOAL #4   Title The patient will maintain relaxed phonation / oral resonance for paragraph length recitation with 80% accuracy.    Time 4    Period Weeks    Status Partially Met    Target Date 07/08/20            Plan - 07/05/20 1529    Clinical Impression Statement The patient is able to generate clear vocal quality with flow phonation technique.  She demonstrates improved endurance for maintaining clear vocal quality and generalization of improved vocal quality to conversational speech.    Speech Therapy Frequency 2x / week    Duration 4 weeks    Treatment/Interventions SLP instruction and feedback;Patient/family education;Other (comment)    Potential to Achieve Goals Good    Potential Considerations Ability to learn/carryover information;Previous level of function;Co-morbidities;Severity of impairments;Cooperation/participation level;Medical prognosis;Family/community support;Pain level    Consulted and Agree with Plan of Care Patient           Patient will benefit from skilled therapeutic intervention in order to improve the following deficits and impairments:   Dysphonia    Problem List Patient Active Problem List   Diagnosis Date Noted  . Vitamin D deficiency 08/22/2019  . History of thrombosis 08/22/2019  . Chronic fatigue 07/17/2019  . Osteoporosis 05/12/2019  . STEC (Shiga toxin-producing Escherichia coli) infection 04/06/2019  . Diverticulitis 04/06/2019  . Thrombocytopenia ([place]) 01/23/2019  . Leukopenia 01/23/2019  .  Lymphedema of both lower extremities 01/16/2019  . Polyarthralgia 10/14/2018  . Headache disorder 10/14/2018  . Chronic daily headache 10/11/2018  . COPD (chronic obstructive pulmonary disease) ([place]) 10/11/2018  . Thoracic aortic atherosclerosis ([place]) 10/11/2018  . Multiple episodes of deep venous thrombosis ([place]) 09/17/2018  . IBS (irritable bowel syndrome) 09/17/2018  . Healthcare maintenance 09/17/2018  . Primary osteoarthritis of right hip 09/03/2018  . Presence of right hip implant 09/03/2018  . Morbid obesity with BMI of 45.0-49.9, adult ([place]) 08/23/2018  . Shortness of breath 08/22/2018  . History of ductal carcinoma in situ (DCIS) of breast 07/20/2018  . Aromatase inhibitor use 07/20/2018  . Abnormal findings on  auscultation 07/16/2018  . Encounter for long-term (current) use of high-risk medication 07/16/2018  . Fibromyalgia 07/16/2018  . Other forms of systemic lupus erythematosus ([place]) 07/16/2018  . Other osteoporosis without current pathological fracture 07/16/2018  . Seronegative arthritis 07/16/2018   Leroy Sea, MS/CCC- SLP  Lou Miner 07/05/2020, 3:30 PM  [place] MAIN [facility] SERVICES [address] [place], [place], [address] Phone: [phone]   Fax:  [phone]   Name: Jan Walters MRN: [id number] Date of Birth: [birth year]

## 2020-07-07 ENCOUNTER — Ambulatory Visit: Payer: Medicare Other | Attending: Otolaryngology | Admitting: Speech Pathology

## 2020-07-07 ENCOUNTER — Encounter: Payer: Self-pay | Admitting: Speech Pathology

## 2020-07-07 ENCOUNTER — Other Ambulatory Visit: Payer: Self-pay

## 2020-07-07 DIAGNOSIS — R490 Dysphonia: Secondary | ICD-10-CM | POA: Diagnosis not present

## 2020-07-07 NOTE — Therapy (Signed)
[place] MAIN [facility] SERVICES [address] [place], [place], [address] Phone: [phone]   Fax:  [phone]  Speech Language Pathology Treatment/Discharge Summary  Patient Details  Name: Victoria [name] MRN: [id number] Date of Birth: [birth year] Referring Provider (SLP): Dr. Pryor Ochoa   Encounter Date: 07/07/2020   End of Session - 07/07/20 1523    Visit Number 16    Number of Visits 21    Date for SLP Re-Evaluation 07/08/20    Authorization Type Medicare    Authorization Time Period 05/31/2020    Authorization - Visit Number 6    Progress Note Due on Visit 10    SLP Start Time 1400    SLP Stop Time  1455    SLP Time Calculation (min) 55 min    Activity Tolerance Patient tolerated treatment well           Past Medical History:  Diagnosis Date  . Allergy   . Asthma   . Cancer ([place])    Breast Left, DCIS 2018  . Collagen vascular disease (HCC)    Lupus  . COPD (chronic obstructive pulmonary disease) ([place])   . Diverticulitis   . Fibromyalgia   . GERD (gastroesophageal reflux disease)   . Hearing impaired person, left   . IBS (irritable bowel syndrome)   . Lupus ([place])   . Mitral valve disorder   . MS (multiple sclerosis) ([place])   . Neuromuscular disorder ([place])   . Osteoarthritis   . Osteoporosis 05/12/2019  . Personal history of radiation therapy 2017   left   . Rheumatoid arthritis ([place])   . Seizures ([place])     Past Surgical History:  Procedure Laterality Date  . ABDOMINAL HYSTERECTOMY    . APPENDECTOMY    . BREAST EXCISIONAL BIOPSY Left 2017   lumpectomy with radation  . BREAST EXCISIONAL BIOPSY Left 2004   neg surgical bx   . BREAST LUMPECTOMY Left 2017   DCIS  . BREAST SURGERY    . CHOLECYSTECTOMY    . COLONOSCOPY WITH PROPOFOL N/A 07/07/2019   Procedure: COLONOSCOPY WITH PROPOFOL;  Surgeon: Toledo, Benay Pike, MD;  Location: [facility] ENDOSCOPY;  Service: Gastroenterology;  Laterality: N/A;  . ESOPHAGOGASTRODUODENOSCOPY  (EGD) WITH PROPOFOL N/A 07/07/2019   Procedure: ESOPHAGOGASTRODUODENOSCOPY (EGD) WITH PROPOFOL;  Surgeon: Toledo, Benay Pike, MD;  Location: [facility] ENDOSCOPY;  Service: Gastroenterology;  Laterality: N/A;  . PATENT DUCTUS ARTERIOUS REPAIR    . RECONSTRUCTION TENDON PULLEY HAND Left   . TOTAL HIP ARTHROPLASTY Right     There were no vitals filed for this visit.   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       Treatment Provided   Treatment provided Cognitive-Linquistic      Pain Assessment   Pain Score --   Chronic pain     Cognitive-Linquistic Treatment   Treatment focused on Voice;Patient/family/caregiver education    Skilled Treatment The patient was provided with written and verbal teaching regarding vocal hygiene.  The patient was provided with written and verbal teaching regarding breath support exercises.  The patient was provided with verbal and written teaching RE: flow  phonation therapy and resonant voice therapy.  Patient reads words, phrases, sentences, and lengthy paragraphs with good vocal quality with 85% accuracy.  Generate structured conversation with good quality voice with 85% accuracy.  Good vocal quality in off the cuff remarks with 80% accuracy.      Assessment / Recommendations / Plan   Plan Discharge SLP treatment due to (comment);All goals met      Progression Toward Goals   Progression toward goals Goals met, education completed, patient discharged from [place] Education - 07/07/20 1522    Education Details Take responsibility for maintaining a clear vocal quality.    Person(s) Educated Patient    Methods Explanation    Comprehension  Verbalized understanding              SLP Long Term Goals - 07/07/20 1526      SLP LONG TERM GOAL #1   Title The patient will demonstrate independent understanding of vocal hygiene concepts and extrinsic laryngeal muscle stretches.    Status Achieved      SLP LONG TERM GOAL #2   Title The patient will be independent for abdominal breathing and breath support exercises.    Status Achieved      SLP LONG TERM GOAL #3   Title The patient will minimize vocal tension via resonant voice therapy (or comparable technique) with min SLP cues with 80% accuracy.    Status Achieved      SLP LONG TERM GOAL #4   Title The patient will maintain relaxed phonation / oral resonance for paragraph length recitation with 80% accuracy.    Status Achieved            Plan - 07/07/20 1523    Clinical Impression Statement The patient is able to generate clear vocal quality and maintain clear vocal quality in conversational speech with at least 80% accuracy.  She reports improved vocal quality while talking with friends and family and that she has been able to participate in choir more often.    Speech Therapy Frequency Other (comment)   Discharge   Treatment/Interventions SLP instruction and feedback;Patient/family education;Other (comment)    Potential to Achieve Goals Good    Potential Considerations Ability to learn/carryover information;Previous level of function;Co-morbidities;Severity of impairments;Cooperation/participation level;Medical prognosis;Family/community support;Pain level    SLP Home Exercise Plan Provided    Consulted and Agree with Plan of Care Patient           Patient will benefit from skilled therapeutic intervention in order to improve the following deficits and impairments:   Dysphonia    Problem List Patient Active Problem List   Diagnosis Date Noted  . Vitamin D deficiency 08/22/2019  . History of thrombosis 08/22/2019  . Chronic fatigue 07/17/2019  . Osteoporosis  05/12/2019  . STEC (Shiga toxin-producing Escherichia coli) infection 04/06/2019  . Diverticulitis 04/06/2019  . Thrombocytopenia ([place]) 01/23/2019  . Leukopenia 01/23/2019  . Lymphedema of both lower extremities 01/16/2019  . Polyarthralgia 10/14/2018  . Headache disorder 10/14/2018  . Chronic daily headache 10/11/2018  . COPD (chronic obstructive pulmonary disease) ([place]) 10/11/2018  . Thoracic aortic atherosclerosis ([place]) 10/11/2018  . Multiple episodes of deep venous thrombosis ([place]) 09/17/2018  . IBS (irritable bowel syndrome) 09/17/2018  . Healthcare maintenance 09/17/2018  . Primary osteoarthritis of right hip 09/03/2018  . Presence of right hip implant 09/03/2018  . Morbid obesity with BMI of 45.0-49.9, adult ([place]) 08/23/2018  . Shortness of  breath 08/22/2018  . History of ductal carcinoma in situ (DCIS) of breast 07/20/2018  . Aromatase inhibitor use 07/20/2018  . Abnormal findings on auscultation 07/16/2018  . Encounter for long-term (current) use of high-risk medication 07/16/2018  . Fibromyalgia 07/16/2018  . Other forms of systemic lupus erythematosus ([place]) 07/16/2018  . Other osteoporosis without current pathological fracture 07/16/2018  . Seronegative arthritis 07/16/2018   Leroy Sea, MS/CCC- SLP  Lou Miner 07/07/2020, 3:28 PM  [place] MAIN [facility] SERVICES [address] [place], [place], [address] Phone: [phone]   Fax:  [phone]   Name: Imagine Nest MRN: [id number] Date of Birth: 03-18-1961

## 2020-07-13 ENCOUNTER — Ambulatory Visit: Payer: Medicare Other | Admitting: Speech Pathology

## 2020-07-13 ENCOUNTER — Encounter: Payer: Medicare Other | Admitting: Student in an Organized Health Care Education/Training Program

## 2020-07-15 ENCOUNTER — Encounter: Payer: Medicare Other | Admitting: Speech Pathology

## 2020-07-19 ENCOUNTER — Encounter: Payer: Medicare Other | Admitting: Speech Pathology

## 2020-07-21 ENCOUNTER — Encounter: Payer: Medicare Other | Admitting: Speech Pathology

## 2020-07-27 ENCOUNTER — Encounter: Payer: Medicare Other | Admitting: Speech Pathology

## 2020-08-03 ENCOUNTER — Inpatient Hospital Stay: Payer: Medicare Other

## 2020-08-03 ENCOUNTER — Inpatient Hospital Stay: Payer: Medicare Other | Attending: Oncology

## 2020-08-03 ENCOUNTER — Ambulatory Visit: Payer: Medicare Other | Admitting: Speech Pathology

## 2020-08-03 DIAGNOSIS — D0512 Intraductal carcinoma in situ of left breast: Secondary | ICD-10-CM | POA: Diagnosis not present

## 2020-08-03 DIAGNOSIS — Z95828 Presence of other vascular implants and grafts: Secondary | ICD-10-CM

## 2020-08-03 DIAGNOSIS — Z452 Encounter for adjustment and management of vascular access device: Secondary | ICD-10-CM | POA: Diagnosis present

## 2020-08-03 MED ORDER — SODIUM CHLORIDE 0.9% FLUSH
10.0000 mL | Freq: Once | INTRAVENOUS | Status: AC
  Administered 2020-08-03: 14:00:00 10 mL via INTRAVENOUS
  Filled 2020-08-03: qty 10

## 2020-08-03 MED ORDER — HEPARIN SOD (PORK) LOCK FLUSH 100 UNIT/ML IV SOLN
500.0000 [IU] | Freq: Once | INTRAVENOUS | Status: AC
  Administered 2020-08-03: 500 [IU] via INTRAVENOUS
  Filled 2020-08-03: qty 5

## 2020-08-05 ENCOUNTER — Encounter: Payer: Medicare Other | Admitting: Speech Pathology

## 2020-08-10 ENCOUNTER — Ambulatory Visit: Payer: Medicare Other

## 2020-08-17 ENCOUNTER — Other Ambulatory Visit: Payer: Self-pay

## 2020-08-17 ENCOUNTER — Ambulatory Visit: Payer: Medicare Other | Attending: Otolaryngology

## 2020-08-17 DIAGNOSIS — M6281 Muscle weakness (generalized): Secondary | ICD-10-CM | POA: Diagnosis present

## 2020-08-17 DIAGNOSIS — M79604 Pain in right leg: Secondary | ICD-10-CM | POA: Insufficient documentation

## 2020-08-17 DIAGNOSIS — I890 Lymphedema, not elsewhere classified: Secondary | ICD-10-CM | POA: Insufficient documentation

## 2020-08-17 DIAGNOSIS — R269 Unspecified abnormalities of gait and mobility: Secondary | ICD-10-CM | POA: Insufficient documentation

## 2020-08-17 DIAGNOSIS — R2681 Unsteadiness on feet: Secondary | ICD-10-CM | POA: Diagnosis not present

## 2020-08-17 NOTE — Therapy (Signed)
[place] MAIN [facility] SERVICES [address] [place], [place], [address] Phone: [phone]   Fax:  [phone]  Physical Therapy Evaluation  Patient Details  Name: Victoria [name] MRN: [id number] Date of Birth: 1961/05/14 Referring Provider (PT): Vladimir Crofts, MD   Encounter Date: 08/17/2020   PT End of Session - 08/17/20 1226    Visit Number 1    Number of Visits 17    Date for PT Re-Evaluation 10/12/20    PT Start Time 1111    PT Stop Time 1200    PT Time Calculation (min) 49 min    Equipment Utilized During Treatment Gait belt    Activity Tolerance Patient tolerated treatment well    Behavior During Therapy [facility] for tasks assessed/performed           Past Medical History:  Diagnosis Date  . Allergy   . Asthma   . Cancer ([place])    Breast Left, DCIS 2018  . Collagen vascular disease (HCC)    Lupus  . COPD (chronic obstructive pulmonary disease) ([place])   . Diverticulitis   . Fibromyalgia   . GERD (gastroesophageal reflux disease)   . Hearing impaired person, left   . IBS (irritable bowel syndrome)   . Lupus ([place])   . Mitral valve disorder   . MS (multiple sclerosis) ([place])   . Neuromuscular disorder ([place])   . Osteoarthritis   . Osteoporosis 05/12/2019  . Personal history of radiation therapy 2017   left   . Rheumatoid arthritis ([place])   . Seizures ([place])     Past Surgical History:  Procedure Laterality Date  . ABDOMINAL HYSTERECTOMY    . APPENDECTOMY    . BREAST EXCISIONAL BIOPSY Left 2017   lumpectomy with radation  . BREAST EXCISIONAL BIOPSY Left 2004   neg surgical bx   . BREAST LUMPECTOMY Left 2017   DCIS  . BREAST SURGERY    . CHOLECYSTECTOMY    . COLONOSCOPY WITH PROPOFOL N/A 07/07/2019   Procedure: COLONOSCOPY WITH PROPOFOL;  Surgeon: Toledo, Benay Pike, MD;  Location: [facility] ENDOSCOPY;  Service: Gastroenterology;  Laterality: N/A;  . ESOPHAGOGASTRODUODENOSCOPY (EGD) WITH PROPOFOL N/A 07/07/2019   Procedure:  ESOPHAGOGASTRODUODENOSCOPY (EGD) WITH PROPOFOL;  Surgeon: Toledo, Benay Pike, MD;  Location: [facility] ENDOSCOPY;  Service: Gastroenterology;  Laterality: N/A;  . PATENT DUCTUS ARTERIOUS REPAIR    . RECONSTRUCTION TENDON PULLEY HAND Left   . TOTAL HIP ARTHROPLASTY Right     There were no vitals filed for this visit.    [facility] PT Assessment - 08/17/20 0001      Assessment   Medical Diagnosis imbalance    Referring Provider (PT) Vladimir Crofts, MD    Onset Date/Surgical Date 06/15/20      Precautions   Precautions Fall      Restrictions   Weight Bearing Restrictions No      Balance Screen   Has the patient fallen in the past 6 months No    How many times? 0    Has the patient had a decrease in activity level because of a fear of falling?  Yes      [place] residence    Living Arrangements Alone   has small dog   Type of [place] to enter    Entrance Stairs-Number of Steps 3    Home Layout One level    [place] seat  Standardized Balance Assessment   Standardized Balance Assessment Berg Balance Test      Berg Balance Test   Sit to Stand Able to stand without using hands and stabilize independently    Standing Unsupported Able to stand safely 2 minutes    Sitting with Back Unsupported but Feet Supported on Floor or Stool Able to sit safely and securely 2 minutes    Stand to Sit Sits safely with minimal use of hands    Transfers Able to transfer safely, minor use of hands    Standing Unsupported with Eyes Closed Able to stand 10 seconds safely    Standing Unsupported with Feet Together Able to place feet together independently and stand 1 minute safely    From Standing, Reach Forward with Outstretched Arm Can reach forward >5 cm safely (2")    From Standing Position, Pick up Object from Floor Able to pick up shoe safely and easily   back pain with forward bending   From Standing Position, Turn to Look  Behind Over each Shoulder Looks behind from both sides and weight shifts well    Turn 360 Degrees Able to turn 360 degrees safely one side only in 4 seconds or less    Standing Unsupported, Alternately Place Feet on Step/Stool Able to stand independently and safely and complete 8 steps in 20 seconds    Standing Unsupported, One Foot in Front Able to plae foot ahead of the other independently and hold 30 seconds    Standing on One Leg Able to lift leg independently and hold equal to or more than 3 seconds    Total Score 50            PAIN: Pt reports B LE pain and LBP with hx of 5 herniated discs. Worst: 10/10 Least 3/10  POSTURE: Seated: forward head rounded shoulders, feet not touching floor Standing: lack of lumbar curvature, bilateral LE ER slightly with a widened BOS  STRENGTH:  Graded on a 0-5 scale Muscle Group Left Right  Hip Flex 3/5* 3/5*  Hip Abd 3/5 3+/5  Hip Add 2+/5 3-/5  Hip Ext /5 /5  Hip IR/ER /5 /5  Knee Flex 4-/5 3+/5  Knee Ext 4-/5 3*/5  Ankle DF 3/5 3/5  Ankle PF 4/5 4/5   *unable to perform full formal assessment due to pain   SENSATION:  BLE : normal to light touch B  SOMATOSENSORY:  Pt reported N/T in B anterior-lateral thighs reports   COORDINATION: Finger to Nose: Normal         Heel Shin Slide Test: unable to preform due to B LE pain, with R LE pain> L LE pain    FUNCTIONAL MOBILITY:  Stairs- Ascending: step-to, unilateral UE support on rail;  Descending: step-to, unilateral UE support on rail, lateral approach  GAIT: Patient ambulates without use of an AD. Lateral momentum with limited gluteal activation, poor foot clearance bilaterally, limited toe off and heel strike.   OUTCOME MEASURES: TEST Outcome Interpretation  5 times sit<>stand 14.98 sec >60 yo, >15 sec indicates increased risk for falls, (norm for pt <10 sec)  10 meter walk test     0.65 m/s             <1.0 m/s indicates increased risk for falls; limited community  ambulator          Berg Balance Assessment 50/56 <36/56 (100% risk for falls), 37-45 (80% risk for falls); 46-51 (>50% risk for falls); 52-55 (lower risk <25%  of falls)  FOTO 55 (risk adjusted score 50)       HEP:  Access Code: [email or web address] URL: [email or web address]   Date: 08/17/2020 Prepared by: Janna Arch Exercises Standing March with Counter Support - 1 x daily - 7 x weekly - 2 sets - 10 reps - 5 hold Standing Hip Extension with Counter Support - 1 x daily - 7 x weekly - 2 sets - 10 reps - 5 hold Seated Long Arc Quad - 1 x daily - 7 x weekly - 2 sets - 10 reps - 5 hold Seated Heel Toe Raises - 1 x daily - 7 x weekly - 2 sets - 10 reps - 5 hold   Objective measurements completed on examination: See above findings.   Plan - 08/17/20 1256    Clinical Impression Statement Pt is a 60 y/o female with a hx of multiple comorbidities referred to PT for imbalance. On exam, pt presents with low back pain, B LE pain, gross B LE strength impairment and increased risk for falls. Pt's impairments are affecting pt's functional mobility as well, such as limiting pt's prolonged walking, prolonged sitting, transfers, and ability to descend/ascend stairs. Gait assessment also indicates pt with decreased postural stability with prolonged activity. Pt tests/outcomes measures are the following: FOTO 55% (risk-adjusted score 50), 5xSTS 14.98 sec, and 10MWT 0.65 m/s, and BERG score of 50/56. Pt's BERG, 10MWT, and 5xSTS scores all indicate pt at increased risk for falls. PT educated pt on exam findings, POC, and HEP. Pt will benefit from further skilled therapy in order to improve pain, B LE strength, and to decrease fall risk.    Personal Factors and Comorbidities Comorbidity 1;Comorbidity 2;Comorbidity 3+;Fitness    Comorbidities pertinent: hx of thrombosis, chronic fatigue, osteoporisis, HA disorder, polyathralgia, COPD, IBS, Fibromyalgia, lupus    Examination-Activity Limitations  Stairs;Transfers;Locomotion Level;Sleep;Squat;Stand;Reach Overhead    Examination-Participation Restrictions Community Activity;Meal Prep;Laundry;Shop;Volunteer;Yard Work;Interpersonal Relationship    Stability/Clinical Decision Making Evolving/Moderate complexity    Clinical Decision Making Moderate    Rehab Potential Good    PT Frequency 2x / week    PT Duration 8 weeks    PT Treatment/Interventions ADLs/Self Care Home Management;Electrical Stimulation;Moist Heat;DME Instruction;Gait training;Stair training;Cryotherapy;Ultrasound;Functional mobility training;Neuromuscular re-education;Balance training;Therapeutic exercise;Therapeutic activities;Patient/family education;Orthotic Fit/Training;Manual techniques;Passive range of motion;Energy conservation;Taping;Canalith Repostioning;Vestibular;Visual/perceptual remediation/compensation    PT Next Visit Plan Initiate gait, strength and balance exercises    PT Home Exercise Plan see HEP (medbridge link)    Consulted and Agree with Plan of Care Patient           Patient will benefit from skilled therapeutic intervention in order to improve the following deficits and impairments:  Abnormal gait,Decreased activity tolerance,Decreased balance,Decreased endurance,Decreased coordination,Decreased mobility,Decreased strength,Decreased range of motion,Pain,Improper body mechanics,Postural dysfunction,Impaired flexibility,Difficulty walking,Cardiopulmonary status limiting activity,Decreased knowledge of use of DME,Obesity  Visit Diagnosis: Unsteadiness on feet  Gait abnormality  Muscle weakness (generalized)     Problem List Patient Active Problem List   Diagnosis Date Noted  . Vitamin D deficiency 08/22/2019  . History of thrombosis 08/22/2019  . Chronic fatigue 07/17/2019  . Osteoporosis 05/12/2019  . STEC (Shiga toxin-producing Escherichia coli) infection 04/06/2019  . Diverticulitis 04/06/2019  . Thrombocytopenia ([place]) 01/23/2019  .  Leukopenia 01/23/2019  . Lymphedema of both lower extremities 01/16/2019  . Polyarthralgia 10/14/2018  . Headache disorder 10/14/2018  . Chronic daily headache 10/11/2018  . COPD (chronic obstructive pulmonary disease) ([place]) 10/11/2018  . Thoracic aortic atherosclerosis ([place]) 10/11/2018  . Multiple episodes of deep venous thrombosis ([place]) 09/17/2018  .  IBS (irritable bowel syndrome) 09/17/2018  . Healthcare maintenance 09/17/2018  . Primary osteoarthritis of right hip 09/03/2018  . Presence of right hip implant 09/03/2018  . Morbid obesity with BMI of 45.0-49.9, adult ([place]) 08/23/2018  . Shortness of breath 08/22/2018  . History of ductal carcinoma in situ (DCIS) of breast 07/20/2018  . Aromatase inhibitor use 07/20/2018  . Abnormal findings on auscultation 07/16/2018  . Encounter for long-term (current) use of high-risk medication 07/16/2018  . Fibromyalgia 07/16/2018  . Other forms of systemic lupus erythematosus ([place]) 07/16/2018  . Other osteoporosis without current pathological fracture 07/16/2018  . Seronegative arthritis 07/16/2018    Ricard Dillon PT, DPT  08/17/2020, 1:55 PM  [place] MAIN [facility] SERVICES [address] [place], [place], [address] Phone: [phone]   Fax:  [phone]  Name: Victoria [name] MRN: [id number] Date of Birth: [birth year]

## 2020-08-17 NOTE — Patient Instructions (Signed)
HEP  Access Code: [email or web address] URL: [email or web address]   Date: 08/17/2020 Prepared by: Janna Arch Exercises Standing March with Counter Support - 1 x daily - 7 x weekly - 2 sets - 10 reps - 5 hold Standing Hip Extension with Counter Support - 1 x daily - 7 x weekly - 2 sets - 10 reps - 5 hold Seated Long Arc Quad - 1 x daily - 7 x weekly - 2 sets - 10 reps - 5 hold Seated Heel Toe Raises - 1 x daily - 7 x weekly - 2 sets - 10 reps - 5 hold

## 2020-08-19 ENCOUNTER — Encounter: Payer: Self-pay | Admitting: Oncology

## 2020-08-19 ENCOUNTER — Inpatient Hospital Stay: Payer: Medicare Other | Attending: Oncology

## 2020-08-19 ENCOUNTER — Inpatient Hospital Stay (HOSPITAL_BASED_OUTPATIENT_CLINIC_OR_DEPARTMENT_OTHER): Payer: Medicare Other | Admitting: Oncology

## 2020-08-19 ENCOUNTER — Inpatient Hospital Stay: Payer: Medicare Other

## 2020-08-19 VITALS — BP 110/70 | HR 72 | Temp 97.0°F | Resp 18 | Wt 189.0 lb

## 2020-08-19 DIAGNOSIS — Z79811 Long term (current) use of aromatase inhibitors: Secondary | ICD-10-CM | POA: Diagnosis not present

## 2020-08-19 DIAGNOSIS — M8000XA Age-related osteoporosis with current pathological fracture, unspecified site, initial encounter for fracture: Secondary | ICD-10-CM | POA: Insufficient documentation

## 2020-08-19 DIAGNOSIS — D0512 Intraductal carcinoma in situ of left breast: Secondary | ICD-10-CM | POA: Insufficient documentation

## 2020-08-19 DIAGNOSIS — M810 Age-related osteoporosis without current pathological fracture: Secondary | ICD-10-CM | POA: Diagnosis not present

## 2020-08-19 DIAGNOSIS — Z86000 Personal history of in-situ neoplasm of breast: Secondary | ICD-10-CM

## 2020-08-19 DIAGNOSIS — Z95828 Presence of other vascular implants and grafts: Secondary | ICD-10-CM | POA: Diagnosis not present

## 2020-08-19 LAB — CBC WITH DIFFERENTIAL/PLATELET
Abs Immature Granulocytes: 0.01 10*3/uL (ref 0.00–0.07)
Basophils Absolute: 0 10*3/uL (ref 0.0–0.1)
Basophils Relative: 1 %
Eosinophils Absolute: 0 10*3/uL (ref 0.0–0.5)
Eosinophils Relative: 0 %
HCT: 38.7 % (ref 36.0–46.0)
Hemoglobin: 12.8 g/dL (ref 12.0–15.0)
Immature Granulocytes: 0 %
Lymphocytes Relative: 13 %
Lymphs Abs: 0.7 10*3/uL (ref 0.7–4.0)
MCH: 29.9 pg (ref 26.0–34.0)
MCHC: 33.1 g/dL (ref 30.0–36.0)
MCV: 90.4 fL (ref 80.0–100.0)
Monocytes Absolute: 0.3 10*3/uL (ref 0.1–1.0)
Monocytes Relative: 5 %
Neutro Abs: 4.2 10*3/uL (ref 1.7–7.7)
Neutrophils Relative %: 81 %
Platelets: 159 10*3/uL (ref 150–400)
RBC: 4.28 MIL/uL (ref 3.87–5.11)
RDW: 14.2 % (ref 11.5–15.5)
WBC: 5.1 10*3/uL (ref 4.0–10.5)
nRBC: 0 % (ref 0.0–0.2)

## 2020-08-19 LAB — COMPREHENSIVE METABOLIC PANEL
ALT: 28 U/L (ref 0–44)
AST: 24 U/L (ref 15–41)
Albumin: 4.1 g/dL (ref 3.5–5.0)
Alkaline Phosphatase: 64 U/L (ref 38–126)
Anion gap: 10 (ref 5–15)
BUN: 16 mg/dL (ref 6–20)
CO2: 21 mmol/L — ABNORMAL LOW (ref 22–32)
Calcium: 9.1 mg/dL (ref 8.9–10.3)
Chloride: 106 mmol/L (ref 98–111)
Creatinine, Ser: 0.76 mg/dL (ref 0.44–1.00)
GFR, Estimated: >60 mL/min (ref >60)
Glucose, Bld: 149 mg/dL — ABNORMAL HIGH (ref 70–99)
Potassium: 3.7 mmol/L (ref 3.5–5.1)
Sodium: 137 mmol/L (ref 135–145)
Total Bilirubin: 0.2 mg/dL — ABNORMAL LOW (ref 0.3–1.2)
Total Protein: 6.6 g/dL (ref 6.5–8.1)

## 2020-08-19 MED ORDER — ZOLEDRONIC ACID 4 MG/100ML IV SOLN
4.0000 mg | Freq: Once | INTRAVENOUS | Status: AC
  Administered 2020-08-19: 4 mg via INTRAVENOUS
  Filled 2020-08-19: qty 100

## 2020-08-19 MED ORDER — SODIUM CHLORIDE 0.9 % IV SOLN
Freq: Once | INTRAVENOUS | Status: AC
  Filled 2020-08-19: qty 250

## 2020-08-19 NOTE — Progress Notes (Signed)
Pt in for follow up, denies any concerns today. 

## 2020-08-19 NOTE — Progress Notes (Signed)
Hematology/Oncology follow up note [facility] Telephone:([phone] Fax:([phone]) [phone]   Patient Care Team: Kirk Ruths, MD as PCP - General (Internal Medicine) Earlie Server, MD as Consulting Physician (Oncology)  REFERRING PROVIDER: Kirk Ruths, MD CHIEF COMPLAINTS/REASON FOR VISIT:  Follow up for DCIS HISTORY OF PRESENTING ILLNESS:  Victoria [name] is a  60 y.o.  female with PMH listed below who was referred to me for evaluation of history of breast cancer Patient moved from [place]. Also report having left lower extremity blood clot 3 years ago, started on Eliquis for a month and developed pulmonary embolism.  Switch to Pradaxa.  She also had IVC filter.  After she moved to [place], she was temporarily off Pradaxa.  Currently back on anticoagulation. She presented to emergency room on 06/25/2018 for evaluation of right arm pain, complaining about developing knots right upper extremity.  # DCIS:  I obtained previous oncology history from [place]. Medical records were reviewed by me.  Patient was diagnosed with left breast DCIS in 2017, s/p lumpectomy and radiation.  Initially was started on Arimidex however cannot tolerate and was switched to Letrozole since Dec 2017.   # rheumatoid arthritis/lupus follows up with Dr. Cydney Ok.  No rashes follow-up with Dr. Carlyn Reichert   # history of unprovoked VTE, with IVC filter,  recurrent, on Pradaxa # [place] left thigh showed most likely lipoma.  She has also established care with Rheumatology. started on Leflunomide 20mg  daily since March 2020.  Also has been on Plaquenil 200mg    # Chronic anemia and thrombocytopenia. Previous work-up includes normal LDH, folate and vitamin B12 level, negative hepatitis panel, HIV negative.  Negative SPEP-[facility].  Normal immature platelet fraction indicating underproduction. # [id number] pneumonia in December 2020 # 11/27/2019 unilateral  left diagnostic mammogram done on  for evaluation of self palpated mass.  Study showed no mammographic or sonographic evidence of malignancy in the lower outer quadrant of left breast.   INTERVAL HISTORY Victoria [name] is a 60 y.o. female who has above history reviewed by me today presents for follow up visit for management of history of DCIS.  Patient takes aromatase inhibitor, tolerated well.  No new complaints.  Manageable hot flash. Chronic fatigue, unchanged. Review of Systems  Constitutional: Positive for fatigue. Negative for appetite change, chills and fever.  HENT:   Negative for hearing loss and voice change.   Eyes: Negative for eye problems.  Respiratory: Negative for chest tightness, cough and shortness of breath.   Cardiovascular: Negative for chest pain.  Gastrointestinal: Negative for abdominal distention, abdominal pain and blood in stool.  Endocrine: Positive for hot flashes.  Genitourinary: Negative for difficulty urinating and frequency.   Musculoskeletal: Positive for arthralgias. Negative for back pain and myalgias.  Skin: Negative for itching and rash.  Neurological: Negative for extremity weakness.  Hematological: Negative for adenopathy.  Psychiatric/Behavioral: Negative for confusion.    MEDICAL HISTORY:  Past Medical History:  Diagnosis Date  . Allergy   . Asthma   . Cancer ([place])    Breast Left, DCIS 2018  . Collagen vascular disease (H[facility])    Lupus  . COPD (chronic obstructive pulmonary disease) ([place])   . Diverticulitis   . Fibromyalgia   . GERD (gastroesophageal reflux disease)   . Hearing impaired person, left   . IBS (irritable bowel syndrome)   . Lupus ([place])   . Mitral valve disorder   . MS (multiple sclerosis) ([place])   . Neuromuscular disorder ([place])   .  Osteoarthritis   . Osteoporosis 05/12/2019  . Personal history of radiation therapy 2017   left   . Rheumatoid arthritis ([place])   . Seizures ([place])     SURGICAL HISTORY: Past Surgical  History:  Procedure Laterality Date  . ABDOMINAL HYSTERECTOMY    . APPENDECTOMY    . BREAST EXCISIONAL BIOPSY Left 2017   lumpectomy with radation  . BREAST EXCISIONAL BIOPSY Left 2004   neg surgical bx   . BREAST LUMPECTOMY Left 2017   DCIS  . BREAST SURGERY    . CHOLECYSTECTOMY    . COLONOSCOPY WITH PROPOFOL N/A 07/07/2019   Procedure: COLONOSCOPY WITH PROPOFOL;  Surgeon: Toledo, Benay Pike, MD;  Location: [facility] ENDOSCOPY;  Service: Gastroenterology;  Laterality: N/A;  . ESOPHAGOGASTRODUODENOSCOPY (EGD) WITH PROPOFOL N/A 07/07/2019   Procedure: ESOPHAGOGASTRODUODENOSCOPY (EGD) WITH PROPOFOL;  Surgeon: Toledo, Benay Pike, MD;  Location: [facility] ENDOSCOPY;  Service: Gastroenterology;  Laterality: N/A;  . PATENT DUCTUS ARTERIOUS REPAIR    . RECONSTRUCTION TENDON PULLEY HAND Left   . TOTAL HIP ARTHROPLASTY Right     SOCIAL HISTORY: Social History   Socioeconomic History  . Marital status: Widowed    Spouse name: Not on file  . Number of children: 1  . Years of education: Not on file  . Highest education level: Some college, no degree  Occupational History  . Not on file  Tobacco Use  . Smoking status: Former Smoker    Packs/day: 2.00    Years: 15.00    Pack years: 30.00    Types: Cigarettes    Quit date: 06/27/1999    Years since quitting: 21.1  . Smokeless tobacco: Never Used  Vaping Use  . Vaping Use: Never used  Substance and Sexual Activity  . Alcohol use: Yes    Comment: Rarely - approx 6 beers once a month  . Drug use: Never  . Sexual activity: Not Currently  Other Topics Concern  . Not on file  Social History Narrative  . Not on file   Social Determinants of Health   Financial Resource Strain: Not on file  Food Insecurity: Not on file  Transportation Needs: Not on file  Physical Activity: Not on file  Stress: Not on file  Social Connections: Not on file  Intimate Partner Violence: Not on file    FAMILY HISTORY: Family History  Problem Relation Age of  Onset  . COPD Mother   . Stroke Father   . Arthritis Brother   . Diabetes Brother   . Stroke Brother   . Breast cancer Neg Hx     ALLERGIES:  is allergic to other, sodium hypochlorite, tape, codeine, flagyl [metronidazole], levofloxacin, and sulfa antibiotics.  MEDICATIONS:  Current Outpatient Medications  Medication Sig Dispense Refill  . albuterol (PROVENTIL HFA;VENTOLIN HFA) 108 (90 Base) MCG/ACT inhaler Inhale 2 puffs into the lungs every 6 (six) hours as needed for wheezing or shortness of breath. 1 Inhaler 2  . atorvastatin (LIPITOR) 40 MG tablet Take 40 mg by mouth daily.    Marland Kitchen azelastine (ASTELIN) 0.1 % nasal spray Place 2 sprays into both nostrils 2 (two) times daily.    . budesonide-formoterol (SYMBICORT) 160-4.5 MCG/ACT inhaler Inhale 2 puffs into the lungs 2 (two) times daily. 1 Inhaler 12  . cyclobenzaprine (FLEXERIL) 10 MG tablet TAKE 1 TABLET BY MOUTH TWICE A DAY AS NEEDED FOR MUSCLE SPASMS 40 tablet 0  . dabigatran (PRADAXA) 150 MG CAPS capsule Take 1 capsule (150 mg total) by mouth 2 (two) times  daily. 180 capsule 3  . fluticasone (FLONASE) 50 MCG/ACT nasal spray Place into both nostrils.    Marland Kitchen gabapentin (NEURONTIN) 100 MG capsule 200 mg 3 (three) times daily.     Marland Kitchen HYDROcodone-acetaminophen (NORCO) 7.5-325 MG tablet Take 1 tablet by mouth 2 (two) times daily as needed for moderate pain. 60 tablet 0  . hydroxychloroquine (PLAQUENIL) 200 MG tablet Take 200 mg by mouth daily.    Marland Kitchen letrozole (FEMARA) 2.5 MG tablet TAKE 1 TABLET BY MOUTH ONCE DAILY 30 tablet 3  . methotrexate 2.5 MG tablet Take 2.5 mg by mouth once a week.    Marland Kitchen omeprazole (PRILOSEC) 40 MG capsule Take 1 capsule (40 mg total) by mouth 2 (two) times daily. 90 capsule 1  . ondansetron (ZOFRAN-ODT) 8 MG disintegrating tablet Take by mouth every 8 (eight) hours as needed.     . Tiotropium Bromide Monohydrate (SPIRIVA RESPIMAT) 2.5 MCG/ACT AERS Inhale 2 puffs into the lungs every morning. 1 Inhaler 11  . topiramate  (TOPAMAX) 50 MG tablet Take 50 mg by mouth 2 (two) times daily.    . traZODone (DESYREL) 50 MG tablet Take 3 tablets (150 mg total) by mouth at bedtime. 180 tablet 1  . benzonatate (TESSALON) 100 MG capsule Take 1 capsule (100 mg total) by mouth 3 (three) times daily as needed. (Patient not taking: Reported on 08/19/2020) 30 capsule 0  . HYDROcodone-acetaminophen (NORCO) 7.5-325 MG tablet Take 1 tablet by mouth 2 (two) times daily as needed for moderate pain. 60 tablet 0  . HYDROcodone-acetaminophen (NORCO) 7.5-325 MG tablet Take 1 tablet by mouth 2 (two) times daily as needed for moderate pain. 60 tablet 0  . HYDROcodone-acetaminophen (NORCO) 7.5-325 MG tablet Take 1 tablet by mouth 2 (two) times daily as needed for moderate pain. 60 tablet 0  . [START ON 09/11/2020] HYDROcodone-acetaminophen (NORCO) 7.5-325 MG tablet Take 1 tablet by mouth 2 (two) times daily as needed for moderate pain. (Patient not taking: Reported on 08/19/2020) 60 tablet 0  . pantoprazole (PROTONIX) 40 MG tablet Take by mouth. (Patient not taking: No sig reported)    . potassium chloride (K-DUR) 10 MEQ tablet Take 10 mEq by mouth daily. (Patient not taking: Reported on 08/19/2020)    . torsemide (DEMADEX) 10 MG tablet  (Patient not taking: Reported on 08/19/2020)     No current facility-administered medications for this visit.   Facility-Administered Medications Ordered in Other Visits  Medication Dose Route Frequency Provider Last Rate Last Admin  . sodium chloride flush (NS) 0.9 % injection 10 mL  10 mL Intravenous PRN Rickard Patience, MD   10 mL at 01/22/19 1503     PHYSICAL EXAMINATION: ECOG PERFORMANCE STATUS: 1 - Symptomatic but completely ambulatory Vitals:   08/19/20 1120  BP: 110/70  Pulse: 72  Resp: 18  Temp: (!) 97 F (36.1 C)  SpO2: 100%   Filed Weights   08/19/20 1113 08/19/20 1120  Weight: 189 lb (85.7 kg) 189 lb (85.7 kg)    Physical Exam Constitutional:      General: She is not in acute distress.     Appearance: She is not diaphoretic.     Comments: Obese  HENT:     Head: Normocephalic and atraumatic.     Nose: Nose normal.     Mouth/Throat:     Pharynx: No oropharyngeal exudate.  Eyes:     General: No scleral icterus.    Pupils: Pupils are equal, round, and reactive to light.  Cardiovascular:  Rate and Rhythm: Normal rate and regular rhythm.     Heart sounds: No murmur heard.   Pulmonary:     Effort: Pulmonary effort is normal. No respiratory distress.     Breath sounds: No rales.  Chest:     Chest wall: No tenderness.  Abdominal:     General: There is no distension.     Palpations: Abdomen is soft.     Tenderness: There is no abdominal tenderness.  Musculoskeletal:        General: Normal range of motion.     Cervical back: Normal range of motion and neck supple.  Skin:    General: Skin is warm and dry.     Findings: No erythema.  Neurological:     Mental Status: She is alert and oriented to person, place, and time.     Cranial Nerves: No cranial nerve deficit.     Motor: No abnormal muscle tone.     Coordination: Coordination normal.  Psychiatric:        Mood and Affect: Affect normal.   .   LABORATORY DATA:  I have reviewed the data as listed Lab Results  Component Value Date   WBC 5.1 08/19/2020   HGB 12.8 08/19/2020   HCT 38.7 08/19/2020   MCV 90.4 08/19/2020   PLT 159 08/19/2020   Recent Labs    11/18/19 1238 12/19/19 1347 02/17/20 0938 08/19/20 1032  NA 140 139 140 137  K 3.5 3.7 3.7 3.7  CL 108 111 110 106  CO2 22 22 23  21*  GLUCOSE 183* 118* 105* 149*  BUN 14 14 20 16   CREATININE 0.70 0.65 0.79 0.76  CALCIUM 9.2 8.9 9.1 9.1  GFRNONAA >60 >60 >60 >60  GFRAA >60 >60 >60  --   PROT 6.4*  --  6.3* 6.6  ALBUMIN 3.9  --  3.9 4.1  AST 33  --  20 24  ALT 36  --  23 28  ALKPHOS 46  --  52 64  BILITOT 0.4  --  0.3 0.2*   Iron/TIBC/Ferritin/ %Sat No results found for: IRON, TIBC, FERRITIN, IRONPCTSAT   RADIOGRAPHIC STUDIES: I have  personally reviewed the radiological images as listed and agreed with the findings in the report. US venous upper right extremity 06/04/2018  No evidence of DVT within the right upper extremity 07/09/2018 CXR  Interval resolution of subsegmental atelectasis or interstitial edema. Mild chronic bronchitic changes. No acute pneumonia nor pulmonary edema. Thoracic aortic atherosclerosis  No results found.  ASSESSMENT & PLAN:  1. Osteoporosis, unspecified osteoporosis type, unspecified pathological fracture presence   2. History of ductal carcinoma in situ (DCIS) of breast   3. Port-A-Cath in place   4. Aromatase inhibitor use    history of left DICS.  S/p lumpectomy and RT.  Labs reviewed and discussed with patient. Patient is due for bilateral diagnostic mammogram in January 2022. Continue letrozole.  Plan total of 5 years and she will be finishing in December 2022.Marland Kitchen Left mammogram was independent reviewed by me and discussed with patient. Continue letrozole.  Plan for total 5 years.  She will be finishing in December 2022.  #Osteoporosis, Continue calcium and vitamin D supplementation. Due to repeat bone density.  Will obtain Proceed with Zometa today . #Port-A-Cath in place, due to lack of peripheral access.  Continue port flush every 8 weeks #History of DVT and PE, patient is on Pradaxa, not managed by me.  #Chronic anemia e. .  Likely secondary to  autoimmune disorder/RA/lupus Stable hemoglobin. #Chronic autoimmune disorders/rheumatoid arthritis/lupus: Continue follow-up with rheumatology.   Orders Placed This Encounter  Procedures  . MM DIAG BREAST TOMO BILATERAL    Standing Status:   Future    Standing Expiration Date:   08/19/2021    Order Specific Question:   Reason for Exam (SYMPTOM  OR DIAGNOSIS REQUIRED)    Answer:   annual, history of DCIS    Order Specific Question:   Is the patient pregnant?    Answer:   No    Order Specific Question:   Preferred imaging location?     Answer:   [facility]  . US Breast Limited Uni Left Inc Axilla    Standing Status:   Future    Standing Expiration Date:   08/19/2021    Order Specific Question:   Reason for Exam (SYMPTOM  OR DIAGNOSIS REQUIRED)    Answer:   history of DCIS    Order Specific Question:   Preferred imaging location?    Answer:   [facility]  . US Breast Limited Uni Right Inc Axilla    Standing Status:   Future    Standing Expiration Date:   08/19/2021    Order Specific Question:   Reason for Exam (SYMPTOM  OR DIAGNOSIS REQUIRED)    Answer:   history of DCIS    Order Specific Question:   Preferred imaging location?    Answer:   [facility]  . DG Bone Density    Standing Status:   Future    Standing Expiration Date:   08/19/2021    Order Specific Question:   Reason for Exam (SYMPTOM  OR DIAGNOSIS REQUIRED)    Answer:   aromatase inhibitor use, ostoporosis    Order Specific Question:   Is the patient pregnant?    Answer:   No    Order Specific Question:   Preferred imaging location?    Answer:   [facility]    All questions were answered. The patient knows to call the clinic with any problems questions or concerns.  Return of visit: 6 months.   Earlie Server, MD, PhD Hematology Oncology [place] [facility] 08/19/2020

## 2020-08-20 ENCOUNTER — Other Ambulatory Visit: Payer: Self-pay | Admitting: Otolaryngology

## 2020-08-20 ENCOUNTER — Ambulatory Visit: Payer: Medicare Other

## 2020-08-20 DIAGNOSIS — D380 Neoplasm of uncertain behavior of larynx: Secondary | ICD-10-CM

## 2020-08-24 ENCOUNTER — Ambulatory Visit: Payer: Medicare Other | Admitting: Physical Therapy

## 2020-08-24 ENCOUNTER — Other Ambulatory Visit: Payer: Self-pay

## 2020-08-24 DIAGNOSIS — R269 Unspecified abnormalities of gait and mobility: Secondary | ICD-10-CM

## 2020-08-24 DIAGNOSIS — M6281 Muscle weakness (generalized): Secondary | ICD-10-CM

## 2020-08-24 DIAGNOSIS — R2681 Unsteadiness on feet: Secondary | ICD-10-CM

## 2020-08-24 DIAGNOSIS — I890 Lymphedema, not elsewhere classified: Secondary | ICD-10-CM

## 2020-08-24 NOTE — Therapy (Signed)
[place] MAIN [facility] SERVICES [address] [place], [place], [address] Phone: [phone]   Fax:  [phone]  Physical Therapy Treatment  Patient Details  Name: Victoria [name] MRN: [id number] Date of Birth: 1961/06/12 Referring Provider (PT): Vladimir Crofts, MD   Encounter Date: 08/24/2020   PT End of Session - 08/24/20 1400    Visit Number 2    Number of Visits 17    Date for PT Re-Evaluation 10/12/20    PT Start Time 9892    PT Stop Time 1355    PT Time Calculation (min) 41 min    Equipment Utilized During Treatment Gait belt    Activity Tolerance Patient tolerated treatment well    Behavior During Therapy [facility] for tasks assessed/performed           Past Medical History:  Diagnosis Date  . Allergy   . Asthma   . Cancer ([place])    Breast Left, DCIS 2018  . Collagen vascular disease (HCC)    Lupus  . COPD (chronic obstructive pulmonary disease) ([place])   . Diverticulitis   . Fibromyalgia   . GERD (gastroesophageal reflux disease)   . Hearing impaired person, left   . IBS (irritable bowel syndrome)   . Lupus ([place])   . Mitral valve disorder   . MS (multiple sclerosis) ([place])   . Neuromuscular disorder ([place])   . Osteoarthritis   . Osteoporosis 05/12/2019  . Personal history of radiation therapy 2017   left   . Rheumatoid arthritis ([place])   . Seizures ([place])     Past Surgical History:  Procedure Laterality Date  . ABDOMINAL HYSTERECTOMY    . APPENDECTOMY    . BREAST EXCISIONAL BIOPSY Left 2017   lumpectomy with radation  . BREAST EXCISIONAL BIOPSY Left 2004   neg surgical bx   . BREAST LUMPECTOMY Left 2017   DCIS  . BREAST SURGERY    . CHOLECYSTECTOMY    . COLONOSCOPY WITH PROPOFOL N/A 07/07/2019   Procedure: COLONOSCOPY WITH PROPOFOL;  Surgeon: Toledo, Benay Pike, MD;  Location: [facility] ENDOSCOPY;  Service: Gastroenterology;  Laterality: N/A;  . ESOPHAGOGASTRODUODENOSCOPY (EGD) WITH PROPOFOL N/A 07/07/2019   Procedure:  ESOPHAGOGASTRODUODENOSCOPY (EGD) WITH PROPOFOL;  Surgeon: Toledo, Benay Pike, MD;  Location: [facility] ENDOSCOPY;  Service: Gastroenterology;  Laterality: N/A;  . PATENT DUCTUS ARTERIOUS REPAIR    . RECONSTRUCTION TENDON PULLEY HAND Left   . TOTAL HIP ARTHROPLASTY Right     There were no vitals filed for this visit.   Subjective Assessment - 08/24/20 1319    Subjective Patient had to dogsit over the weekend and reports she has pain in her hip and lower back. She denies of any injuries or falls since last therapy session.    Pertinent History Per chart: PMH includes: Lupus, seronegative arthritis, fibromyalgia, osteoporosis, breast cancer(radiation), morbid obesity, DVTs, IBS,COPD, chronic migraine, lymphedema, thrombocytopenia. Seizures, RA, MS? back pain that radiates down into both hips and legs. She has bilateral foot numbness. Toes will turn purple.    Limitations Sitting;Walking    How long can you sit comfortably? Pt says sitting limited to 20 minutes, worsened with spinal extension and improved with flexion.    How long can you stand comfortably? no reported difficulty    How long can you walk comfortably? Pt reports difficulty with prolonged walking    Diagnostic tests MRI done on 05/10/2020 (see physician note)    Patient Stated Goals Pt would like to increae LE strength and  decrease pain in order to walk her dog    Currently in Pain? Yes    Pain Score 9     Pain Location Hip    Pain Orientation Left;Right;Proximal    Pain Descriptors / Indicators Aching;Sharp    Pain Onset More than a month ago           There Ex:  Seated marches x 2 10 reps Seated LAQ x 2 10 reps each leg Seated HS curls with GTB x 2 10 reps each leg Seated heel raiises x 2 10 reps  Standing mini squats x 2 10 reps  Standing Heel raises x 2 10 reps   Standing 3 way hip (abd/flex/ext) x 2 10 reps each leg   NMR: Airex x normal stance x EO/EC x 3 30s each Airex x WBOS x EO/EC x 3 30s each   Clinical  Impression: Patient completed strengthening exercises with increased muscle fatigue due to decreased tissue capacity to load knee and hip joints. Therapist provided verbal cues to maintain spine in neutral as patient demonstrated increased trunk flexion with standing hip exercises. Patient demonstrated increased postural swaying with balance exercises due to decreased proprioception cues. Patient will continue to benefit from skilled physical therapy to improve generalized strength, ROM, and capacity for functional activity.     PT Short Term Goals - 08/17/20 1331      PT SHORT TERM GOAL #1   Title Patient will be independent in home exercise program to improve strength/mobility for better functional independence with ADLs.    Baseline 08/17/2020 Pt issued HEP    Period Weeks    Status New    Target Date 09/14/20             PT Long Term Goals - 08/17/20 1332      PT LONG TERM GOAL #1   Title Patient will increase FOTO score to equal to or greater than 60 to demonstrate statistically significant improvement in mobility and quality of life.    Baseline 08/17/2020 Pt score 55 (risk-adjusted 50)    Time 8    Period Weeks    Status New    Target Date 10/12/20      PT LONG TERM GOAL #2   Title Pt will increase MMT scores by at least 1/2 point to indicate increased B LE strength for performing functional activities.    Baseline 08/17/2020 MMT L/R hip flexion 3/5, 3/5; hip abduction 3/5, 3+/5; hip adduction 2+/5, 3-/5; knee flexion 4-/5, 3+/5, knee extension 4-/5, 3/5; ankle 3/5 B; ankle plantarflexion 4/5 B    Time 8    Period Weeks    Status New    Target Date 10/12/20      PT LONG TERM GOAL #3   Title Patient will increase 10 meter walk test to >1.96m/s as to improve gait speed for better community ambulation and to reduce fall risk.    Baseline 08/17/2020 0.65 m/s no AD    Time 8    Period Weeks    Status New    Target Date 10/12/20      PT LONG TERM GOAL #4   Title Patient (< 89  years old) will complete five times sit to stand test in < 10 seconds indicating an increased LE strength and improved balance.    Baseline 08/17/2020 5xSTS completed in 14.98 sec    Time 8    Period Weeks    Status New    Target Date 10/12/20  PT LONG TERM GOAL #5   Title Patient will increase Berg Balance score by > 6 points to demonstrate decreased fall risk during functional activities.    Baseline 08/17/2020 Berg score 50/56    Time 8    Period Weeks    Target Date 10/12/20                 Plan - 08/24/20 1359    Clinical Impression Statement Patient completed strengthening exercises with increased muscle fatigue due to decreased tissue capacity to load knee and hip joints. Therapist provided verbal cues to maintain spine in neutral as patient demonstrated increased trunk flexion with standing hip exercises. Patient demonstrated increased postural swaying with balance exercises due to decreased proprioception cues. Patient will continue to benefit from skilled physical therapy to improve generalized strength, ROM, and capacity for functional activity.    Personal Factors and Comorbidities Comorbidity 1;Comorbidity 2;Comorbidity 3+;Fitness    Comorbidities pertinent: hx of thrombosis, chronic fatigue, osteoporisis, HA disorder, polyathralgia, COPD, IBS, Fibromyalgia, lupus    Examination-Activity Limitations Stairs;Transfers;Locomotion Level;Sleep;Squat;Stand;Reach Overhead    Examination-Participation Restrictions Community Activity;Meal Prep;Laundry;Shop;Volunteer;Yard Work;Interpersonal Relationship    Stability/Clinical Decision Making Evolving/Moderate complexity    Rehab Potential Good    PT Frequency 2x / week    PT Duration 8 weeks    PT Treatment/Interventions ADLs/Self Care Home Management;Electrical Stimulation;Moist Heat;DME Instruction;Gait training;Stair training;Cryotherapy;Ultrasound;Functional mobility training;Neuromuscular re-education;Balance  training;Therapeutic exercise;Therapeutic activities;Patient/family education;Orthotic Fit/Training;Manual techniques;Passive range of motion;Energy conservation;Taping;Canalith Repostioning;Vestibular;Visual/perceptual remediation/compensation    PT Next Visit Plan Initiate gait, strength and balance exercises    PT Home Exercise Plan see HEP (medbridge link)    Consulted and Agree with Plan of Care Patient           Patient will benefit from skilled therapeutic intervention in order to improve the following deficits and impairments:  Abnormal gait,Decreased activity tolerance,Decreased balance,Decreased endurance,Decreased coordination,Decreased mobility,Decreased strength,Decreased range of motion,Pain,Improper body mechanics,Postural dysfunction,Impaired flexibility,Difficulty walking,Cardiopulmonary status limiting activity,Decreased knowledge of use of DME,Obesity  Visit Diagnosis: Unsteadiness on feet  Gait abnormality  Muscle weakness (generalized)  Lymphedema, not elsewhere classified     Problem List Patient Active Problem List   Diagnosis Date Noted  . Vitamin D deficiency 08/22/2019  . History of thrombosis 08/22/2019  . Chronic fatigue 07/17/2019  . Osteoporosis 05/12/2019  . STEC (Shiga toxin-producing Escherichia coli) infection 04/06/2019  . Diverticulitis 04/06/2019  . Thrombocytopenia ([place]) 01/23/2019  . Leukopenia 01/23/2019  . Lymphedema of both lower extremities 01/16/2019  . Polyarthralgia 10/14/2018  . Headache disorder 10/14/2018  . Chronic daily headache 10/11/2018  . COPD (chronic obstructive pulmonary disease) ([place]) 10/11/2018  . Thoracic aortic atherosclerosis ([place]) 10/11/2018  . Multiple episodes of deep venous thrombosis ([place]) 09/17/2018  . IBS (irritable bowel syndrome) 09/17/2018  . Healthcare maintenance 09/17/2018  . Primary osteoarthritis of right hip 09/03/2018  . Presence of right hip implant 09/03/2018  . Morbid obesity with BMI of  45.0-49.9, adult ([place]) 08/23/2018  . Shortness of breath 08/22/2018  . History of ductal carcinoma in situ (DCIS) of breast 07/20/2018  . Aromatase inhibitor use 07/20/2018  . Abnormal findings on auscultation 07/16/2018  . Encounter for long-term (current) use of high-risk medication 07/16/2018  . Fibromyalgia 07/16/2018  . Other forms of systemic lupus erythematosus ([place]) 07/16/2018  . Other osteoporosis without current pathological fracture 07/16/2018  . Seronegative arthritis 07/16/2018   Karl Luke PT, DPT Netta Corrigan 08/24/2020, 2:01 PM  [place] MAIN [facility] [place]  [place], [place], [address] Phone: [phone]   Fax:  [phone]  Name: Victoria [name] MRN: [id number] Date of Birth: 1960-12-10

## 2020-08-25 ENCOUNTER — Encounter: Payer: Self-pay | Admitting: Student in an Organized Health Care Education/Training Program

## 2020-08-26 ENCOUNTER — Ambulatory Visit: Payer: Medicare Other | Admitting: Physical Therapy

## 2020-08-27 ENCOUNTER — Ambulatory Visit: Payer: Medicare Other

## 2020-08-27 ENCOUNTER — Other Ambulatory Visit: Payer: Self-pay

## 2020-08-27 DIAGNOSIS — R269 Unspecified abnormalities of gait and mobility: Secondary | ICD-10-CM

## 2020-08-27 DIAGNOSIS — M79604 Pain in right leg: Secondary | ICD-10-CM

## 2020-08-27 DIAGNOSIS — R2681 Unsteadiness on feet: Secondary | ICD-10-CM | POA: Diagnosis not present

## 2020-08-27 DIAGNOSIS — M6281 Muscle weakness (generalized): Secondary | ICD-10-CM

## 2020-08-27 NOTE — Therapy (Signed)
[place] MAIN [facility] SERVICES [address] [place], [place], [address] Phone: [phone]   Fax:  [phone]  Physical Therapy Treatment  Patient Details  Name: Victoria [name] MRN: [id number] Date of Birth: 1961-06-14 Referring Provider (PT): Vladimir Crofts, MD   Encounter Date: 08/27/2020   PT End of Session - 08/27/20 1123    Visit Number 3    Number of Visits 17    Date for PT Re-Evaluation 10/12/20    PT Start Time 0948    PT Stop Time 1021    PT Time Calculation (min) 33 min    Equipment Utilized During Treatment Gait belt    Activity Tolerance Patient tolerated treatment well;Patient limited by pain    Behavior During Therapy [facility] for tasks assessed/performed           Past Medical History:  Diagnosis Date  . Allergy   . Asthma   . Cancer ([place])    Breast Left, DCIS 2018  . Collagen vascular disease (HCC)    Lupus  . COPD (chronic obstructive pulmonary disease) ([place])   . Diverticulitis   . Fibromyalgia   . GERD (gastroesophageal reflux disease)   . Hearing impaired person, left   . IBS (irritable bowel syndrome)   . Lupus ([place])   . Mitral valve disorder   . MS (multiple sclerosis) ([place])   . Neuromuscular disorder ([place])   . Osteoarthritis   . Osteoporosis 05/12/2019  . Personal history of radiation therapy 2017   left   . Rheumatoid arthritis ([place])   . Seizures ([place])     Past Surgical History:  Procedure Laterality Date  . ABDOMINAL HYSTERECTOMY    . APPENDECTOMY    . BREAST EXCISIONAL BIOPSY Left 2017   lumpectomy with radation  . BREAST EXCISIONAL BIOPSY Left 2004   neg surgical bx   . BREAST LUMPECTOMY Left 2017   DCIS  . BREAST SURGERY    . CHOLECYSTECTOMY    . COLONOSCOPY WITH PROPOFOL N/A 07/07/2019   Procedure: COLONOSCOPY WITH PROPOFOL;  Surgeon: Toledo, Benay Pike, MD;  Location: [facility] ENDOSCOPY;  Service: Gastroenterology;  Laterality: N/A;  . ESOPHAGOGASTRODUODENOSCOPY (EGD) WITH PROPOFOL N/A  07/07/2019   Procedure: ESOPHAGOGASTRODUODENOSCOPY (EGD) WITH PROPOFOL;  Surgeon: Toledo, Benay Pike, MD;  Location: [facility] ENDOSCOPY;  Service: Gastroenterology;  Laterality: N/A;  . PATENT DUCTUS ARTERIOUS REPAIR    . RECONSTRUCTION TENDON PULLEY HAND Left   . TOTAL HIP ARTHROPLASTY Right     There were no vitals filed for this visit.   Subjective Assessment - 08/27/20 1120    Subjective Pt arrives 15 minutes after start of appointment time and reports she was delayed due to walking her dog.  Pt reports pain as 8/10 this morning and that her pain has been high lately. Pt says she has upcoming appointment next Tuesday with her physician to discuss her high pain levels.    Pertinent History Per chart: PMH includes: Lupus, seronegative arthritis, fibromyalgia, osteoporosis, breast cancer(radiation), morbid obesity, DVTs, IBS,COPD, chronic migraine, lymphedema, thrombocytopenia. Seizures, RA, MS? back pain that radiates down into both hips and legs. She has bilateral foot numbness. Toes will turn purple.    Limitations Sitting;Walking    How long can you sit comfortably? Pt says sitting limited to 20 minutes, worsened with spinal extension and improved with flexion.    How long can you stand comfortably? no reported difficulty    How long can you walk comfortably? Pt reports difficulty with prolonged walking  Diagnostic tests MRI done on 05/10/2020 (see physician note)    Patient Stated Goals Pt would like to increae LE strength and decrease pain in order to walk her dog    Currently in Pain? Yes    Pain Score 8     Pain Location Leg    Pain Orientation Right;Left    Pain Onset More than a month ago           Therapeutic Exercise:  Seated marches - 2x20, pt reports back pain with marches Seated LAQ - 2x15 reps each leg Seated hip abduction with RTB 1x12 pt rates hard Seated hip abduction with manual resistance 2x12 pt rates exercise as "medium" Standing heel rases 3x15 with BUE  support Sit<>stands with UUE assist 1x5, 1x8 Standing hip abduction with B UE support - 1x10 B LEs pt reports exercise as "hard" and R LE pain so exercise discontinued.      PT Education - 08/27/20 1123    Education Details Pt educated on Economist and therex    Person(s) Educated Patient    Methods Explanation;Demonstration;Verbal cues;Tactile cues    Comprehension Verbalized understanding;Returned demonstration            PT Short Term Goals - 08/17/20 1331      PT SHORT TERM GOAL #1   Title Patient will be independent in home exercise program to improve strength/mobility for better functional independence with ADLs.    Baseline 08/17/2020 Pt issued HEP    Period Weeks    Status New    Target Date 09/14/20             PT Long Term Goals - 08/17/20 1332      PT LONG TERM GOAL #1   Title Patient will increase FOTO score to equal to or greater than 60 to demonstrate statistically significant improvement in mobility and quality of life.    Baseline 08/17/2020 Pt score 55 (risk-adjusted 50)    Time 8    Period Weeks    Status New    Target Date 10/12/20      PT LONG TERM GOAL #2   Title Pt will increase MMT scores by at least 1/2 point to indicate increased B LE strength for performing functional activities.    Baseline 08/17/2020 MMT L/R hip flexion 3/5, 3/5; hip abduction 3/5, 3+/5; hip adduction 2+/5, 3-/5; knee flexion 4-/5, 3+/5, knee extension 4-/5, 3/5; ankle 3/5 B; ankle plantarflexion 4/5 B    Time 8    Period Weeks    Status New    Target Date 10/12/20      PT LONG TERM GOAL #3   Title Patient will increase 10 meter walk test to >1.56m/s as to improve gait speed for better community ambulation and to reduce fall risk.    Baseline 08/17/2020 0.65 m/s no AD    Time 8    Period Weeks    Status New    Target Date 10/12/20      PT LONG TERM GOAL #4   Title Patient (< 18 years old) will complete five times sit to stand test in < 10 seconds indicating an  increased LE strength and improved balance.    Baseline 08/17/2020 5xSTS completed in 14.98 sec    Time 8    Period Weeks    Status New    Target Date 10/12/20      PT LONG TERM GOAL #5   Title Patient will increase Berg Balance score by >  6 points to demonstrate decreased fall risk during functional activities.    Baseline 08/17/2020 Berg score 50/56    Time 8    Period Weeks    Target Date 10/12/20                 Plan - 08/27/20 1124    Clinical Impression Statement PT session limited today due to pt arriving approximately 15 minutes after start of appointment time. Pt without reports of increased sx with majority of therex today, except with seated marches and standing hip abduction. Pt still quickly fatigues with therex. PT educated pt on exercise modification and to perform therex within pain tolerance range. Pt will benefit from further skilled therapy to decrease pain and to increase B LE strength and mobility in order to improve QOL.    Personal Factors and Comorbidities Comorbidity 1;Comorbidity 2;Comorbidity 3+;Fitness    Comorbidities pertinent: hx of thrombosis, chronic fatigue, osteoporisis, HA disorder, polyathralgia, COPD, IBS, Fibromyalgia, lupus    Examination-Activity Limitations Stairs;Transfers;Locomotion Level;Sleep;Squat;Stand;Reach Overhead    Examination-Participation Restrictions Community Activity;Meal Prep;Laundry;Shop;Volunteer;Yard Work;Interpersonal Relationship    Stability/Clinical Decision Making Evolving/Moderate complexity    Rehab Potential Good    PT Frequency 2x / week    PT Duration 8 weeks    PT Treatment/Interventions ADLs/Self Care Home Management;Electrical Stimulation;Moist Heat;DME Instruction;Gait training;Stair training;Cryotherapy;Ultrasound;Functional mobility training;Neuromuscular re-education;Balance training;Therapeutic exercise;Therapeutic activities;Patient/family education;Orthotic Fit/Training;Manual techniques;Passive range of  motion;Energy conservation;Taping;Canalith Repostioning;Vestibular;Visual/perceptual remediation/compensation    PT Next Visit Plan Initiate gait, and progress strength and balance exercises    PT Home Exercise Plan see HEP (medbridge link)    Consulted and Agree with Plan of Care Patient           Patient will benefit from skilled therapeutic intervention in order to improve the following deficits and impairments:  Abnormal gait,Decreased activity tolerance,Decreased balance,Decreased endurance,Decreased coordination,Decreased mobility,Decreased strength,Decreased range of motion,Pain,Improper body mechanics,Postural dysfunction,Impaired flexibility,Difficulty walking,Cardiopulmonary status limiting activity,Decreased knowledge of use of DME,Obesity  Visit Diagnosis: Muscle weakness (generalized)  Unsteadiness on feet  Gait abnormality  Pain of right lower extremity     Problem List Patient Active Problem List   Diagnosis Date Noted  . Vitamin D deficiency 08/22/2019  . History of thrombosis 08/22/2019  . Chronic fatigue 07/17/2019  . Osteoporosis 05/12/2019  . STEC (Shiga toxin-producing Escherichia coli) infection 04/06/2019  . Diverticulitis 04/06/2019  . Thrombocytopenia ([place]) 01/23/2019  . Leukopenia 01/23/2019  . Lymphedema of both lower extremities 01/16/2019  . Polyarthralgia 10/14/2018  . Headache disorder 10/14/2018  . Chronic daily headache 10/11/2018  . COPD (chronic obstructive pulmonary disease) ([place]) 10/11/2018  . Thoracic aortic atherosclerosis ([place]) 10/11/2018  . Multiple episodes of deep venous thrombosis ([place]) 09/17/2018  . IBS (irritable bowel syndrome) 09/17/2018  . Healthcare maintenance 09/17/2018  . Primary osteoarthritis of right hip 09/03/2018  . Presence of right hip implant 09/03/2018  . Morbid obesity with BMI of 45.0-49.9, adult ([place]) 08/23/2018  . Shortness of breath 08/22/2018  . History of ductal carcinoma in situ (DCIS) of breast  07/20/2018  . Aromatase inhibitor use 07/20/2018  . Abnormal findings on auscultation 07/16/2018  . Encounter for long-term (current) use of high-risk medication 07/16/2018  . Fibromyalgia 07/16/2018  . Other forms of systemic lupus erythematosus ([place]) 07/16/2018  . Other osteoporosis without current pathological fracture 07/16/2018  . Seronegative arthritis 07/16/2018    Ricard Dillon PT, DPT  08/27/2020, 11:35 AM  [place] MAIN [facility] SERVICES [address] [place], [place], [address] Phone: [phone]  Fax:  [phone]  Name: Victoria [name] MRN: [id number] Date of Birth: May 11, 1961

## 2020-08-30 ENCOUNTER — Ambulatory Visit: Payer: Medicare Other

## 2020-08-30 ENCOUNTER — Other Ambulatory Visit: Payer: Self-pay

## 2020-08-30 DIAGNOSIS — M79604 Pain in right leg: Secondary | ICD-10-CM

## 2020-08-30 DIAGNOSIS — R2681 Unsteadiness on feet: Secondary | ICD-10-CM | POA: Diagnosis not present

## 2020-08-30 DIAGNOSIS — R269 Unspecified abnormalities of gait and mobility: Secondary | ICD-10-CM

## 2020-08-30 DIAGNOSIS — M6281 Muscle weakness (generalized): Secondary | ICD-10-CM

## 2020-08-30 NOTE — Therapy (Signed)
[place] MAIN [facility] SERVICES [address] [place], [place], [address] Phone: [phone]   Fax:  [phone]  Physical Therapy Treatment  Patient Details  Name: Victoria [name] MRN: [id number] Date of Birth: July 29, 1961 Referring Provider (PT): Vladimir Crofts, MD   Encounter Date: 08/30/2020   PT End of Session - 08/30/20 1610    Visit Number 4    Number of Visits 17    Date for PT Re-Evaluation 10/12/20    PT Start Time 1307    PT Stop Time 1346    PT Time Calculation (min) 39 min    Equipment Utilized During Treatment Gait belt    Activity Tolerance Patient tolerated treatment well;Patient limited by pain    Behavior During Therapy [facility] for tasks assessed/performed           Past Medical History:  Diagnosis Date  . Allergy   . Asthma   . Cancer ([place])    Breast Left, DCIS 2018  . Collagen vascular disease (HCC)    Lupus  . COPD (chronic obstructive pulmonary disease) ([place])   . Diverticulitis   . Fibromyalgia   . GERD (gastroesophageal reflux disease)   . Hearing impaired person, left   . IBS (irritable bowel syndrome)   . Lupus ([place])   . Mitral valve disorder   . MS (multiple sclerosis) ([place])   . Neuromuscular disorder ([place])   . Osteoarthritis   . Osteoporosis 05/12/2019  . Personal history of radiation therapy 2017   left   . Rheumatoid arthritis ([place])   . Seizures ([place])     Past Surgical History:  Procedure Laterality Date  . ABDOMINAL HYSTERECTOMY    . APPENDECTOMY    . BREAST EXCISIONAL BIOPSY Left 2017   lumpectomy with radation  . BREAST EXCISIONAL BIOPSY Left 2004   neg surgical bx   . BREAST LUMPECTOMY Left 2017   DCIS  . BREAST SURGERY    . CHOLECYSTECTOMY    . COLONOSCOPY WITH PROPOFOL N/A 07/07/2019   Procedure: COLONOSCOPY WITH PROPOFOL;  Surgeon: Toledo, Benay Pike, MD;  Location: [facility] ENDOSCOPY;  Service: Gastroenterology;  Laterality: N/A;  . ESOPHAGOGASTRODUODENOSCOPY (EGD) WITH PROPOFOL N/A  07/07/2019   Procedure: ESOPHAGOGASTRODUODENOSCOPY (EGD) WITH PROPOFOL;  Surgeon: Toledo, Benay Pike, MD;  Location: [facility] ENDOSCOPY;  Service: Gastroenterology;  Laterality: N/A;  . PATENT DUCTUS ARTERIOUS REPAIR    . RECONSTRUCTION TENDON PULLEY HAND Left   . TOTAL HIP ARTHROPLASTY Right     There were no vitals filed for this visit.   Subjective Assessment - 08/30/20 1310    Subjective Pt reports her legs feel "very heavy," and pt reports she has had this feeling all weekend. Pt reports 8/10 constant pain. Pt reports slight increase in pain after previous session. Pt reports dr appointment tomorrow.    Pertinent History Per chart: PMH includes: Lupus, seronegative arthritis, fibromyalgia, osteoporosis, breast cancer(radiation), morbid obesity, DVTs, IBS,COPD, chronic migraine, lymphedema, thrombocytopenia. Seizures, RA, MS? back pain that radiates down into both hips and legs. She has bilateral foot numbness. Toes will turn purple.    Limitations Sitting;Walking    How long can you sit comfortably? Pt says sitting limited to 20 minutes, worsened with spinal extension and improved with flexion.    How long can you stand comfortably? no reported difficulty    How long can you walk comfortably? Pt reports difficulty with prolonged walking    Diagnostic tests MRI done on 05/10/2020 (see physician note)    Patient  Stated Goals Pt would like to increae LE strength and decrease pain in order to walk her dog    Currently in Pain? Yes    Pain Score 8     Pain Location Leg    Pain Orientation Left;Right    Pain Onset More than a month ago           Therapeutic Exercise:   Standing hip abduction with B UE support - 3x12 B LEs. VC provided for technique/reduce lateral lean Standing heel rases 2x15 with BUE support, demonstration provided for technique Standing single leg heel raise 1x8 B LEs, pt does report brief groin pain with exercise that resolved when pt took seated rest break.  Seated hip  flexion isometrics 2x10 with 3 sec hold B LEs Seated adductor squeezes with green ball 3x15  Neuromuscular Re-ed:  Standing on foam WBOS - 2x30 sec, CGA Standing on foam WBOS EC 2x30 sec, CGA increase in postural sway but no LOB Standing on foam NBOS - 2x30 sec, CGA Standing on foam NBOS EC, CGA - 4x30 sec increase in postural sway/no LOB Obstacle course with navigating around obstacles, stepping over hurdle, and stepping on/off compliant surface 6x, CGA, pt knocked over one cone, demonstrating increased variability in BOS throughout, and with brief LOB 3x stepping over hurdle where pt able to regain balance with CGA from PT, stepping strategy and mid-guard.  Stepping over hurdles at bar 1x6, with UUE support on bar, CGA. Pt with improved foot clearance with successive reps.      PT Education - 08/30/20 1610    Education Details Pt educated on technique with balance exercises/obstacle course    Person(s) Educated Patient    Methods Explanation;Demonstration;Verbal cues    Comprehension Verbalized understanding;Returned demonstration            PT Short Term Goals - 08/17/20 1331      PT SHORT TERM GOAL #1   Title Patient will be independent in home exercise program to improve strength/mobility for better functional independence with ADLs.    Baseline 08/17/2020 Pt issued HEP    Period Weeks    Status New    Target Date 09/14/20             PT Long Term Goals - 08/17/20 1332      PT LONG TERM GOAL #1   Title Patient will increase FOTO score to equal to or greater than 60 to demonstrate statistically significant improvement in mobility and quality of life.    Baseline 08/17/2020 Pt score 55 (risk-adjusted 50)    Time 8    Period Weeks    Status New    Target Date 10/12/20      PT LONG TERM GOAL #2   Title Pt will increase MMT scores by at least 1/2 point to indicate increased B LE strength for performing functional activities.    Baseline 08/17/2020 MMT L/R hip flexion  3/5, 3/5; hip abduction 3/5, 3+/5; hip adduction 2+/5, 3-/5; knee flexion 4-/5, 3+/5, knee extension 4-/5, 3/5; ankle 3/5 B; ankle plantarflexion 4/5 B    Time 8    Period Weeks    Status New    Target Date 10/12/20      PT LONG TERM GOAL #3   Title Patient will increase 10 meter walk test to >1.73m/s as to improve gait speed for better community ambulation and to reduce fall risk.    Baseline 08/17/2020 0.65 m/s no AD    Time 8  Period Weeks    Status New    Target Date 10/12/20      PT LONG TERM GOAL #4   Title Patient (< 70 years old) will complete five times sit to stand test in < 10 seconds indicating an increased LE strength and improved balance.    Baseline 08/17/2020 5xSTS completed in 14.98 sec    Time 8    Period Weeks    Status New    Target Date 10/12/20      PT LONG TERM GOAL #5   Title Patient will increase Berg Balance score by > 6 points to demonstrate decreased fall risk during functional activities.    Baseline 08/17/2020 Berg score 50/56    Time 8    Period Weeks    Target Date 10/12/20                 Plan - 08/30/20 1635    Clinical Impression Statement Pt with improved tolerance for therex this session without reports of fatigue. Pt also only with one instance of brief pain reported in groin with single leg heel raises that resolved with seated rest break. Pt demonstrated increased variability in BOS with obstacle course with 3 instances of LOB when stepping over hurdles where pt regained balance with CGA from PT, stepping strategy, and mid-guard. This indicates impairment of dynamic balance. Pt will benefit from further skilled therapy to improve balance, B LE strength and pain in order to increase safety with functional activities and improve QOL.    Personal Factors and Comorbidities Comorbidity 1;Comorbidity 2;Comorbidity 3+;Fitness    Comorbidities pertinent: hx of thrombosis, chronic fatigue, osteoporisis, HA disorder, polyathralgia, COPD, IBS,  Fibromyalgia, lupus    Examination-Activity Limitations Stairs;Transfers;Locomotion Level;Sleep;Squat;Stand;Reach Overhead    Examination-Participation Restrictions Community Activity;Meal Prep;Laundry;Shop;Volunteer;Yard Work;Interpersonal Relationship    Stability/Clinical Decision Making Evolving/Moderate complexity    Rehab Potential Good    PT Frequency 2x / week    PT Duration 8 weeks    PT Treatment/Interventions ADLs/Self Care Home Management;Electrical Stimulation;Moist Heat;DME Instruction;Gait training;Stair training;Cryotherapy;Ultrasound;Functional mobility training;Neuromuscular re-education;Balance training;Therapeutic exercise;Therapeutic activities;Patient/family education;Orthotic Fit/Training;Manual techniques;Passive range of motion;Energy conservation;Taping;Canalith Repostioning;Vestibular;Visual/perceptual remediation/compensation    PT Next Visit Plan Obstacle course with focus on stepping over obstacles and navigating around cones    PT Home Exercise Plan see HEP (medbridge link)    Consulted and Agree with Plan of Care Patient           Patient will benefit from skilled therapeutic intervention in order to improve the following deficits and impairments:  Abnormal gait,Decreased activity tolerance,Decreased balance,Decreased endurance,Decreased coordination,Decreased mobility,Decreased strength,Decreased range of motion,Pain,Improper body mechanics,Postural dysfunction,Impaired flexibility,Difficulty walking,Cardiopulmonary status limiting activity,Decreased knowledge of use of DME,Obesity  Visit Diagnosis: Muscle weakness (generalized)  Unsteadiness on feet  Gait abnormality  Pain of right lower extremity     Problem List Patient Active Problem List   Diagnosis Date Noted  . Vitamin D deficiency 08/22/2019  . History of thrombosis 08/22/2019  . Chronic fatigue 07/17/2019  . Osteoporosis 05/12/2019  . STEC (Shiga toxin-producing Escherichia coli)  infection 04/06/2019  . Diverticulitis 04/06/2019  . Thrombocytopenia ([place]) 01/23/2019  . Leukopenia 01/23/2019  . Lymphedema of both lower extremities 01/16/2019  . Polyarthralgia 10/14/2018  . Headache disorder 10/14/2018  . Chronic daily headache 10/11/2018  . COPD (chronic obstructive pulmonary disease) ([place]) 10/11/2018  . Thoracic aortic atherosclerosis ([place]) 10/11/2018  . Multiple episodes of deep venous thrombosis ([place]) 09/17/2018  . IBS (irritable bowel syndrome) 09/17/2018  . Healthcare maintenance 09/17/2018  .  Primary osteoarthritis of right hip 09/03/2018  . Presence of right hip implant 09/03/2018  . Morbid obesity with BMI of 45.0-49.9, adult ([place]) 08/23/2018  . Shortness of breath 08/22/2018  . History of ductal carcinoma in situ (DCIS) of breast 07/20/2018  . Aromatase inhibitor use 07/20/2018  . Abnormal findings on auscultation 07/16/2018  . Encounter for long-term (current) use of high-risk medication 07/16/2018  . Fibromyalgia 07/16/2018  . Other forms of systemic lupus erythematosus ([place]) 07/16/2018  . Other osteoporosis without current pathological fracture 07/16/2018  . Seronegative arthritis 07/16/2018    Ricard Dillon PT, DPT  08/30/2020, 4:47 PM  [place] MAIN [facility] SERVICES [address] [place], [place], [address] Phone: [phone]   Fax:  [phone]  Name: Victoria [name] MRN: [id number] Date of Birth: 1961-02-16

## 2020-08-31 ENCOUNTER — Encounter: Payer: Self-pay | Admitting: Student in an Organized Health Care Education/Training Program

## 2020-08-31 ENCOUNTER — Ambulatory Visit: Payer: Medicare Other | Admitting: Physical Therapy

## 2020-08-31 ENCOUNTER — Ambulatory Visit
Payer: Medicare Other | Attending: Student in an Organized Health Care Education/Training Program | Admitting: Student in an Organized Health Care Education/Training Program

## 2020-08-31 ENCOUNTER — Other Ambulatory Visit: Payer: Self-pay

## 2020-08-31 VITALS — BP 123/52 | HR 68 | Temp 97.2°F | Resp 18 | Ht <= 58 in | Wt 185.0 lb

## 2020-08-31 DIAGNOSIS — M5416 Radiculopathy, lumbar region: Secondary | ICD-10-CM | POA: Insufficient documentation

## 2020-08-31 DIAGNOSIS — M797 Fibromyalgia: Secondary | ICD-10-CM | POA: Diagnosis present

## 2020-08-31 DIAGNOSIS — M1380 Other specified arthritis, unspecified site: Secondary | ICD-10-CM | POA: Diagnosis not present

## 2020-08-31 DIAGNOSIS — G894 Chronic pain syndrome: Secondary | ICD-10-CM | POA: Insufficient documentation

## 2020-08-31 DIAGNOSIS — M2550 Pain in unspecified joint: Secondary | ICD-10-CM | POA: Diagnosis present

## 2020-08-31 DIAGNOSIS — M1611 Unilateral primary osteoarthritis, right hip: Secondary | ICD-10-CM | POA: Insufficient documentation

## 2020-08-31 MED ORDER — HYDROCODONE-ACETAMINOPHEN 7.5-325 MG PO TABS: 1.0000 | ORAL_TABLET | Freq: Three times a day (TID) | ORAL | 0 refills | Status: DC | PRN

## 2020-08-31 NOTE — Patient Instructions (Signed)
1. Please call pharmacy and cancel Hydrocodone rx for next 2 months at 7.5 mg BID prn 2. New Rx sent in for 2 months starting 2/1 for Hydrocodone 7.5 mg TID prn

## 2020-08-31 NOTE — Progress Notes (Signed)
PROVIDER NOTE: Information contained herein reflects review and annotations entered in association with encounter. Interpretation of such information and data should be left to medically-trained personnel. Information provided to patient can be located elsewhere in the medical record under "Patient Instructions". Document created using STT-dictation technology, any transcriptional errors that may result from process are unintentional.    Patient: Victoria [name]  Service Category: E/M  Provider: Gillis Santa, MD  DOB: Sep 11, 1960  DOS: 08/31/2020  Specialty: Interventional Pain Management  MRN: [id number]  Setting: Ambulatory outpatient  PCP: Kirk Ruths, MD  Type: Established Patient    Referring Provider: Kirk Ruths, MD  Location: Office  Delivery: Face-to-face     HPI  Ms.  [name], a 60 y.o. year old female, is here today because of her Chronic pain syndrome [G89.4]. [name] primary complain today is Pain (Generalized body pain) and Leg Pain (Bilat neuropathy) Last encounter: My last encounter with her was on 06/28/2020. Pertinent problems: [name] has COPD (chronic obstructive pulmonary disease) ([place]); Polyarthralgia; Morbid obesity with BMI of 45.0-49.9, adult ([place]); Osteoporosis; Chronic fatigue; and History of thrombosis on their pertinent problem list. Pain Assessment: Severity of Chronic pain,Neuropathic pain is reported as a 9 /10. Location: Other (Comment) (generalized body pain and neuropathy)  / . Onset: More than a month ago. Quality:  (back-sharp; legs-dull achy, thighs-sharp). Timing: Constant. Modifying factor(s):  Marland Kitchen Vitals:  height is 4' 7" (1.397 m) and weight is 185 lb (83.9 kg). Her temporal temperature is 97.2 F (36.2 C) (abnormal). Her blood pressure is 123/52 (abnormal) and her pulse is 68. Her respiration is 18 and oxygen saturation is 100%.   Reason for encounter: worsening of previously known (established) problem    [name] follows up today  for worsening bilateral leg pain.  Since her last visit with me, she has been evaluated by neurology.  An EEG was done but did not show any evidence of seizure activity.  She has also started physical therapy this month.  She states that her pain is increased since starting physical therapy which I told her can sometimes happen as her muscles and joints are adjusting to the exercises but I encouraged her to continue as this would be beneficial for her in the long run.  She has an upcoming appointment with a musculoskeletal specialist next month.  For increased pain, patient is requesting to increase her Norco up to 3 times a day as she states that her pain is becoming difficult to manage especially as she is participating in physical therapy.  She is on Plaquenil for her lupus which I think is a significant source of her polymyalgias and polyarthralgias.  I informed her that opioid analgesics are not very effective for musculoskeletal pain however we could consider increasing to 3 times a day but no further dose escalation beyond this.  Patient endorsed understanding.  We will call her pharmacy and cancel her previous prescriptions at 7.5 mg twice daily and I will send in a new prescription 7.5 mg 3 times daily that she can fill on 09/07/2020.  Pharmacotherapy Assessment   08/12/2020  06/28/2020   1  Hydrocodone-Acetamin 7.5-325  60.00  30  [place]  [id number]  Gib (4800)  0/0  15.00 MME  Medicare  [place]      Analgesic: Norco 7.5 mg twice daily as needed, quantity 60/month; MME equals 15    Monitoring: [place] PMP: PDMP reviewed during this encounter.       Pharmacotherapy: No side-effects or  adverse reactions reported. Compliance: No problems identified. Effectiveness: Clinically acceptable.  Rise Patience, RN  08/31/2020  8:58 AM  Sign when Signing Visit Nursing Pain Medication Assessment:  Safety precautions to be maintained throughout the outpatient stay will include: orient to surroundings, keep bed in low  position, maintain call bell within reach at all times, provide assistance with transfer out of bed and ambulation.  Medication Inspection Compliance: Pill count conducted under aseptic conditions, in front of the patient. Neither the pills nor the bottle was removed from the patient's sight at any time. Once count was completed pills were immediately returned to the patient in their original bottle.  Medication: Hydrocodone/APAP Pill/Patch Count: 22 of 60 pills remain Pill/Patch Appearance: Markings consistent with prescribed medication Bottle Appearance: Standard pharmacy container. Clearly labeled. Filled Date: 1 / 06 / 22 Last Medication intake:  Yesterday    UDS:  Summary  Date Value Ref Range Status  10/06/2019 Note  Final    Comment:    ==================================================================== [facility] 13 (MW) ==================================================================== Test                             Result       Flag       Units Drug Present not Declared for Prescription Verification   Hydrocodone                    67           UNEXPECTED ng/mg creat   Hydromorphone                  50           UNEXPECTED ng/mg creat   Norhydrocodone                 99           UNEXPECTED ng/mg creat    Sources of hydrocodone include scheduled prescription medications.    Hydromorphone and norhydrocodone are expected metabolites of    hydrocodone. Hydromorphone is also available as a scheduled    prescription medication. ==================================================================== Test                      Result    Flag   Units      Ref Range   Creatinine              132              mg/dL      >=20 ==================================================================== Declared Medications:  The flagging and interpretation on this report are based on the  following declared medications.  Unexpected results may arise from  inaccuracies in the declared  medications.  **Note: The testing scope of this panel does not include the  following reported medications:  Albuterol  Albuterol (Combivent)  Atorvastatin  Benzonatate  Budesonide (Symbicort)  Cyclobenzaprine  Dabigatran (Pradaxa)  Folic Acid  Formoterol (Symbicort)  Gabapentin  Hydroxychloroquine  Ibuprofen  Ipratropium (Combivent)  Leflunomide (Arava)  Letrozole (Femara)  Methotrexate  Omeprazole  Ondansetron  Potassium (Klor-Con)  Prednisone  Tiotropium  Topiramate  Trazodone ==================================================================== For clinical consultation, please call [phone]. ====================================================================      ROS  Constitutional: Denies any fever or chills Gastrointestinal: No reported hemesis, hematochezia, vomiting, or acute GI distress Musculoskeletal: Denies any acute onset joint swelling, redness, loss of ROM, or weakness Neurological: No reported episodes of acute onset apraxia, aphasia, dysarthria,  agnosia, amnesia, paralysis, loss of coordination, or loss of consciousness  Medication Review  HYDROcodone-acetaminophen, Tiotropium Bromide Monohydrate, albuterol, atorvastatin, azelastine, benzonatate, budesonide-formoterol, cyclobenzaprine, dabigatran, fluticasone, gabapentin, hydroxychloroquine, letrozole, methotrexate, omeprazole, ondansetron, topiramate, and traZODone  History Review  Allergy: Ms. Terada is allergic to other, sodium hypochlorite, tape, codeine, flagyl [metronidazole], levofloxacin, and sulfa antibiotics. Drug: Ms. Briones  reports no history of drug use. Alcohol:  reports current alcohol use. Tobacco:  reports that she quit smoking about 21 years ago. Her smoking use included cigarettes. She has a 30.00 pack-year smoking history. She has never used smokeless tobacco. Social: Ms. Alderete  reports that she quit smoking about 21 years ago. Her smoking use included cigarettes. She has  a 30.00 pack-year smoking history. She has never used smokeless tobacco. She reports current alcohol use. She reports that she does not use drugs. Medical:  has a past medical history of Allergy, Asthma, Cancer ([place]), Collagen vascular disease ([place]), COPD (chronic obstructive pulmonary disease) ([place]), Diverticulitis, Fibromyalgia, GERD (gastroesophageal reflux disease), Hearing impaired person, left, IBS (irritable bowel syndrome), Lupus ([place]), Mitral valve disorder, MS (multiple sclerosis) ([place]), Neuromuscular disorder ([place]), Osteoarthritis, Osteoporosis (05/12/2019), Personal history of radiation therapy (2017), Rheumatoid arthritis ([place]), and Seizures ([place]). Surgical: Ms. Yera  has a past surgical history that includes Breast surgery; Abdominal hysterectomy; Appendectomy; Cholecystectomy; Patent ductus arterious repair; Total hip arthroplasty (Right); Reconstruction tendon pulley hand (Left); Esophagogastroduodenoscopy (egd) with propofol (N/A, 07/07/2019); Colonoscopy with propofol (N/A, 07/07/2019); Breast lumpectomy (Left, 2017); Breast excisional biopsy (Left, 2017); and Breast excisional biopsy (Left, 2004). Family: family history includes Arthritis in her brother; COPD in her mother; Diabetes in her brother; Stroke in her brother and father.  Laboratory Chemistry Profile   Renal Lab Results  Component Value Date   BUN 16 08/19/2020   CREATININE 0.76 08/19/2020   GFRAA >60 02/17/2020   GFRNONAA >60 08/19/2020     Hepatic Lab Results  Component Value Date   AST 24 08/19/2020   ALT 28 08/19/2020   ALBUMIN 4.1 08/19/2020   ALKPHOS 64 08/19/2020     Electrolytes Lab Results  Component Value Date   NA 137 08/19/2020   K 3.7 08/19/2020   CL 106 08/19/2020   CALCIUM 9.1 08/19/2020     Bone No results found for: VD25OH, VD125OH2TOT, XT0626RS8, NI6270JJ0, 25OHVITD1, 25OHVITD2, 25OHVITD3, TESTOFREE, TESTOSTERONE   Inflammation (CRP: Acute Phase) (ESR: Chronic Phase) Lab Results   Component Value Date   CRP <0.8 09/12/2018   ESRSEDRATE 16 09/12/2018       Note: Above Lab results reviewed.  Recent Imaging Review  MR BRAIN W WO CONTRAST CLINICAL DATA:  Fluid  EXAM: MRI HEAD WITHOUT AND WITH CONTRAST  TECHNIQUE: Multiplanar, multiecho pulse sequences of the brain and surrounding structures were obtained without and with intravenous contrast.  CONTRAST:  52m GADAVIST GADOBUTROL 1 MMOL/ML IV SOLN  COMPARISON:  Head CT June 04, 2018  FINDINGS: Brain: No acute infarction, hemorrhage, hydrocephalus, extra-axial collection or mass lesion.  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 Scattered and confluent foci of T2 hyperintensity are seen within the white matter of the cerebral hemispheres, nonspecific, more pronounced than expected for age.  No cerebellopontine angle mass or internal auditory canal lesion is demonstrated.Normal appearance of the 7th and 8th cranial nerves bilaterally.  No focus of abnormal contrast enhancement seen.  Vascular: Normal flow voids.  Skull and upper cervical spine: Normal marrow signal.  Sinuses/Orbits: Negative.  Other: None.  IMPRESSION: 1. No acute intracranial abnormality. 2. 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 3. Scattered and confluent foci of T2 hyperintensity within the white matter of the cerebral hemispheres, nonspecific, more pronounced than expected for age. Differential considerations include chronic microvascular ischemic changes, demyelinating disease and sequelae of prior infectious/inflammatory process.  Electronically Signed   By: Pedro Earls M.D.   On: 05/08/2020 12:10 Note: Reviewed        Physical Exam  General appearance: alert, cooperative and morbidly obese Mental status: Alert, oriented x 3 (person, place, & time)       Respiratory: No evidence of acute respiratory distress Eyes: PERLA Vitals: BP (!) 123/52   Pulse 68    Temp (!) 97.2 F (36.2 C) (Temporal)   Resp 18   Ht 4' 7" (1.397 m)   Wt 185 lb (83.9 kg)   SpO2 100%   BMI 43.00 kg/m  BMI: Estimated body mass index is 43 kg/m as calculated from the following:   Height as of this encounter: 4' 7" (1.397 m).   Weight as of this encounter: 185 lb (83.9 kg). Ideal: Patient must be at least 60 in tall to calculate ideal body weight   Lumbar Exam  Skin & Axial Inspection:No masses, redness, or swelling Alignment:Symmetrical Functional MVH:QION restricted ROMaffecting both sides Stability:No instability detected Muscle Tone/Strength:Functionally intact. No obvious neuro-muscular anomalies detected. Sensory (Neurological):Musculoskeletal pain patternand dermatomal pain pattern  Gait & Posture Assessment  Ambulation:Patient ambulates using a cane Gait:Limited. Using assistive device to ambulate Posture:Difficulty standing up straight, due to pain  Lower Extremity Exam    Side:Right lower extremity  Side:Left lower extremity  Stability:No instability observed  Stability:No instability observed  Skin & Extremity Inspection:Evidence of prior arthroplastic surgery  Skin & Extremity Inspection:Skin color, temperature, and hair growth are WNL. No peripheral edema or cyanosis. No masses, redness, swelling, asymmetry, or associated skin lesions. No contractures.  Functional GEX:BMWU restricted ROMfor all joints of the lower extremity   Functional XLK:GMWN restricted ROM   Muscle Tone/Strength:Functionally intact. No obvious neuro-muscular anomalies detected.  Muscle Tone/Strength:Functionally intact. No obvious neuro-muscular anomalies detected.  Sensory (Neurological):Dermatomal pain pattern  Sensory (Neurological):Unimpaired  DTR: Patellar:0: absent Achilles:deferred today Plantar:deferred today  DTR: Patellar:0: absent Achilles:deferred  today Plantar:deferred today  Palpation:No palpable anomalies  Palpation:No palpable anomalies     Assessment   Status Diagnosis  Having a Flare-up Persistent Persistent 1. Chronic pain syndrome   2. Lumbar radiculopathy   3. Primary osteoarthritis of right hip   4. Seronegative arthritis   5. Polyarthralgia   6. Fibromyalgia        Plan of Care   Ms. Amandamarie Feggins has a current medication list which includes the following long-term medication(s): albuterol, atorvastatin, azelastine, budesonide-formoterol, dabigatran, fluticasone, gabapentin, omeprazole, tiotropium bromide monohydrate, topiramate, and trazodone.  Pharmacotherapy (Medications Ordered): Meds ordered this encounter  Medications  . DISCONTD: HYDROcodone-acetaminophen (NORCO) 7.5-325 MG tablet    Sig: Take 1 tablet by mouth every 8 (eight) hours as needed for severe pain. Must last 30 days.    Dispense:  90 tablet    Refill:  0    Chronic Pain. (STOP Act - Not applicable). Fill one day early if closed on scheduled refill date.  Marland Kitchen DISCONTD: HYDROcodone-acetaminophen (NORCO) 7.5-325 MG tablet    Sig: Take 1 tablet by mouth every 8 (eight) hours as needed for severe pain. Must last 30 days.    Dispense:  90 tablet    Refill:  0    Chronic Pain. (STOP Act -  Not applicable). Fill one day early if closed on scheduled refill date.  Marland Kitchen HYDROcodone-acetaminophen (NORCO) 7.5-325 MG tablet    Sig: Take 1 tablet by mouth every 8 (eight) hours as needed for severe pain. Must last 30 days.    Dispense:  90 tablet    Refill:  0    Chronic Pain. (STOP Act - Not applicable). Fill one day early if closed on scheduled refill date.  Marland Kitchen HYDROcodone-acetaminophen (NORCO) 7.5-325 MG tablet    Sig: Take 1 tablet by mouth every 8 (eight) hours as needed for severe pain. Must last 30 days.    Dispense:  90 tablet    Refill:  0    Chronic Pain. (STOP Act - Not applicable). Fill one day early if closed on scheduled refill date.   Continue gabapentin as prescribed.  Continue care with rheumatologist for lupus.  Continue with physical therapy.   Follow-up plan:   Return in about 2 months (around 11/04/2020) for Medication Management, in person.   Recent Visits Date Type Provider Dept  06/28/20 Office Visit Gillis Santa, MD [facility]  Showing recent visits within past 90 days and meeting all other requirements Today's Visits Date Type Provider Dept  08/31/20 Office Visit Gillis Santa, MD [facility]  Showing today's visits and meeting all other requirements Future Appointments Date Type Provider Dept  11/02/20 Appointment Gillis Santa, MD [facility]  Showing future appointments within next 90 days and meeting all other requirements  I discussed the assessment and treatment plan with the patient. The patient was provided an opportunity to ask questions and all were answered. The patient agreed with the plan and demonstrated an understanding of the instructions.  Patient advised to call back or seek an in-person evaluation if the symptoms or condition worsens.  Duration of encounter: 30 minutes.  Note by: Gillis Santa, MD Date: 08/31/2020; Time: 9:38 AM

## 2020-08-31 NOTE — Progress Notes (Signed)
Nursing Pain Medication Assessment:  Safety precautions to be maintained throughout the outpatient stay will include: orient to surroundings, keep bed in low position, maintain call bell within reach at all times, provide assistance with transfer out of bed and ambulation.  Medication Inspection Compliance: Pill count conducted under aseptic conditions, in front of the patient. Neither the pills nor the bottle was removed from the patient's sight at any time. Once count was completed pills were immediately returned to the patient in their original bottle.  Medication: Hydrocodone/APAP Pill/Patch Count: 22 of 60 pills remain Pill/Patch Appearance: Markings consistent with prescribed medication Bottle Appearance: Standard pharmacy container. Clearly labeled. Filled Date: 1 / 06 / 22 Last Medication intake:  Yesterday

## 2020-09-01 ENCOUNTER — Ambulatory Visit: Payer: Medicare Other

## 2020-09-02 ENCOUNTER — Ambulatory Visit
Admission: RE | Admit: 2020-09-02 | Discharge: 2020-09-02 | Disposition: A | Discharge status: Home / Self Care | Payer: Medicare Other | Source: Ambulatory Visit | Attending: Otolaryngology | Admitting: Otolaryngology

## 2020-09-02 ENCOUNTER — Other Ambulatory Visit: Payer: Self-pay

## 2020-09-02 ENCOUNTER — Ambulatory Visit: Payer: Medicare Other | Admitting: Physical Therapy

## 2020-09-02 DIAGNOSIS — D380 Neoplasm of uncertain behavior of larynx: Secondary | ICD-10-CM | POA: Insufficient documentation

## 2020-09-02 MED ORDER — IOHEXOL 300 MG/ML  SOLN
75.0000 mL | Freq: Once | INTRAMUSCULAR | Status: AC | PRN
  Administered 2020-09-02: 75 mL via INTRAVENOUS

## 2020-09-03 ENCOUNTER — Ambulatory Visit: Payer: Medicare Other

## 2020-09-06 ENCOUNTER — Ambulatory Visit: Payer: Medicare Other

## 2020-09-07 ENCOUNTER — Encounter: Payer: Self-pay | Admitting: Otolaryngology

## 2020-09-07 ENCOUNTER — Ambulatory Visit: Payer: Medicare Other | Admitting: Physical Therapy

## 2020-09-08 ENCOUNTER — Ambulatory Visit: Payer: Medicare Other

## 2020-09-09 ENCOUNTER — Ambulatory Visit: Payer: Medicare Other | Admitting: Physical Therapy

## 2020-09-11 ENCOUNTER — Other Ambulatory Visit: Payer: Self-pay | Admitting: Student in an Organized Health Care Education/Training Program

## 2020-09-11 DIAGNOSIS — M5416 Radiculopathy, lumbar region: Secondary | ICD-10-CM

## 2020-09-11 DIAGNOSIS — G894 Chronic pain syndrome: Secondary | ICD-10-CM

## 2020-09-13 ENCOUNTER — Telehealth: Payer: Self-pay

## 2020-09-13 ENCOUNTER — Ambulatory Visit: Payer: Medicare Other

## 2020-09-13 MED ORDER — HYDROCODONE-ACETAMINOPHEN 7.5-325 MG PO TABS: 1.0000 | ORAL_TABLET | Freq: Three times a day (TID) | ORAL | 0 refills | Status: DC | PRN

## 2020-09-13 MED ORDER — HYDROCODONE-ACETAMINOPHEN 7.5-325 MG PO TABS: 1.0000 | ORAL_TABLET | Freq: Three times a day (TID) | ORAL | 0 refills | Status: AC | PRN

## 2020-09-13 NOTE — Telephone Encounter (Signed)
Hydrocodone for 09-07-20 and 10-07-20 did not send when they were prescribed. Will you please resend.

## 2020-09-13 NOTE — Telephone Encounter (Signed)
Pt states that she has not received her prescriptions yet. To be send to [facility].

## 2020-09-14 ENCOUNTER — Ambulatory Visit: Payer: Medicare Other | Admitting: Physical Therapy

## 2020-09-15 ENCOUNTER — Ambulatory Visit: Payer: Medicare Other | Attending: Neurology

## 2020-09-15 ENCOUNTER — Other Ambulatory Visit: Payer: Self-pay

## 2020-09-15 ENCOUNTER — Ambulatory Visit: Payer: Medicare Other

## 2020-09-15 DIAGNOSIS — R269 Unspecified abnormalities of gait and mobility: Secondary | ICD-10-CM | POA: Diagnosis present

## 2020-09-15 DIAGNOSIS — R2681 Unsteadiness on feet: Secondary | ICD-10-CM | POA: Diagnosis present

## 2020-09-15 DIAGNOSIS — M79604 Pain in right leg: Secondary | ICD-10-CM | POA: Diagnosis present

## 2020-09-15 DIAGNOSIS — M6281 Muscle weakness (generalized): Secondary | ICD-10-CM | POA: Diagnosis present

## 2020-09-15 NOTE — Therapy (Signed)
[place] MAIN [facility] SERVICES [address] [place], [place], [address] Phone: [phone]   Fax:  [phone]  Physical Therapy Treatment  Patient Details  Name: Victoria [name] MRN: [id number] Date of Birth: 05-17-1961 Referring Provider (PT): Vladimir Crofts, MD   Encounter Date: 09/15/2020   PT End of Session - 09/15/20 1229    Visit Number 5    Number of Visits 17    Date for PT Re-Evaluation 10/12/20    PT Start Time 1130    PT Stop Time 1216    PT Time Calculation (min) 46 min    Equipment Utilized During Treatment Gait belt    Activity Tolerance Patient tolerated treatment well    Behavior During Therapy [facility] for tasks assessed/performed           Past Medical History:  Diagnosis Date  . Allergy   . Asthma   . Cancer ([place])    Breast Left, DCIS 2018  . Collagen vascular disease (HCC)    Lupus  . COPD (chronic obstructive pulmonary disease) ([place])   . Diverticulitis   . Fibromyalgia   . GERD (gastroesophageal reflux disease)   . Headache    Migraines  . Hearing impaired person, left    bilateral hearing aids  . IBS (irritable bowel syndrome)   . Lupus ([place])   . Mitral valve disorder   . MS (multiple sclerosis) ([place])   . Neuromuscular disorder ([place])   . Neuropathy   . Osteoarthritis   . Osteoporosis 05/12/2019  . Personal history of radiation therapy 2017   left   . Rheumatoid arthritis (HCC)    hands and legs  . Seizures ([place])    no seizures for 10 years    Past Surgical History:  Procedure Laterality Date  . ABDOMINAL HYSTERECTOMY    . APPENDECTOMY    . BREAST EXCISIONAL BIOPSY Left 2017   lumpectomy with radation  . BREAST EXCISIONAL BIOPSY Left 2004   neg surgical bx   . BREAST LUMPECTOMY Left 2017   DCIS  . BREAST SURGERY    . CHOLECYSTECTOMY    . COLONOSCOPY WITH PROPOFOL N/A 07/07/2019   Procedure: COLONOSCOPY WITH PROPOFOL;  Surgeon: Toledo, Benay Pike, MD;  Location: [facility] ENDOSCOPY;  Service:  Gastroenterology;  Laterality: N/A;  . ESOPHAGOGASTRODUODENOSCOPY (EGD) WITH PROPOFOL N/A 07/07/2019   Procedure: ESOPHAGOGASTRODUODENOSCOPY (EGD) WITH PROPOFOL;  Surgeon: Toledo, Benay Pike, MD;  Location: [facility] ENDOSCOPY;  Service: Gastroenterology;  Laterality: N/A;  . PATENT DUCTUS ARTERIOUS REPAIR    . RECONSTRUCTION TENDON PULLEY HAND Left   . TOTAL HIP ARTHROPLASTY Right     There were no vitals filed for this visit.   Subjective Assessment - 09/15/20 1125    Subjective Pt reports 8/10 pain level generalized. Pt reports she went to rheumatologist earlier and that they think her pain is due to her fibro. Pt reports she will not be taking medication for it yet due until biopsy performed of her vocal cord for possible CA. Pt reports new type of L knee pain with onset on her recent drive to [place]. Pt reports minor swelling on L lateral knee.    Pertinent History Per chart: PMH includes: Lupus, seronegative arthritis, fibromyalgia, osteoporosis, breast cancer(radiation), morbid obesity, DVTs, IBS,COPD, chronic migraine, lymphedema, thrombocytopenia. Seizures, RA, MS? back pain that radiates down into both hips and legs. She has bilateral foot numbness. Toes will turn purple.    Limitations Sitting;Walking    How long can you sit  comfortably? Pt says sitting limited to 20 minutes, worsened with spinal extension and improved with flexion.    How long can you stand comfortably? no reported difficulty    How long can you walk comfortably? Pt reports difficulty with prolonged walking    Diagnostic tests MRI done on 05/10/2020 (see physician note)    Patient Stated Goals Pt would like to increae LE strength and decrease pain in order to walk her dog    Currently in Pain? Yes    Pain Score 8     Pain Descriptors / Indicators Aching;Dull    Pain Type Chronic pain    Pain Onset More than a month ago          Nustep step level 0 x 5 min; pt reports feeling OK/no pain   Therapeutic  Exercise: Leg press: 23# 3x12 Standing heel raises 3x15 UE support Standing hip abduction with B UE support - 3x12 B LEs. VC provided for technique/reduce lateral lean, UE support Standing hip extension - 3x15 B LEs; demonstration/VC/TC, UE support Seated marches 2.5# 1x20 and 3# 1x20; pt rates "medium"    PT Education - 09/15/20 1228    Education Details Standing hip extension, bodymechanics    Person(s) Educated Patient    Methods Explanation;Demonstration;Verbal cues;Tactile cues    Comprehension Verbalized understanding;Returned demonstration         Seated LAQ 2.5# AW 1x15 B, 3# 1x15 B, no weight 1x15 B     PT Short Term Goals - 08/17/20 1331      PT SHORT TERM GOAL #1   Title Patient will be independent in home exercise program to improve strength/mobility for better functional independence with ADLs.    Baseline 08/17/2020 Pt issued HEP    Period Weeks    Status New    Target Date 09/14/20             PT Long Term Goals - 08/17/20 1332      PT LONG TERM GOAL #1   Title Patient will increase FOTO score to equal to or greater than 60 to demonstrate statistically significant improvement in mobility and quality of life.    Baseline 08/17/2020 Pt score 55 (risk-adjusted 50)    Time 8    Period Weeks    Status New    Target Date 10/12/20      PT LONG TERM GOAL #2   Title Pt will increase MMT scores by at least 1/2 point to indicate increased B LE strength for performing functional activities.    Baseline 08/17/2020 MMT L/R hip flexion 3/5, 3/5; hip abduction 3/5, 3+/5; hip adduction 2+/5, 3-/5; knee flexion 4-/5, 3+/5, knee extension 4-/5, 3/5; ankle 3/5 B; ankle plantarflexion 4/5 B    Time 8    Period Weeks    Status New    Target Date 10/12/20      PT LONG TERM GOAL #3   Title Patient will increase 10 meter walk test to >1.42m/s as to improve gait speed for better community ambulation and to reduce fall risk.    Baseline 08/17/2020 0.65 m/s no AD    Time 8     Period Weeks    Status New    Target Date 10/12/20      PT LONG TERM GOAL #4   Title Patient (< 38 years old) will complete five times sit to stand test in < 10 seconds indicating an increased LE strength and improved balance.    Baseline 08/17/2020 5xSTS completed  in 14.98 sec    Time 8    Period Weeks    Status New    Target Date 10/12/20      PT LONG TERM GOAL #5   Title Patient will increase Berg Balance score by > 6 points to demonstrate decreased fall risk during functional activities.    Baseline 08/17/2020 Berg score 50/56    Time 8    Period Weeks    Target Date 10/12/20                 Plan - 09/15/20 1229    Clinical Impression Statement Pt able to perform all but one exercise today without reports of increased pain from baseline. Pt also able to progress LAQ to use of 3# AW, although pt did start to report a "pull" so therex was modified to no AW last set avoid increase in pain sx. Pt will benefit from further skilled therapy to improve BLE strength, mobility, and decrease pain to improve QOL.    Personal Factors and Comorbidities Comorbidity 1;Comorbidity 2;Comorbidity 3+;Fitness    Comorbidities pertinent: hx of thrombosis, chronic fatigue, osteoporisis, HA disorder, polyathralgia, COPD, IBS, Fibromyalgia, lupus    Examination-Activity Limitations Stairs;Transfers;Locomotion Level;Sleep;Squat;Stand;Reach Overhead    Examination-Participation Restrictions Community Activity;Meal Prep;Laundry;Shop;Volunteer;Yard Work;Interpersonal Relationship    Stability/Clinical Decision Making Evolving/Moderate complexity    Rehab Potential Good    PT Frequency 2x / week    PT Duration 8 weeks    PT Treatment/Interventions ADLs/Self Care Home Management;Electrical Stimulation;Moist Heat;DME Instruction;Gait training;Stair training;Cryotherapy;Ultrasound;Functional mobility training;Neuromuscular re-education;Balance training;Therapeutic exercise;Therapeutic  activities;Patient/family education;Orthotic Fit/Training;Manual techniques;Passive range of motion;Energy conservation;Taping;Canalith Repostioning;Vestibular;Visual/perceptual remediation/compensation    PT Next Visit Plan Obstacle course with focus on stepping over obstacles and navigating around cones; progress leg press    PT Home Exercise Plan see HEP (medbridge link)    Consulted and Agree with Plan of Care Patient           Patient will benefit from skilled therapeutic intervention in order to improve the following deficits and impairments:  Abnormal gait,Decreased activity tolerance,Decreased balance,Decreased endurance,Decreased coordination,Decreased mobility,Decreased strength,Decreased range of motion,Pain,Improper body mechanics,Postural dysfunction,Impaired flexibility,Difficulty walking,Cardiopulmonary status limiting activity,Decreased knowledge of use of DME,Obesity  Visit Diagnosis: Muscle weakness (generalized)  Unsteadiness on feet  Gait abnormality     Problem List Patient Active Problem List   Diagnosis Date Noted  . Vitamin D deficiency 08/22/2019  . History of thrombosis 08/22/2019  . Chronic fatigue 07/17/2019  . Osteoporosis 05/12/2019  . STEC (Shiga toxin-producing Escherichia coli) infection 04/06/2019  . Diverticulitis 04/06/2019  . Thrombocytopenia ([place]) 01/23/2019  . Leukopenia 01/23/2019  . Lymphedema of both lower extremities 01/16/2019  . Polyarthralgia 10/14/2018  . Headache disorder 10/14/2018  . Chronic daily headache 10/11/2018  . COPD (chronic obstructive pulmonary disease) ([place]) 10/11/2018  . Thoracic aortic atherosclerosis ([place]) 10/11/2018  . Multiple episodes of deep venous thrombosis ([place]) 09/17/2018  . IBS (irritable bowel syndrome) 09/17/2018  . Healthcare maintenance 09/17/2018  . Primary osteoarthritis of right hip 09/03/2018  . Presence of right hip implant 09/03/2018  . Morbid obesity with BMI of 45.0-49.9, adult ([place])  08/23/2018  . Shortness of breath 08/22/2018  . History of ductal carcinoma in situ (DCIS) of breast 07/20/2018  . Aromatase inhibitor use 07/20/2018  . Abnormal findings on auscultation 07/16/2018  . Encounter for long-term (current) use of high-risk medication 07/16/2018  . Fibromyalgia 07/16/2018  . Other forms of systemic lupus erythematosus ([place]) 07/16/2018  . Other osteoporosis without current pathological fracture 07/16/2018  .  Seronegative arthritis 07/16/2018   Ricard Dillon PT, DPT 09/15/2020, 12:39 PM  [place] MAIN [facility] SERVICES [address] [place], [place], [address] Phone: [phone]   Fax:  [phone]  Name: Victoria [name] MRN: [id number] Date of Birth: 1960-10-13

## 2020-09-16 ENCOUNTER — Ambulatory Visit: Payer: Medicare Other | Admitting: Physical Therapy

## 2020-09-20 ENCOUNTER — Other Ambulatory Visit: Payer: Self-pay

## 2020-09-20 ENCOUNTER — Ambulatory Visit: Payer: Medicare Other

## 2020-09-20 ENCOUNTER — Other Ambulatory Visit
Admission: RE | Admit: 2020-09-20 | Discharge: 2020-09-20 | Disposition: A | Discharge status: Home / Self Care | Payer: Medicare Other | Source: Ambulatory Visit | Attending: Otolaryngology | Admitting: Otolaryngology

## 2020-09-20 DIAGNOSIS — M6281 Muscle weakness (generalized): Secondary | ICD-10-CM | POA: Diagnosis not present

## 2020-09-20 DIAGNOSIS — M79604 Pain in right leg: Secondary | ICD-10-CM

## 2020-09-20 DIAGNOSIS — Z20822 Contact with and (suspected) exposure to covid-19: Secondary | ICD-10-CM | POA: Insufficient documentation

## 2020-09-20 DIAGNOSIS — Z01812 Encounter for preprocedural laboratory examination: Secondary | ICD-10-CM | POA: Diagnosis present

## 2020-09-20 DIAGNOSIS — R2681 Unsteadiness on feet: Secondary | ICD-10-CM

## 2020-09-20 DIAGNOSIS — R269 Unspecified abnormalities of gait and mobility: Secondary | ICD-10-CM

## 2020-09-20 NOTE — Therapy (Signed)
[place] MAIN [facility] SERVICES [address] [place], [place], [address] Phone: [phone]   Fax:  [phone]  Physical Therapy Treatment  Patient Details  Name: Victoria [name] MRN: [id number] Date of Birth: 12-Mar-1961 Referring Provider (PT): Vladimir Crofts, MD   Encounter Date: 09/20/2020   PT End of Session - 09/20/20 1508    Visit Number 6    Number of Visits 17    Date for PT Re-Evaluation 10/12/20    PT Start Time 1301    PT Stop Time 1345    PT Time Calculation (min) 44 min    Equipment Utilized During Treatment Gait belt    Activity Tolerance Patient tolerated treatment well    Behavior During Therapy [facility] for tasks assessed/performed           Past Medical History:  Diagnosis Date  . Allergy   . Asthma   . Cancer ([place])    Breast Left, DCIS 2018  . Collagen vascular disease (HCC)    Lupus  . COPD (chronic obstructive pulmonary disease) ([place])   . Diverticulitis   . Fibromyalgia   . GERD (gastroesophageal reflux disease)   . Headache    Migraines  . Hearing impaired person, left    bilateral hearing aids  . IBS (irritable bowel syndrome)   . Lupus ([place])   . Mitral valve disorder   . MS (multiple sclerosis) ([place])   . Neuromuscular disorder ([place])   . Neuropathy   . Osteoarthritis   . Osteoporosis 05/12/2019  . Personal history of radiation therapy 2017   left   . Rheumatoid arthritis (HCC)    hands and legs  . Seizures ([place])    no seizures for 10 years    Past Surgical History:  Procedure Laterality Date  . ABDOMINAL HYSTERECTOMY    . APPENDECTOMY    . BREAST EXCISIONAL BIOPSY Left 2017   lumpectomy with radation  . BREAST EXCISIONAL BIOPSY Left 2004   neg surgical bx   . BREAST LUMPECTOMY Left 2017   DCIS  . BREAST SURGERY    . CHOLECYSTECTOMY    . COLONOSCOPY WITH PROPOFOL N/A 07/07/2019   Procedure: COLONOSCOPY WITH PROPOFOL;  Surgeon: Toledo, Benay Pike, MD;  Location: [facility] ENDOSCOPY;  Service:  Gastroenterology;  Laterality: N/A;  . ESOPHAGOGASTRODUODENOSCOPY (EGD) WITH PROPOFOL N/A 07/07/2019   Procedure: ESOPHAGOGASTRODUODENOSCOPY (EGD) WITH PROPOFOL;  Surgeon: Toledo, Benay Pike, MD;  Location: [facility] ENDOSCOPY;  Service: Gastroenterology;  Laterality: N/A;  . PATENT DUCTUS ARTERIOUS REPAIR    . RECONSTRUCTION TENDON PULLEY HAND Left   . TOTAL HIP ARTHROPLASTY Right     There were no vitals filed for this visit.  Treatment:  Subjective Assessment - 09/20/20 1305    Subjective Pt reports continued 8/10 knee pain. Pt reports L knee pain and swelling started three months ago after gardening. Pt reports she wears knee pads when she gardens.    Pertinent History Per chart: PMH includes: Lupus, seronegative arthritis, fibromyalgia, osteoporosis, breast cancer(radiation), morbid obesity, DVTs, IBS,COPD, chronic migraine, lymphedema, thrombocytopenia. Seizures, RA, MS? back pain that radiates down into both hips and legs. She has bilateral foot numbness. Toes will turn purple.    Limitations Sitting;Walking    How long can you sit comfortably? Pt says sitting limited to 20 minutes, worsened with spinal extension and improved with flexion.    How long can you stand comfortably? no reported difficulty    How long can you walk comfortably? Pt reports difficulty with  prolonged walking    Diagnostic tests MRI done on 05/10/2020 (see physician note)    Patient Stated Goals Pt would like to increae LE strength and decrease pain in order to walk her dog    Currently in Pain? Yes    Pain Score 8     Pain Location Knee    Pain Orientation Right;Left    Pain Onset More than a month ago           Palpation: L knee joint line pain lateral/medial. LLE swollen compared to R LE just inferior to L patella. Pt also TTP with pain posterior L knee. Pt reports brief "shooting" pain in knee with palpation.    Therapeutic Exercise:   Nustep step level 2 x 2 min, level 0 x 3 min;  started at level 2 but  reduced to 0 d/t knee pain that decreased with lower level.   Seated hip adduction with red ball -- 3x15 pt reports exercise is "medium" difficulty  Standing heel raises - 3x15 BUE support and 2.5# AW   Standing hip abduction with B UE support and 2.5# AW - 3x15 B LEs. VC provided for technique/reduce lateral lean, UE support  Standing marches with 2.5# AW 1x15, 1x12  Seated marches 2.5# 1x20 and 3# 1x20; pt rates "medium"  Seated LAQ 2.5# AW 1x15 B, 3# 1x15 B pt reports harder on LLE had to remove weight due to low back pain that resolved when weights removed.   LAQ no weight - 1x15 BLE with no reports of pain  Sit<>stands - 3x10 without UE support  Seated GTB core twists 1x15 B   Assessment: Attempted increased weights with seated LAQ today, however,  pt reported increased back pain stopping exercise. Once pt performed exercise without weights her pain was back to baseline. Palpation was performed to L knee at joint line due to pt reports of chronic L knee pain and swelling. Pt was TTP on posterior L knee and both medial and lateral on the anterior knee with noted swelling just inferior to the patella. No temperature difference between R and L knee and no redness noted. Pt instructed to discuss chronic L knee pain and swelling with her physician at her upcoming appointment. Pt will benefit from further skilled therapy to improve pain, BLE strength and mobility.     PT Education - 09/20/20 1508    Education Details Pt educated on exercise technique with adductor squeezes    Person(s) Educated Patient    Methods Explanation;Demonstration;Verbal cues    Comprehension Returned demonstration;Verbalized understanding            PT Short Term Goals - 08/17/20 1331      PT SHORT TERM GOAL #1   Title Patient will be independent in home exercise program to improve strength/mobility for better functional independence with ADLs.    Baseline 08/17/2020 Pt issued HEP    Period Weeks     Status New    Target Date 09/14/20             PT Long Term Goals - 08/17/20 1332      PT LONG TERM GOAL #1   Title Patient will increase FOTO score to equal to or greater than 60 to demonstrate statistically significant improvement in mobility and quality of life.    Baseline 08/17/2020 Pt score 55 (risk-adjusted 50)    Time 8    Period Weeks    Status New    Target Date 10/12/20  PT LONG TERM GOAL #2   Title Pt will increase MMT scores by at least 1/2 point to indicate increased B LE strength for performing functional activities.    Baseline 08/17/2020 MMT L/R hip flexion 3/5, 3/5; hip abduction 3/5, 3+/5; hip adduction 2+/5, 3-/5; knee flexion 4-/5, 3+/5, knee extension 4-/5, 3/5; ankle 3/5 B; ankle plantarflexion 4/5 B    Time 8    Period Weeks    Status New    Target Date 10/12/20      PT LONG TERM GOAL #3   Title Patient will increase 10 meter walk test to >1.37m/s as to improve gait speed for better community ambulation and to reduce fall risk.    Baseline 08/17/2020 0.65 m/s no AD    Time 8    Period Weeks    Status New    Target Date 10/12/20      PT LONG TERM GOAL #4   Title Patient (< 14 years old) will complete five times sit to stand test in < 10 seconds indicating an increased LE strength and improved balance.    Baseline 08/17/2020 5xSTS completed in 14.98 sec    Time 8    Period Weeks    Status New    Target Date 10/12/20      PT LONG TERM GOAL #5   Title Patient will increase Berg Balance score by > 6 points to demonstrate decreased fall risk during functional activities.    Baseline 08/17/2020 Berg score 50/56    Time 8    Period Weeks    Target Date 10/12/20                 Plan - 09/20/20 1522    Clinical Impression Statement Attempted increased weights with seated LAQ today, however,  pt reported increased back pain stopping exercise. Once pt performed exercise without weights her pain was back to baseline. Palpation was performed to L  knee at joint line due to pt reports of chronic L knee pain and swelling. Pt was TTP on posterior L knee and both medial and lateral on the anterior knee with noted swelling just inferior to the patella. No temperature difference between R and L knee and no redness noted. Pt instructed to discuss chronic L knee pain and swelling with her physician at her upcoming appointment. Pt will benefit from further skilled therapy to improve pain, BLE strength and mobility.    Personal Factors and Comorbidities Comorbidity 1;Comorbidity 2;Comorbidity 3+;Fitness    Comorbidities pertinent: hx of thrombosis, chronic fatigue, osteoporisis, HA disorder, polyathralgia, COPD, IBS, Fibromyalgia, lupus    Examination-Activity Limitations Stairs;Transfers;Locomotion Level;Sleep;Squat;Stand;Reach Overhead    Examination-Participation Restrictions Community Activity;Meal Prep;Laundry;Shop;Volunteer;Yard Work;Interpersonal Relationship    Stability/Clinical Decision Making Evolving/Moderate complexity    Rehab Potential Good    PT Frequency 2x / week    PT Duration 8 weeks    PT Treatment/Interventions ADLs/Self Care Home Management;Electrical Stimulation;Moist Heat;DME Instruction;Gait training;Stair training;Cryotherapy;Ultrasound;Functional mobility training;Neuromuscular re-education;Balance training;Therapeutic exercise;Therapeutic activities;Patient/family education;Orthotic Fit/Training;Manual techniques;Passive range of motion;Energy conservation;Taping;Canalith Repostioning;Vestibular;Visual/perceptual remediation/compensation    PT Next Visit Plan Obstacle course with focus on stepping over obstacles and navigating around cones; progress leg press    PT Home Exercise Plan see HEP (medbridge link)    Consulted and Agree with Plan of Care Patient           Patient will benefit from skilled therapeutic intervention in order to improve the following deficits and impairments:  Abnormal gait,Decreased activity  tolerance,Decreased balance,Decreased endurance,Decreased coordination,Decreased mobility,Decreased  strength,Decreased range of motion,Pain,Improper body mechanics,Postural dysfunction,Impaired flexibility,Difficulty walking,Cardiopulmonary status limiting activity,Decreased knowledge of use of DME,Obesity  Visit Diagnosis: Muscle weakness (generalized)  Unsteadiness on feet  Gait abnormality  Pain of right lower extremity     Problem List Patient Active Problem List   Diagnosis Date Noted  . Vitamin D deficiency 08/22/2019  . History of thrombosis 08/22/2019  . Chronic fatigue 07/17/2019  . Osteoporosis 05/12/2019  . STEC (Shiga toxin-producing Escherichia coli) infection 04/06/2019  . Diverticulitis 04/06/2019  . Thrombocytopenia ([place]) 01/23/2019  . Leukopenia 01/23/2019  . Lymphedema of both lower extremities 01/16/2019  . Polyarthralgia 10/14/2018  . Headache disorder 10/14/2018  . Chronic daily headache 10/11/2018  . COPD (chronic obstructive pulmonary disease) ([place]) 10/11/2018  . Thoracic aortic atherosclerosis ([place]) 10/11/2018  . Multiple episodes of deep venous thrombosis ([place]) 09/17/2018  . IBS (irritable bowel syndrome) 09/17/2018  . Healthcare maintenance 09/17/2018  . Primary osteoarthritis of right hip 09/03/2018  . Presence of right hip implant 09/03/2018  . Morbid obesity with BMI of 45.0-49.9, adult ([place]) 08/23/2018  . Shortness of breath 08/22/2018  . History of ductal carcinoma in situ (DCIS) of breast 07/20/2018  . Aromatase inhibitor use 07/20/2018  . Abnormal findings on auscultation 07/16/2018  . Encounter for long-term (current) use of high-risk medication 07/16/2018  . Fibromyalgia 07/16/2018  . Other forms of systemic lupus erythematosus ([place]) 07/16/2018  . Other osteoporosis without current pathological fracture 07/16/2018  . Seronegative arthritis 07/16/2018   Ricard Dillon PT, DPT 09/20/2020, 3:23 PM  [place] MAIN [facility] SERVICES [address] [place], [place], [address] Phone: [phone]   Fax:  [phone]  Name: Victoria [name] MRN: [id number] Date of Birth: [birth year]

## 2020-09-21 ENCOUNTER — Ambulatory Visit: Payer: Medicare Other | Admitting: Physical Therapy

## 2020-09-21 LAB — SARS CORONAVIRUS 2 (TAT 6-24 HRS): SARS Coronavirus 2: NEGATIVE

## 2020-09-21 NOTE — Discharge Instructions (Signed)

## 2020-09-22 ENCOUNTER — Ambulatory Visit: Payer: Medicare Other | Admitting: Anesthesiology

## 2020-09-22 ENCOUNTER — Ambulatory Visit
Admission: RE | Admit: 2020-09-22 | Discharge: 2020-09-22 | Disposition: A | Discharge status: Home / Self Care | Payer: Medicare Other | Attending: Otolaryngology | Admitting: Otolaryngology

## 2020-09-22 ENCOUNTER — Ambulatory Visit: Payer: Medicare Other

## 2020-09-22 ENCOUNTER — Other Ambulatory Visit: Payer: Self-pay

## 2020-09-22 ENCOUNTER — Encounter
Admission: RE | Disposition: A | Discharge status: Home / Self Care | Payer: Self-pay | Source: Home / Self Care | Attending: Otolaryngology

## 2020-09-22 ENCOUNTER — Encounter: Payer: Self-pay | Admitting: Otolaryngology

## 2020-09-22 DIAGNOSIS — Z79899 Other long term (current) drug therapy: Secondary | ICD-10-CM | POA: Insufficient documentation

## 2020-09-22 DIAGNOSIS — Z87891 Personal history of nicotine dependence: Secondary | ICD-10-CM | POA: Diagnosis not present

## 2020-09-22 DIAGNOSIS — Z7951 Long term (current) use of inhaled steroids: Secondary | ICD-10-CM | POA: Diagnosis not present

## 2020-09-22 DIAGNOSIS — J382 Nodules of vocal cords: Secondary | ICD-10-CM | POA: Diagnosis not present

## 2020-09-22 DIAGNOSIS — J387 Other diseases of larynx: Secondary | ICD-10-CM | POA: Diagnosis not present

## 2020-09-22 DIAGNOSIS — R490 Dysphonia: Secondary | ICD-10-CM | POA: Insufficient documentation

## 2020-09-22 HISTORY — PX: DIRECT LARYNGOSCOPY: SHX5326

## 2020-09-22 HISTORY — DX: Polyneuropathy, unspecified: G62.9

## 2020-09-22 HISTORY — DX: Headache, unspecified: R51.9

## 2020-09-22 SURGERY — LARYNGOSCOPY, DIRECT
Anesthesia: General | Site: Throat

## 2020-09-22 MED ORDER — OXYMETAZOLINE HCL 0.05 % NA SOLN
NASAL | Status: DC | PRN
  Administered 2020-09-22: 1 via TOPICAL

## 2020-09-22 MED ORDER — ONDANSETRON HCL 4 MG PO TABS: 4.0000 mg | ORAL_TABLET | Freq: Three times a day (TID) | ORAL | 0 refills | Status: AC | PRN

## 2020-09-22 MED ORDER — MIDAZOLAM HCL 5 MG/5ML IJ SOLN
INTRAMUSCULAR | Status: DC | PRN
  Administered 2020-09-22: 2 mg via INTRAVENOUS

## 2020-09-22 MED ORDER — GLYCOPYRROLATE 0.2 MG/ML IJ SOLN
INTRAMUSCULAR | Status: DC | PRN
  Administered 2020-09-22: .1 mg via INTRAVENOUS

## 2020-09-22 MED ORDER — LIDOCAINE HCL (CARDIAC) PF 100 MG/5ML IV SOSY
PREFILLED_SYRINGE | INTRAVENOUS | Status: DC | PRN
  Administered 2020-09-22: 50 mg via INTRAVENOUS

## 2020-09-22 MED ORDER — DEXAMETHASONE SODIUM PHOSPHATE 4 MG/ML IJ SOLN
INTRAMUSCULAR | Status: DC | PRN
  Administered 2020-09-22: 8 mg via INTRAVENOUS

## 2020-09-22 MED ORDER — LACTATED RINGERS IV SOLN: INTRAVENOUS | Status: DC

## 2020-09-22 MED ORDER — PROPOFOL 10 MG/ML IV BOLUS
INTRAVENOUS | Status: DC | PRN
  Administered 2020-09-22: 100 mg via INTRAVENOUS
  Administered 2020-09-22: 30 mg via INTRAVENOUS

## 2020-09-22 MED ORDER — FENTANYL CITRATE (PF) 100 MCG/2ML IJ SOLN: 25.0000 ug | INTRAMUSCULAR | Status: DC | PRN

## 2020-09-22 MED ORDER — FENTANYL CITRATE (PF) 100 MCG/2ML IJ SOLN
25.0000 ug | INTRAMUSCULAR | Status: DC | PRN
  Administered 2020-09-22 (×2): 25 ug via INTRAVENOUS
  Administered 2020-09-22: 50 ug via INTRAVENOUS

## 2020-09-22 MED ORDER — OXYCODONE HCL 5 MG PO TABS: 5.0000 mg | ORAL_TABLET | Freq: Once | ORAL | Status: AC | PRN

## 2020-09-22 MED ORDER — OXYCODONE HCL 5 MG/5ML PO SOLN
5.0000 mg | Freq: Once | ORAL | Status: AC | PRN
  Administered 2020-09-22: 5 mg via ORAL

## 2020-09-22 MED ORDER — SUCCINYLCHOLINE CHLORIDE 20 MG/ML IJ SOLN
INTRAMUSCULAR | Status: DC | PRN
  Administered 2020-09-22: 100 mg via INTRAVENOUS

## 2020-09-22 MED ORDER — ONDANSETRON HCL 4 MG/2ML IJ SOLN
INTRAMUSCULAR | Status: DC | PRN
  Administered 2020-09-22: 4 mg via INTRAVENOUS

## 2020-09-22 SURGICAL SUPPLY — 18 items
BLOCK BITE GUARD (MISCELLANEOUS) ×2 IMPLANT
COVER MAYO STAND STRL (DRAPES) ×2 IMPLANT
COVER TABLE BACK 60X90 (DRAPES) ×2 IMPLANT
CUP MEDICINE 2OZ PLAST GRAD ST (MISCELLANEOUS) ×2 IMPLANT
DRSG TELFA 4X3 1S NADH ST (GAUZE/BANDAGES/DRESSINGS) ×2 IMPLANT
GLOVE SURG LX 7.5 STRW (GLOVE) ×2
GLOVE SURG LX STRL 7.5 STRW (GLOVE) ×2 IMPLANT
KIT TURNOVER KIT A (KITS) ×2 IMPLANT
MARKER SKIN DUAL TIP RULER LAB (MISCELLANEOUS) ×2 IMPLANT
NEEDLE 18GX1X1/2 (RX/OR ONLY) (NEEDLE) ×2 IMPLANT
PATTIES SURGICAL .5 X.5 (GAUZE/BANDAGES/DRESSINGS) ×2 IMPLANT
SOL ANTI-FOG 6CC FOG-OUT (MISCELLANEOUS) ×1 IMPLANT
SOL FOG-OUT ANTI-FOG 6CC (MISCELLANEOUS) ×1
SPONGE XRAY 4X4 16PLY STRL (MISCELLANEOUS) ×2 IMPLANT
STRAP BODY AND KNEE 60X3 (MISCELLANEOUS) ×2 IMPLANT
TOWEL OR 17X26 4PK STRL BLUE (TOWEL DISPOSABLE) ×2 IMPLANT
TUBING CONN 6MMX3.1M (TUBING) ×1
TUBING SUCTION CONN 0.25 STRL (TUBING) ×1 IMPLANT

## 2020-09-22 NOTE — Transfer of Care (Signed)
Immediate Anesthesia Transfer of Care Note  Patient: Victoria [name]  Procedure(s) Performed: SUSPENSION MICRODIRECT LARYNGOSCOPY WITH BIOPSY (N/A Throat)  Patient Location: PACU  Anesthesia Type: General ETT  Level of Consciousness: awake, alert  and patient cooperative  Airway and Oxygen Therapy: Patient Spontanous Breathing and Patient connected to supplemental oxygen  Post-op Assessment: Post-op Vital signs reviewed, Patient's Cardiovascular Status Stable, Respiratory Function Stable, Patent Airway and No signs of Nausea or vomiting  Post-op Vital Signs: Reviewed and stable  Complications: No complications documented.

## 2020-09-22 NOTE — Op Note (Signed)
....  09/22/2020  9:08 AM    Idanha Blas  [id number]   Pre-Op Dx:  Left false vocal fold lesion  Post-op Dx: same  Proc: Suspension Microlaryngoscopy with biopsy  Surg:  Jeannie Fend Zolton Dowson  Anes:  GOT  EBL:  54ml  Comp:  none  Findings:  Left false vocal fold medialization secondary to cartilage structure.  Small irregularity of mucosa biopsied and removed but no cystic structure identified.  Beneath the mucosa was cartilage filling majority of false vocal fold.  No further biopsies were taken as to not expose large area of cartilage.  Procedure: After the patient was identified in holding and the history and physical and consent was reviewed, the patient was taken to the operating room and placed in a supine position. General endotracheal anesthesia was induced in the normal fashion.  At this time, the patient was rotated 90 degrees and a shoulder roll was placed as well as well as a mouth guard.   At this time,Dedo laryngoscope was inserted into the patient's oral cavity. Visualization of the oral cavity, oropharynx, pharynx and larynx was made. This demonstrated an abnormality of the left false vocal fold and this was photographed.   A magnified zero degree Hopkin's rod was used to evaluate the mass. Topical Afrin on pledgets were placed against the lesion for approximately one minute. At this time, the Dedo laryngoscope was suspended from the [facility] stand.  Under high power magnification, a micro cup forcep was used to gently grasp the mass and evaluate it.  Just beneath the surface was a hard cartilagenous growth.  The mucosal abnormality was biopsied and sent for permanent evaluation.  No further abnormality was identified on the mucosa.  Evaluation of the mass with palpation revealed a cartilagenous hard structure that appeared in continuity with thyroid cartilage.  Afrin soaked pledgets were placed for hemostasis.  The patient's entire airway was next evaluated for hemostasis and  meticulous suctioning was performed. 0.25ccs of 4% lidocaine was sprayed onto the epiglottis. The patient was released from suspension and the mouth gag and laryngoscope was removed from the patient's oral cavity without injury to teeth, lips, or gums.    Dispo:   To PACU in good condition  Plan:  Soft Diet, follow up in 1 week for repeat evaluation and review of pathology.  Copeland Neisen  09/22/2020 9:08 AM

## 2020-09-22 NOTE — Anesthesia Procedure Notes (Signed)
Procedure Name: Intubation Date/Time: 09/22/2020 8:47 AM Performed by: Mayme Genta, CRNA Pre-anesthesia Checklist: Patient identified, Emergency Drugs available, Suction available, Patient being monitored and Timeout performed Patient Re-evaluated:Patient Re-evaluated prior to induction Oxygen Delivery Method: Circle system utilized Preoxygenation: Pre-oxygenation with 100% oxygen Induction Type: IV induction Ventilation: Mask ventilation without difficulty Laryngoscope Size: Miller and 2 Grade View: Grade I Tube type: MLT Tube size: 6.0 mm Number of attempts: 1 Placement Confirmation: ETT inserted through vocal cords under direct vision,  positive ETCO2 and breath sounds checked- equal and bilateral Tube secured with: Tape Dental Injury: Teeth and Oropharynx as per pre-operative assessment

## 2020-09-22 NOTE — H&P (Signed)
..  History and Physical paper copy reviewed and updated date of procedure and will be scanned into system.  Patient seen and examined.  

## 2020-09-22 NOTE — Anesthesia Postprocedure Evaluation (Signed)
Anesthesia Post Note  Patient: Victoria [name]  Procedure(s) Performed: SUSPENSION MICRODIRECT LARYNGOSCOPY WITH BIOPSY (N/A Throat)     Patient location during evaluation: PACU Anesthesia Type: General Level of consciousness: awake and alert Pain management: pain level controlled Vital Signs Assessment: post-procedure vital signs reviewed and stable Respiratory status: spontaneous breathing Cardiovascular status: stable Anesthetic complications: no   No complications documented.  Gillian Scarce

## 2020-09-22 NOTE — Anesthesia Preprocedure Evaluation (Addendum)
Anesthesia Evaluation  Patient identified by MRN, date of birth, ID band Patient awake    Reviewed: Allergy & Precautions, H&P , NPO status , Patient's Chart, lab work & pertinent test results  Airway Mallampati: II  TM Distance: >3 FB Neck ROM: full    Dental no notable dental hx.    Pulmonary asthma , COPD, former smoker,    Pulmonary exam normal        Cardiovascular negative cardio ROS Normal cardiovascular exam Rhythm:regular Rate:Normal     Neuro/Psych  Headaches, Seizures -,     GI/Hepatic Neg liver ROS, Medicated,  Endo/Other  negative endocrine ROS  Renal/GU negative Renal ROS  negative genitourinary   Musculoskeletal   Abdominal   Peds  Hematology negative hematology ROS (+)   Anesthesia Other Findings   Reproductive/Obstetrics                            Anesthesia Physical Anesthesia Plan  ASA: II  Anesthesia Plan: General ETT   Post-op Pain Management:    Induction:   PONV Risk Score and Plan: 3 and Ondansetron, Dexamethasone and Treatment may vary due to age or medical condition  Airway Management Planned:   Additional Equipment:   Intra-op Plan:   Post-operative Plan:   Informed Consent: I have reviewed the patients History and Physical, chart, labs and discussed the procedure including the risks, benefits and alternatives for the proposed anesthesia with the patient or authorized representative who has indicated his/her understanding and acceptance.       Plan Discussed with:   Anesthesia Plan Comments:         Anesthesia Quick Evaluation

## 2020-09-23 ENCOUNTER — Ambulatory Visit: Payer: Medicare Other | Admitting: Physical Therapy

## 2020-09-24 ENCOUNTER — Encounter: Payer: Self-pay | Admitting: Otolaryngology

## 2020-09-24 DIAGNOSIS — R7303 Prediabetes: Secondary | ICD-10-CM | POA: Insufficient documentation

## 2020-09-24 LAB — SURGICAL PATHOLOGY

## 2020-09-27 ENCOUNTER — Ambulatory Visit: Payer: Medicare Other

## 2020-09-28 ENCOUNTER — Encounter: Payer: Medicare Other | Admitting: Student in an Organized Health Care Education/Training Program

## 2020-09-28 ENCOUNTER — Ambulatory Visit: Payer: Medicare Other | Admitting: Physical Therapy

## 2020-09-29 ENCOUNTER — Other Ambulatory Visit: Payer: Self-pay

## 2020-09-29 ENCOUNTER — Ambulatory Visit: Payer: Medicare Other

## 2020-09-29 DIAGNOSIS — M79604 Pain in right leg: Secondary | ICD-10-CM

## 2020-09-29 DIAGNOSIS — M6281 Muscle weakness (generalized): Secondary | ICD-10-CM | POA: Diagnosis not present

## 2020-09-29 DIAGNOSIS — R269 Unspecified abnormalities of gait and mobility: Secondary | ICD-10-CM

## 2020-09-29 DIAGNOSIS — R2681 Unsteadiness on feet: Secondary | ICD-10-CM

## 2020-09-29 NOTE — Therapy (Signed)
[place] MAIN [facility] SERVICES [address] [place], [place], [address] Phone: [phone]   Fax:  [phone]  Physical Therapy Treatment  Patient Details  Name: Victoria [name] MRN: [id number] Date of Birth: Dec 21, 1960 Referring Provider (PT): Vladimir Crofts, MD   Encounter Date: 09/29/2020   PT End of Session - 09/29/20 1454    Visit Number 7    Number of Visits 17    Date for PT Re-Evaluation 10/12/20    PT Start Time 8527    PT Stop Time 1444    PT Time Calculation (min) 39 min    Equipment Utilized During Treatment Gait belt    Activity Tolerance Patient tolerated treatment well    Behavior During Therapy [facility] for tasks assessed/performed           Past Medical History:  Diagnosis Date  . Allergy   . Asthma   . Cancer ([place])    Breast Left, DCIS 2018  . Collagen vascular disease (HCC)    Lupus  . COPD (chronic obstructive pulmonary disease) ([place])   . Diverticulitis   . Fibromyalgia   . GERD (gastroesophageal reflux disease)   . Headache    Migraines  . Hearing impaired person, left    bilateral hearing aids  . IBS (irritable bowel syndrome)   . Lupus ([place])   . Mitral valve disorder   . MS (multiple sclerosis) ([place])   . Neuromuscular disorder ([place])   . Neuropathy   . Osteoarthritis   . Osteoporosis 05/12/2019  . Personal history of radiation therapy 2017   left   . Rheumatoid arthritis (HCC)    hands and legs  . Seizures ([place])    no seizures for 10 years    Past Surgical History:  Procedure Laterality Date  . ABDOMINAL HYSTERECTOMY    . APPENDECTOMY    . BREAST EXCISIONAL BIOPSY Left 2017   lumpectomy with radation  . BREAST EXCISIONAL BIOPSY Left 2004   neg surgical bx   . BREAST LUMPECTOMY Left 2017   DCIS  . BREAST SURGERY    . CHOLECYSTECTOMY    . COLONOSCOPY WITH PROPOFOL N/A 07/07/2019   Procedure: COLONOSCOPY WITH PROPOFOL;  Surgeon: Toledo, Benay Pike, MD;  Location: [facility] ENDOSCOPY;  Service:  Gastroenterology;  Laterality: N/A;  . DIRECT LARYNGOSCOPY N/A 09/22/2020   Procedure: SUSPENSION MICRODIRECT LARYNGOSCOPY WITH BIOPSY;  Surgeon: Carloyn Manner, MD;  Location: [place];  Service: ENT;  Laterality: N/A;  has port a cath needs accessed  . ESOPHAGOGASTRODUODENOSCOPY (EGD) WITH PROPOFOL N/A 07/07/2019   Procedure: ESOPHAGOGASTRODUODENOSCOPY (EGD) WITH PROPOFOL;  Surgeon: Toledo, Benay Pike, MD;  Location: [facility] ENDOSCOPY;  Service: Gastroenterology;  Laterality: N/A;  . PATENT DUCTUS ARTERIOUS REPAIR    . RECONSTRUCTION TENDON PULLEY HAND Left   . TOTAL HIP ARTHROPLASTY Right     There were no vitals filed for this visit.   Subjective Assessment - 09/29/20 1407    Subjective Pt reports pain 8/10 in her knees. Pt reports biopsy showed mass was benign. She says she has follow-up tomorrow. Pt reports migraine since yesterday.    Pertinent History Per chart: PMH includes: Lupus, seronegative arthritis, fibromyalgia, osteoporosis, breast cancer(radiation), morbid obesity, DVTs, IBS,COPD, chronic migraine, lymphedema, thrombocytopenia. Seizures, RA, MS? back pain that radiates down into both hips and legs. She has bilateral foot numbness. Toes will turn purple.    Limitations Sitting;Walking    How long can you sit comfortably? Pt says sitting limited to 20 minutes,  worsened with spinal extension and improved with flexion.    How long can you stand comfortably? no reported difficulty    How long can you walk comfortably? Pt reports difficulty with prolonged walking    Diagnostic tests MRI done on 05/10/2020 (see physician note)    Patient Stated Goals Pt would like to increae LE strength and decrease pain in order to walk her dog    Pain Onset More than a month ago           Treatment  Therapeutic Exercise:  Nustep level 0, x5 min VC for SPM 60s-76s; pt reports no pain with exercise Seated marches 2.5# - 3x15  Seated hip adduction with red ball -- 3x15 pt reports  exercise between easy-medium   Neuromuscular Re-education: CGA-to min assist provided  Airex pad: pt used intermittent UE support to 2-finger support Tandem stance 2x30 sec B LEs Feet together 4x30 sec  Tandem stance B LEs 2x30 sec each LE  SLB - 2x30 sec on each LE  Ankle-rocker on board - 2x20. VC for technique.  Tandem-walking - approx 10 meters 4x pt required up to min assist to maintain balance  Backward walking - approx 10 meters 2x     PT Education - 09/29/20 1504    Education Details ankle rockers on board, body mechanics    Person(s) Educated Patient    Methods Explanation;Demonstration;Verbal cues    Comprehension Returned demonstration;Verbalized understanding            PT Short Term Goals - 08/17/20 1331      PT SHORT TERM GOAL #1   Title Patient will be independent in home exercise program to improve strength/mobility for better functional independence with ADLs.    Baseline 08/17/2020 Pt issued HEP    Period Weeks    Status New    Target Date 09/14/20             PT Long Term Goals - 08/17/20 1332      PT LONG TERM GOAL #1   Title Patient will increase FOTO score to equal to or greater than 60 to demonstrate statistically significant improvement in mobility and quality of life.    Baseline 08/17/2020 Pt score 55 (risk-adjusted 50)    Time 8    Period Weeks    Status New    Target Date 10/12/20      PT LONG TERM GOAL #2   Title Pt will increase MMT scores by at least 1/2 point to indicate increased B LE strength for performing functional activities.    Baseline 08/17/2020 MMT L/R hip flexion 3/5, 3/5; hip abduction 3/5, 3+/5; hip adduction 2+/5, 3-/5; knee flexion 4-/5, 3+/5, knee extension 4-/5, 3/5; ankle 3/5 B; ankle plantarflexion 4/5 B    Time 8    Period Weeks    Status New    Target Date 10/12/20      PT LONG TERM GOAL #3   Title Patient will increase 10 meter walk test to >1.37m/s as to improve gait speed for better community ambulation  and to reduce fall risk.    Baseline 08/17/2020 0.65 m/s no AD    Time 8    Period Weeks    Status New    Target Date 10/12/20      PT LONG TERM GOAL #4   Title Patient (< 19 years old) will complete five times sit to stand test in < 10 seconds indicating an increased LE strength and improved balance.  Baseline 08/17/2020 5xSTS completed in 14.98 sec    Time 8    Period Weeks    Status New    Target Date 10/12/20      PT LONG TERM GOAL #5   Title Patient will increase Berg Balance score by > 6 points to demonstrate decreased fall risk during functional activities.    Baseline 08/17/2020 Berg score 50/56    Time 8    Period Weeks    Target Date 10/12/20                 Plan - 09/29/20 1455    Clinical Impression Statement Pt migraine today impacting performance in therapy. Majority of session focused on balance training. Pt greatest difficulty with dynamic balance tasks such as tandem walking, where pt required up to min assist x1 to maintain balance. Pt also had difficulty with static balance tasks tandem stance (greater difficulty with RLE as stance leg>L) and SLB. Pt will benefit from further skilled therapy to improve BLE strength, balance, to decrease pain to improve QOL.    Personal Factors and Comorbidities Comorbidity 1;Comorbidity 2;Comorbidity 3+;Fitness    Comorbidities pertinent: hx of thrombosis, chronic fatigue, osteoporisis, HA disorder, polyathralgia, COPD, IBS, Fibromyalgia, lupus    Examination-Activity Limitations Stairs;Transfers;Locomotion Level;Sleep;Squat;Stand;Reach Overhead    Examination-Participation Restrictions Community Activity;Meal Prep;Laundry;Shop;Volunteer;Yard Work;Interpersonal Relationship    Stability/Clinical Decision Making Evolving/Moderate complexity    Rehab Potential Good    PT Frequency 2x / week    PT Duration 8 weeks    PT Treatment/Interventions ADLs/Self Care Home Management;Electrical Stimulation;Moist Heat;DME Instruction;Gait  training;Stair training;Cryotherapy;Ultrasound;Functional mobility training;Neuromuscular re-education;Balance training;Therapeutic exercise;Therapeutic activities;Patient/family education;Orthotic Fit/Training;Manual techniques;Passive range of motion;Energy conservation;Taping;Canalith Repostioning;Vestibular;Visual/perceptual remediation/compensation    PT Next Visit Plan Obstacle course with focus on stepping over obstacles and navigating around cones; progress leg press; SLB    PT Home Exercise Plan see HEP (medbridge link)    Consulted and Agree with Plan of Care Patient           Patient will benefit from skilled therapeutic intervention in order to improve the following deficits and impairments:  Abnormal gait,Decreased activity tolerance,Decreased balance,Decreased endurance,Decreased coordination,Decreased mobility,Decreased strength,Decreased range of motion,Pain,Improper body mechanics,Postural dysfunction,Impaired flexibility,Difficulty walking,Cardiopulmonary status limiting activity,Decreased knowledge of use of DME,Obesity  Visit Diagnosis: Unsteadiness on feet  Muscle weakness (generalized)  Gait abnormality  Pain of right lower extremity     Problem List Patient Active Problem List   Diagnosis Date Noted  . Vitamin D deficiency 08/22/2019  . History of thrombosis 08/22/2019  . Chronic fatigue 07/17/2019  . Osteoporosis 05/12/2019  . STEC (Shiga toxin-producing Escherichia coli) infection 04/06/2019  . Diverticulitis 04/06/2019  . Thrombocytopenia ([place]) 01/23/2019  . Leukopenia 01/23/2019  . Lymphedema of both lower extremities 01/16/2019  . Polyarthralgia 10/14/2018  . Headache disorder 10/14/2018  . Chronic daily headache 10/11/2018  . COPD (chronic obstructive pulmonary disease) ([place]) 10/11/2018  . Thoracic aortic atherosclerosis ([place]) 10/11/2018  . Multiple episodes of deep venous thrombosis ([place]) 09/17/2018  . IBS (irritable bowel syndrome) 09/17/2018   . Healthcare maintenance 09/17/2018  . Primary osteoarthritis of right hip 09/03/2018  . Presence of right hip implant 09/03/2018  . Morbid obesity with BMI of 45.0-49.9, adult ([place]) 08/23/2018  . Shortness of breath 08/22/2018  . History of ductal carcinoma in situ (DCIS) of breast 07/20/2018  . Aromatase inhibitor use 07/20/2018  . Abnormal findings on auscultation 07/16/2018  . Encounter for long-term (current) use of high-risk medication 07/16/2018  . Fibromyalgia 07/16/2018  . Other  forms of systemic lupus erythematosus ([place]) 07/16/2018  . Other osteoporosis without current pathological fracture 07/16/2018  . Seronegative arthritis 07/16/2018   Ricard Dillon PT, DPT 09/29/2020, 3:05 PM  [place] MAIN [facility] SERVICES [address] [place], [place], [address] Phone: [phone]   Fax:  [phone]  Name: Victoria [name] MRN: [id number] Date of Birth: [birth year]

## 2020-09-30 ENCOUNTER — Ambulatory Visit: Payer: Medicare Other | Admitting: Physical Therapy

## 2020-09-30 ENCOUNTER — Other Ambulatory Visit: Payer: Medicare Other

## 2020-09-30 ENCOUNTER — Other Ambulatory Visit: Payer: Self-pay

## 2020-09-30 ENCOUNTER — Ambulatory Visit: Payer: Medicare Other

## 2020-09-30 ENCOUNTER — Ambulatory Visit: Payer: Medicare Other | Admitting: Oncology

## 2020-09-30 DIAGNOSIS — Z86000 Personal history of in-situ neoplasm of breast: Secondary | ICD-10-CM

## 2020-09-30 DIAGNOSIS — D500 Iron deficiency anemia secondary to blood loss (chronic): Secondary | ICD-10-CM

## 2020-10-01 ENCOUNTER — Inpatient Hospital Stay: Payer: Medicare Other | Attending: Oncology | Admitting: Oncology

## 2020-10-01 ENCOUNTER — Encounter: Payer: Self-pay | Admitting: Oncology

## 2020-10-01 ENCOUNTER — Inpatient Hospital Stay: Payer: Medicare Other

## 2020-10-01 ENCOUNTER — Ambulatory Visit: Payer: Medicare Other

## 2020-10-01 ENCOUNTER — Other Ambulatory Visit: Payer: Self-pay

## 2020-10-01 VITALS — BP 121/68 | HR 77 | Resp 18

## 2020-10-01 VITALS — BP 109/69 | HR 79 | Temp 96.9°F | Resp 18 | Wt 194.2 lb

## 2020-10-01 DIAGNOSIS — Z86000 Personal history of in-situ neoplasm of breast: Secondary | ICD-10-CM

## 2020-10-01 DIAGNOSIS — Z923 Personal history of irradiation: Secondary | ICD-10-CM | POA: Diagnosis not present

## 2020-10-01 DIAGNOSIS — M810 Age-related osteoporosis without current pathological fracture: Secondary | ICD-10-CM

## 2020-10-01 DIAGNOSIS — Z79811 Long term (current) use of aromatase inhibitors: Secondary | ICD-10-CM | POA: Diagnosis not present

## 2020-10-01 DIAGNOSIS — D0512 Intraductal carcinoma in situ of left breast: Secondary | ICD-10-CM | POA: Diagnosis present

## 2020-10-01 DIAGNOSIS — M6281 Muscle weakness (generalized): Secondary | ICD-10-CM

## 2020-10-01 DIAGNOSIS — M79604 Pain in right leg: Secondary | ICD-10-CM

## 2020-10-01 DIAGNOSIS — M818 Other osteoporosis without current pathological fracture: Secondary | ICD-10-CM | POA: Diagnosis present

## 2020-10-01 DIAGNOSIS — D500 Iron deficiency anemia secondary to blood loss (chronic): Secondary | ICD-10-CM

## 2020-10-01 DIAGNOSIS — M069 Rheumatoid arthritis, unspecified: Secondary | ICD-10-CM | POA: Diagnosis not present

## 2020-10-01 DIAGNOSIS — R2681 Unsteadiness on feet: Secondary | ICD-10-CM

## 2020-10-01 DIAGNOSIS — Z95828 Presence of other vascular implants and grafts: Secondary | ICD-10-CM | POA: Diagnosis not present

## 2020-10-01 DIAGNOSIS — R269 Unspecified abnormalities of gait and mobility: Secondary | ICD-10-CM

## 2020-10-01 LAB — CBC
HCT: 39.2 % (ref 36.0–46.0)
Hemoglobin: 13 g/dL (ref 12.0–15.0)
MCH: 30 pg (ref 26.0–34.0)
MCHC: 33.2 g/dL (ref 30.0–36.0)
MCV: 90.3 fL (ref 80.0–100.0)
Platelets: 135 10*3/uL — ABNORMAL LOW (ref 150–400)
RBC: 4.34 MIL/uL (ref 3.87–5.11)
RDW: 14 % (ref 11.5–15.5)
WBC: 4.2 10*3/uL (ref 4.0–10.5)
nRBC: 0 % (ref 0.0–0.2)

## 2020-10-01 LAB — COMPREHENSIVE METABOLIC PANEL
ALT: 20 U/L (ref 0–44)
AST: 23 U/L (ref 15–41)
Albumin: 4 g/dL (ref 3.5–5.0)
Alkaline Phosphatase: 58 U/L (ref 38–126)
Anion gap: 9 (ref 5–15)
BUN: 17 mg/dL (ref 6–20)
CO2: 22 mmol/L (ref 22–32)
Calcium: 8.7 mg/dL — ABNORMAL LOW (ref 8.9–10.3)
Chloride: 107 mmol/L (ref 98–111)
Creatinine, Ser: 0.8 mg/dL (ref 0.44–1.00)
GFR, Estimated: >60 mL/min (ref >60)
Glucose, Bld: 130 mg/dL — ABNORMAL HIGH (ref 70–99)
Potassium: 3.6 mmol/L (ref 3.5–5.1)
Sodium: 138 mmol/L (ref 135–145)
Total Bilirubin: 0.4 mg/dL (ref 0.3–1.2)
Total Protein: 6.8 g/dL (ref 6.5–8.1)

## 2020-10-01 MED ORDER — SODIUM CHLORIDE 0.9 % IV SOLN
INTRAVENOUS | Status: DC
  Filled 2020-10-01: qty 250

## 2020-10-01 MED ORDER — ZOLEDRONIC ACID 4 MG/100ML IV SOLN
4.0000 mg | Freq: Once | INTRAVENOUS | Status: AC
  Administered 2020-10-01: 4 mg via INTRAVENOUS
  Filled 2020-10-01: qty 100

## 2020-10-01 MED ORDER — HEPARIN SOD (PORK) LOCK FLUSH 100 UNIT/ML IV SOLN
INTRAVENOUS | Status: AC
  Filled 2020-10-01: qty 5

## 2020-10-01 NOTE — Progress Notes (Signed)
patient here for follow up. No new concerns voiced. No new breast problems.

## 2020-10-01 NOTE — Therapy (Signed)
[place] MAIN [facility] SERVICES [address] [place], [place], [address] Phone: [phone]   Fax:  [phone]  Physical Therapy Treatment  Patient Details  Name: Victoria [name] MRN: [id number] Date of Birth: 01/21/1961 Referring Provider (PT): Vladimir Crofts, MD   Encounter Date: 10/01/2020   PT End of Session - 10/01/20 1027    Visit Number 8    Number of Visits 17    Date for PT Re-Evaluation 10/12/20    PT Start Time 0937    PT Stop Time 1020    PT Time Calculation (min) 43 min    Equipment Utilized During Treatment Gait belt    Activity Tolerance Patient tolerated treatment well    Behavior During Therapy [place] for tasks assessed/performed           Past Medical History:  Diagnosis Date  . Allergy   . Asthma   . Cancer ([place])    Breast Left, DCIS 2018  . Collagen vascular disease (HCC)    Lupus  . COPD (chronic obstructive pulmonary disease) ([place])   . Diverticulitis   . Fibromyalgia   . GERD (gastroesophageal reflux disease)   . Headache    Migraines  . Hearing impaired person, left    bilateral hearing aids  . IBS (irritable bowel syndrome)   . Lupus ([place])   . Mitral valve disorder   . MS (multiple sclerosis) ([place])   . Neuromuscular disorder ([place])   . Neuropathy   . Osteoarthritis   . Osteoporosis 05/12/2019  . Personal history of radiation therapy 2017   left   . Rheumatoid arthritis (HCC)    hands and legs  . Seizures ([place])    no seizures for 10 years    Past Surgical History:  Procedure Laterality Date  . ABDOMINAL HYSTERECTOMY    . APPENDECTOMY    . BREAST EXCISIONAL BIOPSY Left 2017   lumpectomy with radation  . BREAST EXCISIONAL BIOPSY Left 2004   neg surgical bx   . BREAST LUMPECTOMY Left 2017   DCIS  . BREAST SURGERY    . CHOLECYSTECTOMY    . COLONOSCOPY WITH PROPOFOL N/A 07/07/2019   Procedure: COLONOSCOPY WITH PROPOFOL;  Surgeon: Toledo, Benay Pike, MD;  Location: [facility];  Service:  Gastroenterology;  Laterality: N/A;  . DIRECT LARYNGOSCOPY N/A 09/22/2020   Procedure: SUSPENSION MICRODIRECT LARYNGOSCOPY WITH BIOPSY;  Surgeon: Carloyn Manner, MD;  Location: [place];  Service: ENT;  Laterality: N/A;  has port a cath needs accessed  . ESOPHAGOGASTRODUODENOSCOPY (EGD) WITH PROPOFOL N/A 07/07/2019   Procedure: ESOPHAGOGASTRODUODENOSCOPY (EGD) WITH PROPOFOL;  Surgeon: Toledo, Benay Pike, MD;  Location: [facility];  Service: Gastroenterology;  Laterality: N/A;  . PATENT DUCTUS ARTERIOUS REPAIR    . RECONSTRUCTION TENDON PULLEY HAND Left   . TOTAL HIP ARTHROPLASTY Right     There were no vitals filed for this visit.   Subjective Assessment - 10/01/20 0940    Subjective Pt reports she has 3 appointments at the [facility] today for follow-up screening regarding her hx of breast CA. Pt reports she also has future follow-up March 28th in [facility]. Pt reports R knee pain today 8/10.    Pertinent History Per chart: PMH includes: Lupus, seronegative arthritis, fibromyalgia, osteoporosis, breast cancer(radiation), morbid obesity, DVTs, IBS,COPD, chronic migraine, lymphedema, thrombocytopenia. Seizures, RA, MS? back pain that radiates down into both hips and legs. She has bilateral foot numbness. Toes will turn purple.    Limitations Sitting;Walking  How long can you sit comfortably? Pt says sitting limited to 20 minutes, worsened with spinal extension and improved with flexion.    How long can you stand comfortably? no reported difficulty    How long can you walk comfortably? Pt reports difficulty with prolonged walking    Diagnostic tests MRI done on 05/10/2020 (see physician note)    Patient Stated Goals Pt would like to increae LE strength and decrease pain in order to walk her dog    Currently in Pain? Yes    Pain Score 8     Pain Location Knee    Pain Orientation Right    Pain Onset More than a month ago           Treatment  Nustep level 2 x 4 min SPM  50s-60s; level 3 x 6 min; pt SPM dropped to 50s with level 3. No reports of increased knee pain, and pt rates level 3 as "medium difficulty."  Ankle inversion with YTB - 3x10 B LEs, demonstration, VC/TC for technique  Ankle eversion with YTB - 3x10 B LEs, demonstration, VC/TC for technique  Semi-tandem stance on airex pad - x multiple rounds for several minutes each LE. CGA provided. Pt with increased postural sway, with only a few instances of grabbing bar for UE support.  Tandem-stance on airex pad - multiple rounds x several minutes each round for B LEs. Pt requires CGA and intermittent UE support on bar throughout. Greatest difficulty on L LE.   Semi-tandem walking - 6x in clinic gym.  Pt with multiple LOB demonstrating stepping strategy, mid-guard and requiring up to min assist x1 to regain balance.   Tandem-walking - 6x in clinic gym. Pt still with multiple LOB, requiring up to min assist x1 from PT to regain balance.   Backward walking - 4x. CGA provided. VC for increased step length B. Pt deviates from straight path to R side >6".   SLB at bar - 3x approx 60 sec each LE. Intermittent UE support - two finger support on bar required to maintain balance and CGA from PT. Multiple instances of toe-touch observed as well to regain balance. Pt with greatest difficulty on L LE.     PT Short Term Goals - 08/17/20 1331      PT SHORT TERM GOAL #1   Title Patient will be independent in home exercise program to improve strength/mobility for better functional independence with ADLs.    Baseline 08/17/2020 Pt issued HEP    Period Weeks    Status New    Target Date 09/14/20             PT Long Term Goals - 08/17/20 1332      PT LONG TERM GOAL #1   Title Patient will increase FOTO score to equal to or greater than 60 to demonstrate statistically significant improvement in mobility and quality of life.    Baseline 08/17/2020 Pt score 55 (risk-adjusted 50)    Time 8    Period Weeks     Status New    Target Date 10/12/20      PT LONG TERM GOAL #2   Title Pt will increase MMT scores by at least 1/2 point to indicate increased B LE strength for performing functional activities.    Baseline 08/17/2020 MMT L/R hip flexion 3/5, 3/5; hip abduction 3/5, 3+/5; hip adduction 2+/5, 3-/5; knee flexion 4-/5, 3+/5, knee extension 4-/5, 3/5; ankle 3/5 B; ankle plantarflexion 4/5 B    Time  8    Period Weeks    Status New    Target Date 10/12/20      PT LONG TERM GOAL #3   Title Patient will increase 10 meter walk test to >1.8m/s as to improve gait speed for better community ambulation and to reduce fall risk.    Baseline 08/17/2020 0.65 m/s no AD    Time 8    Period Weeks    Status New    Target Date 10/12/20      PT LONG TERM GOAL #4   Title Patient (< 61 years old) will complete five times sit to stand test in < 10 seconds indicating an increased LE strength and improved balance.    Baseline 08/17/2020 5xSTS completed in 14.98 sec    Time 8    Period Weeks    Status New    Target Date 10/12/20      PT LONG TERM GOAL #5   Title Patient will increase Berg Balance score by > 6 points to demonstrate decreased fall risk during functional activities.    Baseline 08/17/2020 Berg score 50/56    Time 8    Period Weeks    Target Date 10/12/20             Plan - 10/01/20 1028    Clinical Impression Statement Pt with impaired strength of ankle invertors and evertors B with greatest deficits on LLE, indicating decrease in ankle stability likely impacting pt's balance. Pt shows slight improvement today in dynamic and static balance with tandem stance and walking, likely impacted by resolution of migraine sx. Pt achieving 1-3 seconds of SLB B, requiring intermittent UE support. PT educated pt in ankle strengthening exercises and issued pt RTB and YTB for home use. Pt will benefit from further skilled therapy to improve gait, LE strength and balance to improve safety with all functional  mobility.    Personal Factors and Comorbidities Comorbidity 1;Comorbidity 2;Comorbidity 3+;Fitness    Comorbidities pertinent: hx of thrombosis, chronic fatigue, osteoporisis, HA disorder, polyathralgia, COPD, IBS, Fibromyalgia, lupus    Examination-Activity Limitations Stairs;Transfers;Locomotion Level;Sleep;Squat;Stand;Reach Overhead    Examination-Participation Restrictions Community Activity;Meal Prep;Laundry;Shop;Volunteer;Yard Work;Interpersonal Relationship    Stability/Clinical Decision Making Evolving/Moderate complexity    Rehab Potential Good    PT Frequency 2x / week    PT Duration 8 weeks    PT Treatment/Interventions ADLs/Self Care Home Management;Electrical Stimulation;Moist Heat;DME Instruction;Gait training;Stair training;Cryotherapy;Ultrasound;Functional mobility training;Neuromuscular re-education;Balance training;Therapeutic exercise;Therapeutic activities;Patient/family education;Orthotic Fit/Training;Manual techniques;Passive range of motion;Energy conservation;Taping;Canalith Repostioning;Vestibular;Visual/perceptual remediation/compensation    PT Next Visit Plan Obstacle course with focus on stepping over obstacles and navigating around cones; progress leg press; SLB; advance ankle strengthening    PT Home Exercise Plan see HEP (medbridge link)    Consulted and Agree with Plan of Care Patient           Patient will benefit from skilled therapeutic intervention in order to improve the following deficits and impairments:  Abnormal gait,Decreased activity tolerance,Decreased balance,Decreased endurance,Decreased coordination,Decreased mobility,Decreased strength,Decreased range of motion,Pain,Improper body mechanics,Postural dysfunction,Impaired flexibility,Difficulty walking,Cardiopulmonary status limiting activity,Decreased knowledge of use of DME,Obesity  Visit Diagnosis: Unsteadiness on feet  Muscle weakness (generalized)  Gait abnormality  Pain of right lower  extremity     Problem List Patient Active Problem List   Diagnosis Date Noted  . Vitamin D deficiency 08/22/2019  . History of thrombosis 08/22/2019  . Chronic fatigue 07/17/2019  . Osteoporosis 05/12/2019  . STEC (Shiga toxin-producing Escherichia coli) infection 04/06/2019  . Diverticulitis 04/06/2019  .  Thrombocytopenia ([place]) 01/23/2019  . Leukopenia 01/23/2019  . Lymphedema of both lower extremities 01/16/2019  . Polyarthralgia 10/14/2018  . Headache disorder 10/14/2018  . Chronic daily headache 10/11/2018  . COPD (chronic obstructive pulmonary disease) ([place]) 10/11/2018  . Thoracic aortic atherosclerosis ([place]) 10/11/2018  . Multiple episodes of deep venous thrombosis ([place]) 09/17/2018  . IBS (irritable bowel syndrome) 09/17/2018  . Healthcare maintenance 09/17/2018  . Primary osteoarthritis of right hip 09/03/2018  . Presence of right hip implant 09/03/2018  . Morbid obesity with BMI of 45.0-49.9, adult ([place]) 08/23/2018  . Shortness of breath 08/22/2018  . History of ductal carcinoma in situ (DCIS) of breast 07/20/2018  . Aromatase inhibitor use 07/20/2018  . Abnormal findings on auscultation 07/16/2018  . Encounter for long-term (current) use of high-risk medication 07/16/2018  . Fibromyalgia 07/16/2018  . Other forms of systemic lupus erythematosus ([place]) 07/16/2018  . Other osteoporosis without current pathological fracture 07/16/2018  . Seronegative arthritis 07/16/2018   Ricard Dillon PT, DPT 10/01/2020, 10:48 AM  [place] MAIN [facility] SERVICES [address] [place], [place], [address] Phone: [phone]   Fax:  [phone]  Name: Victoria [name] MRN: [id number] Date of Birth: 1961/07/11

## 2020-10-01 NOTE — Progress Notes (Signed)
Hematology/Oncology follow up note [facility] [phone] [phone]   Patient Care Team: Kirk Ruths, MD as PCP - General (Internal Medicine) Earlie Server, MD as Consulting Physician (Oncology)  REFERRING PROVIDER: Kirk Ruths, MD CHIEF COMPLAINTS/REASON FOR VISIT:  Follow up for DCIS HISTORY OF PRESENTING ILLNESS:  Victoria [name] is a  60 y.o.  female with PMH listed below who was referred to me for evaluation of history of breast cancer Patient moved from [place]. Also report having left lower extremity blood clot 3 years ago, started on Eliquis for a month and developed pulmonary embolism.  Switch to Pradaxa.  She also had IVC filter.  After she moved to [place], she was temporarily off Pradaxa.  Currently back on anticoagulation. She presented to emergency room on 06/25/2018 for evaluation of right arm pain, complaining about developing knots right upper extremity.  # DCIS:  I obtained previous oncology history from [place]. Medical records were reviewed by me.  Patient was diagnosed with left breast DCIS in 2017, s/p lumpectomy and radiation.  Initially was started on Arimidex however cannot tolerate and was switched to Letrozole since Dec 2017.   # rheumatoid arthritis/lupus follows up with Dr. Cydney Ok.  No rashes follow-up with Dr. Carlyn Reichert   # history of unprovoked VTE, with IVC filter,  recurrent, on Pradaxa # [place] left thigh showed most likely lipoma.  She has also established care with Rheumatology. started on Leflunomide 20mg  daily since March 2020.  Also has been on Plaquenil 200mg    # Chronic anemia and thrombocytopenia. Previous work-up includes normal LDH, folate and vitamin B12 level, negative hepatitis panel, HIV negative.  Negative SPEP-[facility].  Normal immature platelet fraction indicating underproduction. # [id number] pneumonia in December 2020 # 11/27/2019 unilateral  left diagnostic mammogram done on  for evaluation of self palpated mass.  Study showed no mammographic or sonographic evidence of malignancy in the lower outer quadrant of left breast.   INTERVAL HISTORY Victoria [name] is a 60 y.o. female who has above history reviewed by me today presents for follow up visit for management of history of DCIS.  Patient takes letrozole, tolerated well.   Patient has Mediport, flush every 8 weeks.  No new complaints. Did not get a mammogram done. She recently evaluated by ENT underwent laryngoscopy with biopsy of the vocal fold.  Review of Systems  Constitutional: Positive for fatigue. Negative for appetite change, chills and fever.  HENT:   Negative for hearing loss and voice change.   Eyes: Negative for eye problems.  Respiratory: Negative for chest tightness, cough and shortness of breath.   Cardiovascular: Negative for chest pain.  Gastrointestinal: Negative for abdominal distention, abdominal pain and blood in stool.  Endocrine: Positive for hot flashes.  Genitourinary: Negative for difficulty urinating and frequency.   Musculoskeletal: Positive for arthralgias. Negative for back pain and myalgias.  Skin: Negative for itching and rash.  Neurological: Negative for extremity weakness.  Hematological: Negative for adenopathy.  Psychiatric/Behavioral: Negative for confusion.    MEDICAL HISTORY:  Past Medical History:  Diagnosis Date  . Allergy   . Asthma   . Cancer ([place])    Breast Left, DCIS 2018  . Collagen vascular disease (HCC)    Lupus  . COPD (chronic obstructive pulmonary disease) ([place])   . Diverticulitis   . Fibromyalgia   . GERD (gastroesophageal reflux disease)   . Headache    Migraines  . Hearing impaired person, left  bilateral hearing aids  . IBS (irritable bowel syndrome)   . Lupus ([place])   . Mitral valve disorder   . MS (multiple sclerosis) ([place])   . Neuromuscular disorder ([place])   . Neuropathy   . Osteoarthritis   .  Osteoporosis 05/12/2019  . Personal history of radiation therapy 2017   left   . Rheumatoid arthritis (HCC)    hands and legs  . Seizures ([place])    no seizures for 10 years    SURGICAL HISTORY: Past Surgical History:  Procedure Laterality Date  . ABDOMINAL HYSTERECTOMY    . APPENDECTOMY    . BREAST EXCISIONAL BIOPSY Left 2017   lumpectomy with radation  . BREAST EXCISIONAL BIOPSY Left 2004   neg surgical bx   . BREAST LUMPECTOMY Left 2017   DCIS  . BREAST SURGERY    . CHOLECYSTECTOMY    . COLONOSCOPY WITH PROPOFOL N/A 07/07/2019   Procedure: COLONOSCOPY WITH PROPOFOL;  Surgeon: Toledo, Benay Pike, MD;  Location: [facility] ENDOSCOPY;  Service: Gastroenterology;  Laterality: N/A;  . DIRECT LARYNGOSCOPY N/A 09/22/2020   Procedure: SUSPENSION MICRODIRECT LARYNGOSCOPY WITH BIOPSY;  Surgeon: Carloyn Manner, MD;  Location: [facility];  Service: ENT;  Laterality: N/A;  has port a cath needs accessed  . ESOPHAGOGASTRODUODENOSCOPY (EGD) WITH PROPOFOL N/A 07/07/2019   Procedure: ESOPHAGOGASTRODUODENOSCOPY (EGD) WITH PROPOFOL;  Surgeon: Toledo, Benay Pike, MD;  Location: [facility] ENDOSCOPY;  Service: Gastroenterology;  Laterality: N/A;  . PATENT DUCTUS ARTERIOUS REPAIR    . RECONSTRUCTION TENDON PULLEY HAND Left   . TOTAL HIP ARTHROPLASTY Right     SOCIAL HISTORY: Social History   Socioeconomic History  . Marital status: Widowed    Spouse name: Not on file  . Number of children: 1  . Years of education: Not on file  . Highest education level: Some college, no degree  Occupational History  . Not on file  Tobacco Use  . Smoking status: Former Smoker    Packs/day: 2.00    Years: 15.00    Pack years: 30.00    Types: Cigarettes    Quit date: 06/27/1999    Years since quitting: 21.2  . Smokeless tobacco: Never Used  Vaping Use  . Vaping Use: Never used  Substance and Sexual Activity  . Alcohol use: Yes    Comment: Rarely - approx 6 beers once a month  . Drug use: Never  .  Sexual activity: Not Currently  Other Topics Concern  . Not on file  Social History Narrative  . Not on file   Social Determinants of Health   Financial Resource Strain: Not on file  Food Insecurity: Not on file  Transportation Needs: Not on file  Physical Activity: Not on file  Stress: Not on file  Social Connections: Not on file  Intimate Partner Violence: Not on file    FAMILY HISTORY: Family History  Problem Relation Age of Onset  . COPD Mother   . Stroke Father   . Arthritis Brother   . Diabetes Brother   . Stroke Brother   . Breast cancer Neg Hx     ALLERGIES:  is allergic to other, sodium hypochlorite, tape, codeine, flagyl [metronidazole], levofloxacin, and sulfa antibiotics.  MEDICATIONS:  Current Outpatient Medications  Medication Sig Dispense Refill  . albuterol (PROVENTIL HFA;VENTOLIN HFA) 108 (90 Base) MCG/ACT inhaler Inhale 2 puffs into the lungs every 6 (six) hours as needed for wheezing or shortness of breath. 1 Inhaler 2  . Ascorbic Acid (VITAMIN C) 1000 MG  tablet Take 1,000 mg by mouth daily.    Marland Kitchen atorvastatin (LIPITOR) 40 MG tablet Take 40 mg by mouth daily.    Marland Kitchen azelastine (ASTELIN) 0.1 % nasal spray Place 2 sprays into both nostrils 2 (two) times daily.    . budesonide-formoterol (SYMBICORT) 160-4.5 MCG/ACT inhaler Inhale 2 puffs into the lungs 2 (two) times daily. 1 Inhaler 12  . cholecalciferol (VITAMIN D3) 25 MCG (1000 UNIT) tablet Take 1,000 Units by mouth daily.    . cyclobenzaprine (FLEXERIL) 10 MG tablet TAKE 1 TABLET BY MOUTH TWICE A DAY AS NEEDED FOR MUSCLE SPASMS 40 tablet 0  . dabigatran (PRADAXA) 150 MG CAPS capsule Take 1 capsule (150 mg total) by mouth 2 (two) times daily. 180 capsule 3  . fluticasone (FLONASE) 50 MCG/ACT nasal spray Place into both nostrils.    . folic acid (FOLVITE) 1 MG tablet Take by mouth.    . gabapentin (NEURONTIN) 100 MG capsule 200 mg 3 (three) times daily.     Marland Kitchen HYDROcodone-acetaminophen (NORCO) 7.5-325 MG  tablet Take 1 tablet by mouth every 8 (eight) hours as needed for severe pain. Must last 30 days. 90 tablet 0  . [START ON 10/10/2020] HYDROcodone-acetaminophen (NORCO) 7.5-325 MG tablet Take 1 tablet by mouth every 8 (eight) hours as needed for severe pain. Must last 30 days. 90 tablet 0  . hydroxychloroquine (PLAQUENIL) 200 MG tablet Take 200 mg by mouth daily.    . Ipratropium-Albuterol (COMBIVENT RESPIMAT) 20-100 MCG/ACT AERS respimat INHALE 2 PUFFS BY MOUTH INTO THE LUNGS EVERY 4 TIMES DAILY AS NEEDED FOR WHEEZING    . letrozole (FEMARA) 2.5 MG tablet TAKE 1 TABLET BY MOUTH ONCE DAILY 30 tablet 3  . methotrexate 2.5 MG tablet Take 2.5 mg by mouth once a week.    Marland Kitchen omeprazole (PRILOSEC) 40 MG capsule Take 1 capsule (40 mg total) by mouth 2 (two) times daily. 90 capsule 1  . ondansetron (ZOFRAN) 4 MG tablet Take 1 tablet (4 mg total) by mouth every 8 (eight) hours as needed for up to 10 doses for nausea or vomiting. 20 tablet 0  . ondansetron (ZOFRAN-ODT) 8 MG disintegrating tablet Take by mouth every 8 (eight) hours as needed.     . Probiotic Product (PROBIOTIC-10) CHEW Chew by mouth daily.    . Tiotropium Bromide Monohydrate (SPIRIVA RESPIMAT) 2.5 MCG/ACT AERS Inhale 2 puffs into the lungs every morning. 1 Inhaler 11  . topiramate (TOPAMAX) 50 MG tablet Take 50 mg by mouth 2 (two) times daily.    . traZODone (DESYREL) 50 MG tablet Take 3 tablets (150 mg total) by mouth at bedtime. 180 tablet 1  . benzonatate (TESSALON) 100 MG capsule Take 1 capsule (100 mg total) by mouth 3 (three) times daily as needed. (Patient not taking: No sig reported) 30 capsule 0   No current facility-administered medications for this visit.   Facility-Administered Medications Ordered in Other Visits  Medication Dose Route Frequency Provider Last Rate Last Admin  . sodium chloride flush (NS) 0.9 % injection 10 mL  10 mL Intravenous PRN Earlie Server, MD   10 mL at 01/22/19 1503     PHYSICAL EXAMINATION: ECOG PERFORMANCE  STATUS: 1 - Symptomatic but completely ambulatory Vitals:   10/01/20 1315  BP: 109/69  Pulse: 79  Resp: 18  Temp: (!) 96.9 F (36.1 C)   Filed Weights   10/01/20 1315  Weight: 194 lb 3.2 oz (88.1 kg)    Physical Exam Constitutional:      General:  She is not in acute distress.    Appearance: She is not diaphoretic.     Comments: Obese  HENT:     Head: Normocephalic and atraumatic.     Nose: Nose normal.     Mouth/Throat:     Pharynx: No oropharyngeal exudate.  Eyes:     General: No scleral icterus.    Pupils: Pupils are equal, round, and reactive to light.  Cardiovascular:     Rate and Rhythm: Normal rate and regular rhythm.     Heart sounds: No murmur heard.   Pulmonary:     Effort: Pulmonary effort is normal. No respiratory distress.     Breath sounds: No rales.  Chest:     Chest wall: No tenderness.  Abdominal:     General: There is no distension.     Palpations: Abdomen is soft.     Tenderness: There is no abdominal tenderness.  Musculoskeletal:        General: Normal range of motion.     Cervical back: Normal range of motion and neck supple.  Skin:    General: Skin is warm and dry.     Findings: No erythema.  Neurological:     Mental Status: She is alert and oriented to person, place, and time.     Cranial Nerves: No cranial nerve deficit.     Motor: No abnormal muscle tone.     Coordination: Coordination normal.  Psychiatric:        Mood and Affect: Affect normal.   .   LABORATORY DATA:  I have reviewed the data as listed Lab Results  Component Value Date   WBC 4.2 10/01/2020   HGB 13.0 10/01/2020   HCT 39.2 10/01/2020   MCV 90.3 10/01/2020   PLT 135 (L) 10/01/2020   Recent Labs    11/18/19 1238 12/19/19 1347 02/17/20 0938 08/19/20 1032 10/01/20 1301  NA 140 139 140 137 138  K 3.5 3.7 3.7 3.7 3.6  CL 108 111 110 106 107  CO2 22 22 23  21* 22  GLUCOSE 183* 118* 105* 149* 130*  BUN 14 14 20 16 17   CREATININE 0.70 0.65 0.79 0.76 0.80   CALCIUM 9.2 8.9 9.1 9.1 8.7*  GFRNONAA >60 >60 >60 >60 >60  GFRAA >60 >60 >60  --   --   PROT 6.4*  --  6.3* 6.6 6.8  ALBUMIN 3.9  --  3.9 4.1 4.0  AST 33  --  20 24 23   ALT 36  --  23 28 20   ALKPHOS 46  --  52 64 58  BILITOT 0.4  --  0.3 0.2* 0.4   Iron/TIBC/Ferritin/ %Sat No results found for: IRON, TIBC, FERRITIN, IRONPCTSAT   RADIOGRAPHIC STUDIES: I have personally reviewed the radiological images as listed and agreed with the findings in the report. US venous upper right extremity 06/04/2018  No evidence of DVT within the right upper extremity 07/09/2018 CXR  Interval resolution of subsegmental atelectasis or interstitial edema. Mild chronic bronchitic changes. No acute pneumonia nor pulmonary edema. Thoracic aortic atherosclerosis  CT SOFT TISSUE NECK W CONTRAST  Result Date: 09/02/2020 CLINICAL DATA:  History of left breast carcinoma. Worsening hoarseness. EXAM: CT NECK WITH CONTRAST TECHNIQUE: Multidetector CT imaging of the neck was performed using the standard protocol following the bolus administration of intravenous contrast. CONTRAST:  56mL OMNIPAQUE IOHEXOL 300 MG/ML  SOLN COMPARISON:  None. FINDINGS: PHARYNX AND LARYNX: The nasopharynx, oropharynx and larynx are normal. Visible portions of the oral  cavity, tongue base and floor of mouth are normal. Normal epiglottis, vallecula and pyriform sinuses. The larynx is normal. No retropharyngeal abscess, effusion or lymphadenopathy. SALIVARY GLANDS: Normal parotid, submandibular and sublingual glands. THYROID: Normal. LYMPH NODES: No enlarged or abnormal density lymph nodes. VASCULAR: Major cervical vessels are patent. LIMITED INTRACRANIAL: Normal. VISUALIZED ORBITS: Normal. MASTOIDS AND VISUALIZED PARANASAL SINUSES: No fluid levels or advanced mucosal thickening. No mastoid effusion. SKELETON: No bony spinal canal stenosis. No lytic or blastic lesions. UPPER CHEST: Ground-glass opacities in both lung apices. OTHER: None. IMPRESSION:  1. Normal CT of the neck. 2. Ground-glass opacities in both lung apices, which could indicate infection or pulmonary edema. Electronically Signed   By: Ulyses Jarred M.D.   On: 09/02/2020 19:30    ASSESSMENT & PLAN:  1. History of ductal carcinoma in situ (DCIS) of breast   2. Osteoporosis, unspecified osteoporosis type, unspecified pathological fracture presence   3. Port-A-Cath in place   4. Iron deficiency anemia due to chronic blood loss   5. Aromatase inhibitor use    history of left DICS.  S/p lumpectomy and RT.  Labs reviewed and discussed with patient. Patient is overdue for bilateral diagnostic mammogram.  Will reschedule for her. Continue letrozole.  She will finish about 5 years of letrozole at the end of 2022.  Osteoporosis, continue calcium and vitamin D supplementation. Patient is due to repeat bone density.  Will obtain. Proceed with Zometa today .Marland Kitchen #Port-A-Cath in place, due to lack of peripheral access.  Continue port flush every 8 weeks #History of DVT and PE, patient is on Pradaxa, not managed by me.  Chronic autoimmune disorders/rheumatoid arthritis/lupus: Continue follow-up with rheumatology Dr. Posey Pronto..   Orders Placed This Encounter  Procedures  . MM 3D SCREEN BREAST BILATERAL    Standing Status:   Future    Standing Expiration Date:   10/01/2021    Order Specific Question:   Reason for Exam (SYMPTOM  OR DIAGNOSIS REQUIRED)    Answer:   hx breast cancer    Order Specific Question:   Preferred imaging location?    Answer:   [facility]    Order Specific Question:   Is the patient pregnant?    Answer:   No  . CBC with Differential/Platelet    Standing Status:   Future    Standing Expiration Date:   10/01/2021  . Comprehensive metabolic panel    Standing Status:   Future    Standing Expiration Date:   10/01/2021    All questions were answered. The patient knows to call the clinic with any problems questions or concerns.  Return of visit: 6 months.    Earlie Server, MD, PhD Hematology Oncology [place] [facility] 10/01/2020

## 2020-10-04 ENCOUNTER — Ambulatory Visit: Payer: Medicare Other

## 2020-10-04 ENCOUNTER — Other Ambulatory Visit: Payer: Self-pay | Admitting: Oncology

## 2020-10-04 ENCOUNTER — Other Ambulatory Visit: Payer: Self-pay

## 2020-10-04 DIAGNOSIS — M6281 Muscle weakness (generalized): Secondary | ICD-10-CM

## 2020-10-04 DIAGNOSIS — R2681 Unsteadiness on feet: Secondary | ICD-10-CM

## 2020-10-04 DIAGNOSIS — Z95828 Presence of other vascular implants and grafts: Secondary | ICD-10-CM

## 2020-10-04 DIAGNOSIS — M79604 Pain in right leg: Secondary | ICD-10-CM

## 2020-10-04 DIAGNOSIS — R269 Unspecified abnormalities of gait and mobility: Secondary | ICD-10-CM

## 2020-10-04 NOTE — Therapy (Signed)
[place] [facility] SERVICES [address] [place], [place], [address] Phone: [phone]   Fax:  [phone]  Physical Therapy Treatment  Patient Details  Name: Victoria [name] MRN: [id number] Date of Birth: 16-Jan-1961 Referring Provider (PT): Vladimir Crofts, MD   Encounter Date: 10/04/2020   PT End of Session - 10/04/20 1447    Visit Number 9    Number of Visits 17    Date for PT Re-Evaluation 10/12/20    PT Start Time 1307    PT Stop Time 1346    PT Time Calculation (min) 39 min    Equipment Utilized During Treatment Gait belt    Activity Tolerance Patient tolerated treatment well    Behavior During Therapy [facility] for tasks assessed/performed           Past Medical History:  Diagnosis Date  . Allergy   . Asthma   . Cancer ([place])    Breast Left, DCIS 2018  . Collagen vascular disease (HCC)    Lupus  . COPD (chronic obstructive pulmonary disease) ([place])   . Diverticulitis   . Fibromyalgia   . GERD (gastroesophageal reflux disease)   . Headache    Migraines  . Hearing impaired person, left    bilateral hearing aids  . IBS (irritable bowel syndrome)   . Lupus ([place])   . Mitral valve disorder   . MS (multiple sclerosis) ([place])   . Neuromuscular disorder ([place])   . Neuropathy   . Osteoarthritis   . Osteoporosis 05/12/2019  . Personal history of radiation therapy 2017   left   . Rheumatoid arthritis (HCC)    hands and legs  . Seizures ([place])    no seizures for 10 years    Past Surgical History:  Procedure Laterality Date  . ABDOMINAL HYSTERECTOMY    . APPENDECTOMY    . BREAST EXCISIONAL BIOPSY Left 2017   lumpectomy with radation  . BREAST EXCISIONAL BIOPSY Left 2004   neg surgical bx   . BREAST LUMPECTOMY Left 2017   DCIS  . BREAST SURGERY    . CHOLECYSTECTOMY    . COLONOSCOPY WITH PROPOFOL N/A 07/07/2019   Procedure: COLONOSCOPY WITH PROPOFOL;  Surgeon: Toledo, Benay Pike, MD;  Location: [facility] ENDOSCOPY;  Service:  Gastroenterology;  Laterality: N/A;  . DIRECT LARYNGOSCOPY N/A 09/22/2020   Procedure: SUSPENSION MICRODIRECT LARYNGOSCOPY WITH BIOPSY;  Surgeon: Carloyn Manner, MD;  Location: [facility];  Service: ENT;  Laterality: N/A;  has port a cath needs accessed  . ESOPHAGOGASTRODUODENOSCOPY (EGD) WITH PROPOFOL N/A 07/07/2019   Procedure: ESOPHAGOGASTRODUODENOSCOPY (EGD) WITH PROPOFOL;  Surgeon: Toledo, Benay Pike, MD;  Location: [facility] ENDOSCOPY;  Service: Gastroenterology;  Laterality: N/A;  . PATENT DUCTUS ARTERIOUS REPAIR    . RECONSTRUCTION TENDON PULLEY HAND Left   . TOTAL HIP ARTHROPLASTY Right     There were no vitals filed for this visit.   Subjective Assessment - 10/04/20 1311    Subjective Pt reports 6/10 R knee pain. No other changes reported.    Pertinent History Per chart: PMH includes: Lupus, seronegative arthritis, fibromyalgia, osteoporosis, breast cancer(radiation), morbid obesity, DVTs, IBS,COPD, chronic migraine, lymphedema, thrombocytopenia. Seizures, RA, MS? back pain that radiates down into both hips and legs. She has bilateral foot numbness. Toes will turn purple.    Limitations Sitting;Walking    How long can you sit comfortably? Pt says sitting limited to 20 minutes, worsened with spinal extension and improved with flexion.    How long can you  stand comfortably? no reported difficulty    How long can you walk comfortably? Pt reports difficulty with prolonged walking    Diagnostic tests MRI done on 05/10/2020 (see physician note)    Patient Stated Goals Pt would like to increae LE strength and decrease pain in order to walk her dog    Currently in Pain? Yes    Pain Score 6     Pain Location Knee    Pain Orientation Right    Pain Onset More than a month ago           Treatment  Nustep level 2 x 5 min SPM 70s with dropping into 60s near end of 5 minutes. VC provided to increase SPM, and to maintain slight knee flexion instead of locking out. No reports of  increased knee pain, and pt rates level 2 as "medium difficulty."  Korebalance -  penguin 3x, maze 2x. VC/TC for technique hand/foot placement. BUE to UUE support.  Ankle inversion with YTB - 2x15 B LEs, demonstration, VC/TC for technique. VC provided   Ankle eversion with YTB - 2x15 B LEs, demonstration, VC/TC for technique. VC provided  Seated marches 4# AW 2x20  Seated LAQ 4# AW 1x15, 2x15 5# AW; pt states exercise is "a little bit of a challenge."  Standing heel raises at bar 2x20   Standing dorsiflexion at bar 2x20     PT Education - 10/04/20 1446    Education Details Pt educated on technique with Korebalance exercises, body mechanics    Person(s) Educated Patient    Methods Explanation;Verbal cues;Tactile cues    Comprehension Verbalized understanding;Returned demonstration            PT Short Term Goals - 08/17/20 1331      PT SHORT TERM GOAL #1   Title Patient will be independent in home exercise program to improve strength/mobility for better functional independence with ADLs.    Baseline 08/17/2020 Pt issued HEP    Period Weeks    Status New    Target Date 09/14/20             PT Long Term Goals - 08/17/20 1332      PT LONG TERM GOAL #1   Title Patient will increase FOTO score to equal to or greater than 60 to demonstrate statistically significant improvement in mobility and quality of life.    Baseline 08/17/2020 Pt score 55 (risk-adjusted 50)    Time 8    Period Weeks    Status New    Target Date 10/12/20      PT LONG TERM GOAL #2   Title Pt will increase MMT scores by at least 1/2 point to indicate increased B LE strength for performing functional activities.    Baseline 08/17/2020 MMT L/R hip flexion 3/5, 3/5; hip abduction 3/5, 3+/5; hip adduction 2+/5, 3-/5; knee flexion 4-/5, 3+/5, knee extension 4-/5, 3/5; ankle 3/5 B; ankle plantarflexion 4/5 B    Time 8    Period Weeks    Status New    Target Date 10/12/20      PT LONG TERM GOAL #3   Title  Patient will increase 10 meter walk test to >1.71m/s as to improve gait speed for better community ambulation and to reduce fall risk.    Baseline 08/17/2020 0.65 m/s no AD    Time 8    Period Weeks    Status New    Target Date 10/12/20      PT LONG TERM  GOAL #4   Title Patient (< 60 years old) will complete five times sit to stand test in < 10 seconds indicating an increased LE strength and improved balance.    Baseline 08/17/2020 5xSTS completed in 14.98 sec    Time 8    Period Weeks    Status New    Target Date 10/12/20      PT LONG TERM GOAL #5   Title Patient will increase Berg Balance score by > 6 points to demonstrate decreased fall risk during functional activities.    Baseline 08/17/2020 Berg score 50/56    Time 8    Period Weeks    Target Date 10/12/20                 Plan - 10/04/20 1448    Clinical Impression Statement Pt with no increased pain reported with all therex performed today. Pt does demonstrate difficulty with scaling weightshifts (all directions) on Korebalance exercises without taking a step instead of using ankle ROM. This difficulty increased when pt went from BUE support to UUE support. Pt will benefit from further skilled PT to improve BLE strength and balance in order to imporve ease with all functional mobility.    Personal Factors and Comorbidities Comorbidity 1;Comorbidity 2;Comorbidity 3+;Fitness    Comorbidities pertinent: hx of thrombosis, chronic fatigue, osteoporisis, HA disorder, polyathralgia, COPD, IBS, Fibromyalgia, lupus    Examination-Activity Limitations Stairs;Transfers;Locomotion Level;Sleep;Squat;Stand;Reach Overhead    Examination-Participation Restrictions Community Activity;Meal Prep;Laundry;Shop;Volunteer;Yard Work;Interpersonal Relationship    Stability/Clinical Decision Making Evolving/Moderate complexity    Rehab Potential Good    PT Frequency 2x / week    PT Duration 8 weeks    PT Treatment/Interventions ADLs/Self Care Home  Management;Electrical Stimulation;Moist Heat;DME Instruction;Gait training;Stair training;Cryotherapy;Ultrasound;Functional mobility training;Neuromuscular re-education;Balance training;Therapeutic exercise;Therapeutic activities;Patient/family education;Orthotic Fit/Training;Manual techniques;Passive range of motion;Energy conservation;Taping;Canalith Repostioning;Vestibular;Visual/perceptual remediation/compensation    PT Next Visit Plan Obstacle course with focus on stepping over obstacles and navigating around cones; progress leg press; SLB; advance ankle strengthening; reassess goals    PT Home Exercise Plan see HEP (medbridge link)    Consulted and Agree with Plan of Care Patient           Patient will benefit from skilled therapeutic intervention in order to improve the following deficits and impairments:  Abnormal gait,Decreased activity tolerance,Decreased balance,Decreased endurance,Decreased coordination,Decreased mobility,Decreased strength,Decreased range of motion,Pain,Improper body mechanics,Postural dysfunction,Impaired flexibility,Difficulty walking,Cardiopulmonary status limiting activity,Decreased knowledge of use of DME,Obesity  Visit Diagnosis: Muscle weakness (generalized)  Unsteadiness on feet  Gait abnormality  Pain of right lower extremity     Problem List Patient Active Problem List   Diagnosis Date Noted  . Vitamin D deficiency 08/22/2019  . History of thrombosis 08/22/2019  . Chronic fatigue 07/17/2019  . Osteoporosis 05/12/2019  . STEC (Shiga toxin-producing Escherichia coli) infection 04/06/2019  . Diverticulitis 04/06/2019  . Thrombocytopenia ([place]) 01/23/2019  . Leukopenia 01/23/2019  . Lymphedema of both lower extremities 01/16/2019  . Polyarthralgia 10/14/2018  . Headache disorder 10/14/2018  . Chronic daily headache 10/11/2018  . COPD (chronic obstructive pulmonary disease) ([place]) 10/11/2018  . Thoracic aortic atherosclerosis ([place]) 10/11/2018   . Multiple episodes of deep venous thrombosis ([place]) 09/17/2018  . IBS (irritable bowel syndrome) 09/17/2018  . Healthcare maintenance 09/17/2018  . Primary osteoarthritis of right hip 09/03/2018  . Presence of right hip implant 09/03/2018  . Morbid obesity with BMI of 45.0-49.9, adult ([place]) 08/23/2018  . Shortness of breath 08/22/2018  . History of ductal carcinoma in situ (DCIS) of breast 07/20/2018  .  Aromatase inhibitor use 07/20/2018  . Abnormal findings on auscultation 07/16/2018  . Encounter for long-term (current) use of high-risk medication 07/16/2018  . Fibromyalgia 07/16/2018  . Other forms of systemic lupus erythematosus ([place]) 07/16/2018  . Other osteoporosis without current pathological fracture 07/16/2018  . Seronegative arthritis 07/16/2018   Ricard Dillon PT, DPT 10/04/2020, 2:55 PM  [place] [facility] SERVICES [address] [place], [place], [address] Phone: [phone]   Fax:  [phone]  Name: Victoria [name] MRN: [id number] Date of Birth: 09-22-1960

## 2020-10-06 ENCOUNTER — Other Ambulatory Visit: Payer: Self-pay

## 2020-10-06 ENCOUNTER — Ambulatory Visit: Payer: Medicare Other

## 2020-10-06 ENCOUNTER — Ambulatory Visit: Payer: Medicare Other | Attending: Neurology

## 2020-10-06 DIAGNOSIS — M79604 Pain in right leg: Secondary | ICD-10-CM | POA: Diagnosis present

## 2020-10-06 DIAGNOSIS — R2681 Unsteadiness on feet: Secondary | ICD-10-CM | POA: Diagnosis present

## 2020-10-06 DIAGNOSIS — M6281 Muscle weakness (generalized): Secondary | ICD-10-CM | POA: Insufficient documentation

## 2020-10-06 DIAGNOSIS — R269 Unspecified abnormalities of gait and mobility: Secondary | ICD-10-CM | POA: Diagnosis present

## 2020-10-06 NOTE — Therapy (Signed)
[place] MAIN [facility] SERVICES [address] [place], [place], [address] Phone: [phone]   Fax:  [phone]  Physical Therapy Treatment/ Physical Therapy Progress Note/RECERTIFICATION   Dates of reporting period  08/17/2020   to   10/06/2020   Patient Details  Name: Victoria [name] MRN: [id number] Date of Birth: Apr 17, 1961 Referring Provider (PT): Vladimir Crofts, MD   Encounter Date: 10/06/2020   PT End of Session - 10/06/20 1619    Visit Number 10    Number of Visits 29    Date for PT Re-Evaluation 12/29/20    Authorization Type Prog note and recert performed 09/08/9796    PT Start Time 1512    PT Stop Time 1600    PT Time Calculation (min) 48 min    Equipment Utilized During Treatment Gait belt    Activity Tolerance Patient tolerated treatment well    Behavior During Therapy WFL for tasks assessed/performed           Past Medical History:  Diagnosis Date  . Allergy   . Asthma   . Cancer ([place])    Breast Left, DCIS 2018  . Collagen vascular disease (HCC)    Lupus  . COPD (chronic obstructive pulmonary disease) ([place])   . Diverticulitis   . Fibromyalgia   . GERD (gastroesophageal reflux disease)   . Headache    Migraines  . Hearing impaired person, left    bilateral hearing aids  . IBS (irritable bowel syndrome)   . Lupus ([place])   . Mitral valve disorder   . MS (multiple sclerosis) ([place])   . Neuromuscular disorder ([place])   . Neuropathy   . Osteoarthritis   . Osteoporosis 05/12/2019  . Personal history of radiation therapy 2017   left   . Rheumatoid arthritis (HCC)    hands and legs  . Seizures ([place])    no seizures for 10 years    Past Surgical History:  Procedure Laterality Date  . ABDOMINAL HYSTERECTOMY    . APPENDECTOMY    . BREAST EXCISIONAL BIOPSY Left 2017   lumpectomy with radation  . BREAST EXCISIONAL BIOPSY Left 2004   neg surgical bx   . BREAST LUMPECTOMY Left 2017   DCIS  . BREAST SURGERY    .  CHOLECYSTECTOMY    . COLONOSCOPY WITH PROPOFOL N/A 07/07/2019   Procedure: COLONOSCOPY WITH PROPOFOL;  Surgeon: Toledo, Benay Pike, MD;  Location: [facility] ENDOSCOPY;  Service: Gastroenterology;  Laterality: N/A;  . DIRECT LARYNGOSCOPY N/A 09/22/2020   Procedure: SUSPENSION MICRODIRECT LARYNGOSCOPY WITH BIOPSY;  Surgeon: Carloyn Manner, MD;  Location: [place];  Service: ENT;  Laterality: N/A;  has port a cath needs accessed  . ESOPHAGOGASTRODUODENOSCOPY (EGD) WITH PROPOFOL N/A 07/07/2019   Procedure: ESOPHAGOGASTRODUODENOSCOPY (EGD) WITH PROPOFOL;  Surgeon: Toledo, Benay Pike, MD;  Location: [facility] ENDOSCOPY;  Service: Gastroenterology;  Laterality: N/A;  . PATENT DUCTUS ARTERIOUS REPAIR    . RECONSTRUCTION TENDON PULLEY HAND Left   . TOTAL HIP ARTHROPLASTY Right     There were no vitals filed for this visit.     Subjective Assessment - 10/06/20 1618    Subjective Pt reports 8/10 pain today. Pt has a migraine.    Pertinent History Per chart: PMH includes: Lupus, seronegative arthritis, fibromyalgia, osteoporosis, breast cancer(radiation), morbid obesity, DVTs, IBS,COPD, chronic migraine, lymphedema, thrombocytopenia. Seizures, RA, MS? back pain that radiates down into both hips and legs. She has bilateral foot numbness. Toes will turn purple.    Limitations Sitting;Walking  How long can you sit comfortably? Pt says sitting limited to 20 minutes, worsened with spinal extension and improved with flexion.    How long can you stand comfortably? no reported difficulty    How long can you walk comfortably? Pt reports difficulty with prolonged walking    Diagnostic tests MRI done on 05/10/2020 (see physician note)    Patient Stated Goals Pt would like to increae LE strength and decrease pain in order to walk her dog    Currently in Pain? Yes    Pain Score 8     Pain Location Knee    Pain Orientation Right    Pain Onset More than a month ago          Treatment-  FOTO 48   MMT:  Hip flexion 4/5 B; hip abduction 4+/5 B; hip adduction 3+/5 and pain limited B; knee extension 4+/5 B, L knee flexion 4+/5, R knee flexion 5/5; B ankle plantarflexion and dorsiflexion both 5/5  10MWT 0.71 m/s   5XSTS First trial 17.63 seconds Second trial: 13.81 seconds   BERG 54/56   Korebalance machine, CGA provided throughout Tux race 3x from BUE support to UUE support Weight shifts to ball - 4x, UUE support Maze - 1x, UUE support Pt still with difficulty with utilizing ankle mobility for weight-shifts without taking step/scaling weight shifts to meet targets. She has greatest difficulty weight-shifting posteriorly.   4" toe taps, BLEs - intermittent UE support, CGA SLB 4x30 sec B LEs 3-finger UE assist, CGA assist Tandem stance 2x30 sec B LEs, 3-finger UE assist to intermittent UE assist.    Assessment: Goals reassessed this session. Pt shows gains toward multiple goals: 10MWT (0.71 m/s with no AD), MMT (achieved goal), BERG (54/56) and 5XSTS (13.81 sec). These improvements indicate increased BLE strength and power, decreased fall risk and improved balance, and improved gait speed/ability. Pt did see decrease in her FOTO score (48%), which was possibly impacted by pt having migraine today. PT discussed pt progress with goals and current areas to work on/indications for continued therapy. Due to pt progress, motivation in and outside of therapy, pt frequency can be reduced to 1x/week. HEP to be progressed in future session. Patient's condition has the potential to improve in response to therapy. Maximum improvement is yet to be obtained. The anticipated improvement is attainable and reasonable in a generally predictable time. Pt will benefit from further skilled therapy to continue to improve BLE strength and power, gait speed/ability, and balance to increase safety and QOL.     [facility] PT Assessment - 10/06/20 0001      Berg Balance Test   Sit to Stand Able to stand without using hands  and stabilize independently    Standing Unsupported Able to stand safely 2 minutes    Sitting with Back Unsupported but Feet Supported on Floor or Stool Able to sit safely and securely 2 minutes    Stand to Sit Sits safely with minimal use of hands    Transfers Able to transfer safely, minor use of hands    Standing Unsupported with Eyes Closed Able to stand 10 seconds safely    Standing Unsupported with Feet Together Able to place feet together independently and stand 1 minute safely    From Standing, Reach Forward with Outstretched Arm Can reach confidently >25 cm (10")    From Standing Position, Pick up Object from Floor Able to pick up shoe safely and easily    From Standing Position, Turn to  Look Behind Over each Shoulder Looks behind from both sides and weight shifts well    Turn 360 Degrees Able to turn 360 degrees safely in 4 seconds or less    Standing Unsupported, Alternately Place Feet on Step/Stool Able to stand independently and safely and complete 8 steps in 20 seconds    Standing Unsupported, One Foot in [place] to plae foot ahead of the other independently and hold 30 seconds    Standing on One Leg Able to lift leg independently and hold 5-10 seconds    Total Score 54              PT Short Term Goals - 10/06/20 1513      PT SHORT TERM GOAL #1   Title Patient will be independent in home exercise program to improve strength/mobility for better functional independence with ADLs.    Baseline 08/17/2020 Pt issued HEP; 10/06/2020 Pt reports she is confident with HEP, advanced HEP to be issued future session    Time 6    Period Weeks    Status On-going    Target Date 11/17/20             PT Long Term Goals - 10/06/20 1514      PT LONG TERM GOAL #1   Title Patient will increase FOTO score to equal to or greater than 60 to demonstrate statistically significant improvement in mobility and quality of life.    Baseline 08/17/2020 Pt score 55 (risk-adjusted 50); 10/06/2020 48     Time 12    Period Weeks    Status On-going    Target Date 12/29/20      PT LONG TERM GOAL #2   Title Pt will increase MMT scores by at least 1/2 point to indicate increased B LE strength for performing functional activities.    Baseline 08/17/2020 MMT L/R hip flexion 3/5, 3/5; hip abduction 3/5, 3+/5; hip adduction 2+/5, 3-/5; knee flexion 4-/5, 3+/5, knee extension 4-/5, 3/5; ankle 3/5 B; ankle plantarflexion 4/5 B; 10/06/2020 Hip flexion 4/5 B; hip abduction 4+/5 B; hip adduction 3+/5 and pain limited; knee extension 4+/5 B, L knee flexion 4+/5, R knee flexion 5/5; B ankle plantarflexion and dorsiflexion both 5/5 B    Time 8    Period Weeks    Status Achieved      PT LONG TERM GOAL #3   Title Patient will increase 10 meter walk test to >1.48ms as to improve gait speed for better community ambulation and to reduce fall risk.    Baseline 08/17/2020 0.65 m/s no AD; 0.71 m/s with no AD    Time 12    Period Weeks    Status On-going    Target Date 12/29/20      PT LONG TERM GOAL #4   Title Patient (< 612years old) will complete five times sit to stand test in < 10 seconds indicating an increased LE strength and improved balance.    Baseline 08/17/2020 5xSTS completed in 14.98 sec; 10/06/2020 13.81 sec    Time 12    Period Weeks    Status On-going    Target Date 12/29/20      PT LONG TERM GOAL #5   Title Patient will increase Berg Balance score by > 6 points to demonstrate decreased fall risk during functional activities.    Baseline 08/17/2020 Berg score 50/56; 10/06/2020 54/56    Time 12    Period Weeks    Status Partially Met  Target Date 12/29/20                 Plan - 10/06/20 1626    Clinical Impression Statement Goals reassessed this session. Pt shows gains toward multiple goals: 10MWT (0.71 m/s with no AD), MMT (achieved goal), BERG (54/56) and 5XSTS (13.81 sec). These improvements indicate increased BLE strength and power, decreased fall risk and improved balance, and  improved gait speed/ability. Pt did see decrease in her FOTO score (48%), which was possibly impacted by pt having migraine today. PT discussed pt progress with goals and current areas to work on/indications for continued therapy. Due to pt progress, motivation in and outside of therapy, pt frequency can be reduced to 1x/week. HEP to be progressed in future session. Patient's condition has the potential to improve in response to therapy. Maximum improvement is yet to be obtained. The anticipated improvement is attainable and reasonable in a generally predictable time. Pt will benefit from further skilled therapy to continue to improve BLE strength and power, gait speed/ability, and balance to increase safety and QOL.    Personal Factors and Comorbidities Comorbidity 1;Comorbidity 2;Comorbidity 3+;Fitness    Comorbidities pertinent: hx of thrombosis, chronic fatigue, osteoporisis, HA disorder, polyathralgia, COPD, IBS, Fibromyalgia, lupus    Examination-Activity Limitations Stairs;Transfers;Locomotion Level;Sleep;Squat;Stand;Reach Overhead    Examination-Participation Restrictions Community Activity;Meal Prep;Laundry;Shop;Volunteer;Yard Work;Interpersonal Relationship    Stability/Clinical Decision Making Evolving/Moderate complexity    Rehab Potential Good    PT Frequency 1x / week    PT Duration 12 weeks    PT Treatment/Interventions ADLs/Self Care Home Management;Electrical Stimulation;Moist Heat;DME Instruction;Gait training;Stair training;Cryotherapy;Ultrasound;Functional mobility training;Neuromuscular re-education;Balance training;Therapeutic exercise;Therapeutic activities;Patient/family education;Orthotic Fit/Training;Manual techniques;Passive range of motion;Energy conservation;Taping;Canalith Repostioning;Vestibular;Visual/perceptual remediation/compensation    PT Next Visit Plan advance HEP    PT Home Exercise Plan see HEP (medbridge link)    Consulted and Agree with Plan of Care Patient            Patient will benefit from skilled therapeutic intervention in order to improve the following deficits and impairments:  Abnormal gait,Decreased activity tolerance,Decreased balance,Decreased endurance,Decreased coordination,Decreased mobility,Decreased strength,Decreased range of motion,Pain,Improper body mechanics,Postural dysfunction,Impaired flexibility,Difficulty walking,Cardiopulmonary status limiting activity,Decreased knowledge of use of DME,Obesity  Visit Diagnosis: Unsteadiness on feet  Muscle weakness (generalized)  Gait abnormality  Pain of right lower extremity     Problem List Patient Active Problem List   Diagnosis Date Noted  . Vitamin D deficiency 08/22/2019  . History of thrombosis 08/22/2019  . Chronic fatigue 07/17/2019  . Osteoporosis 05/12/2019  . STEC (Shiga toxin-producing Escherichia coli) infection 04/06/2019  . Diverticulitis 04/06/2019  . Thrombocytopenia ([place]) 01/23/2019  . Leukopenia 01/23/2019  . Lymphedema of both lower extremities 01/16/2019  . Polyarthralgia 10/14/2018  . Headache disorder 10/14/2018  . Chronic daily headache 10/11/2018  . COPD (chronic obstructive pulmonary disease) ([place]) 10/11/2018  . Thoracic aortic atherosclerosis ([place]) 10/11/2018  . Multiple episodes of deep venous thrombosis ([place]) 09/17/2018  . IBS (irritable bowel syndrome) 09/17/2018  . Healthcare maintenance 09/17/2018  . Primary osteoarthritis of right hip 09/03/2018  . Presence of right hip implant 09/03/2018  . Morbid obesity with BMI of 45.0-49.9, adult ([place]) 08/23/2018  . Shortness of breath 08/22/2018  . History of ductal carcinoma in situ (DCIS) of breast 07/20/2018  . Aromatase inhibitor use 07/20/2018  . Abnormal findings on auscultation 07/16/2018  . Encounter for long-term (current) use of high-risk medication 07/16/2018  . Fibromyalgia 07/16/2018  . Other forms of systemic lupus erythematosus ([place]) 07/16/2018  . Other osteoporosis without  current pathological fracture 07/16/2018  . Seronegative arthritis 07/16/2018   Ricard Dillon PT, DPT  10/06/2020, 4:43 PM  [place] MAIN [facility] SERVICES [address] [place], [place], [address] Phone: [phone]   Fax:  [phone]  Name: Victoria [name] MRN: [id number] Date of Birth: [birth year]

## 2020-10-08 ENCOUNTER — Other Ambulatory Visit: Payer: Self-pay | Admitting: Physician Assistant

## 2020-10-08 ENCOUNTER — Other Ambulatory Visit: Payer: Self-pay

## 2020-10-08 ENCOUNTER — Ambulatory Visit
Admission: RE | Admit: 2020-10-08 | Discharge: 2020-10-08 | Disposition: A | Discharge status: Home / Self Care | Payer: Medicare Other | Source: Ambulatory Visit | Attending: Physician Assistant | Admitting: Physician Assistant

## 2020-10-08 DIAGNOSIS — R1032 Left lower quadrant pain: Secondary | ICD-10-CM

## 2020-10-08 MED ORDER — HEPARIN SOD (PORK) LOCK FLUSH 100 UNIT/ML IV SOLN
500.0000 [IU] | INTRAVENOUS | Status: AC | PRN
  Administered 2020-10-08: 500 [IU]
  Filled 2020-10-08: qty 5

## 2020-10-08 MED ORDER — IOHEXOL 300 MG/ML  SOLN
100.0000 mL | Freq: Once | INTRAMUSCULAR | Status: AC | PRN
  Administered 2020-10-08: 100 mL via INTRAVENOUS

## 2020-10-11 ENCOUNTER — Other Ambulatory Visit: Payer: Self-pay

## 2020-10-11 ENCOUNTER — Emergency Department
Admission: EM | Admit: 2020-10-11 | Discharge: 2020-10-11 | Disposition: A | Discharge status: Home / Self Care | Payer: Medicare Other | Attending: Student in an Organized Health Care Education/Training Program | Admitting: Student in an Organized Health Care Education/Training Program

## 2020-10-11 ENCOUNTER — Emergency Department: Payer: Medicare Other

## 2020-10-11 DIAGNOSIS — Z96641 Presence of right artificial hip joint: Secondary | ICD-10-CM | POA: Insufficient documentation

## 2020-10-11 DIAGNOSIS — J45909 Unspecified asthma, uncomplicated: Secondary | ICD-10-CM | POA: Insufficient documentation

## 2020-10-11 DIAGNOSIS — R1032 Left lower quadrant pain: Secondary | ICD-10-CM | POA: Diagnosis present

## 2020-10-11 DIAGNOSIS — Z87891 Personal history of nicotine dependence: Secondary | ICD-10-CM | POA: Insufficient documentation

## 2020-10-11 DIAGNOSIS — Z7952 Long term (current) use of systemic steroids: Secondary | ICD-10-CM | POA: Diagnosis not present

## 2020-10-11 DIAGNOSIS — R197 Diarrhea, unspecified: Secondary | ICD-10-CM | POA: Diagnosis not present

## 2020-10-11 DIAGNOSIS — Z853 Personal history of malignant neoplasm of breast: Secondary | ICD-10-CM | POA: Insufficient documentation

## 2020-10-11 DIAGNOSIS — J449 Chronic obstructive pulmonary disease, unspecified: Secondary | ICD-10-CM | POA: Insufficient documentation

## 2020-10-11 DIAGNOSIS — R110 Nausea: Secondary | ICD-10-CM | POA: Diagnosis not present

## 2020-10-11 DIAGNOSIS — Z8719 Personal history of other diseases of the digestive system: Secondary | ICD-10-CM | POA: Insufficient documentation

## 2020-10-11 DIAGNOSIS — R1084 Generalized abdominal pain: Secondary | ICD-10-CM

## 2020-10-11 DIAGNOSIS — R531 Weakness: Secondary | ICD-10-CM | POA: Insufficient documentation

## 2020-10-11 DIAGNOSIS — Z9089 Acquired absence of other organs: Secondary | ICD-10-CM | POA: Diagnosis not present

## 2020-10-11 LAB — COMPREHENSIVE METABOLIC PANEL
ALT: 26 U/L (ref 0–44)
AST: 27 U/L (ref 15–41)
Albumin: 4.3 g/dL (ref 3.5–5.0)
Alkaline Phosphatase: 65 U/L (ref 38–126)
Anion gap: 9 (ref 5–15)
BUN: 9 mg/dL (ref 6–20)
CO2: 21 mmol/L — ABNORMAL LOW (ref 22–32)
Calcium: 9.5 mg/dL (ref 8.9–10.3)
Chloride: 111 mmol/L (ref 98–111)
Creatinine, Ser: 0.69 mg/dL (ref 0.44–1.00)
GFR, Estimated: >60 mL/min (ref >60)
Glucose, Bld: 89 mg/dL (ref 70–99)
Potassium: 3.1 mmol/L — ABNORMAL LOW (ref 3.5–5.1)
Sodium: 141 mmol/L (ref 135–145)
Total Bilirubin: 0.6 mg/dL (ref 0.3–1.2)
Total Protein: 7.2 g/dL (ref 6.5–8.1)

## 2020-10-11 LAB — CBC
HCT: 43.8 % (ref 36.0–46.0)
Hemoglobin: 14.4 g/dL (ref 12.0–15.0)
MCH: 29.8 pg (ref 26.0–34.0)
MCHC: 32.9 g/dL (ref 30.0–36.0)
MCV: 90.7 fL (ref 80.0–100.0)
Platelets: 161 10*3/uL (ref 150–400)
RBC: 4.83 MIL/uL (ref 3.87–5.11)
RDW: 14.3 % (ref 11.5–15.5)
WBC: 4.1 10*3/uL (ref 4.0–10.5)
nRBC: 0 % (ref 0.0–0.2)

## 2020-10-11 LAB — URINALYSIS, COMPLETE (UACMP) WITH MICROSCOPIC
Bacteria, UA: NONE SEEN
Bilirubin Urine: NEGATIVE
Glucose, UA: NEGATIVE mg/dL
Hgb urine dipstick: NEGATIVE
Ketones, ur: NEGATIVE mg/dL
Leukocytes,Ua: NEGATIVE
Nitrite: NEGATIVE
Protein, ur: NEGATIVE mg/dL
Specific Gravity, Urine: 1.023 (ref 1.005–1.030)
pH: 6 (ref 5.0–8.0)

## 2020-10-11 LAB — LACTIC ACID, PLASMA: Lactic Acid, Venous: 0.6 mmol/L (ref 0.5–1.9)

## 2020-10-11 LAB — LIPASE, BLOOD: Lipase: 46 U/L (ref 11–51)

## 2020-10-11 MED ORDER — ONDANSETRON HCL 4 MG/2ML IJ SOLN
4.0000 mg | Freq: Once | INTRAMUSCULAR | Status: AC
  Administered 2020-10-11: 4 mg via INTRAVENOUS

## 2020-10-11 MED ORDER — ONDANSETRON HCL 4 MG/2ML IJ SOLN
4.0000 mg | Freq: Once | INTRAMUSCULAR | Status: AC
  Administered 2020-10-11: 4 mg via INTRAVENOUS
  Filled 2020-10-11: qty 2

## 2020-10-11 MED ORDER — SODIUM CHLORIDE 0.9 % IV BOLUS
1000.0000 mL | Freq: Once | INTRAVENOUS | Status: AC
  Administered 2020-10-11: 1000 mL via INTRAVENOUS

## 2020-10-11 MED ORDER — IOHEXOL 300 MG/ML  SOLN
100.0000 mL | Freq: Once | INTRAMUSCULAR | Status: AC | PRN
  Administered 2020-10-11: 100 mL via INTRAVENOUS
  Filled 2020-10-11: qty 100

## 2020-10-11 MED ORDER — MORPHINE SULFATE (PF) 4 MG/ML IV SOLN
4.0000 mg | INTRAVENOUS | Status: DC | PRN
Start: 2020-10-11 — End: 2020-10-12
  Administered 2020-10-11: 4 mg via INTRAVENOUS
  Filled 2020-10-11: qty 1

## 2020-10-11 MED ORDER — ONDANSETRON HCL 4 MG/2ML IJ SOLN
INTRAMUSCULAR | Status: AC
  Filled 2020-10-11: qty 2

## 2020-10-11 NOTE — ED Notes (Signed)
Pt meal tray and diet coke per request.  PO challenge per Dr. Quentin Cornwall.

## 2020-10-11 NOTE — Discharge Instructions (Addendum)

## 2020-10-11 NOTE — ED Provider Notes (Signed)
[facility] Emergency Department Provider Note    Event Date/Time   First MD Initiated Contact with Patient 10/11/20 1314     (approximate)  I have reviewed the triage vital signs and the nursing notes.   HISTORY  Chief Complaint Abdominal Pain    HPI Victoria [name] is a 60 y.o. female below listed past medical history on Plaquenil recently started on Augmentin as well as prednisone for colitis did not have stool studies at that time.  Does have a history of C. difficile.  Feels that her symptoms have gotten worse and with more pain in the left lower quadrant.  Has been admitted for C. difficile in the past.  States that she has been feeling weak and dehydrated having decreased p.o. intake secondary to nausea no vomiting but multiple episodes of watery nonbloody diarrhea.    Past Medical History:  Diagnosis Date  . Allergy   . Asthma   . Cancer ([place])    Breast Left, DCIS 2018  . Collagen vascular disease (HCC)    Lupus  . COPD (chronic obstructive pulmonary disease) ([place])   . Diverticulitis   . Fibromyalgia   . GERD (gastroesophageal reflux disease)   . Headache    Migraines  . Hearing impaired person, left    bilateral hearing aids  . IBS (irritable bowel syndrome)   . Lupus ([place])   . Mitral valve disorder   . MS (multiple sclerosis) ([place])   . Neuromuscular disorder ([place])   . Neuropathy   . Osteoarthritis   . Osteoporosis 05/12/2019  . Personal history of radiation therapy 2017   left   . Rheumatoid arthritis (HCC)    hands and legs  . Seizures ([place])    no seizures for 10 years   Family History  Problem Relation Age of Onset  . COPD Mother   . Stroke Father   . Arthritis Brother   . Diabetes Brother   . Stroke Brother   . Breast cancer Neg Hx    Past Surgical History:  Procedure Laterality Date  . ABDOMINAL HYSTERECTOMY    . APPENDECTOMY    . BREAST EXCISIONAL BIOPSY Left 2017   lumpectomy with radation  . BREAST EXCISIONAL  BIOPSY Left 2004   neg surgical bx   . BREAST LUMPECTOMY Left 2017   DCIS  . BREAST SURGERY    . CHOLECYSTECTOMY    . COLONOSCOPY WITH PROPOFOL N/A 07/07/2019   Procedure: COLONOSCOPY WITH PROPOFOL;  Surgeon: Toledo, Benay Pike, MD;  Location: [facility] ENDOSCOPY;  Service: Gastroenterology;  Laterality: N/A;  . DIRECT LARYNGOSCOPY N/A 09/22/2020   Procedure: SUSPENSION MICRODIRECT LARYNGOSCOPY WITH BIOPSY;  Surgeon: Carloyn Manner, MD;  Location: [facility];  Service: ENT;  Laterality: N/A;  has port a cath needs accessed  . ESOPHAGOGASTRODUODENOSCOPY (EGD) WITH PROPOFOL N/A 07/07/2019   Procedure: ESOPHAGOGASTRODUODENOSCOPY (EGD) WITH PROPOFOL;  Surgeon: Toledo, Benay Pike, MD;  Location: [facility] ENDOSCOPY;  Service: Gastroenterology;  Laterality: N/A;  . PATENT DUCTUS ARTERIOUS REPAIR    . RECONSTRUCTION TENDON PULLEY HAND Left   . TOTAL HIP ARTHROPLASTY Right    Patient Active Problem List   Diagnosis Date Noted  . Vitamin D deficiency 08/22/2019  . History of thrombosis 08/22/2019  . Chronic fatigue 07/17/2019  . Osteoporosis 05/12/2019  . STEC (Shiga toxin-producing Escherichia coli) infection 04/06/2019  . Diverticulitis 04/06/2019  . Thrombocytopenia ([place]) 01/23/2019  . Leukopenia 01/23/2019  . Lymphedema of both lower extremities 01/16/2019  . Polyarthralgia 10/14/2018  .  Headache disorder 10/14/2018  . Chronic daily headache 10/11/2018  . COPD (chronic obstructive pulmonary disease) ([place]) 10/11/2018  . Thoracic aortic atherosclerosis ([place]) 10/11/2018  . Multiple episodes of deep venous thrombosis ([place]) 09/17/2018  . IBS (irritable bowel syndrome) 09/17/2018  . Healthcare maintenance 09/17/2018  . Primary osteoarthritis of right hip 09/03/2018  . Presence of right hip implant 09/03/2018  . Morbid obesity with BMI of 45.0-49.9, adult ([place]) 08/23/2018  . Shortness of breath 08/22/2018  . History of ductal carcinoma in situ (DCIS) of breast 07/20/2018  . Aromatase  inhibitor use 07/20/2018  . Abnormal findings on auscultation 07/16/2018  . Encounter for long-term (current) use of high-risk medication 07/16/2018  . Fibromyalgia 07/16/2018  . Other forms of systemic lupus erythematosus ([place]) 07/16/2018  . Other osteoporosis without current pathological fracture 07/16/2018  . 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 Authorizing Provider  albuterol (PROVENTIL HFA;VENTOLIN HFA) 108 (90 Base) MCG/ACT inhaler Inhale 2 puffs into the lungs every 6 (six) hours as needed for wheezing or shortness of breath. 06/17/18   Elby Beck, FNP  Ascorbic Acid (VITAMIN C) 1000 MG tablet Take 1,000 mg by mouth daily.    [provider]  atorvastatin (LIPITOR) 40 MG tablet Take 40 mg by mouth daily.    [provider]  azelastine (ASTELIN) 0.1 % nasal spray Place 2 sprays into both nostrils 2 (two) times daily. 03/03/20   [provider]  benzonatate (TESSALON) 100 MG capsule Take 1 capsule (100 mg total) by mouth 3 (three) times daily as needed. Patient not taking: No sig reported 06/25/18   Saran Laviolette, [place] N, PA-C  budesonide-formoterol [facility]) 160-4.5 MCG/ACT inhaler Inhale 2 puffs into the lungs 2 (two) times daily. 05/06/18   Paulette Blanch, MD  cholecalciferol (VITAMIN D3) 25 MCG (1000 UNIT) tablet Take 1,000 Units by mouth daily.    [provider]  cyclobenzaprine (FLEXERIL) 10 MG tablet TAKE 1 TABLET BY MOUTH TWICE A DAY AS NEEDED FOR MUSCLE SPASMS 09/25/18   Elby Beck, FNP  dabigatran (PRADAXA) 150 MG CAPS capsule Take 1 capsule (150 mg total) by mouth 2 (two) times daily. 06/17/18   Elby Beck, FNP  fluticasone (FLONASE) 50 MCG/ACT nasal spray Place into both nostrils. 04/03/20   [provider]  folic acid (FOLVITE) 1 MG tablet Take by mouth. 07/13/20 07/13/21  [provider]  gabapentin (NEURONTIN) 100 MG capsule  200 mg 3 (three) times daily.  12/15/19   [provider]  HYDROcodone-acetaminophen (NORCO) 7.5-325 MG tablet Take 1 tablet by mouth every 8 (eight) hours as needed for severe pain. Must last 30 days. 09/13/20 10/13/20  Gillis Santa, MD  HYDROcodone-acetaminophen (NORCO) 7.5-325 MG tablet Take 1 tablet by mouth every 8 (eight) hours as needed for severe pain. Must last 30 days. 10/10/20 11/09/20  Gillis Santa, MD  hydroxychloroquine (PLAQUENIL) 200 MG tablet Take 200 mg by mouth daily.    [provider]  Ipratropium-Albuterol (COMBIVENT RESPIMAT) 20-100 MCG/ACT AERS respimat INHALE 2 PUFFS BY MOUTH INTO THE LUNGS EVERY 4 TIMES DAILY AS NEEDED FOR WHEEZING 07/20/20   [provider]  letrozole (FEMARA) 2.5 MG tablet TAKE 1 TABLET BY MOUTH ONCE DAILY 10/04/20   Earlie Server, MD  methotrexate 2.5 MG tablet Take 2.5 mg by mouth once a week. 07/21/19   [provider]  omeprazole (PRILOSEC) 40 MG capsule Take 1 capsule (40 mg  total) by mouth 2 (two) times daily. 07/12/18   Elby Beck, FNP  ondansetron (ZOFRAN) 4 MG tablet Take 1 tablet (4 mg total) by mouth every 8 (eight) hours as needed for up to 10 doses for nausea or vomiting. 09/22/20   Vaught, Jeannie Fend, MD  ondansetron (ZOFRAN-ODT) 8 MG disintegrating tablet Take by mouth every 8 (eight) hours as needed.  04/03/19   [provider]  Probiotic Product (PROBIOTIC-10) CHEW Chew by mouth daily.    [provider]  Tiotropium Bromide Monohydrate (SPIRIVA RESPIMAT) 2.5 MCG/ACT AERS Inhale 2 puffs into the lungs every morning. 06/17/18   Elby Beck, FNP  topiramate (TOPAMAX) 50 MG tablet Take 50 mg by mouth 2 (two) times daily. 09/16/19   [provider]  traZODone (DESYREL) 50 MG tablet Take 3 tablets (150 mg total) by mouth at bedtime. 06/17/18   Elby Beck, FNP    Allergies Other, Sodium hypochlorite, Tape, Codeine, Flagyl [metronidazole], Levofloxacin, and Sulfa  antibiotics    Social History Social History   Tobacco Use  . Smoking status: Former Smoker    Packs/day: 2.00    Years: 15.00    Pack years: 30.00    Types: Cigarettes    Quit date: 06/27/1999    Years since quitting: 21.3  . Smokeless tobacco: Never Used  Vaping Use  . Vaping Use: Never used  Substance Use Topics  . Alcohol use: Yes    Comment: Rarely - approx 6 beers once a month  . Drug use: Never    Review of Systems Patient denies headaches, rhinorrhea, blurry vision, numbness, shortness of breath, chest pain, edema, cough, abdominal pain, nausea, vomiting, diarrhea, dysuria, fevers, rashes or hallucinations unless otherwise stated above in HPI. ____________________________________________   PHYSICAL EXAM:  VITAL SIGNS: Vitals:   10/11/20 1411 10/11/20 1809  BP: (!) 145/74 136/71  Pulse: 75 73  Resp: 16 16  Temp:    SpO2: 97% 100%    Constitutional: Alert and oriented.  Eyes: Conjunctivae are normal.  Head: Atraumatic. Nose: No congestion/rhinnorhea. Mouth/Throat: Mucous membranes are moist.   Neck: No stridor. Painless ROM.  Cardiovascular: Normal rate, regular rhythm. Grossly normal heart sounds.  Good peripheral circulation. Respiratory: Normal respiratory effort.  No retractions. Lungs CTAB. Gastrointestinal: Soft with ttp of llq,  No rebound or guarding. No distention. No abdominal bruits. No CVA tenderness. Genitourinary:  Musculoskeletal: No lower extremity tenderness nor edema.  No joint effusions. Neurologic:  Normal speech and language. No gross focal neurologic deficits are appreciated. No facial droop Skin:  Skin is warm, dry and intact. No rash noted. Psychiatric: Mood and affect are normal. Speech and behavior are normal.  ____________________________________________   LABS (all labs ordered are listed, but only abnormal results are displayed)  Results for orders placed or performed during the hospital encounter of 10/11/20 (from the  past 24 hour(s))  Lipase, blood     Status: None   Collection Time: 10/11/20 11:05 AM  Result Value Ref Range   Lipase 46 11 - 51 U/L  Comprehensive metabolic panel     Status: Abnormal   Collection Time: 10/11/20 11:05 AM  Result Value Ref Range   Sodium 141 135 - 145 mmol/L   Potassium 3.1 (L) 3.5 - 5.1 mmol/L   Chloride 111 98 - 111 mmol/L   CO2 21 (L) 22 - 32 mmol/L   Glucose, Bld 89 70 - 99 mg/dL   BUN 9 6 - 20 mg/dL   Creatinine, Ser 0.69  0.44 - 1.00 mg/dL   Calcium 9.5 8.9 - 10.3 mg/dL   Total Protein 7.2 6.5 - 8.1 g/dL   Albumin 4.3 3.5 - 5.0 g/dL   AST 27 15 - 41 U/L   ALT 26 0 - 44 U/L   Alkaline Phosphatase 65 38 - 126 U/L   Total Bilirubin 0.6 0.3 - 1.2 mg/dL   GFR, Estimated >60 >60 mL/min   Anion gap 9 5 - 15  CBC     Status: None   Collection Time: 10/11/20 11:05 AM  Result Value Ref Range   WBC 4.1 4.0 - 10.5 K/uL   RBC 4.83 3.87 - 5.11 MIL/uL   Hemoglobin 14.4 12.0 - 15.0 g/dL   HCT 43.8 36.0 - 46.0 %   MCV 90.7 80.0 - 100.0 fL   MCH 29.8 26.0 - 34.0 pg   MCHC 32.9 30.0 - 36.0 g/dL   RDW 14.3 11.5 - 15.5 %   Platelets 161 150 - 400 K/uL   nRBC 0.0 0.0 - 0.2 %  Lactic acid, plasma     Status: None   Collection Time: 10/11/20  2:00 PM  Result Value Ref Range   Lactic Acid, Venous 0.6 0.5 - 1.9 mmol/L  Urinalysis, Complete w Microscopic Urine, Clean Catch     Status: Abnormal   Collection Time: 10/11/20  3:11 PM  Result Value Ref Range   Color, Urine YELLOW (A) YELLOW   APPearance CLEAR (A) CLEAR   Specific Gravity, Urine 1.023 1.005 - 1.030   pH 6.0 5.0 - 8.0   Glucose, UA NEGATIVE NEGATIVE mg/dL   Hgb urine dipstick NEGATIVE NEGATIVE   Bilirubin Urine NEGATIVE NEGATIVE   Ketones, ur NEGATIVE NEGATIVE mg/dL   Protein, ur NEGATIVE NEGATIVE mg/dL   Nitrite NEGATIVE NEGATIVE   Leukocytes,Ua NEGATIVE NEGATIVE   WBC, UA 0-5 0 - 5 WBC/hpf   Bacteria, UA NONE SEEN NONE SEEN   Squamous Epithelial / LPF 0-5 0 - 5   Mucus PRESENT     ____________________________________________  EKG My review and personal interpretation at Time: 16:38   Indication: nausea  Rate: 70  Rhythm: sinus Axis: normal Other: normal intervals, no stemi ____________________________________________  RADIOLOGY  I personally reviewed all radiographic images ordered to evaluate for the above acute complaints and reviewed radiology reports and findings.  These findings were personally discussed with the patient.  Please see medical record for radiology report.  ____________________________________________   PROCEDURES  Procedure(s) performed:  Procedures    Critical Care performed: no ____________________________________________   INITIAL IMPRESSION / ASSESSMENT AND PLAN / ED COURSE  Pertinent labs & imaging results that were available during my care of the patient were reviewed by me and considered in my medical decision making (see chart for details).   DDX: Colitis, diverticulitis, C. difficile, viral enteritis, mass, dehydration, electrolyte abnormality, medication effect  Baili Stang is a 60 y.o. who presents to the ED with symptoms as described above.  Patient nontoxic-appearing afebrile hemodynamically stable.  Blood work reassuring but patient with complicated presentation given her history of lupus on Plaquenil also with history of C. difficile currently on Augmentin as well as prednisone.  Will give fluids as well as IV narcotic medication.  Will repeat CT imaging due to worsening pain.   Clinical Course as of 10/11/20 1846  Mon Oct 11, 2020  1502 CT abdomen on my review does not show any evidence of toxic megacolon or obstruction.  Suspect this is more likely diverticulitis.  Will await  formal read. [PR]  2010 CT imaging is improved from previous.  Still feels like she is having some cramping pain but does not provide any stool sample which is in many respects reassuring.  Will observe a little bit longer p.o. challenge and  if able to provide stool then we can evaluate that but otherwise if her diarrheal illness is resolving I think should be appropriate for outpatient follow-up. [PR]  1845 Patient tolerated p.o. without any vomiting.  Still no diarrhea.  At this point given her reassuring work-up with stable vital signs reassuring imaging normal blood work I think she is appropriate for referral to outpatient work-up.  She is states that she has medication for symptomatic management at home.  We discussed signs and symptoms for which she should return to the ER. [PR]    Clinical Course User Index [PR] Merlyn Lot, MD    The patient was evaluated in Emergency Department today for the symptoms described in the history of present illness. He/she was evaluated in the context of the global COVID-19 pandemic, which necessitated consideration that the patient might be at risk for infection with the SARS-CoV-2 virus that causes COVID-19. Institutional protocols and algorithms that pertain to the evaluation of patients at risk for COVID-19 are in a state of rapid change based on information released by regulatory bodies including the CDC and federal and state organizations. These policies and algorithms were followed during the patient's care in the ED.  As part of my medical decision making, I reviewed the following data within the [place] notes reviewed and incorporated, Labs reviewed, notes from prior ED visits and [place] Controlled Substance Database   ____________________________________________   FINAL CLINICAL IMPRESSION(S) / ED DIAGNOSES  Final diagnoses:  Generalized abdominal pain  Nausea      NEW MEDICATIONS STARTED DURING THIS VISIT:  New Prescriptions   No medications on file     Note:  This document was prepared using Dragon voice recognition software and may include unintentional dictation errors.    Merlyn Lot, MD 10/11/20 [phone]

## 2020-10-11 NOTE — ED Triage Notes (Signed)
Pt sent from PCP, states she has been having a flare up with colitis since Friday, states she is having LLQ pain with watery diarrhea, chills .. pt is in NAD at present.

## 2020-10-11 NOTE — ED Notes (Signed)
Pt attempting to BM at this time.

## 2020-10-12 ENCOUNTER — Ambulatory Visit: Payer: Medicare Other | Admitting: Physical Therapy

## 2020-10-19 ENCOUNTER — Encounter: Payer: Self-pay | Admitting: Physical Therapy

## 2020-10-19 ENCOUNTER — Other Ambulatory Visit: Payer: Self-pay

## 2020-10-19 ENCOUNTER — Ambulatory Visit: Payer: Medicare Other

## 2020-10-19 DIAGNOSIS — M6281 Muscle weakness (generalized): Secondary | ICD-10-CM

## 2020-10-19 DIAGNOSIS — R2681 Unsteadiness on feet: Secondary | ICD-10-CM

## 2020-10-19 DIAGNOSIS — R269 Unspecified abnormalities of gait and mobility: Secondary | ICD-10-CM

## 2020-10-19 NOTE — Therapy (Signed)
[place] MAIN [facility] SERVICES [address] [place], [place], [address] Phone: [phone]   Fax:  [phone]  Physical Therapy Treatment  Patient Details  Name: Victoria [name] MRN: [id number] Date of Birth: [birth year] Referring Provider (PT): Vladimir Crofts, MD   Encounter Date: 10/19/2020   PT End of Session - 10/19/20 1504    Visit Number 11    Number of Visits 29    Date for PT Re-Evaluation 12/29/20    Authorization Type Prog note and recert performed 0/10/4915    PT Start Time 1441    PT Stop Time 1515    PT Time Calculation (min) 34 min    Equipment Utilized During Treatment Gait belt    Activity Tolerance Patient tolerated treatment well    Behavior During Therapy [facility] for tasks assessed/performed           Past Medical History:  Diagnosis Date  . Allergy   . Asthma   . Cancer ([place])    Breast Left, DCIS 2018  . Collagen vascular disease (HCC)    Lupus  . COPD (chronic obstructive pulmonary disease) ([place])   . Diverticulitis   . Fibromyalgia   . GERD (gastroesophageal reflux disease)   . Headache    Migraines  . Hearing impaired person, left    bilateral hearing aids  . IBS (irritable bowel syndrome)   . Lupus ([place])   . Mitral valve disorder   . MS (multiple sclerosis) ([place])   . Neuromuscular disorder ([place])   . Neuropathy   . Osteoarthritis   . Osteoporosis 05/12/2019  . Personal history of radiation therapy 2017   left   . Rheumatoid arthritis (HCC)    hands and legs  . Seizures ([place])    no seizures for 10 years    Past Surgical History:  Procedure Laterality Date  . ABDOMINAL HYSTERECTOMY    . APPENDECTOMY    . BREAST EXCISIONAL BIOPSY Left 2017   lumpectomy with radation  . BREAST EXCISIONAL BIOPSY Left 2004   neg surgical bx   . BREAST LUMPECTOMY Left 2017   DCIS  . BREAST SURGERY    . CHOLECYSTECTOMY    . COLONOSCOPY WITH PROPOFOL N/A 07/07/2019   Procedure: COLONOSCOPY WITH PROPOFOL;  Surgeon:  Toledo, Benay Pike, MD;  Location: [facility] ENDOSCOPY;  Service: Gastroenterology;  Laterality: N/A;  . DIRECT LARYNGOSCOPY N/A 09/22/2020   Procedure: SUSPENSION MICRODIRECT LARYNGOSCOPY WITH BIOPSY;  Surgeon: Carloyn Manner, MD;  Location: [place];  Service: ENT;  Laterality: N/A;  has port a cath needs accessed  . ESOPHAGOGASTRODUODENOSCOPY (EGD) WITH PROPOFOL N/A 07/07/2019   Procedure: ESOPHAGOGASTRODUODENOSCOPY (EGD) WITH PROPOFOL;  Surgeon: Toledo, Benay Pike, MD;  Location: [facility] ENDOSCOPY;  Service: Gastroenterology;  Laterality: N/A;  . PATENT DUCTUS ARTERIOUS REPAIR    . RECONSTRUCTION TENDON PULLEY HAND Left   . TOTAL HIP ARTHROPLASTY Right     There were no vitals filed for this visit.   Subjective Assessment - 10/19/20 1448    Subjective Pt reports seeing specialist about her Lupus. Pt wishes to continue PT 1x/week to maintain strength and current level of function. No fall reported.    Pertinent History Per chart: PMH includes: Lupus, seronegative arthritis, fibromyalgia, osteoporosis, breast cancer(radiation), morbid obesity, DVTs, IBS,COPD, chronic migraine, lymphedema, thrombocytopenia. Seizures, RA, MS? back pain that radiates down into both hips and legs. She has bilateral foot numbness. Toes will turn purple.    Limitations Sitting;Walking    How long can  you sit comfortably? Pt says sitting limited to 20 minutes, worsened with spinal extension and improved with flexion.    How long can you stand comfortably? no reported difficulty    How long can you walk comfortably? Pt reports difficulty with prolonged walking    Diagnostic tests MRI done on 05/10/2020 (see physician note)    Patient Stated Goals Pt would like to increae LE strength and decrease pain in order to walk her dog    Currently in Pain? Yes    Pain Score 6     Pain Location Knee    Pain Orientation Right    Pain Descriptors / Indicators Aching;Dull    Pain Type Chronic pain    Pain Onset More than a  month ago           There.ex:   Nu-Step L2 for 5 min to improve cardiopulmonary endurance. LE's only. SPM > 70.   Side steps with RTB at ankles in // bars for hip strength: x6 lengths. BUE support. SBA   Standing marches with 3# AW's BUE support for hip flexor strength: x12/LE   Seated LAQ's: x8/LE 3# AW's. 1x15 5# AW's/LE.   Seated alternating cone taps with 5# AW's laterally to target multiple hip muscle recruitment. 2x10/LE   Side steps on airex foam for hip/ankle stabilization/strength. BUE support. CGA   Pt required multimodal cuing for form/technique of exercise to target correct musculature for strength training. Excellent carryover after cues. Intermittent SBA/CGA for safety in // bars.   PT Education - 10/19/20 1450    Education Details Form/technique with exercise. Continuing PT to help slow down progression of lupus    Person(s) Educated Patient    Methods Explanation;Demonstration    Comprehension Verbalized understanding;Returned demonstration            PT Short Term Goals - 10/06/20 1513      PT SHORT TERM GOAL #1   Title Patient will be independent in home exercise program to improve strength/mobility for better functional independence with ADLs.    Baseline 08/17/2020 Pt issued HEP; 10/06/2020 Pt reports she is confident with HEP, advanced HEP to be issued future session    Time 6    Period Weeks    Status On-going    Target Date 11/17/20             PT Long Term Goals - 10/06/20 1514      PT LONG TERM GOAL #1   Title Patient will increase FOTO score to equal to or greater than 60 to demonstrate statistically significant improvement in mobility and quality of life.    Baseline 08/17/2020 Pt score 55 (risk-adjusted 50); 10/06/2020 48    Time 12    Period Weeks    Status On-going    Target Date 12/29/20      PT LONG TERM GOAL #2   Title Pt will increase MMT scores by at least 1/2 point to indicate increased B LE strength for performing functional  activities.    Baseline 08/17/2020 MMT L/R hip flexion 3/5, 3/5; hip abduction 3/5, 3+/5; hip adduction 2+/5, 3-/5; knee flexion 4-/5, 3+/5, knee extension 4-/5, 3/5; ankle 3/5 B; ankle plantarflexion 4/5 B; 10/06/2020 Hip flexion 4/5 B; hip abduction 4+/5 B; hip adduction 3+/5 and pain limited; knee extension 4+/5 B, L knee flexion 4+/5, R knee flexion 5/5; B ankle plantarflexion and dorsiflexion both 5/5 B    Time 8    Period Weeks    Status Achieved  PT LONG TERM GOAL #3   Title Patient will increase 10 meter walk test to >1.14ms as to improve gait speed for better community ambulation and to reduce fall risk.    Baseline 08/17/2020 0.65 m/s no AD; 0.71 m/s with no AD    Time 12    Period Weeks    Status On-going    Target Date 12/29/20      PT LONG TERM GOAL #4   Title Patient (< 672years old) will complete five times sit to stand test in < 10 seconds indicating an increased LE strength and improved balance.    Baseline 08/17/2020 5xSTS completed in 14.98 sec; 10/06/2020 13.81 sec    Time 12    Period Weeks    Status On-going    Target Date 12/29/20      PT LONG TERM GOAL #5   Title Patient will increase Berg Balance score by > 6 points to demonstrate decreased fall risk during functional activities.    Baseline 08/17/2020 Berg score 50/56; 10/06/2020 54/56    Time 12    Period Weeks    Status Partially Met    Target Date 12/29/20                 Plan - 10/19/20 1524    Clinical Impression Statement Pt continued pain from lupus but remains positive in therapy. Pt tolerated bouts of resisted seated and standing exercises with no reports of increased pain. Pt and PT discussed maintaining current POC to 1x/week to maintain gains seen with PT and importance on continued mobility to help slow down prgressiveness of lupus. Pt can continue to benefit from skilled PT services to improe capacity for functional mobility to complete ADL's and improve QOL.    Personal Factors and  Comorbidities Comorbidity 1;Comorbidity 2;Comorbidity 3+;Fitness    Comorbidities pertinent: hx of thrombosis, chronic fatigue, osteoporisis, HA disorder, polyathralgia, COPD, IBS, Fibromyalgia, lupus    Examination-Activity Limitations Stairs;Transfers;Locomotion Level;Sleep;Squat;Stand;Reach Overhead    Examination-Participation Restrictions Community Activity;Meal Prep;Laundry;Shop;Volunteer;Yard Work;Interpersonal Relationship    Stability/Clinical Decision Making Evolving/Moderate complexity    Rehab Potential Good    PT Frequency 1x / week    PT Duration 12 weeks    PT Treatment/Interventions ADLs/Self Care Home Management;Electrical Stimulation;Moist Heat;DME Instruction;Gait training;Stair training;Cryotherapy;Ultrasound;Functional mobility training;Neuromuscular re-education;Balance training;Therapeutic exercise;Therapeutic activities;Patient/family education;Orthotic Fit/Training;Manual techniques;Passive range of motion;Energy conservation;Taping;Canalith Repostioning;Vestibular;Visual/perceptual remediation/compensation    PT Next Visit Plan advance HEP    PT Home Exercise Plan see HEP (medbridge link)    Consulted and Agree with Plan of Care Patient           Patient will benefit from skilled therapeutic intervention in order to improve the following deficits and impairments:  Abnormal gait,Decreased activity tolerance,Decreased balance,Decreased endurance,Decreased coordination,Decreased mobility,Decreased strength,Decreased range of motion,Pain,Improper body mechanics,Postural dysfunction,Impaired flexibility,Difficulty walking,Cardiopulmonary status limiting activity,Decreased knowledge of use of DME,Obesity  Visit Diagnosis: Muscle weakness (generalized)  Unsteadiness on feet  Gait abnormality     Problem List Patient Active Problem List   Diagnosis Date Noted  . Vitamin D deficiency 08/22/2019  . History of thrombosis 08/22/2019  . Chronic fatigue 07/17/2019  .  Osteoporosis 05/12/2019  . STEC (Shiga toxin-producing Escherichia coli) infection 04/06/2019  . Diverticulitis 04/06/2019  . [place]rombocytopenia ([place] 01/23/2019  . Leukopenia 01/23/2019  . Lymphedema of both lower extremities 01/16/2019  . Polyarthralgia 10/14/2018  . Headache disorder 10/14/2018  . Chronic daily headache 10/11/2018  . COPD (chronic obstructive pulmonary disease) ([place] 10/11/2018  . Thoracic aortic atherosclerosis ([place] 10/11/2018  .  Multiple episodes of deep venous thrombosis ([place]) 09/17/2018  . IBS (irritable bowel syndrome) 09/17/2018  . Healthcare maintenance 09/17/2018  . Primary osteoarthritis of right hip 09/03/2018  . Presence of right hip implant 09/03/2018  . Morbid obesity with BMI of 45.0-49.9, adult ([place]) 08/23/2018  . Shortness of breath 08/22/2018  . History of ductal carcinoma in situ (DCIS) of breast 07/20/2018  . Aromatase inhibitor use 07/20/2018  . Abnormal findings on auscultation 07/16/2018  . Encounter for long-term (current) use of high-risk medication 07/16/2018  . Fibromyalgia 07/16/2018  . Other forms of systemic lupus erythematosus ([place]) 07/16/2018  . Other osteoporosis without current pathological fracture 07/16/2018  . Seronegative arthritis 07/16/2018    Salem Caster. Fairly IV, PT, DPT Physical Therapist- [facility]  10/19/2020, 3:30 PM  [place] MAIN [facility] SERVICES [address] [place], [place], [address] Phone: [phone]   Fax:  [phone]  Name: Victoria [name] MRN: [id number] Date of Birth: [birth year]

## 2020-10-20 ENCOUNTER — Ambulatory Visit
Admission: RE | Admit: 2020-10-20 | Discharge: 2020-10-20 | Disposition: A | Discharge status: Home / Self Care | Payer: Medicare Other | Source: Ambulatory Visit | Attending: Oncology | Admitting: Oncology

## 2020-10-20 ENCOUNTER — Other Ambulatory Visit: Payer: Self-pay

## 2020-10-20 DIAGNOSIS — Z86000 Personal history of in-situ neoplasm of breast: Secondary | ICD-10-CM | POA: Insufficient documentation

## 2020-10-20 DIAGNOSIS — Z1231 Encounter for screening mammogram for malignant neoplasm of breast: Secondary | ICD-10-CM | POA: Insufficient documentation

## 2020-10-20 DIAGNOSIS — M810 Age-related osteoporosis without current pathological fracture: Secondary | ICD-10-CM | POA: Insufficient documentation

## 2020-10-20 DIAGNOSIS — Z79811 Long term (current) use of aromatase inhibitors: Secondary | ICD-10-CM | POA: Diagnosis not present

## 2020-10-26 ENCOUNTER — Other Ambulatory Visit: Payer: Self-pay

## 2020-10-26 ENCOUNTER — Ambulatory Visit: Payer: Medicare Other

## 2020-10-26 DIAGNOSIS — M6281 Muscle weakness (generalized): Secondary | ICD-10-CM

## 2020-10-26 DIAGNOSIS — R2681 Unsteadiness on feet: Secondary | ICD-10-CM | POA: Diagnosis not present

## 2020-10-26 DIAGNOSIS — R269 Unspecified abnormalities of gait and mobility: Secondary | ICD-10-CM

## 2020-10-26 DIAGNOSIS — M79604 Pain in right leg: Secondary | ICD-10-CM

## 2020-10-26 NOTE — Therapy (Signed)
[place] MAIN [facility] SERVICES [address] [place], [place], [address] Phone: [phone]   Fax:  [phone]  Physical Therapy Evaluation  Patient Details  Name: Victoria [name] MRN: [id number] Date of Birth: 08-19-1960 Referring Provider (PT): Vladimir Crofts, MD   Encounter Date: 10/26/2020   PT End of Session - 10/26/20 1704    Visit Number 12    Number of Visits 29    Date for PT Re-Evaluation 12/29/20    Authorization Type Prog note and recert performed 5/0/3546    PT Start Time 1508    PT Stop Time 1549    PT Time Calculation (min) 41 min    Equipment Utilized During Treatment Gait belt    Activity Tolerance Patient tolerated treatment well    Behavior During Therapy [facility] for tasks assessed/performed           Past Medical History:  Diagnosis Date  . Allergy   . Asthma   . Cancer ([place])    Breast Left, DCIS 2018  . Collagen vascular disease (HCC)    Lupus  . COPD (chronic obstructive pulmonary disease) ([place])   . Diverticulitis   . Fibromyalgia   . GERD (gastroesophageal reflux disease)   . Headache    Migraines  . Hearing impaired person, left    bilateral hearing aids  . IBS (irritable bowel syndrome)   . Lupus ([place])   . Mitral valve disorder   . MS (multiple sclerosis) ([place])   . Neuromuscular disorder ([place])   . Neuropathy   . Osteoarthritis   . Osteoporosis 05/12/2019  . Personal history of radiation therapy 2017   left   . Rheumatoid arthritis (HCC)    hands and legs  . Seizures ([place])    no seizures for 10 years    Past Surgical History:  Procedure Laterality Date  . ABDOMINAL HYSTERECTOMY    . APPENDECTOMY    . BREAST EXCISIONAL BIOPSY Left 2017   lumpectomy with radation  . BREAST EXCISIONAL BIOPSY Left 2004   neg surgical bx   . BREAST LUMPECTOMY Left 2017   DCIS  . BREAST SURGERY    . CHOLECYSTECTOMY    . COLONOSCOPY WITH PROPOFOL N/A 07/07/2019   Procedure: COLONOSCOPY WITH PROPOFOL;  Surgeon:  Toledo, Benay Pike, MD;  Location: [facility] END[facility];  Service: Gastroenterology;  Laterality: N/A;  . DIRECT LARYNGOSCOPY N/A 09/22/2020   Procedure: SUSPENSION MICRODIRECT LARYNGOSCOPY WITH BIOPSY;  Surgeon: Carloyn Manner, MD;  Location: [place];  Service: ENT;  Laterality: N/A;  has port a cath needs accessed  . ESOPHAGOGASTRODUODENOSCOPY (EGD) WITH PROPOFOL N/A 07/07/2019   Procedure: ESOPHAGOGASTRODUODENOSCOPY (EGD) WITH PROPOFOL;  Surgeon: Toledo, Benay Pike, MD;  Location: [facility];  Service: Gastroenterology;  Laterality: N/A;  . PATENT DUCTUS ARTERIOUS REPAIR    . RECONSTRUCTION TENDON PULLEY HAND Left   . TOTAL HIP ARTHROPLASTY Right     There were no vitals filed for this visit.    Subjective Assessment - 10/26/20 1508    Subjective Pt reports 8/10 pain today. Pt reports no falls or near falls.    Pertinent History Per chart: PMH includes: Lupus, seronegative arthritis, fibromyalgia, osteoporosis, breast cancer(radiation), morbid obesity, DVTs, IBS,COPD, chronic migraine, lymphedema, thrombocytopenia. Seizures, RA, MS? back pain that radiates down into both hips and legs. She has bilateral foot numbness. Toes will turn purple.    Limitations Sitting;Walking    How long can you sit comfortably? Pt says sitting limited to 20 minutes, worsened  with spinal extension and improved with flexion.    How long can you stand comfortably? no reported difficulty    How long can you walk comfortably? Pt reports difficulty with prolonged walking    Diagnostic tests MRI done on 05/10/2020 (see physician note)    Patient Stated Goals Pt would like to increae LE strength and decrease pain in order to walk her dog    Currently in Pain? Yes    Pain Score 8     Pain Location Knee    Pain Orientation Right    Pain Onset More than a month ago           TREATMENT  There.ex:              Seated marches with 3# AWs for improved hip flexor strength 3x15 B.              Seated  LAQ's: 3# AWs 3x15; Pt rates exercise "medium"  Seated adductor squeezes with ball 3x15; pt rates exercise "easy"  Neuro Re-ed: CGA for all  Standing on airex pad x30 sec, no UE support Standing on airex pad EC 2x30 sec no UE support, slight increase in postural sway Standing on airex pad with NBOS 2x30 sec, slight increase in postural sway Standing on airex pad semi-tandem 4x30 sec each LE as main stance LE, pt requires intermittent UE support  In // bars, forward ambulation over airex beam with step over obstacle 10x back and forth. Pt requires intermittent UE support and up to min assist x1 due to LOB in order to regain balance.  In // bars, lateral stepping over airex beam with step over obstacle 10x back and forth. Pt requires intermittent UE support and up to min assist x1 due to LOB in order to regain balance. Pt rates side-stepping easier than forward ambulation over airex beam.  Narrow stance on wobble disc 3x30 sec; pt requires intermittent UE support and up to min assist to maintain balance     Education provided throughout session in the form of demonstration, VC/TC in order to facilitate movement at target joints and correct muscle activation with exercises.    PT Education - 10/26/20 1704    Education Details Form/technique with balance exercises.    Person(s) Educated Patient    Methods Explanation;Verbal cues;Demonstration    Comprehension Verbalized understanding;Returned demonstration             PT Short Term Goals - 10/06/20 1513      PT SHORT TERM GOAL #1   Title Patient will be independent in home exercise program to improve strength/mobility for better functional independence with ADLs.    Baseline 08/17/2020 Pt issued HEP; 10/06/2020 Pt reports she is confident with HEP, advanced HEP to be issued future session    Time 6    Period Weeks    Status On-going    Target Date 11/17/20             PT Long Term Goals - 10/06/20 1514      PT LONG TERM GOAL  #1   Title Patient will increase FOTO score to equal to or greater than 60 to demonstrate statistically significant improvement in mobility and quality of life.    Baseline 08/17/2020 Pt score 55 (risk-adjusted 50); 10/06/2020 48    Time 12    Period Weeks    Status On-going    Target Date 12/29/20      PT LONG TERM GOAL #2   Title  Pt will increase MMT scores by at least 1/2 point to indicate increased B LE strength for performing functional activities.    Baseline 08/17/2020 MMT L/R hip flexion 3/5, 3/5; hip abduction 3/5, 3+/5; hip adduction 2+/5, 3-/5; knee flexion 4-/5, 3+/5, knee extension 4-/5, 3/5; ankle 3/5 B; ankle plantarflexion 4/5 B; 10/06/2020 Hip flexion 4/5 B; hip abduction 4+/5 B; hip adduction 3+/5 and pain limited; knee extension 4+/5 B, L knee flexion 4+/5, R knee flexion 5/5; B ankle plantarflexion and dorsiflexion both 5/5 B    Time 8    Period Weeks    Status Achieved      PT LONG TERM GOAL #3   Title Patient will increase 10 meter walk test to >1.12ms as to improve gait speed for better community ambulation and to reduce fall risk.    Baseline 08/17/2020 0.65 m/s no AD; 0.71 m/s with no AD    Time 12    Period Weeks    Status On-going    Target Date 12/29/20      PT LONG TERM GOAL #4   Title Patient (< 635years old) will complete five times sit to stand test in < 10 seconds indicating an increased LE strength and improved balance.    Baseline 08/17/2020 5xSTS completed in 14.98 sec; 10/06/2020 13.81 sec    Time 12    Period Weeks    Status On-going    Target Date 12/29/20      PT LONG TERM GOAL #5   Title Patient will increase Berg Balance score by > 6 points to demonstrate decreased fall risk during functional activities.    Baseline 08/17/2020 Berg score 50/56; 10/06/2020 54/56    Time 12    Period Weeks    Status Partially Met    Target Date 12/29/20                  Plan - 10/26/20 1705    Clinical Impression Statement Pt highly motivated in therapy. Pt  did have some brief increases in hip and knee pain with some balance exercises today where pt was encouraged to rest and discontinue exercises until pain level decreased.  Pt still demonstrates difficulty with compliant surfaces with both dynamic and static tasks. Pt will continue to benefit from further skilled therapy to improve LE strength and balance to improve ease with all functional mobility.    Personal Factors and Comorbidities Comorbidity 1;Comorbidity 2;Comorbidity 3+;Fitness    Comorbidities pertinent: hx of thrombosis, chronic fatigue, osteoporisis, HA disorder, polyathralgia, COPD, IBS, Fibromyalgia, lupus    Examination-Activity Limitations Stairs;Transfers;Locomotion Level;Sleep;Squat;Stand;Reach Overhead    Examination-Participation Restrictions Community Activity;Meal Prep;Laundry;Shop;Volunteer;Yard Work;Interpersonal Relationship    Stability/Clinical Decision Making Evolving/Moderate complexity    Rehab Potential Good    PT Frequency 1x / week    PT Duration 12 weeks    PT Treatment/Interventions ADLs/Self Care Home Management;Electrical Stimulation;Moist Heat;DME Instruction;Gait training;Stair training;Cryotherapy;Ultrasound;Functional mobility training;Neuromuscular re-education;Balance training;Therapeutic exercise;Therapeutic activities;Patient/family education;Orthotic Fit/Training;Manual techniques;Passive range of motion;Energy conservation;Taping;Canalith Repostioning;Vestibular;Visual/perceptual remediation/compensation    PT Next Visit Plan advance HEP; continue current POC    PT Home Exercise Plan see HEP (medbridge link)    Consulted and Agree with Plan of Care Patient           Patient will benefit from skilled therapeutic intervention in order to improve the following deficits and impairments:  Abnormal gait,Decreased activity tolerance,Decreased balance,Decreased endurance,Decreased coordination,Decreased mobility,Decreased strength,Decreased range of  motion,Pain,Improper body mechanics,Postural dysfunction,Impaired flexibility,Difficulty walking,Cardiopulmonary status limiting activity,Decreased knowledge of use of DME,Obesity  Visit Diagnosis: Muscle weakness (generalized)  Unsteadiness on feet  Gait abnormality  Pain of right lower extremity     Problem List Patient Active Problem List   Diagnosis Date Noted  . Vitamin D deficiency 08/22/2019  . History of thrombosis 08/22/2019  . Chronic fatigue 07/17/2019  . Osteoporosis 05/12/2019  . STEC (Shiga toxin-producing Escherichia coli) infection 04/06/2019  . Diverticulitis 04/06/2019  . Thrombocytopenia ([place]) 01/23/2019  . Leukopenia 01/23/2019  . Lymphedema of both lower extremities 01/16/2019  . Polyarthralgia 10/14/2018  . Headache disorder 10/14/2018  . Chronic daily headache 10/11/2018  . COPD (chronic obstructive pulmonary disease) ([place]) 10/11/2018  . Thoracic aortic atherosclerosis ([place]) 10/11/2018  . Multiple episodes of deep venous thrombosis ([place]) 09/17/2018  . IBS (irritable bowel syndrome) 09/17/2018  . Healthcare maintenance 09/17/2018  . Primary osteoarthritis of right hip 09/03/2018  . Presence of right hip implant 09/03/2018  . Morbid obesity with BMI of 45.0-49.9, adult ([place]) 08/23/2018  . Shortness of breath 08/22/2018  . History of ductal carcinoma in situ (DCIS) of breast 07/20/2018  . Aromatase inhibitor use 07/20/2018  . Abnormal findings on auscultation 07/16/2018  . Encounter for long-term (current) use of high-risk medication 07/16/2018  . Fibromyalgia 07/16/2018  . Other forms of systemic lupus erythematosus ([place]) 07/16/2018  . Other osteoporosis without current pathological fracture 07/16/2018  . Seronegative arthritis 07/16/2018   Ricard Dillon PT, DPT 10/26/2020, 5:15 PM  [place] MAIN [facility] SERVICES [address] [place], [place], [address] Phone: [phone]   Fax:  [phone]  Name: Victoria [name] MRN: [id number] Date of Birth: May 22, 1961

## 2020-11-02 ENCOUNTER — Ambulatory Visit: Payer: Medicare Other

## 2020-11-02 ENCOUNTER — Encounter: Payer: Self-pay | Admitting: Student in an Organized Health Care Education/Training Program

## 2020-11-02 ENCOUNTER — Other Ambulatory Visit: Payer: Self-pay

## 2020-11-02 ENCOUNTER — Ambulatory Visit
Payer: Medicare Other | Attending: Student in an Organized Health Care Education/Training Program | Admitting: Student in an Organized Health Care Education/Training Program

## 2020-11-02 VITALS — BP 115/56 | HR 86 | Temp 96.9°F | Resp 16 | Ht <= 58 in | Wt 183.0 lb

## 2020-11-02 DIAGNOSIS — G894 Chronic pain syndrome: Secondary | ICD-10-CM | POA: Diagnosis present

## 2020-11-02 DIAGNOSIS — M5416 Radiculopathy, lumbar region: Secondary | ICD-10-CM | POA: Diagnosis present

## 2020-11-02 DIAGNOSIS — M797 Fibromyalgia: Secondary | ICD-10-CM | POA: Diagnosis present

## 2020-11-02 DIAGNOSIS — M2550 Pain in unspecified joint: Secondary | ICD-10-CM | POA: Diagnosis present

## 2020-11-02 DIAGNOSIS — M328 Other forms of systemic lupus erythematosus: Secondary | ICD-10-CM | POA: Diagnosis present

## 2020-11-02 DIAGNOSIS — M1611 Unilateral primary osteoarthritis, right hip: Secondary | ICD-10-CM | POA: Insufficient documentation

## 2020-11-02 DIAGNOSIS — M1380 Other specified arthritis, unspecified site: Secondary | ICD-10-CM | POA: Insufficient documentation

## 2020-11-02 MED ORDER — HYDROCODONE-ACETAMINOPHEN 7.5-325 MG PO TABS: 1.0000 | ORAL_TABLET | Freq: Three times a day (TID) | ORAL | 0 refills | Status: DC | PRN

## 2020-11-02 MED ORDER — HYDROCODONE-ACETAMINOPHEN 7.5-325 MG PO TABS: 1.0000 | ORAL_TABLET | Freq: Three times a day (TID) | ORAL | 0 refills | Status: AC | PRN

## 2020-11-02 NOTE — Progress Notes (Signed)
PROVIDER NOTE: Information contained herein reflects review and annotations entered in association with encounter. Interpretation of such information and data should be left to medically-trained personnel. Information provided to patient can be located elsewhere in the medical record under "Patient Instructions". Document created using STT-dictation technology, any transcriptional errors that may result from process are unintentional.    Patient: Victoria [name]  Service Category: E/M  Provider: Gillis Santa, MD  DOB: October 10, 1960  DOS: 11/02/2020  Specialty: Interventional Pain Management  MRN: [id number]  Setting: Ambulatory outpatient  PCP: Kirk Ruths, MD  Type: Established Patient    Referring Provider: Kirk Ruths, MD  Location: Office  Delivery: Face-to-face     HPI  [name], a 60 y.o. year old female, is here today because of her Chronic pain syndrome [G89.4]. [name] primary complain today is Joint Pain Last encounter: My last encounter with her was on 09/11/2020. Pertinent problems: [name] has COPD (chronic obstructive pulmonary disease) ([place]); Polyarthralgia; Morbid obesity with BMI of 45.0-49.9, adult ([place]); Osteoporosis; Chronic fatigue; and History of thrombosis on their pertinent problem list. Pain Assessment: Severity of Chronic pain is reported as a 8 /10. Location: Other (Comment) (joint)  / . Onset: More than a month ago. Quality: Aching,Dull,Nagging. Timing: Constant. Modifying factor(s): medications. Vitals:  height is 4' 7"  (1.397 m) and weight is 183 lb (83 kg). Her temporal temperature is 96.9 F (36.1 C) (abnormal). Her blood pressure is 115/56 (abnormal) and her pulse is 86. Her respiration is 16 and oxygen saturation is 99%.   Reason for encounter: medication management.    Since the last visit with me, the patient has been working with physical therapy.  She also saw neuromuscular physician at [facility] who did an EMG/nerve conduction velocity  study that did not show any evidence of nerve dysfunction.  She also had diarrhea and a bout of colitis that she is still recovering from.  She states that she was very weak during this time and had decreased energy.  Otherwise she continues her hydrocodone as prescribed.  She has been on this long-term.  We will refill as below.  We will also repeat annual urine toxicology screen for medication compliance monitoring.  Pharmacotherapy Assessment   Analgesic: Norco 7.5 mg twice daily as needed, quantity 60/month; MME equals 15    Monitoring: [place] PMP: PDMP reviewed during this encounter.       Pharmacotherapy: No side-effects or adverse reactions reported. Compliance: No problems identified. Effectiveness: Clinically acceptable.  Landis Martins, RN  11/02/2020 10:48 AM  Sign when Signing Visit Nursing Pain Medication Assessment:  Safety precautions to be maintained throughout the outpatient stay will include: orient to surroundings, keep bed in low position, maintain call bell within reach at all times, provide assistance with transfer out of bed and ambulation.  Medication Inspection Compliance: Pill count conducted under aseptic conditions, in front of the patient. Neither the pills nor the bottle was removed from the patient's sight at any time. Once count was completed pills were immediately returned to the patient in their original bottle.  Medication: Hydrocodone/APAP Pill/Patch Count: 15 of 90 pills remain Pill/Patch Appearance: Markings consistent with prescribed medication Bottle Appearance: Standard pharmacy container. Clearly labeled. Filled Date:10/09/2020 Last Medication intake:  Yesterday    UDS:  Summary  Date Value Ref Range Status  10/06/2019 Note  Final    Comment:    ==================================================================== ToxASSURE Select 13 (MW) ==================================================================== Test  Result        Flag       Units Drug Present not Declared for Prescription Verification   Hydrocodone                    67           UNEXPECTED ng/mg creat   Hydromorphone                  50           UNEXPECTED ng/mg creat   Norhydrocodone                 99           UNEXPECTED ng/mg creat    Sources of hydrocodone include scheduled prescription medications.    Hydromorphone and norhydrocodone are expected metabolites of    hydrocodone. Hydromorphone is also available as a scheduled    prescription medication. ==================================================================== Test                      Result    Flag   Units      Ref Range   Creatinine              132              mg/dL      >=20 ==================================================================== Declared Medications:  The flagging and interpretation on this report are based on the  following declared medications.  Unexpected results may arise from  inaccuracies in the declared medications.  **Note: The testing scope of this panel does not include the  following reported medications:  Albuterol  Albuterol (Combivent)  Atorvastatin  Benzonatate  Budesonide (Symbicort)  Cyclobenzaprine  Dabigatran (Pradaxa)  Folic Acid  Formoterol (Symbicort)  Gabapentin  Hydroxychloroquine  Ibuprofen  Ipratropium (Combivent)  Leflunomide (Arava)  Letrozole (Femara)  Methotrexate  Omeprazole  Ondansetron  Potassium (Klor-Con)  Prednisone  Tiotropium  Topiramate  Trazodone ==================================================================== For clinical consultation, please call [phone]. ====================================================================      ROS  Constitutional: Denies any fever or chills Gastrointestinal: No reported hemesis, hematochezia, vomiting, or acute GI distress Musculoskeletal: Diffuse musculoskeletal pain Neurological: No reported episodes of acute onset apraxia, aphasia, dysarthria,  agnosia, amnesia, paralysis, loss of coordination, or loss of consciousness  Medication Review  HYDROcodone-acetaminophen, Ipratropium-Albuterol, Probiotic-10, Tiotropium Bromide Monohydrate, albuterol, atorvastatin, azelastine, benzonatate, budesonide-formoterol, cholecalciferol, cyclobenzaprine, dabigatran, fluticasone, folic acid, gabapentin, hydroxychloroquine, letrozole, methotrexate, omeprazole, ondansetron, topiramate, traZODone, and vitamin C  History Review  Allergy: [name] is allergic to other, sodium hypochlorite, tape, codeine, flagyl [metronidazole], levofloxacin, and sulfa antibiotics. Drug: [name]  reports no history of drug use. Alcohol:  reports current alcohol use. Tobacco:  reports that she quit smoking about 21 years ago. Her smoking use included cigarettes. She has a 30.00 pack-year smoking history. She has never used smokeless tobacco. Social: [name]  reports that she quit smoking about 21 years ago. Her smoking use included cigarettes. She has a 30.00 pack-year smoking history. She has never used smokeless tobacco. She reports current alcohol use. She reports that she does not use drugs. Medical:  has a past medical history of Allergy, Asthma, Cancer ([place]), Collagen vascular disease ([place]), COPD (chronic obstructive pulmonary disease) ([place]), Diverticulitis, Fibromyalgia, GERD (gastroesophageal reflux disease), Headache, Hearing impaired person, left, IBS (irritable bowel syndrome), Lupus ([place]), Mitral valve disorder, MS (multiple sclerosis) ([place]), Neuromuscular disorder ([place]), Neuropathy, Osteoarthritis, Osteoporosis (05/12/2019), Personal history of radiation therapy (2017), Rheumatoid arthritis ([place]),  and Seizures ([place]). Surgical: [name]  has a past surgical history that includes Breast surgery; Abdominal hysterectomy; Appendectomy; Cholecystectomy; Patent ductus arterious repair; Total hip arthroplasty (Right); Reconstruction tendon pulley hand (Left);  Esophagogastroduodenoscopy (egd) with propofol (N/A, 07/07/2019); Colonoscopy with propofol (N/A, 07/07/2019); Breast lumpectomy (Left, 2017); Breast excisional biopsy (Left, 2017); Breast excisional biopsy (Left, 2004); and Direct laryngoscopy (N/A, 09/22/2020). Family: family history includes Arthritis in her brother; COPD in her mother; Diabetes in her brother; Stroke in her brother and father.  Laboratory Chemistry Profile   Renal Lab Results  Component Value Date   BUN 9 10/11/2020   CREATININE 0.69 10/11/2020   GFRAA >60 02/17/2020   GFRNONAA >60 10/11/2020     Hepatic Lab Results  Component Value Date   AST 27 10/11/2020   ALT 26 10/11/2020   ALBUMIN 4.3 10/11/2020   ALKPHOS 65 10/11/2020   LIPASE 46 10/11/2020     Electrolytes Lab Results  Component Value Date   NA 141 10/11/2020   K 3.1 (L) 10/11/2020   CL 111 10/11/2020   CALCIUM 9.5 10/11/2020     Bone No results found for: VD25OH, VD125OH2TOT, ZJ6967EL3, YB0175ZW2, 25OHVITD1, 25OHVITD2, 25OHVITD3, TESTOFREE, TESTOSTERONE   Inflammation (CRP: Acute Phase) (ESR: Chronic Phase) Lab Results  Component Value Date   CRP <0.8 09/12/2018   ESRSEDRATE 16 09/12/2018   LATICACIDVEN 0.6 10/11/2020       Note: Above Lab results reviewed.  Recent Imaging Review  MM 3D SCREEN BREAST BILATERAL CLINICAL DATA:  Screening.  EXAM: DIGITAL SCREENING BILATERAL MAMMOGRAM WITH TOMOSYNTHESIS AND CAD  TECHNIQUE: Bilateral screening digital craniocaudal and mediolateral oblique mammograms were obtained. Bilateral screening digital breast tomosynthesis was performed. The images were evaluated with computer-aided detection.  COMPARISON:  Previous exam(s).  ACR Breast Density Category b: There are scattered areas of fibroglandular density.  FINDINGS: There are no findings suspicious for malignancy. The images were evaluated with computer-aided detection.  IMPRESSION: No mammographic evidence of malignancy. A result  letter of this screening mammogram will be mailed directly to the patient.  RECOMMENDATION: Screening mammogram in one year. (Code:SM-B-01Y)  BI-RADS CATEGORY  1: Negative.  Electronically Signed   By: Lovey Newcomer M.D.   On: 10/21/2020 15:53 Note: Reviewed        Physical Exam  General appearance: Well nourished, well developed, and well hydrated. In no apparent acute distress Mental status: Alert, oriented x 3 (person, place, & time)       Respiratory: No evidence of acute respiratory distress Eyes: PERLA Vitals: BP (!) 115/56   Pulse 86   Temp (!) 96.9 F (36.1 C) (Temporal)   Resp 16   Ht 4' 7"  (1.397 m)   Wt 183 lb (83 kg)   SpO2 99%   BMI 42.53 kg/m  BMI: Estimated body mass index is 42.53 kg/m as calculated from the following:   Height as of this encounter: 4' 7"  (1.397 m).   Weight as of this encounter: 183 lb (83 kg). Ideal: Patient must be at least 60 in tall to calculate ideal body weight   Lumbar Exam  Skin & Axial Inspection:No masses, redness, or swelling Alignment:Symmetrical Functional HEN:IDPO restricted ROMaffecting both sides Stability:No instability detected Muscle Tone/Strength:Functionally intact. No obvious neuro-muscular anomalies detected. Sensory (Neurological):Musculoskeletal pain patternand dermatomal pain pattern  Gait & Posture Assessment  Ambulation:Patient ambulates using a cane Gait:Limited. Using assistive device to ambulate Posture:Difficulty standing up straight, due to pain  Lower Extremity Exam    Side:Right lower extremity  Side:Left  lower extremity  Stability:No instability observed  Stability:No instability observed  Skin & Extremity Inspection:Evidence of prior arthroplastic surgery  Skin & Extremity Inspection:Skin color, temperature, and hair growth are WNL. No peripheral edema or cyanosis. No masses, redness, swelling, asymmetry, or associated skin lesions. No contractures.   Functional NOM:VEHM restricted ROMfor all joints of the lower extremity   Functional CNO:BSJG restricted ROM   Muscle Tone/Strength:Functionally intact. No obvious neuro-muscular anomalies detected.  Muscle Tone/Strength:Functionally intact. No obvious neuro-muscular anomalies detected.  Sensory (Neurological):Dermatomal pain pattern  Sensory (Neurological):Unimpaired  DTR: Patellar:0: absent Achilles:deferred today Plantar:deferred today  DTR: Patellar:0: absent Achilles:deferred today Plantar:deferred today  Palpation:No palpable anomalies  Palpation:No palpable anomalies   Assessment   Status Diagnosis  Controlled Controlled Controlled 1. Chronic pain syndrome   2. Lumbar radiculopathy   3. Primary osteoarthritis of right hip   4. Seronegative arthritis   5. Polyarthralgia   6. Fibromyalgia   7. Other forms of systemic lupus erythematosus, unspecified organ involvement status ([place])       Plan of Care   [name] has a current medication list which includes the following long-term medication(s): albuterol, atorvastatin, azelastine, budesonide-formoterol, dabigatran, fluticasone, gabapentin, omeprazole, tiotropium bromide monohydrate, topiramate, and trazodone.  Pharmacotherapy (Medications Ordered): Meds ordered this encounter  Medications  . HYDROcodone-acetaminophen (NORCO) 7.5-325 MG tablet    Sig: Take 1 tablet by mouth every 8 (eight) hours as needed for severe pain. Must last 30 days.    Dispense:  90 tablet    Refill:  0    Chronic Pain. (STOP Act - Not applicable). Fill one day early if closed on scheduled refill date.  Marland Kitchen HYDROcodone-acetaminophen (NORCO) 7.5-325 MG tablet    Sig: Take 1 tablet by mouth every 8 (eight) hours as needed for severe pain. Must last 30 days.    Dispense:  90 tablet    Refill:  0    Chronic Pain. (STOP Act - Not applicable). Fill one day early if closed on scheduled  refill date.  Continue with PT, gabapentin 200 mg TID.  Orders:  Orders Placed This Encounter  Procedures  . ToxASSURE Select 13 (MW), Urine    Volume: 30 ml(s). Minimum 3 ml of urine is needed. Document temperature of fresh sample. Indications: Long term (current) use of opiate analgesic [phone])    Order Specific Question:   Release to patient    Answer:   Immediate   Follow-up plan:   Return in about 8 weeks (around 12/28/2020) for Medication Management, in person.   Recent Visits Date Type Provider Dept  08/31/20 Office Visit Gillis Santa, MD [facility]  Showing recent visits within past 90 days and meeting all other requirements Today's Visits Date Type Provider Dept  11/02/20 Office Visit Gillis Santa, MD [facility]  Showing today's visits and meeting all other requirements Future Appointments Date Type Provider Dept  12/21/20 Appointment Gillis Santa, MD [facility]  Showing future appointments within next 90 days and meeting all other requirements  I discussed the assessment and treatment plan with the patient. The patient was provided an opportunity to ask questions and all were answered. The patient agreed with the plan and demonstrated an understanding of the instructions.  Patient advised to call back or seek an in-person evaluation if the symptoms or condition worsens.  Duration of encounter: 56mnutes.  Note by: BGillis Santa MD Date: 11/02/2020; Time: 11:16 AM

## 2020-11-02 NOTE — Progress Notes (Signed)
Nursing Pain Medication Assessment:  Safety precautions to be maintained throughout the outpatient stay will include: orient to surroundings, keep bed in low position, maintain call bell within reach at all times, provide assistance with transfer out of bed and ambulation.  Medication Inspection Compliance: Pill count conducted under aseptic conditions, in front of the patient. Neither the pills nor the bottle was removed from the patient's sight at any time. Once count was completed pills were immediately returned to the patient in their original bottle.  Medication: Hydrocodone/APAP Pill/Patch Count: 15 of 90 pills remain Pill/Patch Appearance: Markings consistent with prescribed medication Bottle Appearance: Standard pharmacy container. Clearly labeled. Filled Date:10/09/2020 Last Medication intake:  Yesterday

## 2020-11-03 ENCOUNTER — Ambulatory Visit: Payer: Medicare Other

## 2020-11-03 DIAGNOSIS — M6281 Muscle weakness (generalized): Secondary | ICD-10-CM

## 2020-11-03 DIAGNOSIS — R269 Unspecified abnormalities of gait and mobility: Secondary | ICD-10-CM

## 2020-11-03 NOTE — Therapy (Signed)
[place] [facility] SERVICES [address] [place], [place], [address] Phone: [phone]   Fax:  [phone]  Patient Details  Name: Song Myre MRN: [id number] Date of Birth: [birth year] Referring Provider:  Vladimir Crofts, MD  Encounter Date: 11/03/2020  Pt had initiated session and was on nustep at level 1 for approx 2 min before pt had to leave unexpectedly due to family emergency, ending session. This session is unbilled.   Ricard Dillon PT, DPT 11/03/2020, 3:35 PM  [place] [facility] SERVICES [address] [place], [place], [address] Phone: [phone]   Fax:  [phone]

## 2020-11-08 LAB — TOXASSURE SELECT 13 (MW), URINE

## 2020-11-09 ENCOUNTER — Ambulatory Visit: Payer: Medicare Other | Attending: Neurology

## 2020-11-09 DIAGNOSIS — R278 Other lack of coordination: Secondary | ICD-10-CM | POA: Insufficient documentation

## 2020-11-09 DIAGNOSIS — R2689 Other abnormalities of gait and mobility: Secondary | ICD-10-CM | POA: Insufficient documentation

## 2020-11-09 DIAGNOSIS — M6281 Muscle weakness (generalized): Secondary | ICD-10-CM | POA: Insufficient documentation

## 2020-11-09 DIAGNOSIS — R2681 Unsteadiness on feet: Secondary | ICD-10-CM | POA: Insufficient documentation

## 2020-11-11 ENCOUNTER — Ambulatory Visit: Payer: Medicare Other

## 2020-11-15 ENCOUNTER — Ambulatory Visit (INDEPENDENT_AMBULATORY_CARE_PROVIDER_SITE_OTHER): Payer: Medicare Other | Admitting: Gastroenterology

## 2020-11-15 ENCOUNTER — Other Ambulatory Visit: Payer: Self-pay

## 2020-11-15 ENCOUNTER — Encounter: Payer: Self-pay | Admitting: Gastroenterology

## 2020-11-15 VITALS — BP 113/69 | HR 80 | Temp 97.4°F | Ht <= 58 in | Wt 191.2 lb

## 2020-11-15 DIAGNOSIS — K219 Gastro-esophageal reflux disease without esophagitis: Secondary | ICD-10-CM | POA: Diagnosis not present

## 2020-11-15 DIAGNOSIS — K582 Mixed irritable bowel syndrome: Secondary | ICD-10-CM | POA: Diagnosis not present

## 2020-11-15 MED ORDER — DICYCLOMINE HCL 20 MG PO TABS: 20.0000 mg | ORAL_TABLET | Freq: Three times a day (TID) | ORAL | 6 refills | Status: DC

## 2020-11-15 MED ORDER — OMEPRAZOLE 40 MG PO CPDR: 40.0000 mg | DELAYED_RELEASE_CAPSULE | Freq: Two times a day (BID) | ORAL | 6 refills | Status: AC

## 2020-11-15 NOTE — Progress Notes (Signed)
Gastroenterology Consultation  Referring Provider:     Kirk Ruths, MD Primary Care Physician:  Kirk Ruths, MD Primary Gastroenterologist:  Dr. Allen Norris     Reason for Consultation:     Left-sided abdominal pain        HPI:   Victoria [name] is a 60 y.o. y/o female referred for consultation & management of left side abdominal pain by Dr. Ouida Sills, Ocie Cornfield, MD.  This patient comes in after being seen at the [facility] clinic for her abdominal pain and history of diverticulitis.  The patient's last colonoscopy November 2020 showed diverticulosis with biopsies showing:  DIAGNOSIS:  A. STOMACH, ANTRUM; COLD BIOPSY:  - ANTRAL MUCOSA WITH MILD REACTIVE GASTRITIS.  - OXYNTIC MUCOSA WITH NO SIGNIFICANT PATHOLOGIC ABNORMALITY.  - CHANGES SUGGESTIVE OF PROTON PUMP INHIBITOR USE.  - NEGATIVE FOR H. PYLORI, DYSPLASIA, AND MALIGNANCY.   B. GE JUNCTION; COLD BIOPSY:  - SQUAMOCOLUMNAR MUCOSA WITH GASTROESOPHAGITIS.  - NEGATIVE FOR INTESTINAL METAPLASIA, DYSPLASIA, AND MALIGNANCY.   C. RECTUM; COLD BIOPSY:  - COLONIC TYPE MUCOSA WITH NON-SPECIFIC REACTIVE MUCOSAL CHANGES.  - NEGATIVE FOR ACTIVE PROCTITIS, DYSPLASIA, AND MALIGNANCY.   The patient was followed down in [place] and was suggested to undergo a partial colectomy due to the recurrent diverticulitis but the patient then moved to [place].  Her abdominal pain is in the left side and she reports it to be associated with diarrhea.  She does not feel better after bowel movement.  The patient was tried on hyoscyamine.  The patient also has a history of gastritis and had report in the past of continued GERD-like symptoms despite being on PPI.  The patient now comes to me because she does not feel like her concerns are being addressed.  The patient reports that she has been having constipation recently and reports that she has not moved her bowels for 5 days.  The patient also states that when she was in the ER she did not  have a bowel movement in over a week when she presented there.  There is no report of any unexplained weight loss black stools or bloody stools.  The patient also denies any fevers or chills.  She has run out of her omeprazole and is now having heartburn.  The patient is not sure how much dicyclomine she has tried in the past but states that the dose she was on did not help her.  She also does partake in dairy intake.   Past Medical History:  Diagnosis Date  . Allergy   . Asthma   . Cancer ([place])    Breast Left, DCIS 2018  . Collagen vascular disease (HCC)    Lupus  . COPD (chronic obstructive pulmonary disease) ([place])   . Diverticulitis   . Fibromyalgia   . GERD (gastroesophageal reflux disease)   . Headache    Migraines  . Hearing impaired person, left    bilateral hearing aids  . IBS (irritable bowel syndrome)   . Lupus ([place])   . Mitral valve disorder   . MS (multiple sclerosis) ([place])   . Neuromuscular disorder ([place])   . Neuropathy   . Osteoarthritis   . Osteoporosis 05/12/2019  . Personal history of radiation therapy 2017   left   . Rheumatoid arthritis (HCC)    hands and legs  . Seizures ([place])    no seizures for 10 years    Past Surgical History:  Procedure Laterality Date  . ABDOMINAL HYSTERECTOMY    .  APPENDECTOMY    . BREAST EXCISIONAL BIOPSY Left 2017   lumpectomy with radation  . BREAST EXCISIONAL BIOPSY Left 2004   neg surgical bx   . BREAST LUMPECTOMY Left 2017   DCIS  . BREAST SURGERY    . CHOLECYSTECTOMY    . COLONOSCOPY WITH PROPOFOL N/A 07/07/2019   Procedure: COLONOSCOPY WITH PROPOFOL;  Surgeon: Toledo, Benay Pike, MD;  Location: [facility];  Service: Gastroenterology;  Laterality: N/A;  . DIRECT LARYNGOSCOPY N/A 09/22/2020   Procedure: SUSPENSION MICRODIRECT LARYNGOSCOPY WITH BIOPSY;  Surgeon: Carloyn Manner, MD;  Location: [place];  Service: ENT;  Laterality: N/A;  has port a cath needs accessed  . ESOPHAGOGASTRODUODENOSCOPY (EGD)  WITH PROPOFOL N/A 07/07/2019   Procedure: ESOPHAGOGASTRODUODENOSCOPY (EGD) WITH PROPOFOL;  Surgeon: Toledo, Benay Pike, MD;  Location: [facility];  Service: Gastroenterology;  Laterality: N/A;  . PATENT DUCTUS ARTERIOUS REPAIR    . RECONSTRUCTION TENDON PULLEY HAND Left   . TOTAL HIP ARTHROPLASTY Right     Prior to Admission medications   Medication Sig Start Date End Date Taking? Authorizing Provider  albuterol (PROVENTIL HFA;VENTOLIN HFA) 108 (90 Base) MCG/ACT inhaler Inhale 2 puffs into the lungs every 6 (six) hours as needed for wheezing or shortness of breath. 06/17/18   Elby Beck, FNP  Ascorbic Acid (VITAMIN C) 1000 MG tablet Take 1,000 mg by mouth daily.    [provider]  atorvastatin (LIPITOR) 40 MG tablet Take 40 mg by mouth daily.    [provider]  azelastine (ASTELIN) 0.1 % nasal spray Place 2 sprays into both nostrils 2 (two) times daily. 03/03/20   [provider]  budesonide-formoterol (SYMBICORT) 160-4.5 MCG/ACT inhaler Inhale 2 puffs into the lungs 2 (two) times daily. 05/06/18   Paulette Blanch, MD  cholecalciferol (VITAMIN D3) 25 MCG (1000 UNIT) tablet Take 1,000 Units by mouth daily.    [provider]  cyclobenzaprine (FLEXERIL) 10 MG tablet TAKE 1 TABLET BY MOUTH TWICE A DAY AS NEEDED FOR MUSCLE SPASMS 09/25/18   Elby Beck, FNP  dabigatran (PRADAXA) 150 MG CAPS capsule Take 1 capsule (150 mg total) by mouth 2 (two) times daily. 06/17/18   Elby Beck, FNP  fluticasone (FLONASE) 50 MCG/ACT nasal spray Place into both nostrils. 04/03/20   [provider]  folic acid (FOLVITE) 1 MG tablet Take by mouth. 07/13/20 07/13/21  [provider]  gabapentin (NEURONTIN) 100 MG capsule 200 mg 3 (three) times daily.  12/15/19   [provider]  HYDROcodone-acetaminophen (NORCO) 7.5-325 MG tablet Take 1 tablet by mouth every 8 (eight) hours as needed for severe pain. Must last 30 days. 11/08/20 12/08/20   Gillis Santa, MD  HYDROcodone-acetaminophen (NORCO) 7.5-325 MG tablet Take 1 tablet by mouth every 8 (eight) hours as needed for severe pain. Must last 30 days. 12/08/20 01/07/21  Gillis Santa, MD  hydroxychloroquine (PLAQUENIL) 200 MG tablet Take 200 mg by mouth daily.    [provider]  Ipratropium-Albuterol (COMBIVENT RESPIMAT) 20-100 MCG/ACT AERS respimat INHALE 2 PUFFS BY MOUTH INTO THE LUNGS EVERY 4 TIMES DAILY AS NEEDED FOR WHEEZING 07/20/20   [provider]  letrozole (FEMARA) 2.5 MG tablet TAKE 1 TABLET BY MOUTH ONCE DAILY 10/04/20   Earlie Server, MD  methotrexate 2.5 MG tablet Take 2.5 mg by mouth once a week. 07/21/19   [provider]  omeprazole (PRILOSEC) 40 MG capsule Take 1 capsule (40 mg total) by mouth 2 (two) times daily. 07/12/18   Carlean Purl,  Dalbert Batman, FNP  ondansetron (ZOFRAN) 4 MG tablet Take 1 tablet (4 mg total) by mouth every 8 (eight) hours as needed for up to 10 doses for nausea or vomiting. 09/22/20   Vaught, Jeannie Fend, MD  ondansetron (ZOFRAN-ODT) 8 MG disintegrating tablet Take by mouth every 8 (eight) hours as needed.  Patient not taking: Reported on 11/02/2020 04/03/19   [provider]  Probiotic Product (PROBIOTIC-10) CHEW Chew by mouth daily.    [provider]  Tiotropium Bromide Monohydrate (SPIRIVA RESPIMAT) 2.5 MCG/ACT AERS Inhale 2 puffs into the lungs every morning. 06/17/18   Elby Beck, FNP  topiramate (TOPAMAX) 50 MG tablet Take 50 mg by mouth 2 (two) times daily. 09/16/19   [provider]  traZODone (DESYREL) 50 MG tablet Take 3 tablets (150 mg total) by mouth at bedtime. 06/17/18   Elby Beck, FNP    Family History  Problem Relation Age of Onset  . COPD Mother   . Stroke Father   . Arthritis Brother   . Diabetes Brother   . Stroke Brother   . Breast cancer Neg Hx      Social History   Tobacco Use  . Smoking status: Former Smoker    Packs/day: 2.00    Years: 15.00    Pack years:  30.00    Types: Cigarettes    Quit date: 06/27/1999    Years since quitting: 21.4  . Smokeless tobacco: Never Used  Vaping Use  . Vaping Use: Never used  Substance Use Topics  . Alcohol use: Yes    Comment: Rarely - approx 6 beers once a month  . Drug use: Never    Allergies as of 11/15/2020 - Review Complete 11/02/2020  Allergen Reaction Noted  . Other  01/06/2020  . Sodium hypochlorite Other (See Comments) 07/16/2018  . Tape  08/22/2019  . Codeine  05/05/2018  . Flagyl [metronidazole]  06/25/2018  . Levofloxacin  04/17/2019  . Sulfa antibiotics  05/05/2018    Review of Systems:    All systems reviewed and negative except where noted in HPI.   Physical Exam:  There were no vitals taken for this visit. No LMP recorded. Patient has had a hysterectomy. General:   Alert,  Well-developed, well-nourished, pleasant and cooperative in NAD Head:  Normocephalic and atraumatic. Eyes:  Sclera clear, no icterus.   Conjunctiva pink. Ears:  Normal auditory acuity. Neck:  Supple; no masses or thyromegaly. Lungs:  Respirations even and unlabored.  Clear throughout to auscultation.   No wheezes, crackles, or rhonchi. No acute distress. Heart:  Regular rate and rhythm; no murmurs, clicks, rubs, or gallops. Abdomen:  Normal bowel sounds.  No bruits.  Soft, non-tender and non-distended without masses, hepatosplenomegaly or hernias noted.  No guarding or rebound tenderness.  Negative Carnett sign.   Rectal:  Deferred.  Pulses:  Normal pulses noted. Extremities:  No clubbing or edema.  No cyanosis. Neurologic:  Alert and oriented x3;  grossly normal neurologically. Skin:  Intact without significant lesions or rashes.  No jaundice. Lymph Nodes:  No significant cervical adenopathy. Psych:  Alert and cooperative. Normal mood and affect.  Imaging Studies: MM 3D SCREEN BREAST BILATERAL  Result Date: 10/21/2020 CLINICAL DATA:  Screening. EXAM: DIGITAL SCREENING BILATERAL MAMMOGRAM WITH  TOMOSYNTHESIS AND CAD TECHNIQUE: Bilateral screening digital craniocaudal and mediolateral oblique mammograms were obtained. Bilateral screening digital breast tomosynthesis was performed. The images were evaluated with computer-aided detection. COMPARISON:  Previous exam(s). ACR Breast Density Category b: There are  scattered areas of fibroglandular density. FINDINGS: There are no findings suspicious for malignancy. The images were evaluated with computer-aided detection. IMPRESSION: No mammographic evidence of malignancy. A result letter of this screening mammogram will be mailed directly to the patient. RECOMMENDATION: Screening mammogram in one year. (Code:SM-B-01Y) BI-RADS CATEGORY  1: Negative. Electronically Signed   By: Lovey Newcomer M.D.   On: 10/21/2020 15:53    Assessment and Plan:   Victoria [name] is a 60 y.o. y/o female who comes in today with alternating diarrhea and constipation who is constipated now.  The patient has been told to start MiraLAX twice a day to clean herself out and when she starts having results to stop the MiraLAX.  She has also been told to start on Citrucel once to twice a day.  She has been told that her symptoms are most consistent with irritable bowel syndrome with alternating diarrhea and constipation.  This is unlikely related to her diverticulitis that she has had in the past.  She is not have any symptoms of diverticulitis at present.  She will be started on dicyclomine 20 mg 3 times a day because we are not aware of the dose she tried in the past and she may have been on a lower dose and that is why she did not get relief.  The patient and her sister have been explained the plan and agree with it.    Lucilla Lame, MD. Marval Regal    Note: This dictation was prepared with Dragon dictation along with smaller phrase technology. Any transcriptional errors that result from this process are unintentional.

## 2020-11-16 ENCOUNTER — Ambulatory Visit: Payer: Medicare Other

## 2020-11-16 DIAGNOSIS — R2689 Other abnormalities of gait and mobility: Secondary | ICD-10-CM | POA: Diagnosis not present

## 2020-11-16 DIAGNOSIS — M6281 Muscle weakness (generalized): Secondary | ICD-10-CM

## 2020-11-16 DIAGNOSIS — R2681 Unsteadiness on feet: Secondary | ICD-10-CM | POA: Diagnosis present

## 2020-11-16 DIAGNOSIS — R278 Other lack of coordination: Secondary | ICD-10-CM

## 2020-11-16 NOTE — Therapy (Signed)
[place] MAIN [facility] SERVICES [address] [place], [place], [address] Phone: [phone]   Fax:  [phone]  Physical Therapy Treatment  Patient Details  Name: Victoria [name] MRN: [id number] Date of Birth: May 25, 1961 Referring Provider (PT): Vladimir Crofts, MD   Encounter Date: 11/16/2020   PT End of Session - 11/16/20 1152    Visit Number 13    Number of Visits 29    Date for PT Re-Evaluation 12/29/20    Authorization Type Prog note and recert performed 09/14/3660    PT Start Time 1102    PT Stop Time 1130    PT Time Calculation (min) 28 min    Equipment Utilized During Treatment Gait belt    Activity Tolerance Patient tolerated treatment well    Behavior During Therapy [facility] for tasks assessed/performed           Past Medical History:  Diagnosis Date  . Allergy   . Asthma   . Cancer ([place])    Breast Left, DCIS 2018  . Collagen vascular disease (HCC)    Lupus  . COPD (chronic obstructive pulmonary disease) ([place])   . Diverticulitis   . Fibromyalgia   . GERD (gastroesophageal reflux disease)   . Headache    Migraines  . Hearing impaired person, left    bilateral hearing aids  . IBS (irritable bowel syndrome)   . Lupus ([place])   . Mitral valve disorder   . MS (multiple sclerosis) ([place])   . Neuromuscular disorder ([place])   . Neuropathy   . Osteoarthritis   . Osteoporosis 05/12/2019  . Personal history of radiation therapy 2017   left   . Rheumatoid arthritis (HCC)    hands and legs  . Seizures ([place])    no seizures for 10 years    Past Surgical History:  Procedure Laterality Date  . ABDOMINAL HYSTERECTOMY    . APPENDECTOMY    . BREAST EXCISIONAL BIOPSY Left 2017   lumpectomy with radation  . BREAST EXCISIONAL BIOPSY Left 2004   neg surgical bx   . BREAST LUMPECTOMY Left 2017   DCIS  . BREAST SURGERY    . CHOLECYSTECTOMY    . COLONOSCOPY WITH PROPOFOL N/A 07/07/2019   Procedure: COLONOSCOPY WITH PROPOFOL;  Surgeon:  Toledo, Benay Pike, MD;  Location: [facility];  Service: Gastroenterology;  Laterality: N/A;  . DIRECT LARYNGOSCOPY N/A 09/22/2020   Procedure: SUSPENSION MICRODIRECT LARYNGOSCOPY WITH BIOPSY;  Surgeon: Carloyn Manner, MD;  Location: [place];  Service: ENT;  Laterality: N/A;  has port a cath needs accessed  . ESOPHAGOGASTRODUODENOSCOPY (EGD) WITH PROPOFOL N/A 07/07/2019   Procedure: ESOPHAGOGASTRODUODENOSCOPY (EGD) WITH PROPOFOL;  Surgeon: Toledo, Benay Pike, MD;  Location: [facility];  Service: Gastroenterology;  Laterality: N/A;  . PATENT DUCTUS ARTERIOUS REPAIR    . RECONSTRUCTION TENDON PULLEY HAND Left   . TOTAL HIP ARTHROPLASTY Right     There were no vitals filed for this visit.   Subjective Assessment - 11/16/20 1143    Subjective Pt reports she has migraine today. Pt still recovering from being sick last week and reports she has lingering cough she is taking medicine.    Pertinent History Per chart: PMH includes: Lupus, seronegative arthritis, fibromyalgia, osteoporosis, breast cancer(radiation), morbid obesity, DVTs, IBS,COPD, chronic migraine, lymphedema, thrombocytopenia. Seizures, RA, MS? back pain that radiates down into both hips and legs. She has bilateral foot numbness. Toes will turn purple.    Limitations Sitting;Walking    How long can  you sit comfortably? Pt says sitting limited to 20 minutes, worsened with spinal extension and improved with flexion.    How long can you stand comfortably? no reported difficulty    How long can you walk comfortably? Pt reports difficulty with prolonged walking    Diagnostic tests MRI done on 05/10/2020 (see physician note)    Patient Stated Goals Pt would like to increae LE strength and decrease pain in order to walk her dog    Currently in Pain? Yes    Pain Onset More than a month ago          TREATMENT  Nustep level 2 x 4 min (unbilled)  Neuro Re-ED:  [place] Hard: CGA-min assist for the following Tux racer  with decreasing levels of UE support 3x; pt with greatest difficulty without UE support, difficulty with smaller weight-shifts laterally and maintaining stability Maze 3x with decreasing levels of UE support; slight LOB with posterior weight-shift requiring UE support to regain balance  Standing on airex WBOS throwing balls at target 1x, CGA  Standing on airex, NBOS throwing balls at target 1x, CGA slight decrease in postural stability Standing on airex semi-tandem stance 1x with each LE as stance LE; pt requires up to min assist with exercise to regain balance after LOB  VC/TC and demo provided throughout session with exercises for correct movement at target joints and correct muscle activation.     PT Education - 11/16/20 1152    Education Details form/technique with airex balance and coordination exercise    Person(s) Educated Patient    Methods Explanation;Verbal cues;Demonstration    Comprehension Verbalized understanding;Returned demonstration            PT Short Term Goals - 10/06/20 1513      PT SHORT TERM GOAL #1   Title Patient will be independent in home exercise program to improve strength/mobility for better functional independence with ADLs.    Baseline 08/17/2020 Pt issued HEP; 10/06/2020 Pt reports she is confident with HEP, advanced HEP to be issued future session    Time 6    Period Weeks    Status On-going    Target Date 11/17/20             PT Long Term Goals - 10/06/20 1514      PT LONG TERM GOAL #1   Title Patient will increase FOTO score to equal to or greater than 60 to demonstrate statistically significant improvement in mobility and quality of life.    Baseline 08/17/2020 Pt score 55 (risk-adjusted 50); 10/06/2020 48    Time 12    Period Weeks    Status On-going    Target Date 12/29/20      PT LONG TERM GOAL #2   Title Pt will increase MMT scores by at least 1/2 point to indicate increased B LE strength for performing functional activities.     Baseline 08/17/2020 MMT L/R hip flexion 3/5, 3/5; hip abduction 3/5, 3+/5; hip adduction 2+/5, 3-/5; knee flexion 4-/5, 3+/5, knee extension 4-/5, 3/5; ankle 3/5 B; ankle plantarflexion 4/5 B; 10/06/2020 Hip flexion 4/5 B; hip abduction 4+/5 B; hip adduction 3+/5 and pain limited; knee extension 4+/5 B, L knee flexion 4+/5, R knee flexion 5/5; B ankle plantarflexion and dorsiflexion both 5/5 B    Time 8    Period Weeks    Status Achieved      PT LONG TERM GOAL #3   Title Patient will increase 10 meter walk test to >1.29ms as  to improve gait speed for better community ambulation and to reduce fall risk.    Baseline 08/17/2020 0.65 m/s no AD; 0.71 m/s with no AD    Time 12    Period Weeks    Status On-going    Target Date 12/29/20      PT LONG TERM GOAL #4   Title Patient (< 87 years old) will complete five times sit to stand test in < 10 seconds indicating an increased LE strength and improved balance.    Baseline 08/17/2020 5xSTS completed in 14.98 sec; 10/06/2020 13.81 sec    Time 12    Period Weeks    Status On-going    Target Date 12/29/20      PT LONG TERM GOAL #5   Title Patient will increase Berg Balance score by > 6 points to demonstrate decreased fall risk during functional activities.    Baseline 08/17/2020 Berg score 50/56; 10/06/2020 54/56    Time 12    Period Weeks    Status Partially Met    Target Date 12/29/20             Plan - 11/16/20 1153    Clinical Impression Statement Session slightly limited secondary to pt needing to leave early for other committments. The pt has difficulty with lateral weight-shifts on korebalance exercises requiring intermittent UE support and up to min assist to regain alance after LOB to R side. Similarly, the pt required up to min assist to maintain balance with tandem stance on airex pad with throwing balls at target. The pt will benefit from further skilled therapy to improve balance in order to increase safety with ADLs.    Personal Factors  and Comorbidities Comorbidity 1;Comorbidity 2;Comorbidity 3+;Fitness    Comorbidities pertinent: hx of thrombosis, chronic fatigue, osteoporisis, HA disorder, polyathralgia, COPD, IBS, Fibromyalgia, lupus    Examination-Activity Limitations Stairs;Transfers;Locomotion Level;Sleep;Squat;Stand;Reach Overhead    Examination-Participation Restrictions Community Activity;Meal Prep;Laundry;Shop;Volunteer;Yard Work;Interpersonal Relationship    Stability/Clinical Decision Making Evolving/Moderate complexity    Rehab Potential Good    PT Frequency 1x / week    PT Duration 12 weeks    PT Treatment/Interventions ADLs/Self Care Home Management;Electrical Stimulation;Moist Heat;DME Instruction;Gait training;Stair training;Cryotherapy;Ultrasound;Functional mobility training;Neuromuscular re-education;Balance training;Therapeutic exercise;Therapeutic activities;Patient/family education;Orthotic Fit/Training;Manual techniques;Passive range of motion;Energy conservation;Taping;Canalith Repostioning;Vestibular;Visual/perceptual remediation/compensation    PT Next Visit Plan advance HEP; continue current POC, balance and cooridnation exercises    PT Home Exercise Plan see HEP (medbridge link)    Consulted and Agree with Plan of Care Patient           Patient will benefit from skilled therapeutic intervention in order to improve the following deficits and impairments:  Abnormal gait,Decreased activity tolerance,Decreased balance,Decreased endurance,Decreased coordination,Decreased mobility,Decreased strength,Decreased range of motion,Pain,Improper body mechanics,Postural dysfunction,Impaired flexibility,Difficulty walking,Cardiopulmonary status limiting activity,Decreased knowledge of use of DME,Obesity  Visit Diagnosis: Unsteadiness on feet  Muscle weakness (generalized)  Other lack of coordination     Problem List Patient Active Problem List   Diagnosis Date Noted  . Prediabetes 09/24/2020  . Vitamin  D deficiency 08/22/2019  . History of thrombosis 08/22/2019  . Chronic fatigue 07/17/2019  . Osteoporosis 05/12/2019  . STEC (Shiga toxin-producing Escherichia coli) infection 04/06/2019  . Diverticulitis 04/06/2019  . Thrombocytopenia ([place]) 01/23/2019  . Leukopenia 01/23/2019  . Lymphedema of both lower extremities 01/16/2019  . Chronic migraine without aura 11/22/2018  . Polyarthralgia 10/14/2018  . Headache disorder 10/14/2018  . Chronic daily headache 10/11/2018  . COPD (chronic obstructive pulmonary disease) ([place]) 10/11/2018  .  Thoracic aortic atherosclerosis ([place]) 10/11/2018  . Multiple episodes of deep venous thrombosis ([place]) 09/17/2018  . IBS (irritable bowel syndrome) 09/17/2018  . Healthcare maintenance 09/17/2018  . Primary osteoarthritis of right hip 09/03/2018  . Presence of right hip implant 09/03/2018  . Morbid obesity with BMI of 45.0-49.9, adult ([place]) 08/23/2018  . Shortness of breath 08/22/2018  . History of ductal carcinoma in situ (DCIS) of breast 07/20/2018  . Aromatase inhibitor use 07/20/2018  . Abnormal findings on auscultation 07/16/2018  . Encounter for long-term (current) use of high-risk medication 07/16/2018  . Fibromyalgia 07/16/2018  . Other forms of systemic lupus erythematosus ([place]) 07/16/2018  . Other osteoporosis without current pathological fracture 07/16/2018  . Seronegative arthritis 07/16/2018   Ricard Dillon PT, DPT 11/16/2020, 12:00 PM  [place] MAIN [facility] SERVICES [address] [place], [place], [address] Phone: [phone]   Fax:  [phone]  Name: Mena Simonis MRN: [id number] Date of Birth: [birth year]

## 2020-11-18 ENCOUNTER — Ambulatory Visit: Payer: Medicare Other

## 2020-11-23 ENCOUNTER — Ambulatory Visit: Payer: Medicare Other

## 2020-11-23 ENCOUNTER — Other Ambulatory Visit: Payer: Self-pay

## 2020-11-23 DIAGNOSIS — R2689 Other abnormalities of gait and mobility: Secondary | ICD-10-CM | POA: Diagnosis not present

## 2020-11-23 DIAGNOSIS — M6281 Muscle weakness (generalized): Secondary | ICD-10-CM

## 2020-11-23 NOTE — Therapy (Signed)
[place] MAIN [facility] SERVICES [address] [place], [place], [address] Phone: [phone]   Fax:  [phone]  Physical Therapy Treatment  Patient Details  Name: Victoria [name] MRN: [id number] Date of Birth: 20-Aug-1960 Referring Provider (PT): Vladimir Crofts, MD   Encounter Date: 11/23/2020   PT End of Session - 11/23/20 1253    Visit Number 14    Number of Visits 29    Date for PT Re-Evaluation 12/29/20    Authorization Type Prog note and recert performed 10/05/5398    PT Start Time 1106    PT Stop Time 1130    PT Time Calculation (min) 24 min    Equipment Utilized During Treatment Gait belt    Activity Tolerance Patient tolerated treatment well    Behavior During Therapy [facility] for tasks assessed/performed           Past Medical History:  Diagnosis Date  . Allergy   . Asthma   . Cancer ([place])    Breast Left, DCIS 2018  . Collagen vascular disease (HCC)    Lupus  . COPD (chronic obstructive pulmonary disease) ([place])   . Diverticulitis   . Fibromyalgia   . GERD (gastroesophageal reflux disease)   . Headache    Migraines  . Hearing impaired person, left    bilateral hearing aids  . IBS (irritable bowel syndrome)   . Lupus ([place])   . Mitral valve disorder   . MS (multiple sclerosis) ([place])   . Neuromuscular disorder ([place])   . Neuropathy   . Osteoarthritis   . Osteoporosis 05/12/2019  . Personal history of radiation therapy 2017   left   . Rheumatoid arthritis (HCC)    hands and legs  . Seizures ([place])    no seizures for 10 years    Past Surgical History:  Procedure Laterality Date  . ABDOMINAL HYSTERECTOMY    . APPENDECTOMY    . BREAST EXCISIONAL BIOPSY Left 2017   lumpectomy with radation  . BREAST EXCISIONAL BIOPSY Left 2004   neg surgical bx   . BREAST LUMPECTOMY Left 2017   DCIS  . BREAST SURGERY    . CHOLECYSTECTOMY    . COLONOSCOPY WITH PROPOFOL N/A 07/07/2019   Procedure: COLONOSCOPY WITH PROPOFOL;  Surgeon:  Toledo, Benay Pike, MD;  Location: [facility] END[facility]COPY;  Service: Gastroenterology;  Laterality: N/A;  . DIRECT LARYNGOSCOPY N/A 09/22/2020   Procedure: SUSPENSION MICRODIRECT LARYNGOSCOPY WITH BIOPSY;  Surgeon: Carloyn Manner, MD;  Location: [place];  Service: ENT;  Laterality: N/A;  has port a cath needs accessed  . ESOPHAGOGASTRODUODENOSCOPY (EGD) WITH PROPOFOL N/A 07/07/2019   Procedure: ESOPHAGOGASTRODUODENOSCOPY (EGD) WITH PROPOFOL;  Surgeon: Toledo, Benay Pike, MD;  Location: [facility];  Service: Gastroenterology;  Laterality: N/A;  . PATENT DUCTUS ARTERIOUS REPAIR    . RECONSTRUCTION TENDON PULLEY HAND Left   . TOTAL HIP ARTHROPLASTY Right     There were no vitals filed for this visit.   Subjective Assessment - 11/23/20 1251    Subjective Pt says LE pain is 8/10 today. She has routine follow-up/blood draw tomorrow.    Pertinent History Per chart: PMH includes: Lupus, seronegative arthritis, fibromyalgia, osteoporosis, breast cancer(radiation), morbid obesity, DVTs, IBS,COPD, chronic migraine, lymphedema, thrombocytopenia. Seizures, RA, MS? back pain that radiates down into both hips and legs. She has bilateral foot numbness. Toes will turn purple.    Limitations Sitting;Walking    How long can you sit comfortably? Pt says sitting limited to 20 minutes, worsened  with spinal extension and improved with flexion.    How long can you stand comfortably? no reported difficulty    How long can you walk comfortably? Pt reports difficulty with prolonged walking    Diagnostic tests MRI done on 05/10/2020 (see physician note)    Patient Stated Goals Pt would like to increae LE strength and decrease pain in order to walk her dog    Currently in Pain? Yes    Pain Score 8     Pain Location Leg    Pain Orientation Right;Left    Pain Onset More than a month ago            TREATMENT  Therex:  STS 1x12, 2x10  Seated marches 4# AW - 2x20; Demo for technique; pt rates difficult    LAQ with 4# AW - 1x20,1x12; Demo for technique; pt reports feeling in the low back, exercise discontinued  Standing hip abduction with 3# AW - 3x10 BLEs; VC/demo for technique  Standing hip extension 3# AW - 3x10 BLEs; pt reports she can feel exercise in R hip.  Standing heel raises with 3# AW -  2x20; VC/demo for technique  Education provided throughout in the form of demo, VC to facilitate movement at target joints and correct muscle activation with exercises. Pt demonstrates good carryover within session with cues.      PT Education - 11/23/20 1253    Education Details exercise technique, body mechanics    Person(s) Educated Patient    Methods Explanation;Verbal cues;Demonstration    Comprehension Verbalized understanding;Returned demonstration            PT Short Term Goals - 10/06/20 1513      PT SHORT TERM GOAL #1   Title Patient will be independent in home exercise program to improve strength/mobility for better functional independence with ADLs.    Baseline 08/17/2020 Pt issued HEP; 10/06/2020 Pt reports she is confident with HEP, advanced HEP to be issued future session    Time 6    Period Weeks    Status On-going    Target Date 11/17/20             PT Long Term Goals - 10/06/20 1514      PT LONG TERM GOAL #1   Title Patient will increase FOTO score to equal to or greater than 60 to demonstrate statistically significant improvement in mobility and quality of life.    Baseline 08/17/2020 Pt score 55 (risk-adjusted 50); 10/06/2020 48    Time 12    Period Weeks    Status On-going    Target Date 12/29/20      PT LONG TERM GOAL #2   Title Pt will increase MMT scores by at least 1/2 point to indicate increased B LE strength for performing functional activities.    Baseline 08/17/2020 MMT L/R hip flexion 3/5, 3/5; hip abduction 3/5, 3+/5; hip adduction 2+/5, 3-/5; knee flexion 4-/5, 3+/5, knee extension 4-/5, 3/5; ankle 3/5 B; ankle plantarflexion 4/5 B; 10/06/2020 Hip  flexion 4/5 B; hip abduction 4+/5 B; hip adduction 3+/5 and pain limited; knee extension 4+/5 B, L knee flexion 4+/5, R knee flexion 5/5; B ankle plantarflexion and dorsiflexion both 5/5 B    Time 8    Period Weeks    Status Achieved      PT LONG TERM GOAL #3   Title Patient will increase 10 meter walk test to >1.36ms as to improve gait speed for better community ambulation and to reduce  fall risk.    Baseline 08/17/2020 0.65 m/s no AD; 0.71 m/s with no AD    Time 12    Period Weeks    Status On-going    Target Date 12/29/20      PT LONG TERM GOAL #4   Title Patient (< 45 years old) will complete five times sit to stand test in < 10 seconds indicating an increased LE strength and improved balance.    Baseline 08/17/2020 5xSTS completed in 14.98 sec; 10/06/2020 13.81 sec    Time 12    Period Weeks    Status On-going    Target Date 12/29/20      PT LONG TERM GOAL #5   Title Patient will increase Berg Balance score by > 6 points to demonstrate decreased fall risk during functional activities.    Baseline 08/17/2020 Berg score 50/56; 10/06/2020 54/56    Time 12    Period Weeks    Status Partially Met    Target Date 12/29/20                 Plan - 11/23/20 1254    Clinical Impression Statement Session significantly limited d/t pt arrival time. Pt tolerates majority of therex well without reports of pain, however, she did report feeling low back discomfort with LAQ with 4# AW. PT decreased AW to 3# for remaining exercises where pt did not report sx. The pt will benefit from further skilled therapy to improve BLE strength in order to increase ease with functional mobility.    Personal Factors and Comorbidities Comorbidity 1;Comorbidity 2;Comorbidity 3+;Fitness    Comorbidities pertinent: hx of thrombosis, chronic fatigue, osteoporisis, HA disorder, polyathralgia, COPD, IBS, Fibromyalgia, lupus    Examination-Activity Limitations Stairs;Transfers;Locomotion Level;Sleep;Squat;Stand;Reach  Overhead    Examination-Participation Restrictions Community Activity;Meal Prep;Laundry;Shop;Volunteer;Yard Work;Interpersonal Relationship    Stability/Clinical Decision Making Evolving/Moderate complexity    Rehab Potential Good    PT Frequency 1x / week    PT Duration 12 weeks    PT Treatment/Interventions ADLs/Self Care Home Management;Electrical Stimulation;Moist Heat;DME Instruction;Gait training;Stair training;Cryotherapy;Ultrasound;Functional mobility training;Neuromuscular re-education;Balance training;Therapeutic exercise;Therapeutic activities;Patient/family education;Orthotic Fit/Training;Manual techniques;Passive range of motion;Energy conservation;Taping;Canalith Repostioning;Vestibular;Visual/perceptual remediation/compensation    PT Next Visit Plan advance HEP; continue current POC, balance and cooridnation exercises; progress resistance with exercises    PT Home Exercise Plan see HEP (medbridge link)    Consulted and Agree with Plan of Care Patient           Patient will benefit from skilled therapeutic intervention in order to improve the following deficits and impairments:  Abnormal gait,Decreased activity tolerance,Decreased balance,Decreased endurance,Decreased coordination,Decreased mobility,Decreased strength,Decreased range of motion,Pain,Improper body mechanics,Postural dysfunction,Impaired flexibility,Difficulty walking,Cardiopulmonary status limiting activity,Decreased knowledge of use of DME,Obesity  Visit Diagnosis: Muscle weakness (generalized)  Other abnormalities of gait and mobility     Problem List Patient Active Problem List   Diagnosis Date Noted  . Prediabetes 09/24/2020  . Vitamin D deficiency 08/22/2019  . History of thrombosis 08/22/2019  . Chronic fatigue 07/17/2019  . Osteoporosis 05/12/2019  . STEC (Shiga toxin-producing Escherichia coli) infection 04/06/2019  . Diverticulitis 04/06/2019  . Thrombocytopenia ([place]) 01/23/2019  . Leukopenia  01/23/2019  . Lymphedema of both lower extremities 01/16/2019  . Chronic migraine without aura 11/22/2018  . Polyarthralgia 10/14/2018  . Headache disorder 10/14/2018  . Chronic daily headache 10/11/2018  . COPD (chronic obstructive pulmonary disease) ([place]) 10/11/2018  . Thoracic aortic atherosclerosis ([place]) 10/11/2018  . Multiple episodes of deep venous thrombosis ([place]) 09/17/2018  . IBS (irritable bowel syndrome)  09/17/2018  . Healthcare maintenance 09/17/2018  . Primary osteoarthritis of right hip 09/03/2018  . Presence of right hip implant 09/03/2018  . Morbid obesity with BMI of 45.0-49.9, adult ([place]) 08/23/2018  . Shortness of breath 08/22/2018  . History of ductal carcinoma in situ (DCIS) of breast 07/20/2018  . Aromatase inhibitor use 07/20/2018  . Abnormal findings on auscultation 07/16/2018  . Encounter for long-term (current) use of high-risk medication 07/16/2018  . Fibromyalgia 07/16/2018  . Other forms of systemic lupus erythematosus ([place]) 07/16/2018  . Other osteoporosis without current pathological fracture 07/16/2018  . Seronegative arthritis 07/16/2018   Ricard Dillon PT, DPT 11/23/2020, 4:44 PM  [place] MAIN [facility] SERVICES [address] [place], [place], [address] Phone: [phone]   Fax:  [phone]  Name: Victoria [name] MRN: [id number] Date of Birth: [birth year]

## 2020-11-25 ENCOUNTER — Inpatient Hospital Stay: Payer: Medicare Other | Attending: Oncology

## 2020-11-29 ENCOUNTER — Other Ambulatory Visit: Payer: Self-pay

## 2020-11-29 ENCOUNTER — Inpatient Hospital Stay: Payer: Medicare Other | Attending: Oncology

## 2020-11-29 ENCOUNTER — Ambulatory Visit
Admission: RE | Admit: 2020-11-29 | Discharge: 2020-11-29 | Disposition: A | Discharge status: Home / Self Care | Payer: Medicare Other | Source: Ambulatory Visit | Attending: Oncology | Admitting: Oncology

## 2020-11-29 DIAGNOSIS — M810 Age-related osteoporosis without current pathological fracture: Secondary | ICD-10-CM | POA: Insufficient documentation

## 2020-11-29 DIAGNOSIS — Z79811 Long term (current) use of aromatase inhibitors: Secondary | ICD-10-CM | POA: Diagnosis present

## 2020-11-29 DIAGNOSIS — Z452 Encounter for adjustment and management of vascular access device: Secondary | ICD-10-CM | POA: Insufficient documentation

## 2020-11-29 DIAGNOSIS — Z95828 Presence of other vascular implants and grafts: Secondary | ICD-10-CM

## 2020-11-29 DIAGNOSIS — D0512 Intraductal carcinoma in situ of left breast: Secondary | ICD-10-CM | POA: Diagnosis not present

## 2020-11-29 MED ORDER — SODIUM CHLORIDE 0.9% FLUSH
10.0000 mL | Freq: Once | INTRAVENOUS | Status: AC
Start: 2020-11-29 — End: 2020-11-29
  Administered 2020-11-29: 10 mL via INTRAVENOUS
  Filled 2020-11-29: qty 10

## 2020-11-29 MED ORDER — HEPARIN SOD (PORK) LOCK FLUSH 100 UNIT/ML IV SOLN
500.0000 [IU] | Freq: Once | INTRAVENOUS | Status: AC
  Administered 2020-11-29: 500 [IU] via INTRAVENOUS
  Filled 2020-11-29: qty 5

## 2020-11-29 MED ORDER — HEPARIN SOD (PORK) LOCK FLUSH 100 UNIT/ML IV SOLN
INTRAVENOUS | Status: AC
  Filled 2020-11-29: qty 5

## 2020-11-30 ENCOUNTER — Ambulatory Visit: Payer: Medicare Other

## 2020-11-30 DIAGNOSIS — R2689 Other abnormalities of gait and mobility: Secondary | ICD-10-CM | POA: Diagnosis not present

## 2020-11-30 DIAGNOSIS — R2681 Unsteadiness on feet: Secondary | ICD-10-CM

## 2020-11-30 DIAGNOSIS — M6281 Muscle weakness (generalized): Secondary | ICD-10-CM

## 2020-11-30 NOTE — Therapy (Signed)
[place] MAIN [facility] SERVICES [address] [place], [place], [address] Phone: [phone]   Fax:  [phone]  Physical Therapy Treatment  Patient Details  Name: Victoria [name] MRN: [id number] Date of Birth: 1960/12/06 Referring Provider (PT): Vladimir Crofts, MD   Encounter Date: 11/30/2020   PT End of Session - 11/30/20 1238    Visit Number 15    Number of Visits 29    Date for PT Re-Evaluation 12/29/20    Authorization Type Prog note and recert performed 01/06/8314    PT Start Time 1021    PT Stop Time 1100    PT Time Calculation (min) 39 min    Equipment Utilized During Treatment Gait belt    Activity Tolerance Patient tolerated treatment well    Behavior During Therapy [facility] for tasks assessed/performed           Past Medical History:  Diagnosis Date  . Allergy   . Asthma   . Cancer ([place])    Breast Left, DCIS 2018  . Collagen vascular disease (HCC)    Lupus  . COPD (chronic obstructive pulmonary disease) ([place])   . Diverticulitis   . Fibromyalgia   . GERD (gastroesophageal reflux disease)   . Headache    Migraines  . Hearing impaired person, left    bilateral hearing aids  . IBS (irritable bowel syndrome)   . Lupus ([place])   . Mitral valve disorder   . MS (multiple sclerosis) ([place])   . Neuromuscular disorder ([place])   . Neuropathy   . Osteoarthritis   . Osteoporosis 05/12/2019  . Personal history of radiation therapy 2017   left   . Rheumatoid arthritis (HCC)    hands and legs  . Seizures ([place])    no seizures for 10 years    Past Surgical History:  Procedure Laterality Date  . ABDOMINAL HYSTERECTOMY    . APPENDECTOMY    . BREAST EXCISIONAL BIOPSY Left 2017   lumpectomy with radation  . BREAST EXCISIONAL BIOPSY Left 2004   neg surgical bx   . BREAST LUMPECTOMY Left 2017   DCIS  . BREAST SURGERY    . CHOLECYSTECTOMY    . COLONOSCOPY WITH PROPOFOL N/A 07/07/2019   Procedure: COLONOSCOPY WITH PROPOFOL;  Surgeon:  Toledo, Benay Pike, MD;  Location: [facility];  Service: Gastroenterology;  Laterality: N/A;  . DIRECT LARYNGOSCOPY N/A 09/22/2020   Procedure: SUSPENSION MICRODIRECT LARYNGOSCOPY WITH BIOPSY;  Surgeon: Carloyn Manner, MD;  Location: [place];  Service: ENT;  Laterality: N/A;  has port a cath needs accessed  . ESOPHAGOGASTRODUODENOSCOPY (EGD) WITH PROPOFOL N/A 07/07/2019   Procedure: ESOPHAGOGASTRODUODENOSCOPY (EGD) WITH PROPOFOL;  Surgeon: Toledo, Benay Pike, MD;  Location: [facility];  Service: Gastroenterology;  Laterality: N/A;  . PATENT DUCTUS ARTERIOUS REPAIR    . RECONSTRUCTION TENDON PULLEY HAND Left   . TOTAL HIP ARTHROPLASTY Right     There were no vitals filed for this visit.   Subjective Assessment - 11/30/20 1105    Subjective Pt says she spent time gardening this weekend and is a little sore still today. Pt has R hip pain.    Pertinent History Per chart: PMH includes: Lupus, seronegative arthritis, fibromyalgia, osteoporosis, breast cancer(radiation), morbid obesity, DVTs, IBS,COPD, chronic migraine, lymphedema, thrombocytopenia. Seizures, RA, MS? back pain that radiates down into both hips and legs. She has bilateral foot numbness. Toes will turn purple.    Limitations Sitting;Walking    How long can you sit comfortably? Pt  says sitting limited to 20 minutes, worsened with spinal extension and improved with flexion.    How long can you stand comfortably? no reported difficulty    How long can you walk comfortably? Pt reports difficulty with prolonged walking    Diagnostic tests MRI done on 05/10/2020 (see physician note)    Patient Stated Goals Pt would like to increae LE strength and decrease pain in order to walk her dog    Currently in Pain? Yes    Pain Location Hip    Pain Orientation Right    Pain Onset More than a month ago           TREATMENT  NEURO RE-ED:  Korebalance: BUE support to UUE support.  Mini-golf -  3x (increasing difficulty with  each level), focus on large and small weight-shifts in all planes, coordination focus. VC provided for technique.   THEREX: Leg press 25# - 3x10; pt reports "medium." VC for technique.  Plantarflexion "press" on leg press- discontinued due to R hip pain after 4 reps.   Seated adductor squeeze with ball - 2x20; pt reports slight decrease in R hip pain.   STS - 3x10; pt reports R hip pain pain slightly present but no longer "bad."   Standing heel raises at support surface 2x15. No pain with exercise. VC/demo for technique.   Education provided throughout in the form of demo, VC to facilitate movement at target joints and correct muscle activation with exercises. Pt demonstrates good carryover within session with cues.    Plan - 11/30/20 1609    Clinical Impression Statement Pt has greatest challenge with coordination with weight-shifts/ankle strategies during Korebalance exercises. Pt still reverts to using small step to increase degree of platform shift/tilt and uses BUE to UUE support. Pt attempted plantarflexion exercise on leg press but experienced brief sharp pain in R hip that decreased with rest (exercise was discontinued). Pt without any pain when she performed standard leg press exercise prior. The pt will continue to benefit from further skilled PT in order to improve balance and BLE strength to increase ease with all functional mobility.    Personal Factors and Comorbidities Comorbidity 1;Comorbidity 2;Comorbidity 3+;Fitness    Comorbidities pertinent: hx of thrombosis, chronic fatigue, osteoporisis, HA disorder, polyathralgia, COPD, IBS, Fibromyalgia, lupus    Examination-Activity Limitations Stairs;Transfers;Locomotion Level;Sleep;Squat;Stand;Reach Overhead    Examination-Participation Restrictions Community Activity;Meal Prep;Laundry;Shop;Volunteer;Yard Work;Interpersonal Relationship    Stability/Clinical Decision Making Evolving/Moderate complexity    Rehab Potential Good    PT  Frequency 1x / week    PT Duration 12 weeks    PT Treatment/Interventions ADLs/Self Care Home Management;Electrical Stimulation;Moist Heat;DME Instruction;Gait training;Stair training;Cryotherapy;Ultrasound;Functional mobility training;Neuromuscular re-education;Balance training;Therapeutic exercise;Therapeutic activities;Patient/family education;Orthotic Fit/Training;Manual techniques;Passive range of motion;Energy conservation;Taping;Canalith Repostioning;Vestibular;Visual/perceptual remediation/compensation    PT Next Visit Plan advance HEP; continue current POC, balance and cooridnation exercises; progress resistance with exercises; ankle strategies    PT Home Exercise Plan see HEP (medbridge link)    Consulted and Agree with Plan of Care Patient              PT Education - 11/30/20 1238    Education Details exercise technique, body mechanics with standing heel raise    Person(s) Educated Patient    Methods Explanation;Verbal cues;Demonstration    Comprehension Verbalized understanding;Returned demonstration            PT Short Term Goals - 10/06/20 1513      PT SHORT TERM GOAL #1   Title Patient will be independent in  home exercise program to improve strength/mobility for better functional independence with ADLs.    Baseline 08/17/2020 Pt issued HEP; 10/06/2020 Pt reports she is confident with HEP, advanced HEP to be issued future session    Time 6    Period Weeks    Status On-going    Target Date 11/17/20             PT Long Term Goals - 10/06/20 1514      PT LONG TERM GOAL #1   Title Patient will increase FOTO score to equal to or greater than 60 to demonstrate statistically significant improvement in mobility and quality of life.    Baseline 08/17/2020 Pt score 55 (risk-adjusted 50); 10/06/2020 48    Time 12    Period Weeks    Status On-going    Target Date 12/29/20      PT LONG TERM GOAL #2   Title Pt will increase MMT scores by at least 1/2 point to indicate  increased B LE strength for performing functional activities.    Baseline 08/17/2020 MMT L/R hip flexion 3/5, 3/5; hip abduction 3/5, 3+/5; hip adduction 2+/5, 3-/5; knee flexion 4-/5, 3+/5, knee extension 4-/5, 3/5; ankle 3/5 B; ankle plantarflexion 4/5 B; 10/06/2020 Hip flexion 4/5 B; hip abduction 4+/5 B; hip adduction 3+/5 and pain limited; knee extension 4+/5 B, L knee flexion 4+/5, R knee flexion 5/5; B ankle plantarflexion and dorsiflexion both 5/5 B    Time 8    Period Weeks    Status Achieved      PT LONG TERM GOAL #3   Title Patient will increase 10 meter walk test to >1.85ms as to improve gait speed for better community ambulation and to reduce fall risk.    Baseline 08/17/2020 0.65 m/s no AD; 0.71 m/s with no AD    Time 12    Period Weeks    Status On-going    Target Date 12/29/20      PT LONG TERM GOAL #4   Title Patient (< 656years old) will complete five times sit to stand test in < 10 seconds indicating an increased LE strength and improved balance.    Baseline 08/17/2020 5xSTS completed in 14.98 sec; 10/06/2020 13.81 sec    Time 12    Period Weeks    Status On-going    Target Date 12/29/20      PT LONG TERM GOAL #5   Title Patient will increase Berg Balance score by > 6 points to demonstrate decreased fall risk during functional activities.    Baseline 08/17/2020 Berg score 50/56; 10/06/2020 54/56    Time 12    Period Weeks    Status Partially Met    Target Date 12/29/20            Patient will benefit from skilled therapeutic intervention in order to improve the following deficits and impairments:  Abnormal gait,Decreased activity tolerance,Decreased balance,Decreased endurance,Decreased coordination,Decreased mobility,Decreased strength,Decreased range of motion,Pain,Improper body mechanics,Postural dysfunction,Impaired flexibility,Difficulty walking,Cardiopulmonary status limiting activity,Decreased knowledge of use of DME,Obesity  Visit Diagnosis: Other abnormalities  of gait and mobility  Muscle weakness (generalized)  Unsteadiness on feet     Problem List Patient Active Problem List   Diagnosis Date Noted  . Prediabetes 09/24/2020  . Vitamin D deficiency 08/22/2019  . History of thrombosis 08/22/2019  . Chronic fatigue 07/17/2019  . Osteoporosis 05/12/2019  . STEC (Shiga toxin-producing Escherichia coli) infection 04/06/2019  . Diverticulitis 04/06/2019  . Thrombocytopenia ([place] 01/23/2019  . Leukopenia  01/23/2019  . Lymphedema of both lower extremities 01/16/2019  . Chronic migraine without aura 11/22/2018  . Polyarthralgia 10/14/2018  . Headache disorder 10/14/2018  . Chronic daily headache 10/11/2018  . COPD (chronic obstructive pulmonary disease) ([place]) 10/11/2018  . Thoracic aortic atherosclerosis ([place]) 10/11/2018  . Multiple episodes of deep venous thrombosis ([place]) 09/17/2018  . IBS (irritable bowel syndrome) 09/17/2018  . Healthcare maintenance 09/17/2018  . Primary osteoarthritis of right hip 09/03/2018  . Presence of right hip implant 09/03/2018  . Morbid obesity with BMI of 45.0-49.9, adult ([place]) 08/23/2018  . Shortness of breath 08/22/2018  . History of ductal carcinoma in situ (DCIS) of breast 07/20/2018  . Aromatase inhibitor use 07/20/2018  . Abnormal findings on auscultation 07/16/2018  . Encounter for long-term (current) use of high-risk medication 07/16/2018  . Fibromyalgia 07/16/2018  . Other forms of systemic lupus erythematosus ([place]) 07/16/2018  . Other osteoporosis without current pathological fracture 07/16/2018  . Seronegative arthritis 07/16/2018   Ricard Dillon PT, DPT 11/30/2020, 4:17 PM  [place] MAIN [facility] SERVICES [address] [place], [place], [address] Phone: [phone]   Fax:  [phone]  Name: Victoria [name] MRN: [id number] Date of Birth: 1961-07-02

## 2020-12-02 ENCOUNTER — Ambulatory Visit: Payer: Medicare Other

## 2020-12-07 ENCOUNTER — Ambulatory Visit: Payer: Medicare Other | Attending: Neurology

## 2020-12-07 ENCOUNTER — Other Ambulatory Visit: Payer: Self-pay

## 2020-12-07 ENCOUNTER — Encounter: Payer: Self-pay | Admitting: Oncology

## 2020-12-07 DIAGNOSIS — M79604 Pain in right leg: Secondary | ICD-10-CM | POA: Diagnosis present

## 2020-12-07 DIAGNOSIS — R2689 Other abnormalities of gait and mobility: Secondary | ICD-10-CM | POA: Diagnosis present

## 2020-12-07 DIAGNOSIS — R2681 Unsteadiness on feet: Secondary | ICD-10-CM | POA: Diagnosis present

## 2020-12-07 DIAGNOSIS — M545 Low back pain, unspecified: Secondary | ICD-10-CM | POA: Insufficient documentation

## 2020-12-07 DIAGNOSIS — M6281 Muscle weakness (generalized): Secondary | ICD-10-CM | POA: Diagnosis present

## 2020-12-07 DIAGNOSIS — G8929 Other chronic pain: Secondary | ICD-10-CM | POA: Diagnosis present

## 2020-12-07 DIAGNOSIS — M79605 Pain in left leg: Secondary | ICD-10-CM | POA: Diagnosis present

## 2020-12-07 NOTE — Therapy (Signed)
[place] MAIN [facility] SERVICES [address] [place], [place], [address] Phone: [phone]   Fax:  [phone]  Physical Therapy Treatment  Patient Details  Name: Victoria [name] MRN: [id number] Date of Birth: 01-20-1961 Referring Provider (PT): Vladimir Crofts, MD   Encounter Date: 12/07/2020   PT End of Session - 12/07/20 1653    Visit Number 16    Number of Visits 29    Date for PT Re-Evaluation 12/29/20    Authorization Type Prog note and recert performed 09/11/2351    PT Start Time 1038    PT Stop Time 1114    PT Time Calculation (min) 36 min    Equipment Utilized During Treatment Gait belt    Activity Tolerance Patient tolerated treatment well    Behavior During Therapy [facility] for tasks assessed/performed           Past Medical History:  Diagnosis Date  . Allergy   . Asthma   . Cancer ([place])    Breast Left, DCIS 2018  . Collagen vascular disease (HCC)    Lupus  . COPD (chronic obstructive pulmonary disease) ([place])   . Diverticulitis   . Fibromyalgia   . GERD (gastroesophageal reflux disease)   . Headache    Migraines  . Hearing impaired person, left    bilateral hearing aids  . IBS (irritable bowel syndrome)   . Lupus ([place])   . Mitral valve disorder   . MS (multiple sclerosis) ([place])   . Neuromuscular disorder ([place])   . Neuropathy   . Osteoarthritis   . Osteoporosis 05/12/2019  . Personal history of radiation therapy 2017   left   . Rheumatoid arthritis (HCC)    hands and legs  . Seizures ([place])    no seizures for 10 years    Past Surgical History:  Procedure Laterality Date  . ABDOMINAL HYSTERECTOMY    . APPENDECTOMY    . BREAST EXCISIONAL BIOPSY Left 2017   lumpectomy with radation  . BREAST EXCISIONAL BIOPSY Left 2004   neg surgical bx   . BREAST LUMPECTOMY Left 2017   DCIS  . BREAST SURGERY    . CHOLECYSTECTOMY    . COLONOSCOPY WITH PROPOFOL N/A 07/07/2019   Procedure: COLONOSCOPY WITH PROPOFOL;  Surgeon:  Toledo, Benay Pike, MD;  Location: [facility];  Service: Gastroenterology;  Laterality: N/A;  . DIRECT LARYNGOSCOPY N/A 09/22/2020   Procedure: SUSPENSION MICRODIRECT LARYNGOSCOPY WITH BIOPSY;  Surgeon: Carloyn Manner, MD;  Location: [facility];  Service: ENT;  Laterality: N/A;  has port a cath needs accessed  . ESOPHAGOGASTRODUODENOSCOPY (EGD) WITH PROPOFOL N/A 07/07/2019   Procedure: ESOPHAGOGASTRODUODENOSCOPY (EGD) WITH PROPOFOL;  Surgeon: Toledo, Benay Pike, MD;  Location: [facility];  Service: Gastroenterology;  Laterality: N/A;  . PATENT DUCTUS ARTERIOUS REPAIR    . RECONSTRUCTION TENDON PULLEY HAND Left   . TOTAL HIP ARTHROPLASTY Right     There were no vitals filed for this visit.     Subjective Assessment - 12/07/20 1651    Subjective Pt reports no changes in pain from baseline. She continues to report R hip pain in joint. She is thinking of following-up again with doctor regarding hip pain.    Pertinent History Per chart: PMH includes: Lupus, seronegative arthritis, fibromyalgia, osteoporosis, breast cancer(radiation), morbid obesity, DVTs, IBS,COPD, chronic migraine, lymphedema, thrombocytopenia. Seizures, RA, MS? back pain that radiates down into both hips and legs. She has bilateral foot numbness. Toes will turn purple.    Limitations Sitting;Walking  How long can you sit comfortably? Pt says sitting limited to 20 minutes, worsened with spinal extension and improved with flexion.    How long can you stand comfortably? no reported difficulty    How long can you walk comfortably? Pt reports difficulty with prolonged walking    Diagnostic tests MRI done on 05/10/2020 (see physician note)    Patient Stated Goals Pt would like to increae LE strength and decrease pain in order to walk her dog    Currently in Pain? Yes    Pain Location Hip    Pain Orientation Right    Pain Onset More than a month ago          TREATMENT THEREX:  With 3# AWs, at support  bar: Standing hip abduction - 3x15 BLEs; cuing to perform within pain-tolerance.  Standing marches - 2x20 BLEs Standing hip extension - 3x15 BLEs; Brief demo/VC for technique.  Standing heel raises - 2x20 BLEs; brief demo/VC for technique.  STS - 1x10 Staggered STS - 1x6 each LE. Demo/VC for technique. Pt staggers slightly. She rates performing exercise with RLE as primary stance leg as difficult, whereas performing with LLE as stance leg as "medium." She also reports slight increase in R knee pain.  Neuro Re-Education:  At support bar: Split stance rocks - x multiple reps each LE as primary stance leg; more challenging with L LE as primary stance LE.   SLB-  4x30 sec BLEs; intermittent UE support/toe touch.  Tandem stance 4x30 sec BLEs  Education provided throughout in the form of demo, VC to facilitate movement at target joints and correct muscle activation with exercises. Pt demonstrates good carryover within session with cues.   PT Education - 12/07/20 1652    Education Details exercise technique, body mechanics with staggered STS    Person(s) Educated Patient    Methods Explanation;Demonstration;Verbal cues    Comprehension Verbalized understanding;Returned demonstration          Education provided throughout in the form of demo, VC to facilitate movement at target joints and correct muscle activation with exercises. Pt demonstrates good carryover within session with cuing.     PT Short Term Goals - 10/06/20 1513      PT SHORT TERM GOAL #1   Title Patient will be independent in home exercise program to improve strength/mobility for better functional independence with ADLs.    Baseline 08/17/2020 Pt issued HEP; 10/06/2020 Pt reports she is confident with HEP, advanced HEP to be issued future session    Time 6    Period Weeks    Status On-going    Target Date 11/17/20             PT Long Term Goals - 10/06/20 1514      PT LONG TERM GOAL #1   Title Patient will increase  FOTO score to equal to or greater than 60 to demonstrate statistically significant improvement in mobility and quality of life.    Baseline 08/17/2020 Pt score 55 (risk-adjusted 50); 10/06/2020 48    Time 12    Period Weeks    Status On-going    Target Date 12/29/20      PT LONG TERM GOAL #2   Title Pt will increase MMT scores by at least 1/2 point to indicate increased B LE strength for performing functional activities.    Baseline 08/17/2020 MMT L/R hip flexion 3/5, 3/5; hip abduction 3/5, 3+/5; hip adduction 2+/5, 3-/5; knee flexion 4-/5, 3+/5, knee extension 4-/5, 3/5;  ankle 3/5 B; ankle plantarflexion 4/5 B; 10/06/2020 Hip flexion 4/5 B; hip abduction 4+/5 B; hip adduction 3+/5 and pain limited; knee extension 4+/5 B, L knee flexion 4+/5, R knee flexion 5/5; B ankle plantarflexion and dorsiflexion both 5/5 B    Time 8    Period Weeks    Status Achieved      PT LONG TERM GOAL #3   Title Patient will increase 10 meter walk test to >1.15ms as to improve gait speed for better community ambulation and to reduce fall risk.    Baseline 08/17/2020 0.65 m/s no AD; 0.71 m/s with no AD    Time 12    Period Weeks    Status On-going    Target Date 12/29/20      PT LONG TERM GOAL #4   Title Patient (< 662years old) will complete five times sit to stand test in < 10 seconds indicating an increased LE strength and improved balance.    Baseline 08/17/2020 5xSTS completed in 14.98 sec; 10/06/2020 13.81 sec    Time 12    Period Weeks    Status On-going    Target Date 12/29/20      PT LONG TERM GOAL #5   Title Patient will increase Berg Balance score by > 6 points to demonstrate decreased fall risk during functional activities.    Baseline 08/17/2020 Berg score 50/56; 10/06/2020 54/56    Time 12    Period Weeks    Status Partially Met    Target Date 12/29/20                 Plan - 12/07/20 1654    Clinical Impression Statement Pt continues to have slight increase in R hip pain this session (hx of  hip replacement). Her pain increased with staggered STS with RLE as stance leg. Pt says pain feels as if it is in joint and refers to groin. PT and pt discussed follow-up with her doctor due to chronicity of pain and radiating to groin. Pt verbalized understanding. Pt pain remained at baseline level with all other exercises. Pt additionally challeneged with SLB and tandem stance exercises. The pt will benefit from further skilled therapy to improve BLE strength and balance to increase ease with all functional mobility.    Personal Factors and Comorbidities Comorbidity 1;Comorbidity 2;Comorbidity 3+;Fitness    Comorbidities pertinent: hx of thrombosis, chronic fatigue, osteoporisis, HA disorder, polyathralgia, COPD, IBS, Fibromyalgia, lupus    Examination-Activity Limitations Stairs;Transfers;Locomotion Level;Sleep;Squat;Stand;Reach Overhead    Examination-Participation Restrictions Community Activity;Meal Prep;Laundry;Shop;Volunteer;Yard Work;Interpersonal Relationship    Stability/Clinical Decision Making Evolving/Moderate complexity    Rehab Potential Good    PT Frequency 1x / week    PT Duration 12 weeks    PT Treatment/Interventions ADLs/Self Care Home Management;Electrical Stimulation;Moist Heat;DME Instruction;Gait training;Stair training;Cryotherapy;Ultrasound;Functional mobility training;Neuromuscular re-education;Balance training;Therapeutic exercise;Therapeutic activities;Patient/family education;Orthotic Fit/Training;Manual techniques;Passive range of motion;Energy conservation;Taping;Canalith Repostioning;Vestibular;Visual/perceptual remediation/compensation    PT Next Visit Plan advance HEP; continue current POC, balance and cooridnation exercises; progress resistance with exercises; ankle strategies, endurance exercises    PT Home Exercise Plan see HEP (medbridge link)    Consulted and Agree with Plan of Care Patient           Patient will benefit from skilled therapeutic intervention  in order to improve the following deficits and impairments:  Abnormal gait,Decreased activity tolerance,Decreased balance,Decreased endurance,Decreased coordination,Decreased mobility,Decreased strength,Decreased range of motion,Pain,Improper body mechanics,Postural dysfunction,Impaired flexibility,Difficulty walking,Cardiopulmonary status limiting activity,Decreased knowledge of use of DME,Obesity  Visit Diagnosis: Muscle weakness (  generalized)  Unsteadiness on feet  Other abnormalities of gait and mobility     Problem List Patient Active Problem List   Diagnosis Date Noted  . Prediabetes 09/24/2020  . Vitamin D deficiency 08/22/2019  . History of thrombosis 08/22/2019  . Chronic fatigue 07/17/2019  . Osteoporosis 05/12/2019  . STEC (Shiga toxin-producing Escherichia coli) infection 04/06/2019  . Diverticulitis 04/06/2019  . Thrombocytopenia ([place]) 01/23/2019  . Leukopenia 01/23/2019  . Lymphedema of both lower extremities 01/16/2019  . Chronic migraine without aura 11/22/2018  . Polyarthralgia 10/14/2018  . Headache disorder 10/14/2018  . Chronic daily headache 10/11/2018  . COPD (chronic obstructive pulmonary disease) ([place]) 10/11/2018  . Thoracic aortic atherosclerosis ([place]) 10/11/2018  . Multiple episodes of deep venous thrombosis ([place]) 09/17/2018  . IBS (irritable bowel syndrome) 09/17/2018  . Healthcare maintenance 09/17/2018  . Primary osteoarthritis of right hip 09/03/2018  . Presence of right hip implant 09/03/2018  . Morbid obesity with BMI of 45.0-49.9, adult ([place]) 08/23/2018  . Shortness of breath 08/22/2018  . History of ductal carcinoma in situ (DCIS) of breast 07/20/2018  . Aromatase inhibitor use 07/20/2018  . Abnormal findings on auscultation 07/16/2018  . Encounter for long-term (current) use of high-risk medication 07/16/2018  . Fibromyalgia 07/16/2018  . Other forms of systemic lupus erythematosus ( [place]) 07/16/2018  . Other osteoporosis without current  pathological fracture 07/16/2018  . Seronegative arthritis 07/16/2018   Ricard Dillon PT, DPT 12/07/2020, 5:01 PM  [place] MAIN [facility] SERVICES [address] [place], [place], [address] Phone: [phone]   Fax:  [phone]  Name: Emmalyne Giacomo MRN: [id number] Date of Birth: 1960/11/04

## 2020-12-13 ENCOUNTER — Ambulatory Visit: Payer: Medicare Other

## 2020-12-15 ENCOUNTER — Ambulatory Visit (INDEPENDENT_AMBULATORY_CARE_PROVIDER_SITE_OTHER): Payer: Medicare Other | Admitting: Dermatology

## 2020-12-15 ENCOUNTER — Other Ambulatory Visit: Payer: Self-pay

## 2020-12-15 DIAGNOSIS — D485 Neoplasm of uncertain behavior of skin: Secondary | ICD-10-CM

## 2020-12-15 DIAGNOSIS — Z1283 Encounter for screening for malignant neoplasm of skin: Secondary | ICD-10-CM

## 2020-12-15 DIAGNOSIS — L821 Other seborrheic keratosis: Secondary | ICD-10-CM

## 2020-12-15 DIAGNOSIS — L578 Other skin changes due to chronic exposure to nonionizing radiation: Secondary | ICD-10-CM

## 2020-12-15 DIAGNOSIS — D229 Melanocytic nevi, unspecified: Secondary | ICD-10-CM

## 2020-12-15 DIAGNOSIS — L814 Other melanin hyperpigmentation: Secondary | ICD-10-CM | POA: Diagnosis not present

## 2020-12-15 DIAGNOSIS — D225 Melanocytic nevi of trunk: Secondary | ICD-10-CM

## 2020-12-15 DIAGNOSIS — D1800 Hemangioma unspecified site: Secondary | ICD-10-CM

## 2020-12-15 NOTE — Progress Notes (Signed)
New Patient Visit  Subjective  Victoria [name] is a 60 y.o. female who presents for the following: Annual Exam (Dr. Ouida Sills wanted the lesions on her back checked, and the patient has noticed an irregular lesion on her breast that she is concerned about). The patient presents for Total-Body Skin Exam (TBSE) for skin cancer screening and mole check.  The following portions of the chart were reviewed this encounter and updated as appropriate:   Tobacco  Allergies  Meds  Problems  Med Hx  Surg Hx  Fam Hx     Review of Systems:  No other skin or systemic complaints except as noted in HPI or Assessment and Plan.  Objective  Well appearing patient in no apparent distress; mood and affect are within normal limits.  A full examination was performed including scalp, head, eyes, ears, nose, lips, neck, chest, axillae, abdomen, back, buttocks, bilateral upper extremities, bilateral lower extremities, hands, feet, fingers, toes, fingernails, and toenails. All findings within normal limits unless otherwise noted below.  Objective  R inf buttocks: 0.8 cm irregular brown macule   Objective  R calf, back: Slightly irregular brown macules.  Images      Assessment & Plan  Neoplasm of uncertain behavior of skin R inf buttocks Epidermal / dermal shaving  Lesion diameter (cm):  0.8 Informed consent: discussed and consent obtained   Timeout: patient name, date of birth, surgical site, and procedure verified   Procedure prep:  Patient was prepped and draped in usual sterile fashion Prep type:  Isopropyl alcohol Anesthesia: the lesion was anesthetized in a standard fashion   Anesthetic:  1% lidocaine w/ epinephrine 1-100,000 buffered w/ 8.4% NaHCO3 Instrument used: flexible razor blade   Hemostasis achieved with: pressure, aluminum chloride and electrodesiccation   Outcome: patient tolerated procedure well   Post-procedure details: sterile dressing applied and wound care instructions  given   Dressing type: bandage and petrolatum    Specimen 1 - Surgical pathology Differential Diagnosis: D48.5 r/o dysplastic nevus  Check Margins: No 0.8 cm irregular brown macule  Nevus R calf, back Chronic; persistent nevi. Slightly irregular. If today's biopsy is abnormal;  recommend additional mole biopsies. Recheck at follow up appointment.  Skin cancer screening   Lentigines - Scattered tan macules - Due to sun exposure - Benign-appering, observe - Recommend daily broad spectrum sunscreen SPF 30+ to sun-exposed areas, reapply every 2 hours as needed. - Call for any changes  Seborrheic Keratoses - Stuck-on, waxy, tan-brown papules and/or plaques  - Benign-appearing - Discussed benign etiology and prognosis. - Observe - Call for any changes  Melanocytic Nevi - Tan-brown and/or pink-flesh-colored symmetric macules and papules - Benign appearing on exam today - Observation - Call clinic for new or changing moles - Recommend daily use of broad spectrum spf 30+ sunscreen to sun-exposed areas.   Hemangiomas - Red papules - Discussed benign nature - Observe - Call for any changes  Actinic Damage - Chronic condition, secondary to cumulative UV/sun exposure - diffuse scaly erythematous macules with underlying dyspigmentation - Recommend daily broad spectrum sunscreen SPF 30+ to sun-exposed areas, reapply every 2 hours as needed.  - Staying in the shade or wearing long sleeves, sun glasses (UVA+UVB protection) and wide brim hats (4-inch brim around the entire circumference of the hat) are also recommended for sun protection.  - Call for new or changing lesions.  Skin cancer screening performed today.  Return in about 6 months (around 06/17/2021) for TBSE.  Luther Redo, CMA, am  acting as scribe for Sarina Ser, MD .  Documentation: I have reviewed the above documentation for accuracy and completeness, and I agree with the above.  Sarina Ser, MD

## 2020-12-15 NOTE — Patient Instructions (Addendum)
If you have any questions or concerns for your doctor, please call our main line at [phone] and press option 4 to reach your doctor's medical assistant. If no one answers, please leave a voicemail as directed and we will return your call as soon as possible. Messages left after 4 pm will be answered the following business day.   You may also send us a message via MyChart. We typically respond to MyChart messages within 1-2 business days.  For prescription refills, please ask your pharmacy to contact our office. Our fax number is [phone].  If you have an urgent issue when the clinic is closed that cannot wait until the next business day, you can page your doctor at the number below.    Please note that while we do our best to be available for urgent issues outside of office hours, we are not available 24/7.   If you have an urgent issue and are unable to reach us, you may choose to seek medical care at your doctor's office, retail clinic, urgent care center, or emergency room.  If you have a medical emergency, please immediately call 911 or go to the emergency department.  Pager Numbers  - Dr. Kowalski: [phone]  - Dr. Moye: [phone]  - Dr. Stewart: [phone]  In the event of inclement weather, please call our main line at [phone] for an update on the status of any delays or closures.  Dermatology Medication Tips: Please keep the boxes that topical medications come in in order to help keep track of the instructions about where and how to use these. Pharmacies typically print the medication instructions only on the boxes and not directly on the medication tubes.   If your medication is too expensive, please contact our office at [phone] option 4 or send us a message through MyChart.   We are unable to tell what your co-pay for medications will be in advance as this is different depending on your insurance coverage. However, we may be able to find a  substitute medication at lower cost or fill out paperwork to get insurance to cover a needed medication.   If a prior authorization is required to get your medication covered by your insurance company, please allow us 1-2 business days to complete this process.  Drug prices often vary depending on where the prescription is filled and some pharmacies may offer cheaper prices.  The website www.goodrx.com contains coupons for medications through different pharmacies. The prices here do not account for what the cost may be with help from insurance (it may be cheaper with your insurance), but the website can give you the price if you did not use any insurance.  - You can print the associated coupon and take it with your prescription to the pharmacy.  - You may also stop by our office during regular business hours and pick up a GoodRx coupon card.  - If you need your prescription sent electronically to a different pharmacy, notify our office through [facility] MyChart or by phone at [phone] option 4.     Wound Care Instructions  1. Cleanse wound gently with soap and water once a day then pat dry with clean gauze. Apply a thing coat of Petrolatum (petroleum jelly, "Vaseline") over the wound (unless you have an allergy to this). We recommend that you use a new, sterile tube of Vaseline. Do not pick or remove scabs. Do not remove the yellow or white "healing tissue" from the base of the wound.    2. Cover the wound with fresh, clean, nonstick gauze and secure with paper tape. You may use Band-Aids in place of gauze and tape if the would is small enough, but would recommend trimming much of the tape off as there is often too much. Sometimes Band-Aids can irritate the skin.  3. You should call the office for your biopsy report after 1 week if you have not already been contacted.  4. If you experience any problems, such as abnormal amounts of bleeding, swelling, significant bruising, significant pain,  or evidence of infection, please call the office immediately.  5. FOR ADULT SURGERY PATIENTS: If you need something for pain relief you may take 1 extra strength Tylenol (acetaminophen) AND 2 Ibuprofen (200mg each) together every 4 hours as needed for pain. (do not take these if you are allergic to them or if you have a reason you should not take them.) Typically, you may only need pain medication for 1 to 3 days.     

## 2020-12-16 ENCOUNTER — Ambulatory Visit: Payer: Medicare Other

## 2020-12-16 DIAGNOSIS — M79605 Pain in left leg: Secondary | ICD-10-CM

## 2020-12-16 DIAGNOSIS — M6281 Muscle weakness (generalized): Secondary | ICD-10-CM | POA: Diagnosis not present

## 2020-12-16 DIAGNOSIS — M79604 Pain in right leg: Secondary | ICD-10-CM

## 2020-12-16 DIAGNOSIS — M545 Low back pain, unspecified: Secondary | ICD-10-CM

## 2020-12-16 DIAGNOSIS — R2689 Other abnormalities of gait and mobility: Secondary | ICD-10-CM

## 2020-12-16 NOTE — Therapy (Signed)
[place] MAIN [facility] SERVICES [address] [place], [place], [address] Phone: [phone]   Fax:  [phone]  Physical Therapy Treatment  Patient Details  Name: Victoria [name] MRN: [id number] Date of Birth: 02/19/1961 Referring Provider (PT): Vladimir Crofts, MD   Encounter Date: 12/16/2020   PT End of Session - 12/16/20 1529    Visit Number 17    Number of Visits 29    Date for PT Re-Evaluation 12/29/20    Authorization Type Prog note and recert performed 09/11/8525    PT Start Time 1440    PT Stop Time 1519    PT Time Calculation (min) 39 min    Equipment Utilized During Treatment Gait belt    Activity Tolerance Patient tolerated treatment well    Behavior During Therapy [facility] for tasks assessed/performed           Past Medical History:  Diagnosis Date  . Allergy   . Asthma   . Cancer ([place])    Breast Left, DCIS 2018  . Collagen vascular disease (HCC)    Lupus  . COPD (chronic obstructive pulmonary disease) ([place])   . Diverticulitis   . Fibromyalgia   . GERD (gastroesophageal reflux disease)   . Headache    Migraines  . Hearing impaired person, left    bilateral hearing aids  . IBS (irritable bowel syndrome)   . Lupus ([place])   . Mitral valve disorder   . MS (multiple sclerosis) ([place])   . Neuromuscular disorder ([place])   . Neuropathy   . Osteoarthritis   . Osteoporosis 05/12/2019  . Personal history of radiation therapy 2017   left   . Rheumatoid arthritis (HCC)    hands and legs  . Seizures ([place])    no seizures for 10 years    Past Surgical History:  Procedure Laterality Date  . ABDOMINAL HYSTERECTOMY    . APPENDECTOMY    . BREAST EXCISIONAL BIOPSY Left 2017   lumpectomy with radation  . BREAST EXCISIONAL BIOPSY Left 2004   neg surgical bx   . BREAST LUMPECTOMY Left 2017   DCIS  . BREAST SURGERY    . CHOLECYSTECTOMY    . COLONOSCOPY WITH PROPOFOL N/A 07/07/2019   Procedure: COLONOSCOPY WITH PROPOFOL;  Surgeon:  Toledo, Benay Pike, MD;  Location: [facility];  Service: Gastroenterology;  Laterality: N/A;  . DIRECT LARYNGOSCOPY N/A 09/22/2020   Procedure: SUSPENSION MICRODIRECT LARYNGOSCOPY WITH BIOPSY;  Surgeon: Carloyn Manner, MD;  Location: [place];  Service: ENT;  Laterality: N/A;  has port a cath needs accessed  . ESOPHAGOGASTRODUODENOSCOPY (EGD) WITH PROPOFOL N/A 07/07/2019   Procedure: ESOPHAGOGASTRODUODENOSCOPY (EGD) WITH PROPOFOL;  Surgeon: Toledo, Benay Pike, MD;  Location: [facility];  Service: Gastroenterology;  Laterality: N/A;  . PATENT DUCTUS ARTERIOUS REPAIR    . RECONSTRUCTION TENDON PULLEY HAND Left   . TOTAL HIP ARTHROPLASTY Right     There were no vitals filed for this visit.   Subjective Assessment - 12/16/20 1525    Subjective Pt presents to PT using SPC. Pt says fibro affecting her arms and legs and that her pain has been worse lately. She rates back pain as 8/10. She says her balance has been off as well.    Pertinent History Per chart: PMH includes: Lupus, seronegative arthritis, fibromyalgia, osteoporosis, breast cancer(radiation), morbid obesity, DVTs, IBS,COPD, chronic migraine, lymphedema, thrombocytopenia. Seizures, RA, MS? back pain that radiates down into both hips and legs. She has bilateral foot numbness. Toes will  turn purple.    Limitations Sitting;Walking    How long can you sit comfortably? Pt says sitting limited to 20 minutes, worsened with spinal extension and improved with flexion.    How long can you stand comfortably? no reported difficulty    How long can you walk comfortably? Pt reports difficulty with prolonged walking    Diagnostic tests MRI done on 05/10/2020 (see physician note)    Patient Stated Goals Pt would like to increae LE strength and decrease pain in order to walk her dog    Currently in Pain? Yes    Pain Score 8     Pain Location Back    Pain Orientation Lower    Pain Descriptors / Indicators Aching;Sharp;Shooting    Pain  Onset More than a month ago           TREATMENT  Therex:  Hot pack placed to low back musculature x 25 min while pt performed exercises. Pt reports heat feeling good. Once hot pack removed skin checked with no adverse reaction to treatment observed or reported.  Supine: Education provided regarding purpose, technique with pelvic tilts, TA activation. Pelvic tilts - 5x5 second hold TA activation - 10x5 sec hold TA activation with posterior pelvic tilt - 5 x 5 second hold TA activation, posterior pelvic tilt with marching (intro to deadbug) - 2x2 with 4 second hold, 1x8 each LE. Cuing to perform within pain-free range. TA activation, posterior pelvic tilt with adductor squeezes with ball - 3x10. Pt rates "medium" difficulty. Cuing to perform within pain-free range. Glute bridges 1x5; pt reports increase in back pain with exercise so exercise discontinued. Supine glute squeezes - 3x10 PT-assisted knee-to-chest 6x each LE; limited ROM B in order to stay within pain-free threshold. Pt more limited on RLE than LLE due to pain.  PT-assisted supine hamstring stretch - 30 sec BLEs. Limited ROM B, within pain-free range. PT-assisted sciatic nerve floss - 10x each LE. Lower trunk rotation - 10x each direction; pt reports increased back pain so discontinued  Seated physioball rollouts forward/backward 2x10 - no pain with exercise.    Education provided throughout in the form of demo, VC/TC to facilitate movement at target joints and correct muscle activation with exercises.Pt demonstrates good carryover within session with cues     PT Education - 12/16/20 1528    Education Details exercise technique, body mechanics with TA activation, pelvic tilts    Person(s) Educated Patient    Methods Explanation;Demonstration;Verbal cues    Comprehension Verbalized understanding;Returned demonstration            PT Short Term Goals - 10/06/20 1513      PT SHORT TERM GOAL #1   Title Patient will  be independent in home exercise program to improve strength/mobility for better functional independence with ADLs.    Baseline 08/17/2020 Pt issued HEP; 10/06/2020 Pt reports she is confident with HEP, advanced HEP to be issued future session    Time 6    Period Weeks    Status On-going    Target Date 11/17/20             PT Long Term Goals - 10/06/20 1514      PT LONG TERM GOAL #1   Title Patient will increase FOTO score to equal to or greater than 60 to demonstrate statistically significant improvement in mobility and quality of life.    Baseline 08/17/2020 Pt score 55 (risk-adjusted 50); 10/06/2020 48    Time 12  Period Weeks    Status On-going    Target Date 12/29/20      PT LONG TERM GOAL #2   Title Pt will increase MMT scores by at least 1/2 point to indicate increased B LE strength for performing functional activities.    Baseline 08/17/2020 MMT L/R hip flexion 3/5, 3/5; hip abduction 3/5, 3+/5; hip adduction 2+/5, 3-/5; knee flexion 4-/5, 3+/5, knee extension 4-/5, 3/5; ankle 3/5 B; ankle plantarflexion 4/5 B; 10/06/2020 Hip flexion 4/5 B; hip abduction 4+/5 B; hip adduction 3+/5 and pain limited; knee extension 4+/5 B, L knee flexion 4+/5, R knee flexion 5/5; B ankle plantarflexion and dorsiflexion both 5/5 B    Time 8    Period Weeks    Status Achieved      PT LONG TERM GOAL #3   Title Patient will increase 10 meter walk test to >1.71ms as to improve gait speed for better community ambulation and to reduce fall risk.    Baseline 08/17/2020 0.65 m/s no AD; 0.71 m/s with no AD    Time 12    Period Weeks    Status On-going    Target Date 12/29/20      PT LONG TERM GOAL #4   Title Patient (< 673years old) will complete five times sit to stand test in < 10 seconds indicating an increased LE strength and improved balance.    Baseline 08/17/2020 5xSTS completed in 14.98 sec; 10/06/2020 13.81 sec    Time 12    Period Weeks    Status On-going    Target Date 12/29/20      PT LONG  TERM GOAL #5   Title Patient will increase Berg Balance score by > 6 points to demonstrate decreased fall risk during functional activities.    Baseline 08/17/2020 Berg score 50/56; 10/06/2020 54/56    Time 12    Period Weeks    Status Partially Met    Target Date 12/29/20                 Plan - 12/16/20 1529    Clinical Impression Statement PT session focused on supine therex, pain modulatory techniques due to pt reports of increased back pain/leg pain. Pt very pain limited with majority of exercises. Best response was to spinal flexion-based phsyio ball rollouts, where pt maintained pain at baseline. When attempting hamstring stretches she had greater limitation in range on RLE compared to LLE. The pt will benefit from further skilled therapy to further address impairments in gait, pain, balance and strength in order to increase ease with all functional mobility.    Personal Factors and Comorbidities Comorbidity 1;Comorbidity 2;Comorbidity 3+;Fitness    Comorbidities pertinent: hx of thrombosis, chronic fatigue, osteoporisis, HA disorder, polyathralgia, COPD, IBS, Fibromyalgia, lupus    Examination-Activity Limitations Stairs;Transfers;Locomotion Level;Sleep;Squat;Stand;Reach Overhead    Examination-Participation Restrictions Community Activity;Meal Prep;Laundry;Shop;Volunteer;Yard Work;Interpersonal Relationship    Stability/Clinical Decision Making Evolving/Moderate complexity    Rehab Potential Good    PT Frequency 1x / week    PT Duration 12 weeks    PT Treatment/Interventions ADLs/Self Care Home Management;Electrical Stimulation;Moist Heat;DME Instruction;Gait training;Stair training;Cryotherapy;Ultrasound;Functional mobility training;Neuromuscular re-education;Balance training;Therapeutic exercise;Therapeutic activities;Patient/family education;Orthotic Fit/Training;Manual techniques;Passive range of motion;Energy conservation;Taping;Canalith Repostioning;Vestibular;Visual/perceptual  remediation/compensation    PT Next Visit Plan advance HEP; continue current POC, balance and cooridnation exercises; progress resistance with exercises; ankle strategies, endurance exercises, pain-modulation techniques    PT Home Exercise Plan see HEP (medbridge link)    Consulted and Agree with Plan of Care Patient  Patient will benefit from skilled therapeutic intervention in order to improve the following deficits and impairments:  Abnormal gait,Decreased activity tolerance,Decreased balance,Decreased endurance,Decreased coordination,Decreased mobility,Decreased strength,Decreased range of motion,Pain,Improper body mechanics,Postural dysfunction,Impaired flexibility,Difficulty walking,Cardiopulmonary status limiting activity,Decreased knowledge of use of DME,Obesity  Visit Diagnosis: Other abnormalities of gait and mobility  Muscle weakness (generalized)  Chronic bilateral low back pain, unspecified whether sciatica present  Pain in left leg  Pain in right leg     Problem List Patient Active Problem List   Diagnosis Date Noted  . Prediabetes 09/24/2020  . Vitamin D deficiency 08/22/2019  . History of thrombosis 08/22/2019  . Chronic fatigue 07/17/2019  . Osteoporosis 05/12/2019  . STEC (Shiga toxin-producing Escherichia coli) infection 04/06/2019  . Diverticulitis 04/06/2019  . Thrombocytopenia ([place]) 01/23/2019  . Leukopenia 01/23/2019  . Lymphedema of both lower extremities 01/16/2019  . Chronic migraine without aura 11/22/2018  . Polyarthralgia 10/14/2018  . Headache disorder 10/14/2018  . Chronic daily headache 10/11/2018  . COPD (chronic obstructive pulmonary disease) ([place]) 10/11/2018  . Thoracic aortic atherosclerosis ([place]) 10/11/2018  . Multiple episodes of deep venous thrombosis ([place]) 09/17/2018  . IBS (irritable bowel syndrome) 09/17/2018  . Healthcare maintenance 09/17/2018  . Primary osteoarthritis of right hip 09/03/2018  . Presence of right hip  implant 09/03/2018  . Morbid obesity with BMI of 45.0-49.9, adult ([place]) 08/23/2018  . Shortness of breath 08/22/2018  . History of ductal carcinoma in situ (DCIS) of breast 07/20/2018  . Aromatase inhibitor use 07/20/2018  . Abnormal findings on auscultation 07/16/2018  . Encounter for long-term (current) use of high-risk medication 07/16/2018  . Fibromyalgia 07/16/2018  . Other forms of systemic lupus erythematosus ([place]) 07/16/2018  . Other osteoporosis without current pathological fracture 07/16/2018  . Seronegative arthritis 07/16/2018   Ricard Dillon PT, DPT 12/16/2020, 3:50 PM  [place] MAIN [facility] SERVICES [address] [place], [place], [address] Phone: [phone]   Fax:  [phone]  Name: Victoria [name] MRN: [id number] Date of Birth: 1960-09-14

## 2020-12-21 ENCOUNTER — Encounter: Payer: Self-pay | Admitting: Dermatology

## 2020-12-21 ENCOUNTER — Ambulatory Visit
Payer: Medicare Other | Attending: Student in an Organized Health Care Education/Training Program | Admitting: Student in an Organized Health Care Education/Training Program

## 2020-12-21 ENCOUNTER — Other Ambulatory Visit: Payer: Self-pay

## 2020-12-21 ENCOUNTER — Ambulatory Visit: Payer: Medicare Other

## 2020-12-21 ENCOUNTER — Encounter: Payer: Self-pay | Admitting: Student in an Organized Health Care Education/Training Program

## 2020-12-21 VITALS — BP 133/68 | HR 75 | Temp 97.3°F | Resp 15 | Ht <= 58 in | Wt 188.0 lb

## 2020-12-21 DIAGNOSIS — M6281 Muscle weakness (generalized): Secondary | ICD-10-CM | POA: Diagnosis not present

## 2020-12-21 DIAGNOSIS — M2550 Pain in unspecified joint: Secondary | ICD-10-CM | POA: Diagnosis present

## 2020-12-21 DIAGNOSIS — M1611 Unilateral primary osteoarthritis, right hip: Secondary | ICD-10-CM | POA: Diagnosis not present

## 2020-12-21 DIAGNOSIS — G894 Chronic pain syndrome: Secondary | ICD-10-CM | POA: Insufficient documentation

## 2020-12-21 DIAGNOSIS — M5416 Radiculopathy, lumbar region: Secondary | ICD-10-CM | POA: Insufficient documentation

## 2020-12-21 DIAGNOSIS — R2689 Other abnormalities of gait and mobility: Secondary | ICD-10-CM

## 2020-12-21 DIAGNOSIS — R2681 Unsteadiness on feet: Secondary | ICD-10-CM

## 2020-12-21 DIAGNOSIS — M1380 Other specified arthritis, unspecified site: Secondary | ICD-10-CM | POA: Diagnosis not present

## 2020-12-21 DIAGNOSIS — M797 Fibromyalgia: Secondary | ICD-10-CM | POA: Insufficient documentation

## 2020-12-21 MED ORDER — HYDROCODONE-ACETAMINOPHEN 7.5-325 MG PO TABS: 1.0000 | ORAL_TABLET | Freq: Three times a day (TID) | ORAL | 0 refills | Status: AC | PRN

## 2020-12-21 MED ORDER — GABAPENTIN 300 MG PO CAPS: 300.0000 mg | ORAL_CAPSULE | Freq: Three times a day (TID) | ORAL | 0 refills | Status: DC

## 2020-12-21 NOTE — Therapy (Signed)
[place] MAIN [facility] SERVICES [address] [place], [place], [address] Phone: [phone]   Fax:  [phone]  Physical Therapy Treatment  Patient Details  Name: Victoria [name] MRN: [id number] Date of Birth: 1960/09/23 Referring Provider (PT): Vladimir Crofts, MD   Encounter Date: 12/21/2020   PT End of Session - 12/21/20 2048    Visit Number 18    Number of Visits 29    Date for PT Re-Evaluation 12/29/20    Authorization Type Prog note and recert performed 03/10/4195    PT Start Time 1310    PT Stop Time 1343    PT Time Calculation (min) 33 min    Equipment Utilized During Treatment Gait belt    Activity Tolerance Patient tolerated treatment well    Behavior During Therapy [facility] for tasks assessed/performed           Past Medical History:  Diagnosis Date  . Allergy   . Asthma   . Cancer ([place])    Breast Left, DCIS 2018  . Collagen vascular disease (HCC)    Lupus  . COPD (chronic obstructive pulmonary disease) ([place])   . Diverticulitis   . Fibromyalgia   . GERD (gastroesophageal reflux disease)   . Headache    Migraines  . Hearing impaired person, left    bilateral hearing aids  . IBS (irritable bowel syndrome)   . Lupus ([place])   . Mitral valve disorder   . MS (multiple sclerosis) ([place])   . Neuromuscular disorder ([place])   . Neuropathy   . Osteoarthritis   . Osteoporosis 05/12/2019  . Personal history of radiation therapy 2017   left   . Rheumatoid arthritis (HCC)    hands and legs  . Seizures ([place])    no seizures for 10 years    Past Surgical History:  Procedure Laterality Date  . ABDOMINAL HYSTERECTOMY    . APPENDECTOMY    . BREAST EXCISIONAL BIOPSY Left 2017   lumpectomy with radation  . BREAST EXCISIONAL BIOPSY Left 2004   neg surgical bx   . BREAST LUMPECTOMY Left 2017   DCIS  . BREAST SURGERY    . CHOLECYSTECTOMY    . COLONOSCOPY WITH PROPOFOL N/A 07/07/2019   Procedure: COLONOSCOPY WITH PROPOFOL;  Surgeon:  Toledo, Benay Pike, MD;  Location: [facility] ENDOSCOPY;  Service: Gastroenterology;  Laterality: N/A;  . DIRECT LARYNGOSCOPY N/A 09/22/2020   Procedure: SUSPENSION MICRODIRECT LARYNGOSCOPY WITH BIOPSY;  Surgeon: Carloyn Manner, MD;  Location: [place];  Service: ENT;  Laterality: N/A;  has port a cath needs accessed  . ESOPHAGOGASTRODUODENOSCOPY (EGD) WITH PROPOFOL N/A 07/07/2019   Procedure: ESOPHAGOGASTRODUODENOSCOPY (EGD) WITH PROPOFOL;  Surgeon: Toledo, Benay Pike, MD;  Location: [facility] ENDOSCOPY;  Service: Gastroenterology;  Laterality: N/A;  . PATENT DUCTUS ARTERIOUS REPAIR    . RECONSTRUCTION TENDON PULLEY HAND Left   . TOTAL HIP ARTHROPLASTY Right     There were no vitals filed for this visit.   Subjective Assessment - 12/21/20 2107    Subjective Pt would like to reassess goals today as she reports feeling independent with exercises performed in PT. She reports she wants to start lymphedema appointments.    Pertinent History Per chart: PMH includes: Lupus, seronegative arthritis, fibromyalgia, osteoporosis, breast cancer(radiation), morbid obesity, DVTs, IBS,COPD, chronic migraine, lymphedema, thrombocytopenia. Seizures, RA, MS? back pain that radiates down into both hips and legs. She has bilateral foot numbness. Toes will turn purple.    Limitations Sitting;Walking    How long  can you sit comfortably? Pt says sitting limited to 20 minutes, worsened with spinal extension and improved with flexion.    How long can you stand comfortably? no reported difficulty    How long can you walk comfortably? Pt reports difficulty with prolonged walking    Diagnostic tests MRI done on 05/10/2020 (see physician note)    Patient Stated Goals Pt would like to increae LE strength and decrease pain in order to walk her dog    Currently in Pain? Yes    Pain Onset More than a month ago         TREATMENT  FOTO: 45   MMT: hip flexors 4/5 B, hip ab 5/5 B,  hip ad 4/5, knee ext 5/5 B, knee flex 4+/5  B, ankle df and pf both 5/5 B  10MWT: 0.85 m/s without an AD    5xSTS: 11.9 seconds (partially met)  Berg: 54/56   Access Code: [email or web address] URL: [email or web address] Date: 12/21/2020 Prepared by: Ricard Dillon  Exercises Single Leg Stance with Support - 1 x daily - 7 x weekly - 2 sets - 2 reps - 30 hold Sit to Stand - 1 x daily - 4 x weekly - 3 sets - 10 reps Standing Hip Abduction with Counter Support - 1 x daily - 4 x weekly - 3 sets - 10 reps Seated Hip Adduction Isometrics with Ball - 1 x daily - 4 x weekly - 3 sets - 10 reps    Education provided throughout session primarily in the form of VC/Demo to facilitate improved technique with muscle activation and movement at target joints with testing and exercise. Pt exhibits good carryover within session after cuing.         PT Education - 12/21/20 2047    Education Details Education on findings of retesting, POC, HEP    Person(s) Educated Patient    Methods Explanation;Handout    Comprehension Verbalized understanding            PT Short Term Goals - 12/21/20 2049      PT SHORT TERM GOAL #1   Title Patient will be independent in home exercise program to improve strength/mobility for better functional independence with ADLs.    Baseline 08/17/2020 Pt issued HEP; 10/06/2020 Pt reports she is confident with HEP, advanced HEP to be issued future session; 5/17: Pt demos and verbalizes independence with HEP    Time 6    Period Weeks    Status Achieved    Target Date 11/17/20             PT Long Term Goals - 12/21/20 2050      PT LONG TERM GOAL #1   Title Patient will increase FOTO score to equal to or greater than 60 to demonstrate statistically significant improvement in mobility and quality of life.    Baseline 08/17/2020 Pt score 55 (risk-adjusted 50); 10/06/2020 48; 5/17: 45    Time 12    Period Weeks    Status On-going    Target Date 12/29/20      PT LONG TERM GOAL #2   Title Pt will increase MMT  scores by at least 1/2 point to indicate increased B LE strength for performing functional activities.    Baseline 08/17/2020 MMT L/R hip flexion 3/5, 3/5; hip abduction 3/5, 3+/5; hip adduction 2+/5, 3-/5; knee flexion 4-/5, 3+/5, knee extension 4-/5, 3/5; ankle 3/5 B; ankle plantarflexion 4/5 B; 10/06/2020 Hip flexion 4/5 B; hip abduction 4+/5 B; hip adduction  3+/5 and pain limited; knee extension 4+/5 B, L knee flexion 4+/5, R knee flexion 5/5; B ankle plantarflexion and dorsiflexion both 5/5 B; 5/17:hip flexors 4/5 B, hip ab 5/5 B, hip ad 4/5 B, knee ext 5/5 B, knee flex 4+/5 B, ankle df and pf 5/5 B    Time 8    Period Weeks    Status Achieved      PT LONG TERM GOAL #3   Title Patient will increase 10 meter walk test to >1.70ms as to improve gait speed for better community ambulation and to reduce fall risk.    Baseline 08/17/2020 0.65 m/s no AD; 0.71 m/s with no AD; 5/17: 0.85 m/s no AD    Time 12    Period Weeks    Status On-going    Target Date 12/29/20      PT LONG TERM GOAL #4   Title Patient (< 632years old) will complete five times sit to stand test in < 10 seconds indicating an increased LE strength and improved balance.    Baseline 08/17/2020 5xSTS completed in 14.98 sec; 10/06/2020 13.81 sec; 12/21/2020 11.9 sec    Time 12    Period Weeks    Status Partially Met    Target Date 12/29/20      PT LONG TERM GOAL #5   Title Patient will increase Berg Balance score by > 6 points to demonstrate decreased fall risk during functional activities.    Baseline 08/17/2020 Berg score 50/56; 10/06/2020 54/56; 5/17:Merrilee Jansky54/56    Time 12    Period Weeks    Status Partially Met    Target Date 12/29/20                 Plan - 12/21/20 2059    Clinical Impression Statement Pt goals reassessed this session. Pt demos gains by partially meeting 5xSTS goal and increasing gait speed on 10MWT. Pt did not see change in Berg score and saw slight decrease in FOTO score. She maintained and saw some  increases on MMT. PT and pt reviewed HEP and pt is indep with HEP. She reports she feels confident in her ability to maintaing gains made in therapy and to increase gains independently outside of PT. The pt would benefit from a follow-up appointment prior to discharge from PT to further advance HEP in order for pt to continue to improve BLE strength and balance to increase QOL.    Personal Factors and Comorbidities Comorbidity 1;Comorbidity 2;Comorbidity 3+;Fitness    Comorbidities pertinent: hx of thrombosis, chronic fatigue, osteoporisis, HA disorder, polyathralgia, COPD, IBS, Fibromyalgia, lupus    Examination-Activity Limitations Stairs;Transfers;Locomotion Level;Sleep;Squat;Stand;Reach Overhead    Examination-Participation Restrictions Community Activity;Meal Prep;Laundry;Shop;Volunteer;Yard Work;Interpersonal Relationship    Stability/Clinical Decision Making Evolving/Moderate complexity    Rehab Potential Good    PT Frequency 1x / week    PT Duration 12 weeks    PT Treatment/Interventions ADLs/Self Care Home Management;Electrical Stimulation;Moist Heat;DME Instruction;Gait training;Stair training;Cryotherapy;Ultrasound;Functional mobility training;Neuromuscular re-education;Balance training;Therapeutic exercise;Therapeutic activities;Patient/family education;Orthotic Fit/Training;Manual techniques;Passive range of motion;Energy conservation;Taping;Canalith Repostioning;Vestibular;Visual/perceptual remediation/compensation    PT Next Visit Plan advance HEP; continue current POC, balance and cooridnation exercises; progress resistance with exercises; ankle strategies, endurance exercises, pain-modulation techniques    PT Home Exercise Plan see HEP (medbridge link)    Consulted and Agree with Plan of Care Patient           Patient will benefit from skilled therapeutic intervention in order to improve the following deficits and impairments:  Abnormal gait,Decreased activity  tolerance,Decreased  balance,Decreased endurance,Decreased coordination,Decreased mobility,Decreased strength,Decreased range of motion,Pain,Improper body mechanics,Postural dysfunction,Impaired flexibility,Difficulty walking,Cardiopulmonary status limiting activity,Decreased knowledge of use of DME,Obesity  Visit Diagnosis: Muscle weakness (generalized)  Unsteadiness on feet  Other abnormalities of gait and mobility     Problem List Patient Active Problem List   Diagnosis Date Noted  . Prediabetes 09/24/2020  . Vitamin D deficiency 08/22/2019  . History of thrombosis 08/22/2019  . Chronic fatigue 07/17/2019  . Osteoporosis 05/12/2019  . STEC (Shiga toxin-producing Escherichia coli) infection 04/06/2019  . Diverticulitis 04/06/2019  . Thrombocytopenia ([place]) 01/23/2019  . Leukopenia 01/23/2019  . Lymphedema of both lower extremities 01/16/2019  . Chronic migraine without aura 11/22/2018  . Polyarthralgia 10/14/2018  . Headache disorder 10/14/2018  . Chronic daily headache 10/11/2018  . COPD (chronic obstructive pulmonary disease) ([place]) 10/11/2018  . Thoracic aortic atherosclerosis ([place]) 10/11/2018  . Multiple episodes of deep venous thrombosis ([place]) 09/17/2018  . IBS (irritable bowel syndrome) 09/17/2018  . Healthcare maintenance 09/17/2018  . Primary osteoarthritis of right hip 09/03/2018  . Presence of right hip implant 09/03/2018  . Morbid obesity with BMI of 45.0-49.9, adult ([place]) 08/23/2018  . Shortness of breath 08/22/2018  . History of ductal carcinoma in situ (DCIS) of breast 07/20/2018  . Aromatase inhibitor use 07/20/2018  . Abnormal findings on auscultation 07/16/2018  . Encounter for long-term (current) use of high-risk medication 07/16/2018  . Fibromyalgia 07/16/2018  . Other forms of systemic lupus erythematosus ([place]) 07/16/2018  . Other osteoporosis without current pathological fracture 07/16/2018  . Seronegative arthritis 07/16/2018   Ricard Dillon PT, DPT  12/21/2020, 9:10  PM  [place] MAIN [facility] SERVICES [address] [place], [place], [address] Phone: [phone]   Fax:  [phone]  Name: Victoria [name] MRN: [id number] Date of Birth: Dec 11, 1960

## 2020-12-21 NOTE — Progress Notes (Signed)
PROVIDER NOTE: Information contained herein reflects review and annotations entered in association with encounter. Interpretation of such information and data should be left to medically-trained personnel. Information provided to patient can be located elsewhere in the medical record under "Patient Instructions". Document created using STT-dictation technology, any transcriptional errors that may result from process are unintentional.    Patient: Victoria [name]  Service Category: E/M  Provider: Gillis Santa, MD  DOB: 03-16-1961  DOS: 12/21/2020  Specialty: Interventional Pain Management  MRN: [id number]  Setting: Ambulatory outpatient  PCP: Kirk Ruths, MD  Type: Established Patient    Referring Provider: Kirk Ruths, MD  Location: Office  Delivery: Face-to-face     HPI  Victoria [name], a 60 y.o. year old female, is here today because of her Chronic pain syndrome [G89.4]. Victoria [name] primary complain today is Leg Pain (right) Last encounter: My last encounter with her was on 11/02/2020. Pertinent problems: Victoria [name] has COPD (chronic obstructive pulmonary disease) ([place]); Polyarthralgia; Morbid obesity with BMI of 45.0-49.9, adult ([place]); Osteoporosis; Chronic fatigue; and History of thrombosis on their pertinent problem list. Pain Assessment: Severity of Chronic pain is reported as a 9 /10. Location: Leg Right/radiates from neck all the way down spine. Onset: More than a month ago. Quality: Aching,Sharp,Shooting. Timing: Constant. Modifying factor(s): medications. Vitals:  height is 4' 7"  (1.397 m) and weight is 188 lb (85.3 kg). Her temporal temperature is 97.3 F (36.3 C) (abnormal). Her blood pressure is 133/68 and her pulse is 75. Her respiration is 15 and oxygen saturation is 100%.   Reason for encounter: medication management.   No change in medical history since last visit.  Patient's pain is at baseline.  Patient continues multimodal pain regimen as prescribed.  States  that it provides pain relief and improvement in functional status. Discussed increasing Gabapentin to 300 mg TID from 200 mg TID.  Pharmacotherapy Assessment   Analgesic: Norco 7.5 mg twice daily as needed, quantity 60/month; MME equals 15    Monitoring: [place] PMP: PDMP reviewed during this encounter.       Pharmacotherapy: No side-effects or adverse reactions reported. Compliance: No problems identified. Effectiveness: Clinically acceptable.  Dewayne Shorter, RN  12/21/2020 11:03 AM  Signed Nursing Pain Medication Assessment:  Safety precautions to be maintained throughout the outpatient stay will include: orient to surroundings, keep bed in low position, maintain call bell within reach at all times, provide assistance with transfer out of bed and ambulation.  Medication Inspection Compliance: Pill count conducted under aseptic conditions, in front of the patient. Neither the pills nor the bottle was removed from the patient's sight at any time. Once count was completed pills were immediately returned to the patient in their original bottle.  Medication: Hydrocodone/APAP Pill/Patch Count: 54 of 90 pills remain Pill/Patch Appearance: Markings consistent with prescribed medication Bottle Appearance: Standard pharmacy container. Clearly labeled. Filled Date: 05 / 04/ 2022 Last Medication intake:  Today    UDS:  Summary  Date Value Ref Range Status  11/02/2020 Note  Final    Comment:    ==================================================================== [employer] (MW) ==================================================================== Test                             Result       Flag       Units  Drug Present and Declared for Prescription Verification   Hydrocodone  4211         EXPECTED   ng/mg creat   Hydromorphone                  177          EXPECTED   ng/mg creat   Dihydrocodeine                 679          EXPECTED   ng/mg creat   Norhydrocodone                  >3448        EXPECTED   ng/mg creat    Sources of hydrocodone include scheduled prescription medications.    Hydromorphone, dihydrocodeine and norhydrocodone are expected    metabolites of hydrocodone. Hydromorphone and dihydrocodeine are    also available as scheduled prescription medications.  ==================================================================== Test                      Result    Flag   Units      Ref Range   Creatinine              145              mg/dL      >=20 ==================================================================== Declared Medications:  The flagging and interpretation on this report are based on the  following declared medications.  Unexpected results may arise from  inaccuracies in the declared medications.   **Note: The testing scope of this panel includes these medications:   Hydrocodone (Norco)   **Note: The testing scope of this panel does not include the  following reported medications:   Acetaminophen (Norco)  Albuterol  Albuterol (Combivent)  Atorvastatin (Lipitor)  Azelastine (Astelin)  Budesonide (Symbicort)  Cyclobenzaprine (Flexeril)  Dabigatran (Pradaxa)  Fluticasone (Flonase)  Folic Acid  Formoterol (Symbicort)  Gabapentin (Neurontin)  Hydroxychloroquine (Plaquenil)  Ipratropium (Combivent)  Letrozole (Femara)  Methotrexate  Omeprazole (Prilosec)  Ondansetron (Zofran)  Probiotic  Tiotropium  Topiramate (Topamax)  Trazodone (Desyrel)  Vitamin C  Vitamin D3 ==================================================================== For clinical consultation, please call [phone]. ====================================================================      ROS  Constitutional: Denies any fever or chills Gastrointestinal: No reported hemesis, hematochezia, vomiting, or acute GI distress Musculoskeletal: Denies any acute onset joint swelling, redness, loss of ROM, or weakness Neurological: No reported  episodes of acute onset apraxia, aphasia, dysarthria, agnosia, amnesia, paralysis, loss of coordination, or loss of consciousness  Medication Review  HYDROcodone-acetaminophen, Ipratropium-Albuterol, Probiotic-10, Tiotropium Bromide Monohydrate, atorvastatin, azelastine, benzonatate, budesonide-formoterol, cholecalciferol, cyclobenzaprine, dabigatran, dicyclomine, famotidine, fluticasone, folic acid, gabapentin, hydroxychloroquine, letrozole, methotrexate, omeprazole, ondansetron, topiramate, traZODone, and vitamin C  History Review  Allergy: Victoria [name] is allergic to other, sodium hypochlorite, tape, codeine, flagyl [metronidazole], levofloxacin, and sulfa antibiotics. Drug: Victoria [name]  reports no history of drug use. Alcohol:  reports current alcohol use. Tobacco:  reports that she quit smoking about 21 years ago. Her smoking use included cigarettes. She has a 30.00 pack-year smoking history. She has never used smokeless tobacco. Social: Victoria [name]  reports that she quit smoking about 21 years ago. Her smoking use included cigarettes. She has a 30.00 pack-year smoking history. She has never used smokeless tobacco. She reports current alcohol use. She reports that she does not use drugs. Medical:  has a past medical history of Allergy, Asthma, Cancer ([place]), Collagen vascular disease ([place]), COPD (chronic obstructive pulmonary disease) ([place]), Diverticulitis, Fibromyalgia, GERD (gastroesophageal reflux disease),  Headache, Hearing impaired person, left, IBS (irritable bowel syndrome), Lupus ([place]), Mitral valve disorder, MS (multiple sclerosis) ([place]), Neuromuscular disorder ([place]), Neuropathy, Osteoarthritis, Osteoporosis (05/12/2019), Personal history of radiation therapy (2017), Rheumatoid arthritis ([place]), and Seizures ([place]). Surgical: Victoria [name]  has a past surgical history that includes Breast surgery; Abdominal hysterectomy; Appendectomy; Cholecystectomy; Patent ductus arterious repair; Total hip  arthroplasty (Right); Reconstruction tendon pulley hand (Left); Esophagogastroduodenoscopy (egd) with propofol (N/A, 07/07/2019); Colonoscopy with propofol (N/A, 07/07/2019); Breast lumpectomy (Left, 2017); Breast excisional biopsy (Left, 2017); Breast excisional biopsy (Left, 2004); and Direct laryngoscopy (N/A, 09/22/2020). Family: family history includes Arthritis in her brother; COPD in her mother; Diabetes in her brother; Stroke in her brother and father.  Laboratory Chemistry Profile   Renal Lab Results  Component Value Date   BUN 9 10/11/2020   CREATININE 0.69 10/11/2020   GFRAA >60 02/17/2020   GFRNONAA >60 10/11/2020     Hepatic Lab Results  Component Value Date   AST 27 10/11/2020   ALT 26 10/11/2020   ALBUMIN 4.3 10/11/2020   ALKPHOS 65 10/11/2020   LIPASE 46 10/11/2020     Electrolytes Lab Results  Component Value Date   NA 141 10/11/2020   K 3.1 (L) 10/11/2020   CL 111 10/11/2020   CALCIUM 9.5 10/11/2020     Bone No results found for: VD25OH, VD125OH2TOT, XK5537SM2, LM7867JQ4, 25OHVITD1, 25OHVITD2, 25OHVITD3, TESTOFREE, TESTOSTERONE   Inflammation (CRP: Acute Phase) (ESR: Chronic Phase) Lab Results  Component Value Date   CRP <0.8 09/12/2018   ESRSEDRATE 16 09/12/2018   LATICACIDVEN 0.6 10/11/2020       Note: Above Lab results reviewed.  Recent Imaging Review  DG Bone Density EXAM: DUAL X-RAY ABSORPTIOMETRY (DXA) FOR BONE MINERAL DENSITY  IMPRESSION: Your patient Karena Kinker completed a BMD test on 11/29/2020 using the [employer] (software version: 14.10) manufactured by [employer]. The following summarizes the results of our evaluation. Technologist: MTB PATIENT BIOGRAPHICAL: Name: Charlei, Ramsaran Patient ID: [id number] Birth Date: Jul 14, 1961 Height: 55.0 in. Gender: Female Exam Date: 11/29/2020 Weight: 189.0 lbs. Indications: Lupus, Hip Replacement, Oophorectomy Bilateral, Hysterectomy, Rheumatoid Arthritis, COPD,  Caucasian, Asthma, History of Breast Cancer, fibromyalgia Fractures: Ribs Treatments: Albuterol, Calcium, Letrozole, Vitamin D DENSITOMETRY RESULTS: Site         Region     Measured Date Measured Age WHO Classification Young Adult T-score BMD         %Change vs. Previous Significant Change (*) AP Spine L1-L4 11/29/2020 57.3 Osteopenia -2.1 0.933 g/cm2 - -  Left Femur Neck 11/29/2020 57.3 Osteoporosis -2.9 0.636 g/cm2 -3.5% - Left Femur Neck 07/23/2018 54.9 Osteoporosis -2.7 0.659 g/cm2 - -  Left Femur Total 11/29/2020 57.3 Osteopenia -1.4 0.832 g/cm2 -0.5% - Left Femur Total 07/23/2018 54.9 Osteopenia -1.4 0.836 g/cm2 - -  Left Forearm Radius 33% 11/29/2020 57.3 Normal -0.4 0.837 g/cm2 - - ASSESSMENT: The BMD measured at Femur Neck is 0.636 g/cm2 with a T-score of -2.9. This patient is considered osteoporotic according to [facility]) criteria. The scan quality is good. Right Femur was excluded due to surgical hardware.  World Pharmacologist [facility]) criteria for post-menopausal, Caucasian Women: Normal:                   T-score at or above -1 SD Osteopenia/low bone mass: T-score between -1 and -2.5 SD Osteoporosis:             T-score at or below -2.5 SD  RECOMMENDATIONS: 1. All patients  should optimize calcium and vitamin D intake. 2. Consider FDA-approved medical therapies in postmenopausal women and men aged 35 years and older, based on the following: a. A hip or vertebral(clinical or morphometric) fracture b. T-score < -2.5 at the femoral neck or spine after appropriate evaluation to exclude secondary causes c. Low bone mass (T-score between -1.0 and -2.5 at the femoral neck or spine) and a 10-year probability of a hip fracture > 3% or a 10-year probability of a major osteoporosis-related fracture > 20% based on the [place]-adapted WHO algorithm 3. Clinician judgment and/or patient preferences may indicate treatment for people with 10-year fracture  probabilities above or below these levels FOLLOW-UP: People with diagnosed cases of osteoporosis or at high risk for fracture should have regular bone mineral density tests. For patients eligible for Medicare, routine testing is allowed once every 2 years. The testing frequency can be increased to one year for patients who have rapidly progressing disease, those who are receiving or discontinuing medical therapy to restore bone mass, or have additional risk factors. I have reviewed this report, and agree with the above findings.  [facility]  Electronically Signed   By: Kerby Moors M.D.   On: 11/29/2020 13:43 Note: Reviewed        Physical Exam  General appearance: Well nourished, well developed, and well hydrated. In no apparent acute distress Mental status: Alert, oriented x 3 (person, place, & time)       Respiratory: No evidence of acute respiratory distress Eyes: PERLA Vitals: BP 133/68 (BP Location: Right Arm, Patient Position: Sitting, Cuff Size: Normal)   Pulse 75   Temp (!) 97.3 F (36.3 C) (Temporal)   Resp 15   Ht 4' 7"  (1.397 m)   Wt 188 lb (85.3 kg)   SpO2 100%   BMI 43.70 kg/m  BMI: Estimated body mass index is 43.7 kg/m as calculated from the following:   Height as of this encounter: 4' 7"  (1.397 m).   Weight as of this encounter: 188 lb (85.3 kg). Ideal: Patient must be at least 60 in tall to calculate ideal body weight   Lumbar Exam  Skin & Axial Inspection:No masses, redness, or swelling Alignment:Symmetrical Functional SLP:NPYY restricted ROMaffecting both sides Stability:No instability detected Muscle Tone/Strength:Functionally intact. No obvious neuro-muscular anomalies detected. Sensory (Neurological):Musculoskeletal pain patternand dermatomal pain pattern  Gait & Posture Assessment  Ambulation:Patient ambulates using a cane Gait:Limited. Using assistive device to ambulate Posture:Difficulty standing up straight,  due to pain  Lower Extremity Exam    Side:Right lower extremity  Side:Left lower extremity  Stability:No instability observed  Stability:No instability observed  Skin & Extremity Inspection:Evidence of prior arthroplastic surgery  Skin & Extremity Inspection:Skin color, temperature, and hair growth are WNL. No peripheral edema or cyanosis. No masses, redness, swelling, asymmetry, or associated skin lesions. No contractures.  Functional FRT:MYTR restricted ROMfor all joints of the lower extremity   Functional ZNB:VAPO restricted ROM   Muscle Tone/Strength:Functionally intact. No obvious neuro-muscular anomalies detected.  Muscle Tone/Strength:Functionally intact. No obvious neuro-muscular anomalies detected.  Sensory (Neurological):Dermatomal pain pattern  Sensory (Neurological):Unimpaired  DTR: Patellar:0: absent Achilles:deferred today Plantar:deferred today  DTR: Patellar:0: absent Achilles:deferred today Plantar:deferred today  Palpation:No palpable anomalies  Palpation:No palpable anomalies   Assessment   Status Diagnosis  Controlled Controlled Controlled 1. Chronic pain syndrome   2. Lumbar radiculopathy   3. Primary osteoarthritis of right hip   4. Seronegative arthritis   5. Polyarthralgia   6. Fibromyalgia  Plan of Care   Victoria [name] has a current medication list which includes the following long-term medication(s): atorvastatin, azelastine, budesonide-formoterol, dabigatran, dicyclomine, famotidine, fluticasone, gabapentin, omeprazole, tiotropium bromide monohydrate, topiramate, and trazodone.  Pharmacotherapy (Medications Ordered): Meds ordered this encounter  Medications  . HYDROcodone-acetaminophen (NORCO) 7.5-325 MG tablet    Sig: Take 1 tablet by mouth every 8 (eight) hours as needed for severe pain. Must last 30 days.    Dispense:  90 tablet    Refill:  0     Chronic Pain. (STOP Act - Not applicable). Fill one day early if closed on scheduled refill date.  Marland Kitchen HYDROcodone-acetaminophen (NORCO) 7.5-325 MG tablet    Sig: Take 1 tablet by mouth every 8 (eight) hours as needed for severe pain. Must last 30 days.    Dispense:  90 tablet    Refill:  0    Chronic Pain. (STOP Act - Not applicable). Fill one day early if closed on scheduled refill date.  . gabapentin (NEURONTIN) 300 MG capsule    Sig: Take 1 capsule (300 mg total) by mouth 3 (three) times daily.    Dispense:  270 capsule    Refill:  0    Follow-up plan:   Return in about 3 months (around 03/17/2021) for Medication Management, in person.   Recent Visits Date Type Provider Dept  11/02/20 Office Visit Gillis Santa, MD [facility]  Showing recent visits within past 90 days and meeting all other requirements Today's Visits Date Type Provider Dept  12/21/20 Office Visit Gillis Santa, MD [facility]  Showing today's visits and meeting all other requirements Future Appointments Date Type Provider Dept  03/17/21 Appointment Gillis Santa, MD [facility]  Showing future appointments within next 90 days and meeting all other requirements  I discussed the assessment and treatment plan with the patient. The patient was provided an opportunity to ask questions and all were answered. The patient agreed with the plan and demonstrated an understanding of the instructions.  Patient advised to call back or seek an in-person evaluation if the symptoms or condition worsens.  Duration of encounter: 30 minutes.  Note by: Gillis Santa, MD Date: 12/21/2020; Time: 11:20 AM

## 2020-12-21 NOTE — Progress Notes (Signed)
Nursing Pain Medication Assessment:  Safety precautions to be maintained throughout the outpatient stay will include: orient to surroundings, keep bed in low position, maintain call bell within reach at all times, provide assistance with transfer out of bed and ambulation.  Medication Inspection Compliance: Pill count conducted under aseptic conditions, in front of the patient. Neither the pills nor the bottle was removed from the patient's sight at any time. Once count was completed pills were immediately returned to the patient in their original bottle.  Medication: Hydrocodone/APAP Pill/Patch Count: 54 of 90 pills remain Pill/Patch Appearance: Markings consistent with prescribed medication Bottle Appearance: Standard pharmacy container. Clearly labeled. Filled Date: 05 / 04/ 2022 Last Medication intake:  Today

## 2020-12-22 ENCOUNTER — Telehealth: Payer: Self-pay

## 2020-12-22 MED ORDER — GABAPENTIN 300 MG PO CAPS: 300.0000 mg | ORAL_CAPSULE | Freq: Three times a day (TID) | ORAL | 0 refills | Status: AC

## 2020-12-22 NOTE — Telephone Encounter (Signed)
Called patient and she states that she was taking Gabapentin 200mg  tablets, She took 2 tablets 3 times a day (1200mg ) Her new order is for Gabapentin 300mg  tablets, take two tablets BID. Patient wants to know why she got a decrease in this medication. Did not see in the notes. I will be glad to call this patient back.

## 2020-12-22 NOTE — Telephone Encounter (Signed)
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

## 2020-12-22 NOTE — Telephone Encounter (Signed)
Patient advised of BX results. Scheduled for surgery.

## 2020-12-22 NOTE — Telephone Encounter (Signed)
She has question about the gabapentin, she wants a nurse to call her

## 2020-12-22 NOTE — Telephone Encounter (Signed)
Recommend patient increase to 600 mg 3 times daily.  This is a total dose of 1800 mg, up 400 mg from her previous dose of 1200 mg total.  Requested Prescriptions   Signed Prescriptions Disp Refills  . gabapentin (NEURONTIN) 300 MG capsule 270 capsule 0    Sig: Take 1-2 capsules (300-600 mg total) by mouth 3 (three) times daily.    Authorizing Provider: Gillis Santa

## 2020-12-23 ENCOUNTER — Other Ambulatory Visit: Payer: Self-pay

## 2020-12-23 ENCOUNTER — Ambulatory Visit: Payer: Medicare Other

## 2020-12-23 DIAGNOSIS — R2681 Unsteadiness on feet: Secondary | ICD-10-CM

## 2020-12-23 DIAGNOSIS — M6281 Muscle weakness (generalized): Secondary | ICD-10-CM

## 2020-12-23 DIAGNOSIS — R2689 Other abnormalities of gait and mobility: Secondary | ICD-10-CM

## 2020-12-23 NOTE — Therapy (Signed)
[facility] SERVICES [address] [place], [place], [address] Phone: [phone]   Fax:  [phone]  Physical Therapy Treatment/DISCHARGE NOTE  Patient Details  Name: Victoria [name] MRN: [id number] Date of Birth: 14-Jun-1961 Referring Provider (PT): Vladimir Crofts, MD   Encounter Date: 12/23/2020   PT End of Session - 12/23/20 1624    Visit Number 19    Number of Visits 29    Date for PT Re-Evaluation 12/29/20    Authorization Type Prog note and recert performed 04/09/8181    PT Start Time 1310    PT Stop Time 1344    PT Time Calculation (min) 34 min    Equipment Utilized During Treatment Gait belt    Activity Tolerance Patient tolerated treatment well    Behavior During Therapy WFL for tasks assessed/performed           Past Medical History:  Diagnosis Date  . Allergy   . Asthma   . Cancer ([place])    Breast Left, DCIS 2018  . Collagen vascular disease (HCC)    Lupus  . COPD (chronic obstructive pulmonary disease) ([place])   . Diverticulitis   . Fibromyalgia   . GERD (gastroesophageal reflux disease)   . Headache    Migraines  . Hearing impaired person, left    bilateral hearing aids  . IBS (irritable bowel syndrome)   . Lupus ([place])   . Mitral valve disorder   . MS (multiple sclerosis) ([place])   . Neuromuscular disorder ([place])   . Neuropathy   . Osteoarthritis   . Osteoporosis 05/12/2019  . Personal history of radiation therapy 2017   left   . Rheumatoid arthritis (HCC)    hands and legs  . Seizures ([place])    no seizures for 10 years    Past Surgical History:  Procedure Laterality Date  . ABDOMINAL HYSTERECTOMY    . APPENDECTOMY    . BREAST EXCISIONAL BIOPSY Left 2017   lumpectomy with radation  . BREAST EXCISIONAL BIOPSY Left 2004   neg surgical bx   . BREAST LUMPECTOMY Left 2017   DCIS  . BREAST SURGERY    . CHOLECYSTECTOMY    . COLONOSCOPY WITH PROPOFOL N/A 07/07/2019   Procedure: COLONOSCOPY WITH  PROPOFOL;  Surgeon: Toledo, Benay Pike, MD;  Location: [facility] END[facility];  Service: Gastroenterology;  Laterality: N/A;  . DIRECT LARYNGOSCOPY N/A 09/22/2020   Procedure: SUSPENSION MICRODIRECT LARYNGOSCOPY WITH BIOPSY;  Surgeon: Carloyn Manner, MD;  Location: [facility];  Service: ENT;  Laterality: N/A;  has port a cath needs accessed  . ESOPHAGOGASTRODUODENOSCOPY (EGD) WITH PROPOFOL N/A 07/07/2019   Procedure: ESOPHAGOGASTRODUODENOSCOPY (EGD) WITH PROPOFOL;  Surgeon: Toledo, Benay Pike, MD;  Location: [facility] ENDOS[facility];  Service: Gastroenterology;  Laterality: N/A;  . PATENT DUCTUS ARTERIOUS REPAIR    . RECONSTRUCTION TENDON PULLEY HAND Left   . TOTAL HIP ARTHROPLASTY Right     There were no vitals filed for this visit.   Subjective Assessment - 12/23/20 1632    Subjective Pt presents today to PT for follow-up session. Pt reports she is feeling confident with her HEP reviewed previous session. Pt reports she is anticipating upcoming surgery for a skin CA.    Pertinent History Per chart: PMH includes: Lupus, seronegative arthritis, fibromyalgia, osteoporosis, breast cancer(radiation), morbid obesity, DVTs, IBS,COPD, chronic migraine, lymphedema, thrombocytopenia. Seizures, RA, MS? back pain that radiates down into both hips and legs. She has bilateral foot numbness. Toes will turn purple.  Limitations Sitting;Walking    How long can you sit comfortably? Pt says sitting limited to 20 minutes, worsened with spinal extension and improved with flexion.    How long can you stand comfortably? no reported difficulty    How long can you walk comfortably? Pt reports difficulty with prolonged walking    Diagnostic tests MRI done on 05/10/2020 (see physician note)    Patient Stated Goals Pt would like to increae LE strength and decrease pain in order to walk her dog    Currently in Pain? Yes    Pain Location Leg    Pain Onset More than a month ago          TREATMENT  Neuro-Reeducation Korebalance -  CGA provided throughout Tux racer - 4x. BUE support to UUE support. Emphasis on anticipatory postural control, ankle strategies. Pt meets all targets at slower speeds, but difficulty with anticipatory post. Control with increased speed.   Pt performed the following addition to her HEP for review prior to d/c: Access Code: [email or web address] URL: [email or web address] Date: 12/23/2020 Prepared by: Ricard Dillon  Exercises  . Standing Tandem Balance with Counter Support - 1 x daily - 7 x weekly - 2 sets - 2 reps - 30 hold . Single Leg Stance with Support - 1 x daily - 7 x weekly - 2 sets - 2 reps - 30 hold . Standing Balance in Corner with Eyes Closed - 1 x daily - 7 x weekly - 2 sets - 2 reps - 30 hold   Assessment: Pt presents to PT for follow-up session to review her HEP. Pt demos good technique with all additional balance exercises added to home program. She reports she is confident in performing the other exercises as well. Pt verbalizes understanding that should she see functional decline or decrease in balance that she can return to PT with new referral. Since pt started PT she has seen improvements in overall perceived functional mobility, increased gait speed, LE strength and power and improvements in balance. The pt does not require further skilled PT at this time.     PT Education - 12/23/20 1632    Education Details Review of full HEP, d/c    Person(s) Educated Patient    Methods Explanation    Comprehension Verbalized understanding            PT Short Term Goals - 12/21/20 2049      PT SHORT TERM GOAL #1   Title Patient will be independent in home exercise program to improve strength/mobility for better functional independence with ADLs.    Baseline 08/17/2020 Pt issued HEP; 10/06/2020 Pt reports she is confident with HEP, advanced HEP to be issued future session; 5/17: Pt demos and verbalizes independence with HEP    Time 6     Period Weeks    Status Achieved    Target Date 11/17/20             PT Long Term Goals - 12/21/20 2050      PT LONG TERM GOAL #1   Title Patient will increase FOTO score to equal to or greater than 60 to demonstrate statistically significant improvement in mobility and quality of life.    Baseline 08/17/2020 Pt score 55 (risk-adjusted 50); 10/06/2020 48; 5/17: 45    Time 12    Period Weeks    Status On-going    Target Date 12/29/20      PT LONG TERM GOAL #2   Title Pt will increase  MMT scores by at least 1/2 point to indicate increased B LE strength for performing functional activities.    Baseline 08/17/2020 MMT L/R hip flexion 3/5, 3/5; hip abduction 3/5, 3+/5; hip adduction 2+/5, 3-/5; knee flexion 4-/5, 3+/5, knee extension 4-/5, 3/5; ankle 3/5 B; ankle plantarflexion 4/5 B; 10/06/2020 Hip flexion 4/5 B; hip abduction 4+/5 B; hip adduction 3+/5 and pain limited; knee extension 4+/5 B, L knee flexion 4+/5, R knee flexion 5/5; B ankle plantarflexion and dorsiflexion both 5/5 B; 5/17:hip flexors 4/5 B, hip ab 5/5 B, hip ad 4/5 B, knee ext 5/5 B, knee flex 4+/5 B, ankle df and pf 5/5 B    Time 8    Period Weeks    Status Achieved      PT LONG TERM GOAL #3   Title Patient will increase 10 meter walk test to >1.105ms as to improve gait speed for better community ambulation and to reduce fall risk.    Baseline 08/17/2020 0.65 m/s no AD; 0.71 m/s with no AD; 5/17: 0.85 m/s no AD    Time 12    Period Weeks    Status On-going    Target Date 12/29/20      PT LONG TERM GOAL #4   Title Patient (< 616years old) will complete five times sit to stand test in < 10 seconds indicating an increased LE strength and improved balance.    Baseline 08/17/2020 5xSTS completed in 14.98 sec; 10/06/2020 13.81 sec; 12/21/2020 11.9 sec    Time 12    Period Weeks    Status Partially Met    Target Date 12/29/20      PT LONG TERM GOAL #5   Title Patient will increase Berg Balance score by > 6 points to  demonstrate decreased fall risk during functional activities.    Baseline 08/17/2020 Berg score 50/56; 10/06/2020 54/56; 5/17: Berg 54/56    Time 12    Period Weeks    Status Partially Met    Target Date 12/29/20                 Plan - 12/23/20 1617    Clinical Impression Statement Pt presents to PT for follow-up session to review her HEP. Pt demos good technique with all additional balance exercises added to home program. She reports she is confident in performing the other exercises as well. Pt verbalizes understanding that should she see functional decline or decrease in balance that she can return to PT with new referral. Since pt started PT she has seen improvements in overall perceived functional mobility, increased gait speed, LE strength and power and improvements in balance. The pt does not require further skilled PT at this time.    Personal Factors and Comorbidities Comorbidity 1;Comorbidity 2;Comorbidity 3+;Fitness    Comorbidities pertinent: hx of thrombosis, chronic fatigue, osteoporisis, HA disorder, polyathralgia, COPD, IBS, Fibromyalgia, lupus    Examination-Activity Limitations Stairs;Transfers;Locomotion Level;Sleep;Squat;Stand;Reach Overhead    Examination-Participation Restrictions Community Activity;Meal Prep;Laundry;Shop;Volunteer;Yard Work;Interpersonal Relationship    Stability/Clinical Decision Making Evolving/Moderate complexity    Rehab Potential Good    PT Frequency 1x / week    PT Duration 12 weeks    PT Treatment/Interventions ADLs/Self Care Home Management;Electrical Stimulation;Moist Heat;DME Instruction;Gait training;Stair training;Cryotherapy;Ultrasound;Functional mobility training;Neuromuscular re-education;Balance training;Therapeutic exercise;Therapeutic activities;Patient/family education;Orthotic Fit/Training;Manual techniques;Passive range of motion;Energy conservation;Taping;Canalith Repostioning;Vestibular;Visual/perceptual remediation/compensation     PT Next Visit Plan advance HEP; continue current POC, balance and cooridnation exercises; progress resistance with exercises; ankle strategies, endurance exercises, pain-modulation techniques  PT Home Exercise Plan see HEP (medbridge link)    Consulted and Agree with Plan of Care Patient           Patient will benefit from skilled therapeutic intervention in order to improve the following deficits and impairments:  Abnormal gait,Decreased activity tolerance,Decreased balance,Decreased endurance,Decreased coordination,Decreased mobility,Decreased strength,Decreased range of motion,Pain,Improper body mechanics,Postural dysfunction,Impaired flexibility,Difficulty walking,Cardiopulmonary status limiting activity,Decreased knowledge of use of DME,Obesity  Visit Diagnosis: Unsteadiness on feet  Other abnormalities of gait and mobility  Muscle weakness (generalized)     Problem List Patient Active Problem List   Diagnosis Date Noted  . Prediabetes 09/24/2020  . Vitamin D deficiency 08/22/2019  . History of thrombosis 08/22/2019  . Chronic fatigue 07/17/2019  . Osteoporosis 05/12/2019  . STEC (Shiga toxin-producing Escherichia coli) infection 04/06/2019  . Diverticulitis 04/06/2019  . Thrombocytopenia ([place]) 01/23/2019  . Leukopenia 01/23/2019  . Lymphedema of both lower extremities 01/16/2019  . Chronic migraine without aura 11/22/2018  . Polyarthralgia 10/14/2018  . Headache disorder 10/14/2018  . Chronic daily headache 10/11/2018  . COPD (chronic obstructive pulmonary disease) ([place]) 10/11/2018  . Thoracic aortic atherosclerosis ([place]) 10/11/2018  . Multiple episodes of deep venous thrombosis ([place]) 09/17/2018  . IBS (irritable bowel syndrome) 09/17/2018  . Healthcare maintenance 09/17/2018  . Primary osteoarthritis of right hip 09/03/2018  . Presence of right hip implant 09/03/2018  . Morbid obesity with BMI of 45.0-49.9, adult ([place]) 08/23/2018  . Shortness of breath  08/22/2018  . History of ductal carcinoma in situ (DCIS) of breast 07/20/2018  . Aromatase inhibitor use 07/20/2018  . Abnormal findings on auscultation 07/16/2018  . Encounter for long-term (current) use of high-risk medication 07/16/2018  . Fibromyalgia 07/16/2018  . Other forms of systemic lupus erythematosus ([place]) 07/16/2018  . Other osteoporosis without current pathological fracture 07/16/2018  . Seronegative arthritis 07/16/2018   Ricard Dillon PT, DPT 12/23/2020, 4:33 PM  [facility] SERVICES [address] [place], [place], [address] Phone: [phone]   Fax:  [phone]  Name: Victoria [name] MRN: [id number] Date of Birth: March 27, 1961

## 2020-12-27 NOTE — Telephone Encounter (Signed)
Called patient. Patient with understanding.

## 2020-12-28 ENCOUNTER — Ambulatory Visit: Payer: Medicare Other

## 2020-12-30 ENCOUNTER — Ambulatory Visit: Payer: Medicare Other

## 2021-01-04 ENCOUNTER — Ambulatory Visit: Payer: Medicare Other

## 2021-01-06 ENCOUNTER — Ambulatory Visit: Payer: Medicare Other

## 2021-01-17 ENCOUNTER — Other Ambulatory Visit
Admission: RE | Admit: 2021-01-17 | Discharge: 2021-01-17 | Disposition: A | Discharge status: Home / Self Care | Payer: Medicare Other | Attending: Internal Medicine | Admitting: Internal Medicine

## 2021-01-17 DIAGNOSIS — R1013 Epigastric pain: Secondary | ICD-10-CM | POA: Diagnosis present

## 2021-01-17 LAB — COMPREHENSIVE METABOLIC PANEL
ALT: 21 U/L (ref 0–44)
AST: 27 U/L (ref 15–41)
Albumin: 4 g/dL (ref 3.5–5.0)
Alkaline Phosphatase: 54 U/L (ref 38–126)
Anion gap: 9 (ref 5–15)
BUN: 14 mg/dL (ref 6–20)
CO2: 23 mmol/L (ref 22–32)
Calcium: 9.7 mg/dL (ref 8.9–10.3)
Chloride: 111 mmol/L (ref 98–111)
Creatinine, Ser: 0.85 mg/dL (ref 0.44–1.00)
GFR, Estimated: >60 mL/min (ref >60)
Glucose, Bld: 93 mg/dL (ref 70–99)
Potassium: 4.4 mmol/L (ref 3.5–5.1)
Sodium: 143 mmol/L (ref 135–145)
Total Bilirubin: 1.1 mg/dL (ref 0.3–1.2)
Total Protein: 6.7 g/dL (ref 6.5–8.1)

## 2021-01-17 LAB — CBC WITH DIFFERENTIAL/PLATELET
Abs Immature Granulocytes: 0.03 10*3/uL (ref 0.00–0.07)
Basophils Absolute: 0 10*3/uL (ref 0.0–0.1)
Basophils Relative: 1 %
Eosinophils Absolute: 0 10*3/uL (ref 0.0–0.5)
Eosinophils Relative: 0 %
HCT: 40.1 % (ref 36.0–46.0)
Hemoglobin: 13.8 g/dL (ref 12.0–15.0)
Immature Granulocytes: 1 %
Lymphocytes Relative: 27 %
Lymphs Abs: 1.1 10*3/uL (ref 0.7–4.0)
MCH: 30.2 pg (ref 26.0–34.0)
MCHC: 34.4 g/dL (ref 30.0–36.0)
MCV: 87.7 fL (ref 80.0–100.0)
Monocytes Absolute: 0.3 10*3/uL (ref 0.1–1.0)
Monocytes Relative: 7 %
Neutro Abs: 2.7 10*3/uL (ref 1.7–7.7)
Neutrophils Relative %: 64 %
Platelets: 143 10*3/uL — ABNORMAL LOW (ref 150–400)
RBC: 4.57 MIL/uL (ref 3.87–5.11)
RDW: 14.1 % (ref 11.5–15.5)
WBC: 4.2 10*3/uL (ref 4.0–10.5)
nRBC: 0 % (ref 0.0–0.2)

## 2021-01-17 LAB — LIPASE, BLOOD: Lipase: 35 U/L (ref 11–51)

## 2021-01-18 ENCOUNTER — Telehealth: Payer: Self-pay

## 2021-01-18 NOTE — Telephone Encounter (Signed)
Discussed with pt per Dr Raliegh Ip she does not have to stop Norco tablets before her surgery appt June 28

## 2021-01-18 NOTE — Telephone Encounter (Signed)
Pt called she has surgery scheduled for June 28, she would like to know if she should stop Norco tablets she takes daily

## 2021-01-20 ENCOUNTER — Inpatient Hospital Stay: Payer: Medicare Other | Attending: Oncology

## 2021-02-01 ENCOUNTER — Telehealth: Payer: Self-pay

## 2021-02-01 ENCOUNTER — Other Ambulatory Visit: Payer: Self-pay

## 2021-02-01 ENCOUNTER — Ambulatory Visit (INDEPENDENT_AMBULATORY_CARE_PROVIDER_SITE_OTHER): Payer: Medicare Other | Admitting: Dermatology

## 2021-02-01 DIAGNOSIS — D485 Neoplasm of uncertain behavior of skin: Secondary | ICD-10-CM

## 2021-02-01 DIAGNOSIS — D229 Melanocytic nevi, unspecified: Secondary | ICD-10-CM

## 2021-02-01 DIAGNOSIS — D225 Melanocytic nevi of trunk: Secondary | ICD-10-CM

## 2021-02-01 MED ORDER — MUPIROCIN 2 % EX OINT: 1.0000 "application " | TOPICAL_OINTMENT | Freq: Every day | CUTANEOUS | 1 refills | Status: DC

## 2021-02-01 NOTE — Patient Instructions (Signed)
Wound Care Instructions  Cleanse wound gently with soap and water once a day then pat dry with clean gauze. Apply a thing coat of Petrolatum (petroleum jelly, "Vaseline") over the wound (unless you have an allergy to this). We recommend that you use a new, sterile tube of Vaseline. Do not pick or remove scabs. Do not remove the yellow or white "healing tissue" from the base of the wound.  Cover the wound with fresh, clean, nonstick gauze and secure with paper tape. You may use Band-Aids in place of gauze and tape if the would is small enough, but would recommend trimming much of the tape off as there is often too much. Sometimes Band-Aids can irritate the skin.  You should call the office for your biopsy report after 1 week if you have not already been contacted.  If you experience any problems, such as abnormal amounts of bleeding, swelling, significant bruising, significant pain, or evidence of infection, please call the office immediately.  FOR ADULT SURGERY PATIENTS: If you need something for pain relief you may take 1 extra strength Tylenol (acetaminophen) AND 2 Ibuprofen (200mg each) together every 4 hours as needed for pain. (do not take these if you are allergic to them or if you have a reason you should not take them.) Typically, you may only need pain medication for 1 to 3 days.   If you have any questions or concerns for your doctor, please call our main line at [phone] and press option 4 to reach your doctor's medical assistant. If no one answers, please leave a voicemail as directed and we will return your call as soon as possible. Messages left after 4 pm will be answered the following business day.   You may also send us a message via MyChart. We typically respond to MyChart messages within 1-2 business days.  For prescription refills, please ask your pharmacy to contact our office. Our fax number is [phone].  If you have an urgent issue when the clinic is closed that  cannot wait until the next business day, you can page your doctor at the number below.    Please note that while we do our best to be available for urgent issues outside of office hours, we are not available 24/7.   If you have an urgent issue and are unable to reach us, you may choose to seek medical care at your doctor's office, retail clinic, urgent care center, or emergency room.  If you have a medical emergency, please immediately call 911 or go to the emergency department.  Pager Numbers  - Dr. Kowalski: [phone]  - Dr. Moye: [phone]  - Dr. Stewart: [phone]  In the event of inclement weather, please call our main line at [phone] for an update on the status of any delays or closures.  Dermatology Medication Tips: Please keep the boxes that topical medications come in in order to help keep track of the instructions about where and how to use these. Pharmacies typically print the medication instructions only on the boxes and not directly on the medication tubes.   If your medication is too expensive, please contact our office at [phone] option 4 or send us a message through MyChart.   We are unable to tell what your co-pay for medications will be in advance as this is different depending on your insurance coverage. However, we may be able to find a substitute medication at lower cost or fill out paperwork to get insurance to cover a needed   medication.   If a prior authorization is required to get your medication covered by your insurance company, please allow us 1-2 business days to complete this process.  Drug prices often vary depending on where the prescription is filled and some pharmacies may offer cheaper prices.  The website www.goodrx.com contains coupons for medications through different pharmacies. The prices here do not account for what the cost may be with help from insurance (it may be cheaper with your insurance), but the website can give you the  price if you did not use any insurance.  - You can print the associated coupon and take it with your prescription to the pharmacy.  - You may also stop by our office during regular business hours and pick up a GoodRx coupon card.  - If you need your prescription sent electronically to a different pharmacy, notify our office through [place] MyChart or by phone at [phone] option 4.   

## 2021-02-01 NOTE — Progress Notes (Deleted)
Follow-Up Visit   Subjective  Victoria [name] is a 60 y.o. 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  The following portions of the chart were reviewed this encounter and updated as appropriate:   Tobacco  Allergies  Meds  Problems  Med Hx  Surg Hx  Fam Hx      Review of Systems:  No other skin or systemic complaints except as noted in HPI or Assessment and Plan.  Objective  Well appearing patient in no apparent distress; mood and affect are within normal limits.  A focused examination was performed including right buttock. Relevant physical exam findings are noted in the Assessment and Plan.  Right inf buttock 1.6 x 1.0 cm healing biopsy site  Right inf lat buttock 1.1 cm irregular brown macule  Right inf lat buttock at buttocks crease 0.7 cm irregular brown macule   Assessment & Plan  Neoplasm of uncertain behavior of skin (3) Right inf buttock  Skin excision  Lesion length (cm):  1.6 Lesion width (cm):  1 Margin per side (cm):  0.2 Total excision diameter (cm):  2 Informed consent: discussed and consent obtained   Timeout: patient name, date of birth, surgical site, and procedure verified   Procedure prep:  Patient was prepped and draped in usual sterile fashion Prep type:  Isopropyl alcohol and povidone-iodine Anesthesia: the lesion was anesthetized in a standard fashion   Anesthetic:  1% lidocaine w/ epinephrine 1-100,000 buffered w/ 8.4% NaHCO3 Instrument used: #15 blade   Hemostasis achieved with: pressure   Hemostasis achieved with comment:  Electrocautery Outcome: patient tolerated procedure well with no complications   Post-procedure details: sterile dressing applied and wound care instructions given   Dressing type: bandage and pressure dressing (mupirocin)    Skin repair Complexity:  Complex Final length (cm):  4 Reason for type of repair: reduce tension to allow closure,  reduce the risk of dehiscence, infection, and necrosis, reduce subcutaneous dead space and avoid a hematoma, allow closure of the large defect, preserve normal anatomy, preserve normal anatomical and functional relationships and enhance both functionality and cosmetic results   Undermining: area extensively undermined   Undermining comment:  Undermining defect 1.5 cm Subcutaneous layers (deep stitches):  Suture size:  2-0 Suture type: Vicryl (polyglactin 910)   Subcutaneous suture technique: inverted dermal. Fine/surface layer approximation (top stitches):  Suture size:  3-0 Suture type: nylon   Stitches: simple running   Suture removal (days):  7 Hemostasis achieved with: suture and pressure Outcome: patient tolerated procedure well with no complications   Post-procedure details: sterile dressing applied and wound care instructions given   Dressing type: bandage and pressure dressing (mupirocin)    mupirocin ointment (BACTROBAN) 2 % Apply 1 application topically daily. With dressing changes  Specimen 1 - Surgical pathology Differential Diagnosis: Biopsy proven moderate-severe dysplastic nevus Check Margins: No [id number]  Right inf lat buttock  Epidermal / dermal shaving  Lesion diameter (cm):  1.1 Informed consent: discussed and consent obtained   Timeout: patient name, date of birth, surgical site, and procedure verified   Procedure prep:  Patient was prepped and draped in usual sterile fashion Prep type:  Isopropyl alcohol Anesthesia: the lesion was anesthetized in a standard fashion   Anesthetic:  1% lidocaine w/ epinephrine 1-100,000 buffered w/ 8.4% NaHCO3 Instrument used: flexible razor blade   Hemostasis achieved with: pressure, aluminum chloride and electrodesiccation   Outcome: patient tolerated procedure well  Post-procedure details: sterile dressing applied and wound care instructions given   Dressing type: bandage and petrolatum    Specimen 2 - Surgical  pathology Differential Diagnosis: Nevus vs dysplastic nevus Check Margins: No  Right inf lat buttock at buttocks crease  Epidermal / dermal shaving  Lesion diameter (cm):  0.7 Informed consent: discussed and consent obtained   Timeout: patient name, date of birth, surgical site, and procedure verified   Procedure prep:  Patient was prepped and draped in usual sterile fashion Prep type:  Isopropyl alcohol Anesthesia: the lesion was anesthetized in a standard fashion   Anesthetic:  1% lidocaine w/ epinephrine 1-100,000 buffered w/ 8.4% NaHCO3 Instrument used: flexible razor blade   Hemostasis achieved with: pressure, aluminum chloride and electrodesiccation   Outcome: patient tolerated procedure well   Post-procedure details: sterile dressing applied and wound care instructions given   Dressing type: bandage and petrolatum    Specimen 3 - Surgical pathology Differential Diagnosis: Nevus vs dysplastic nevus Check Margins: No  Start Mupirocin oint qd to bx and excision sites  Melanocytic Nevi - Tan-brown and/or pink-flesh-colored symmetric macules and papules - Benign appearing on exam today - Observation - Call clinic for new or changing moles - Recommend daily use of broad spectrum spf 30+ sunscreen to sun-exposed areas.   Actinic Damage - chronic, secondary to cumulative UV radiation exposure/sun exposure over time - diffuse scaly erythematous macules with underlying dyspigmentation - Recommend daily broad spectrum sunscreen SPF 30+ to sun-exposed areas, reapply every 2 hours as needed.  - Recommend staying in the shade or wearing long sleeves, sun glasses (UVA+UVB protection) and wide brim hats (4-inch brim around the entire circumference of the hat). - Call for new or changing lesions.  Return in about 1 week (around 02/08/2021) for suture removal.  I, Ashok Cordia, CMA, am acting as scribe for Sarina Ser, MD .  Documentation: I have reviewed the above documentation for  accuracy and completeness, and I agree with the above.  Sarina Ser, MD

## 2021-02-01 NOTE — Telephone Encounter (Signed)
Left message for patient regarding surgery/hd  

## 2021-02-08 ENCOUNTER — Other Ambulatory Visit: Payer: Self-pay

## 2021-02-08 ENCOUNTER — Ambulatory Visit: Payer: Medicare Other

## 2021-02-08 DIAGNOSIS — D239 Other benign neoplasm of skin, unspecified: Secondary | ICD-10-CM

## 2021-02-08 NOTE — Progress Notes (Signed)
   Follow-Up Visit   Subjective  Victoria [name] is a 60 y.o. female who presents for the following: suture removal (Patient is here today for suture removal. She c/o drainage on her pants and oozing from site, but no pain.).   The following portions of the chart were reviewed this encounter and updated as appropriate:       Review of Systems:  No other skin or systemic complaints except as noted in HPI or Assessment and Plan.  Objective  Well appearing patient in no apparent distress; mood and affect are within normal limits.  A focused examination was performed including the right buttock. Relevant physical exam findings are noted in the Assessment and Plan.  R inf buttock Healing excision site.    Assessment & Plan  Dysplastic nevus R inf buttock  Encounter for Removal of Sutures - Incision site at the R inf buttock is clean, dry and intact - Wound cleansed, sutures removed, wound cleansed and steri strips applied.  - Will contact patient with pathology results when available. - Patient advised to keep steri-strips dry until they fall off. - Scars remodel for a full year. - Once steri-strips fall off, patient can apply over-the-counter silicone scar cream each night to help with scar remodeling if desired. - Patient advised to call with any concerns or if they notice any new or changing lesions.  Site does not appear to be infected today. Patient does have resolving blistering from bandage which may be where she experienced draining from. Advised patient to use Mupirocin 2% ointment to healing areas and if too bothersome do not use a bandage on aa's.    Return for appointment as scheduled.

## 2021-02-08 NOTE — Patient Instructions (Signed)

## 2021-02-12 ENCOUNTER — Encounter: Payer: Self-pay | Admitting: Dermatology

## 2021-02-12 DIAGNOSIS — Z86018 Personal history of other benign neoplasm: Secondary | ICD-10-CM

## 2021-02-12 DIAGNOSIS — D239 Other benign neoplasm of skin, unspecified: Secondary | ICD-10-CM

## 2021-02-12 HISTORY — DX: Other benign neoplasm of skin, unspecified: D23.9

## 2021-02-12 HISTORY — DX: Personal history of other benign neoplasm: Z86.018

## 2021-02-14 ENCOUNTER — Telehealth: Payer: Self-pay

## 2021-02-14 NOTE — Progress Notes (Signed)
Follow-Up Visit   Subjective  Victoria [name] is a 60 y.o. 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 She has other moles and spots to be checked.  The following portions of the chart were reviewed this encounter and updated as appropriate:   Tobacco  Allergies  Meds  Problems  Med Hx  Surg Hx  Fam Hx     Review of Systems:  No other skin or systemic complaints except as noted in HPI or Assessment and Plan.  Objective  Well appearing patient in no apparent distress; mood and affect are within normal limits.  A focused examination was performed including right buttock. Relevant physical exam findings are noted in the Assessment and Plan.  Right inf buttock 1.6 x 1.0 cm healing biopsy site  Right inf lat buttock 1.1 cm irregular brown macule  Right inf lat buttock at buttocks crease 0.7 cm irregular brown macule   Assessment & Plan  Neoplasm of uncertain behavior of skin (3) Right inf buttock  Skin excision  Lesion length (cm):  1.6 Lesion width (cm):  1 Margin per side (cm):  0.2 Total excision diameter (cm):  2 Informed consent: discussed and consent obtained   Timeout: patient name, date of birth, surgical site, and procedure verified   Procedure prep:  Patient was prepped and draped in usual sterile fashion Prep type:  Isopropyl alcohol and povidone-iodine Anesthesia: the lesion was anesthetized in a standard fashion   Anesthetic:  1% lidocaine w/ epinephrine 1-100,000 buffered w/ 8.4% NaHCO3 Instrument used: #15 blade   Hemostasis achieved with: pressure   Hemostasis achieved with comment:  Electrocautery Outcome: patient tolerated procedure well with no complications   Post-procedure details: sterile dressing applied and wound care instructions given   Dressing type: bandage and pressure dressing (mupirocin)    Skin repair Complexity:  Complex Final length (cm):  4 Reason for type of  repair: reduce tension to allow closure, reduce the risk of dehiscence, infection, and necrosis, reduce subcutaneous dead space and avoid a hematoma, allow closure of the large defect, preserve normal anatomy, preserve normal anatomical and functional relationships and enhance both functionality and cosmetic results   Undermining: area extensively undermined   Undermining comment:  Undermining defect 1.5 cm Subcutaneous layers (deep stitches):  Suture size:  2-0 Suture type: Vicryl (polyglactin 910)   Subcutaneous suture technique: inverted dermal. Fine/surface layer approximation (top stitches):  Suture size:  3-0 Suture type: nylon   Stitches: simple running   Suture removal (days):  7 Hemostasis achieved with: suture and pressure Outcome: patient tolerated procedure well with no complications   Post-procedure details: sterile dressing applied and wound care instructions given   Dressing type: bandage and pressure dressing (mupirocin)    mupirocin ointment (BACTROBAN) 2 % Apply 1 application topically daily. With dressing changes  Specimen 1 - Surgical pathology Differential Diagnosis: Biopsy proven moderate-severe dysplastic nevus Check Margins: No [id number]  Right inf lat buttock  Epidermal / dermal shaving  Lesion diameter (cm):  1.1 Informed consent: discussed and consent obtained   Timeout: patient name, date of birth, surgical site, and procedure verified   Procedure prep:  Patient was prepped and draped in usual sterile fashion Prep type:  Isopropyl alcohol Anesthesia: the lesion was anesthetized in a standard fashion   Anesthetic:  1% lidocaine w/ epinephrine 1-100,000 buffered w/ 8.4% NaHCO3 Instrument used: flexible razor blade   Hemostasis achieved with: pressure, aluminum chloride and electrodesiccation  Outcome: patient tolerated procedure well   Post-procedure details: sterile dressing applied and wound care instructions given   Dressing type: bandage and  petrolatum    Specimen 2 - Surgical pathology Differential Diagnosis: Nevus vs dysplastic nevus Check Margins: No  Right inf lat buttock at buttocks crease  Epidermal / dermal shaving  Lesion diameter (cm):  0.7 Informed consent: discussed and consent obtained   Timeout: patient name, date of birth, surgical site, and procedure verified   Procedure prep:  Patient was prepped and draped in usual sterile fashion Prep type:  Isopropyl alcohol Anesthesia: the lesion was anesthetized in a standard fashion   Anesthetic:  1% lidocaine w/ epinephrine 1-100,000 buffered w/ 8.4% NaHCO3 Instrument used: flexible razor blade   Hemostasis achieved with: pressure, aluminum chloride and electrodesiccation   Outcome: patient tolerated procedure well   Post-procedure details: sterile dressing applied and wound care instructions given   Dressing type: bandage and petrolatum    Specimen 3 - Surgical pathology Differential Diagnosis: Nevus vs dysplastic nevus Check Margins: No  Start Mupirocin oint qd to bx and excision sites  Melanocytic Nevi - Tan-brown and/or pink-flesh-colored symmetric macules and papules - Benign appearing on exam today - Observation - Call clinic for new or changing moles - Recommend daily use of broad spectrum spf 30+ sunscreen to sun-exposed areas.   Return in about 1 week (around 02/08/2021) for suture removal.  I, Ashok Cordia, CMA, am acting as scribe for Sarina Ser, MD .  Documentation: I have reviewed the above documentation for accuracy and completeness, and I agree with the above.  Sarina Ser, MD

## 2021-02-14 NOTE — Telephone Encounter (Signed)
Patient called to inquire about biopsy results. Discussed results documented by provider. Patient verbalized understanding and denied further questions at this time.

## 2021-03-12 ENCOUNTER — Other Ambulatory Visit: Payer: Self-pay | Admitting: Student in an Organized Health Care Education/Training Program

## 2021-03-17 ENCOUNTER — Ambulatory Visit
Payer: Medicare Other | Attending: Student in an Organized Health Care Education/Training Program | Admitting: Student in an Organized Health Care Education/Training Program

## 2021-03-17 ENCOUNTER — Encounter: Payer: Self-pay | Admitting: Student in an Organized Health Care Education/Training Program

## 2021-03-17 ENCOUNTER — Other Ambulatory Visit: Payer: Self-pay

## 2021-03-17 ENCOUNTER — Inpatient Hospital Stay: Payer: Medicare Other

## 2021-03-17 VITALS — BP 138/69 | HR 83 | Temp 97.0°F | Resp 16 | Ht <= 58 in | Wt 185.0 lb

## 2021-03-17 DIAGNOSIS — G894 Chronic pain syndrome: Secondary | ICD-10-CM | POA: Insufficient documentation

## 2021-03-17 DIAGNOSIS — M2550 Pain in unspecified joint: Secondary | ICD-10-CM | POA: Diagnosis present

## 2021-03-17 DIAGNOSIS — M1380 Other specified arthritis, unspecified site: Secondary | ICD-10-CM | POA: Diagnosis not present

## 2021-03-17 DIAGNOSIS — M5416 Radiculopathy, lumbar region: Secondary | ICD-10-CM | POA: Insufficient documentation

## 2021-03-17 DIAGNOSIS — M797 Fibromyalgia: Secondary | ICD-10-CM | POA: Diagnosis present

## 2021-03-17 DIAGNOSIS — M1611 Unilateral primary osteoarthritis, right hip: Secondary | ICD-10-CM | POA: Diagnosis not present

## 2021-03-17 MED ORDER — HYDROCODONE-ACETAMINOPHEN 7.5-325 MG PO TABS: 1.0000 | ORAL_TABLET | Freq: Three times a day (TID) | ORAL | 0 refills | Status: AC | PRN

## 2021-03-17 NOTE — Progress Notes (Signed)
Nursing Pain Medication Assessment:  Safety precautions to be maintained throughout the outpatient stay will include: orient to surroundings, keep bed in low position, maintain call bell within reach at all times, provide assistance with transfer out of bed and ambulation.  Medication Inspection Compliance: Victoria [name] did not comply with our request to bring her pills to be counted. She was reminded that bringing the medication bottles, even when empty, is a requirement.  Medication: None brought in. Pill/Patch Count: None available to be counted. Bottle Appearance: No container available. Did not bring bottle(s) to appointment. Filled Date: N/A Last Medication intake:  Ran out of medicine more than 48 hours ago

## 2021-03-17 NOTE — Progress Notes (Signed)
PROVIDER NOTE: Information contained herein reflects review and annotations entered in association with encounter. Interpretation of such information and data should be left to medically-trained personnel. Information provided to patient can be located elsewhere in the medical record under "Patient Instructions". Document created using STT-dictation technology, any transcriptional errors that may result from process are unintentional.    Patient: Victoria [name]  Service Category: E/M  Provider: Gillis Santa, MD  DOB: 12/18/1960  DOS: 03/17/2021  Specialty: Interventional Pain Management  MRN: [id number]  Setting: Ambulatory outpatient  PCP: Victoria Ruths, MD  Type: Established Patient    Referring Provider: Kirk Ruths, MD  Location: Office  Delivery: Face-to-face     HPI  [name], a 60 y.o. year old female, is here today because of her Chronic pain syndrome [G89.4]. [name] primary complain today is Back Pain (low), Lupus, and Fibromyalgia Last encounter: My last encounter with her was on 11/02/2020. Pertinent problems: [name] has COPD (chronic obstructive pulmonary disease) ([place]); Polyarthralgia; Morbid obesity with BMI of 45.0-49.9, adult ([employer]); Osteoporosis; Chronic fatigue; and History of thrombosis on their pertinent problem list. Pain Assessment: Severity of Chronic pain is reported as a 9 /10. Location: Back Lower/lateral right leg. Onset: More than a month ago. Quality: Constant, Sharp, Stabbing. Timing: Constant. Modifying factor(s): medications, moving, heat, hot baths. Vitals:  height is 4' 7" (1.397 m) and weight is 185 lb (83.9 kg). Her temporal temperature is 97 F (36.1 C) (abnormal). Her blood pressure is 138/69 and her pulse is 83. Her respiration is 16 and oxygen saturation is 98%.   Reason for encounter: medication management.   No change in medical history since last visit.  Patient's pain is at baseline.  Patient continues multimodal pain  regimen as prescribed.  States that it provides pain relief and improvement in functional status. At the patient's last visit, her gabapentin was increased to 600 mg 3 times a day. Isn't noticing a huge difference Is having increased right lumbar radicular pain flare.  She is already finished a steroid taper for this.  Discussed right lumbar epidural steroid injection.  Patient states that she wants to defer at this time.  She can call us if she wants to proceed with this in the future.   Pharmacotherapy Assessment  Analgesic: Norco 7.5 mg every 8 hours daily as needed, quantity 90/month; MME equals 22.5   Monitoring: [place] PMP: PDMP reviewed during this encounter.       Pharmacotherapy: No side-effects or adverse reactions reported. Compliance: No problems identified. Effectiveness: Clinically acceptable.  UDS:  Summary  Date Value Ref Range Status  11/02/2020 Note  Final    Comment:    ==================================================================== [employer] [facility] 13 (MW) ==================================================================== Test                             Result       Flag       Units  Drug Present and Declared for Prescription Verification   Hydrocodone                    4211         EXPECTED   ng/mg creat   Hydromorphone                  177          EXPECTED   ng/mg creat   Dihydrocodeine  679          EXPECTED   ng/mg creat   Norhydrocodone                 >3448        EXPECTED   ng/mg creat    Sources of hydrocodone include scheduled prescription medications.    Hydromorphone, dihydrocodeine and norhydrocodone are expected    metabolites of hydrocodone. Hydromorphone and dihydrocodeine are    also available as scheduled prescription medications.  ==================================================================== Test                      Result    Flag   Units      Ref Range   Creatinine              145              mg/dL       >=20 ==================================================================== Declared Medications:  The flagging and interpretation on this report are based on the  following declared medications.  Unexpected results may arise from  inaccuracies in the declared medications.   **Note: The testing scope of this panel includes these medications:   Hydrocodone (Norco)   **Note: The testing scope of this panel does not include the  following reported medications:   Acetaminophen (Norco)  Albuterol  Albuterol (Combivent)  Atorvastatin (Lipitor)  Azelastine (Astelin)  Budesonide (Symbicort)  Cyclobenzaprine (Flexeril)  Dabigatran (Pradaxa)  Fluticasone (Flonase)  Folic Acid  Formoterol (Symbicort)  Gabapentin (Neurontin)  Hydroxychloroquine (Plaquenil)  Ipratropium (Combivent)  Letrozole (Femara)  Methotrexate  Omeprazole (Prilosec)  Ondansetron (Zofran)  Probiotic  Tiotropium  Topiramate (Topamax)  Trazodone (Desyrel)  Vitamin C  Vitamin D3 ==================================================================== For clinical consultation, please call [phone]. ====================================================================       ROS  Constitutional: Denies any fever or chills Gastrointestinal: No reported hemesis, hematochezia, vomiting, or acute GI distress Musculoskeletal:  Low back pain with radiation to right lower extremity, Neurological: No reported episodes of acute onset apraxia, aphasia, dysarthria, agnosia, amnesia, paralysis, loss of coordination, or loss of consciousness  Medication Review  HYDROcodone-acetaminophen, Ipratropium-Albuterol, Tiotropium Bromide Monohydrate, atorvastatin, azelastine, benzonatate, budesonide-formoterol, cholecalciferol, cyclobenzaprine, dabigatran, fluticasone, folic acid, gabapentin, hydroxychloroquine, letrozole, methotrexate, omeprazole, ondansetron, topiramate, traZODone, and vitamin C  History Review  Allergy: Ms.  Victoria [name] is allergic to other, sodium hypochlorite, tape, codeine, flagyl [metronidazole], levofloxacin, and sulfa antibiotics. Drug: [name]  reports no history of drug use. Alcohol:  reports current alcohol use. Tobacco:  reports that she quit smoking about 21 years ago. Her smoking use included cigarettes. She has a 30.00 pack-year smoking history. She has never used smokeless tobacco. Social: [name]  reports that she quit smoking about 21 years ago. Her smoking use included cigarettes. She has a 30.00 pack-year smoking history. She has never used smokeless tobacco. She reports current alcohol use. She reports that she does not use drugs. Medical:  has a past medical history of Allergy, Asthma, Cancer (HCC), Collagen vascular disease (HCC), COPD (chronic obstructive pulmonary disease) (HCC), Diverticulitis, Dysplastic nevus (02/12/2021), Dysplastic nevus (02/12/2021), Fibromyalgia, GERD (gastroesophageal reflux disease), Headache, Hearing impaired person, left, History of dysplastic nevus (02/12/2021), IBS (irritable bowel syndrome), Lupus (HCC), Mitral valve disorder, MS (multiple sclerosis) (HCC), Neuromuscular disorder (HCC), Neuropathy, Osteoarthritis, Osteoporosis (05/12/2019), Personal history of radiation therapy (2017), Rheumatoid arthritis (HCC), and Seizures (HCC). Surgical: [name]  has a past surgical history that includes Breast surgery; Abdominal hysterectomy; Appendectomy; Cholecystectomy; Patent ductus arterious repair; Total hip arthroplasty (  Right); Reconstruction tendon pulley hand (Left); Esophagogastroduodenoscopy (egd) with propofol (N/A, 07/07/2019); Colonoscopy with propofol (N/A, 07/07/2019); Breast lumpectomy (Left, 2017); Breast excisional biopsy (Left, 2017); Breast excisional biopsy (Left, 2004); and Direct laryngoscopy (N/A, 09/22/2020). Family: family history includes Arthritis in her brother; COPD in her mother; Diabetes in her brother; Stroke in her brother and  father.  Laboratory Chemistry Profile   Renal Lab Results  Component Value Date   BUN 14 01/17/2021   CREATININE 0.85 01/17/2021   GFRAA >60 02/17/2020   GFRNONAA >60 01/17/2021     Hepatic Lab Results  Component Value Date   AST 27 01/17/2021   ALT 21 01/17/2021   ALBUMIN 4.0 01/17/2021   ALKPHOS 54 01/17/2021   LIPASE 35 01/17/2021     Electrolytes Lab Results  Component Value Date   NA 143 01/17/2021   K 4.4 01/17/2021   CL 111 01/17/2021   CALCIUM 9.7 01/17/2021     Bone No results found for: VD25OH, VD125OH2TOT, VD3125OH2, VD2125OH2, 25OHVITD1, 25OHVITD2, 25OHVITD3, TESTOFREE, TESTOSTERONE   Inflammation (CRP: Acute Phase) (ESR: Chronic Phase) Lab Results  Component Value Date   CRP <0.8 09/12/2018   ESRSEDRATE 16 09/12/2018   LATICACIDVEN 0.6 10/11/2020       Note: Above Lab results reviewed.  Recent Imaging Review  DG Bone Density EXAM: DUAL X-RAY ABSORPTIOMETRY (DXA) FOR BONE MINERAL DENSITY  IMPRESSION: Your patient Kimm [name] completed a BMD test on 11/29/2020 using the Lunar iDXA DXA System (software version: 14.10) manufactured by [employer] LUNAR. The following summarizes the results of our evaluation. Technologist: MTB PATIENT BIOGRAPHICAL: Name: Victoria [name], Victoria Patient ID: [id number] Birth Date: 10/22/1960 Height: 55.0 in. Gender: Female Exam Date: 11/29/2020 Weight: 189.0 lbs. Indications: Lupus, Hip Replacement, Oophorectomy Bilateral, Hysterectomy, Rheumatoid Arthritis, COPD, Caucasian, Asthma, History of Breast Cancer, fibromyalgia Fractures: Ribs Treatments: Albuterol, Calcium, Letrozole, Vitamin D DENSITOMETRY RESULTS: Site         Region     Measured Date Measured Age WHO Classification Young Adult T-score BMD         %Change vs. Previous Significant Change (*) AP Spine L1-L4 11/29/2020 57.3 Osteopenia -2.1 0.933 g/cm2 - -  Left Femur Neck 11/29/2020 57.3 Osteoporosis -2.9 0.636 g/cm2 -3.5% - Left Femur Neck  07/23/2018 54.9 Osteoporosis -2.7 0.659 g/cm2 - -  Left Femur Total 11/29/2020 57.3 Osteopenia -1.4 0.832 g/cm2 -0.5% - Left Femur Total 07/23/2018 54.9 Osteopenia -1.4 0.836 g/cm2 - -  Left Forearm Radius 33% 11/29/2020 57.3 Normal -0.4 0.837 g/cm2 - - ASSESSMENT: The BMD measured at Femur Neck is 0.636 g/cm2 with a T-score of -2.9. This patient is considered osteoporotic according to World Health Organization (WHO) criteria. The scan quality is good. Right Femur was excluded due to surgical hardware.  World Health Organization (WHO) criteria for post-menopausal, Caucasian Women: Normal:                   T-score at or above -1 SD Osteopenia/low bone mass: T-score between -1 and -2.5 SD Osteoporosis:             T-score at or below -2.5 SD  RECOMMENDATIONS: 1. All patients should optimize calcium and vitamin D intake. 2. Consider FDA-approved medical therapies in postmenopausal women and men aged 50 years and older, based on the following: a. A hip or vertebral(clinical or morphometric) fracture b. T-score < -2.5 at the femoral neck or spine after appropriate evaluation to exclude secondary causes c. Low bone mass (T-score between -1.0 and -2.5 at the   femoral neck or spine) and a 10-year probability of a hip fracture > 3% or a 10-year probability of a major osteoporosis-related fracture > 20% based on the [place]-adapted WHO algorithm 3. Clinician judgment and/or patient preferences may indicate treatment for people with 10-year fracture probabilities above or below these levels FOLLOW-UP: People with diagnosed cases of osteoporosis or at high risk for fracture should have regular bone mineral density tests. For patients eligible for Medicare, routine testing is allowed once every 2 years. The testing frequency can be increased to one year for patients who have rapidly progressing disease, those who are receiving or discontinuing medical therapy to restore bone mass, or have  additional risk factors. I have reviewed this report, and agree with the above findings.  [facility]  Electronically Signed   By: Kerby Moors M.D.   On: 11/29/2020 13:43 Note: Reviewed        Physical Exam  General appearance: Well nourished, well developed, and well hydrated. In no apparent acute distress Mental status: Alert, oriented x 3 (person, place, & time)       Respiratory: No evidence of acute respiratory distress Eyes: PERLA Vitals: BP 138/69 (BP Location: Right Arm, Patient Position: Sitting, Cuff Size: Large)   Pulse 83   Temp (!) 97 F (36.1 C) (Temporal)   Resp 16   Ht 4' 7" (1.397 m)   Wt 185 lb (83.9 kg)   SpO2 98%   BMI 43.00 kg/m  BMI: Estimated body mass index is 43 kg/m as calculated from the following:   Height as of this encounter: 4' 7" (1.397 m).   Weight as of this encounter: 185 lb (83.9 kg). Ideal: Patient must be at least 60 in tall to calculate ideal body weight   Lumbar Exam  Skin & Axial Inspection: No masses, redness, or swelling Alignment: Symmetrical Functional ROM: Pain restricted ROM affecting both sides Stability: No instability detected Muscle Tone/Strength: Functionally intact. No obvious neuro-muscular anomalies detected. Sensory (Neurological): Musculoskeletal pain pattern and dermatomal pain pattern   Gait & Posture Assessment  Ambulation: Patient ambulates using a cane Gait: Limited. Using assistive device to ambulate Posture: Difficulty standing up straight, due to pain    Lower Extremity Exam      Side: Right lower extremity   Side: Left lower extremity  Stability: No instability observed           Stability: No instability observed          Skin & Extremity Inspection: Evidence of prior arthroplastic surgery   Skin & Extremity Inspection: Skin color, temperature, and hair growth are WNL. No peripheral edema or cyanosis. No masses, redness, swelling, asymmetry, or associated skin lesions. No contractures.   Functional ROM: Pain restricted ROM for all joints of the lower extremity           Functional ROM: Pain restricted ROM                  Muscle Tone/Strength: Functionally intact. No obvious neuro-muscular anomalies detected.   Muscle Tone/Strength: Functionally intact. No obvious neuro-muscular anomalies detected.  Sensory (Neurological): Dermatomal pain pattern         Sensory (Neurological): Unimpaired        DTR: Patellar: 0: absent Achilles: deferred today Plantar: deferred today   DTR: Patellar: 0: absent Achilles: deferred today Plantar: deferred today  Palpation: No palpable anomalies   Palpation: No palpable anomalies    Assessment   Status Diagnosis  Controlled Controlled Controlled 1.  Chronic pain syndrome   2. Lumbar radiculopathy   3. Primary osteoarthritis of right hip   4. Seronegative arthritis   5. Polyarthralgia   6. Fibromyalgia        Plan of Care   Victoria [name] has a current medication list which includes the following long-term medication(s): atorvastatin, azelastine, budesonide-formoterol, dabigatran, fluticasone, gabapentin, omeprazole, tiotropium bromide monohydrate, topiramate, and trazodone.  Pharmacotherapy (Medications Ordered): Meds ordered this encounter  Medications   HYDROcodone-acetaminophen (NORCO) 7.5-325 MG tablet    Sig: Take 1 tablet by mouth 3 (three) times daily as needed for severe pain. Must last 30 days    Dispense:  90 tablet    Refill:  0    Chronic Pain: STOP Act (Not applicable) Fill 1 day early if closed on refill date. Avoid benzodiazepines within 8 hours of opioids   HYDROcodone-acetaminophen (NORCO) 7.5-325 MG tablet    Sig: Take 1 tablet by mouth 3 (three) times daily as needed for severe pain. Must last 30 days    Dispense:  90 tablet    Refill:  0    Chronic Pain: STOP Act (Not applicable) Fill 1 day early if closed on refill date. Avoid benzodiazepines within 8 hours of opioids   HYDROcodone-acetaminophen  (NORCO) 7.5-325 MG tablet    Sig: Take 1 tablet by mouth 3 (three) times daily as needed for severe pain. Must last 30 days    Dispense:  90 tablet    Refill:  0    Chronic Pain: STOP Act (Not applicable) Fill 1 day early if closed on refill date. Avoid benzodiazepines within 8 hours of opioids   Continue gabapentin as prescribed, no refills needed  Follow-up plan:   Return in about 3 months (around 06/17/2021) for Medication Management, in person.   Recent Visits Date Type Provider Dept  12/21/20 Office Visit Victoria Santa, MD [facility]  Showing recent visits within past 90 days and meeting all other requirements Today's Visits Date Type Provider Dept  03/17/21 Office Visit Victoria Santa, MD [facility]  Showing today's visits and meeting all other requirements Future Appointments Date Type Provider Dept  06/14/21 Appointment Victoria Santa, MD [facility]  Showing future appointments within next 90 days and meeting all other requirements I discussed the assessment and treatment plan with the patient. The patient was provided an opportunity to ask questions and all were answered. The patient agreed with the plan and demonstrated an understanding of the instructions.  Patient advised to call back or seek an in-person evaluation if the symptoms or condition worsens.  Duration of encounter: 30 minutes.  Note by: Victoria Santa, MD Date: 03/17/2021; Time: 1:52 PM

## 2021-03-18 ENCOUNTER — Inpatient Hospital Stay: Payer: Medicare Other

## 2021-03-18 MED ORDER — HEPARIN SOD (PORK) LOCK FLUSH 100 UNIT/ML IV SOLN
INTRAVENOUS | Status: AC
  Filled 2021-03-18: qty 5

## 2021-03-18 MED ORDER — HEPARIN SOD (PORK) LOCK FLUSH 100 UNIT/ML IV SOLN
500.0000 [IU] | Freq: Once | INTRAVENOUS | Status: DC
  Filled 2021-03-18: qty 5

## 2021-03-18 MED ORDER — SODIUM CHLORIDE 0.9% FLUSH
10.0000 mL | INTRAVENOUS | Status: DC | PRN
  Filled 2021-03-18: qty 10

## 2021-03-30 ENCOUNTER — Encounter: Payer: Self-pay | Admitting: Oncology

## 2021-03-30 ENCOUNTER — Inpatient Hospital Stay: Payer: Medicare Other

## 2021-03-30 ENCOUNTER — Inpatient Hospital Stay: Payer: Medicare Other | Attending: Oncology | Admitting: Oncology

## 2021-03-30 VITALS — BP 109/62 | HR 95 | Temp 96.9°F | Resp 18 | Wt 192.0 lb

## 2021-03-30 DIAGNOSIS — Z86000 Personal history of in-situ neoplasm of breast: Secondary | ICD-10-CM

## 2021-03-30 DIAGNOSIS — Z79811 Long term (current) use of aromatase inhibitors: Secondary | ICD-10-CM | POA: Insufficient documentation

## 2021-03-30 DIAGNOSIS — M810 Age-related osteoporosis without current pathological fracture: Secondary | ICD-10-CM | POA: Diagnosis not present

## 2021-03-30 DIAGNOSIS — M069 Rheumatoid arthritis, unspecified: Secondary | ICD-10-CM | POA: Diagnosis not present

## 2021-03-30 DIAGNOSIS — D0512 Intraductal carcinoma in situ of left breast: Secondary | ICD-10-CM | POA: Insufficient documentation

## 2021-03-30 DIAGNOSIS — Z923 Personal history of irradiation: Secondary | ICD-10-CM | POA: Insufficient documentation

## 2021-03-30 DIAGNOSIS — M818 Other osteoporosis without current pathological fracture: Secondary | ICD-10-CM | POA: Diagnosis present

## 2021-03-30 DIAGNOSIS — M329 Systemic lupus erythematosus, unspecified: Secondary | ICD-10-CM | POA: Insufficient documentation

## 2021-03-30 DIAGNOSIS — Z95828 Presence of other vascular implants and grafts: Secondary | ICD-10-CM

## 2021-03-30 LAB — COMPREHENSIVE METABOLIC PANEL
ALT: 23 U/L (ref 0–44)
AST: 24 U/L (ref 15–41)
Albumin: 3.7 g/dL (ref 3.5–5.0)
Alkaline Phosphatase: 73 U/L (ref 38–126)
Anion gap: 5 (ref 5–15)
BUN: 14 mg/dL (ref 6–20)
CO2: 26 mmol/L (ref 22–32)
Calcium: 8.9 mg/dL (ref 8.9–10.3)
Chloride: 105 mmol/L (ref 98–111)
Creatinine, Ser: 0.79 mg/dL (ref 0.44–1.00)
GFR, Estimated: >60 mL/min (ref >60)
Glucose, Bld: 132 mg/dL — ABNORMAL HIGH (ref 70–99)
Potassium: 3.4 mmol/L — ABNORMAL LOW (ref 3.5–5.1)
Sodium: 136 mmol/L (ref 135–145)
Total Bilirubin: 0.6 mg/dL (ref 0.3–1.2)
Total Protein: 6.5 g/dL (ref 6.5–8.1)

## 2021-03-30 LAB — CBC WITH DIFFERENTIAL/PLATELET
Abs Immature Granulocytes: 0.01 10*3/uL (ref 0.00–0.07)
Basophils Absolute: 0 10*3/uL (ref 0.0–0.1)
Basophils Relative: 1 %
Eosinophils Absolute: 0 10*3/uL (ref 0.0–0.5)
Eosinophils Relative: 0 %
HCT: 36.4 % (ref 36.0–46.0)
Hemoglobin: 11.9 g/dL — ABNORMAL LOW (ref 12.0–15.0)
Immature Granulocytes: 0 %
Lymphocytes Relative: 27 %
Lymphs Abs: 1 10*3/uL (ref 0.7–4.0)
MCH: 29.6 pg (ref 26.0–34.0)
MCHC: 32.7 g/dL (ref 30.0–36.0)
MCV: 90.5 fL (ref 80.0–100.0)
Monocytes Absolute: 0.3 10*3/uL (ref 0.1–1.0)
Monocytes Relative: 7 %
Neutro Abs: 2.4 10*3/uL (ref 1.7–7.7)
Neutrophils Relative %: 65 %
Platelets: 123 10*3/uL — ABNORMAL LOW (ref 150–400)
RBC: 4.02 MIL/uL (ref 3.87–5.11)
RDW: 13.8 % (ref 11.5–15.5)
WBC: 3.8 10*3/uL — ABNORMAL LOW (ref 4.0–10.5)
nRBC: 0 % (ref 0.0–0.2)

## 2021-03-30 MED ORDER — ZOLEDRONIC ACID 4 MG/100ML IV SOLN
4.0000 mg | Freq: Once | INTRAVENOUS | Status: AC
  Administered 2021-03-30: 4 mg via INTRAVENOUS
  Filled 2021-03-30: qty 100

## 2021-03-30 MED ORDER — SODIUM CHLORIDE 0.9% FLUSH
10.0000 mL | Freq: Once | INTRAVENOUS | Status: AC
  Administered 2021-03-30: 10 mL via INTRAVENOUS
  Filled 2021-03-30: qty 10

## 2021-03-30 MED ORDER — HEPARIN SOD (PORK) LOCK FLUSH 100 UNIT/ML IV SOLN
INTRAVENOUS | Status: AC
  Administered 2021-03-30: 500 [IU] via INTRAVENOUS
  Filled 2021-03-30: qty 5

## 2021-03-30 MED ORDER — SODIUM CHLORIDE 0.9 % IV SOLN
Freq: Once | INTRAVENOUS | Status: AC
  Filled 2021-03-30: qty 250

## 2021-03-30 MED ORDER — HEPARIN SOD (PORK) LOCK FLUSH 100 UNIT/ML IV SOLN
500.0000 [IU] | Freq: Once | INTRAVENOUS | Status: AC
  Filled 2021-03-30: qty 5

## 2021-03-30 NOTE — Progress Notes (Signed)
Patient here for oncology follow-up appointment, concerns of pre-cancerous mole removal, diarrhea and tremors

## 2021-03-30 NOTE — Progress Notes (Signed)
Hematology/Oncology follow up note [facility] [phone] [phone]   Patient Care Team: Kirk Ruths, MD as PCP - General (Internal Medicine) Earlie Server, MD as Consulting Physician (Oncology)  REFERRING PROVIDER: Kirk Ruths, MD CHIEF COMPLAINTS/REASON FOR VISIT:  Follow up for DCIS HISTORY OF PRESENTING ILLNESS:  Victoria [name] is a  60 y.o.  female with PMH listed below who was referred to me for evaluation of history of breast cancer Patient moved from [place]. Also report having left lower extremity blood clot 3 years ago, started on Eliquis for a month and developed pulmonary embolism.  Switch to Pradaxa.  She also had IVC filter.  After she moved to [place], she was temporarily off Pradaxa.  Currently back on anticoagulation. She presented to emergency room on 06/25/2018 for evaluation of right arm pain, complaining about developing knots right upper extremity.  # DCIS:  I obtained previous oncology history from [place]. Medical records were reviewed by me.  Patient was diagnosed with left breast DCIS in 2017, s/p lumpectomy and radiation.  Initially was started on Arimidex however cannot tolerate and was switched to Letrozole since Dec 2017.   # rheumatoid arthritis/lupus follows up with Dr. Cydney Ok.  No rashes follow-up with Dr. Carlyn Reichert   # history of unprovoked VTE, with IVC filter,  recurrent, on Pradaxa # [place] left thigh showed most likely lipoma.  She has also established care with Rheumatology. started on Leflunomide '20mg'$  daily since March 2020.  Also has been on Plaquenil '200mg'$    # Chronic anemia and thrombocytopenia. Previous work-up includes normal LDH, folate and vitamin B12 level, negative hepatitis panel, HIV negative.  Negative SPEP-[facility].  Normal immature platelet fraction indicating underproduction. # [id number] pneumonia in December 2020 # 11/27/2019 unilateral  left diagnostic mammogram done on  for evaluation of self palpated mass.  Study showed no mammographic or sonographic evidence of malignancy in the lower outer quadrant of left breast.   INTERVAL HISTORY [name] is a 60 y.o. female who has above history reviewed by me today presents for follow up visit for management of history of DCIS.  Patient takes letrozole,  Patient has Mediport, flush every 8 weeks.   Patient reports tolerating letrozole with manageable side effects. Patient has had diarrhea today. Tremor of her head. Review of Systems  Constitutional:  Positive for fatigue. Negative for appetite change, chills and fever.  HENT:   Negative for hearing loss and voice change.   Eyes:  Negative for eye problems.  Respiratory:  Negative for chest tightness, cough and shortness of breath.   Cardiovascular:  Negative for chest pain.  Gastrointestinal:  Negative for abdominal distention, abdominal pain and blood in stool.  Endocrine: Positive for hot flashes.  Genitourinary:  Negative for difficulty urinating and frequency.   Musculoskeletal:  Positive for arthralgias. Negative for back pain and myalgias.  Skin:  Negative for itching and rash.  Neurological:  Negative for extremity weakness.  Hematological:  Negative for adenopathy.  Psychiatric/Behavioral:  Negative for confusion.   Precancerous mole removal recently. 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   right inf lat buttock at buttocks crease dysplastic compuond nevus with moderate atypia close to margin   Fibromyalgia    GERD (gastroesophageal reflux disease)    Headache     Migraines   Hearing impaired person, left    bilateral hearing aids   History of dysplastic nevus 02/12/2021   right inf buttock no residual dysplastic nevus margins free   IBS (irritable bowel syndrome)    Lupus ([facility])    Mitral valve disorder    MS (multiple sclerosis) ([place])    Neuromuscular disorder ([place])    Neuropathy    Osteoarthritis    Osteoporosis 05/12/2019   Personal history of radiation therapy 2017   left    Rheumatoid arthritis ([place])    hands and legs   Seizures ([place])    no seizures for 10 years    SURGICAL HISTORY: Past Surgical History:  Procedure Laterality Date   ABDOMINAL HYSTERECTOMY     APPENDECTOMY     BREAST EXCISIONAL BIOPSY Left 2017   lumpectomy with radation   BREAST EXCISIONAL BIOPSY Left 2004   neg surgical bx    BREAST LUMPECTOMY Left 2017   DCIS   BREAST SURGERY     CHOLECYSTECTOMY     COLONOSCOPY WITH PROPOFOL N/A 07/07/2019   Procedure: COLONOSCOPY WITH PROPOFOL;  Surgeon: Toledo, Benay Pike, MD;  Location: [facility] END[facility]COPY;  Service: Gastroenterology;  Laterality: N/A;   DIRECT LARYNGOSCOPY N/A 09/22/2020   Procedure: SUSPENSION MICRODIRECT LARYNGOSCOPY WITH BIOPSY;  Surgeon: Carloyn Manner, MD;  Location: [facility];  Service: ENT;  Laterality: N/A;  has port a cath needs accessed   ESOPHAGOGASTRODUODENOSCOPY (EGD) WITH PROPOFOL N/A 07/07/2019   Procedure: ESOPHAGOGASTRODUODENOSCOPY (EGD) WITH PROPOFOL;  Surgeon: Toledo, Benay Pike, MD;  Location: [facility] ENDOSCOPY;  Service: Gastroenterology;  Laterality: N/A;   PATENT DUCTUS ARTERIOUS REPAIR     RECONSTRUCTION TENDON PULLEY HAND Left    TOTAL HIP ARTHROPLASTY Right     SOCIAL HISTORY: Social History   Socioeconomic History   Marital status: Widowed    Spouse name: Not on file   Number of children: 1   Years of education: Not on file   Highest education level: Some college, no degree  Occupational History   Not on file  Tobacco Use   Smoking status: Former     Packs/day: 2.00    Years: 15.00    Pack years: 30.00    Types: Cigarettes    Quit date: 06/27/1999    Years since quitting: 21.7   Smokeless tobacco: Never  Vaping Use   Vaping Use: Never used  Substance and Sexual Activity   Alcohol use: Yes    Comment: Rarely - approx 6 beers once a month   Drug use: Never   Sexual activity: Not Currently  Other Topics Concern   Not on file  Social History Narrative   Not on file   Social Determinants of Health   Financial Resource Strain: Not on file  Food Insecurity: Not on file  Transportation Needs: Not on file  Physical Activity: Not on file  Stress: Not on file  Social Connections: Not on file  Intimate Partner Violence: Not on file    FAMILY HISTORY: Family History  Problem Relation Age of Onset   COPD Mother    Stroke Father    Arthritis Brother    Diabetes Brother    Stroke Brother    Breast cancer Neg Hx     ALLERGIES:  is  allergic to other, sodium hypochlorite, tape, codeine, flagyl [metronidazole], levofloxacin, and sulfa antibiotics.  MEDICATIONS:  Current Outpatient Medications  Medication Sig Dispense Refill   Ascorbic Acid (VITAMIN C) 1000 MG tablet Take 1,000 mg by mouth daily.     atorvastatin (LIPITOR) 40 MG tablet Take 40 mg by mouth daily.     azelastine (ASTELIN) 0.1 % nasal spray Place 2 sprays into both nostrils 2 (two) times daily.     benzonatate (TESSALON) 200 MG capsule TAKE 1 CAPSULE BY MOUTH 3 TIMES A DAY ASNEEDED FOR COUGH FOR UP TO SEVEN DAYS     budesonide-formoterol (SYMBICORT) 160-4.5 MCG/ACT inhaler Inhale 2 puffs into the lungs 2 (two) times daily. 1 Inhaler 12   cholecalciferol (VITAMIN D3) 25 MCG (1000 UNIT) tablet Take 1,000 Units by mouth daily.     cyclobenzaprine (FLEXERIL) 10 MG tablet TAKE 1 TABLET BY MOUTH TWICE A DAY AS NEEDED FOR MUSCLE SPASMS 40 tablet 0   dabigatran (PRADAXA) 150 MG CAPS capsule Take 1 capsule (150 mg total) by mouth 2 (two) times daily. 180 capsule 3    famotidine (PEPCID) 40 MG tablet Take by mouth.     fluticasone (FLONASE) 50 MCG/ACT nasal spray Place into both nostrils.     folic acid (FOLVITE) 1 MG tablet Take by mouth.     gabapentin (NEURONTIN) 300 MG capsule Take 1-2 capsules (300-600 mg total) by mouth 3 (three) times daily. 270 capsule 0   HYDROcodone-acetaminophen (NORCO) 7.5-325 MG tablet Take 1 tablet by mouth 3 (three) times daily as needed for severe pain. Must last 30 days 90 tablet 0   [START ON 04/16/2021] HYDROcodone-acetaminophen (NORCO) 7.5-325 MG tablet Take 1 tablet by mouth 3 (three) times daily as needed for severe pain. Must last 30 days 90 tablet 0   [START ON 05/16/2021] HYDROcodone-acetaminophen (NORCO) 7.5-325 MG tablet Take 1 tablet by mouth 3 (three) times daily as needed for severe pain. Must last 30 days 90 tablet 0   hydroxychloroquine (PLAQUENIL) 200 MG tablet Take 200 mg by mouth daily.     Ipratropium-Albuterol (COMBIVENT RESPIMAT) 20-100 MCG/ACT AERS respimat INHALE 2 PUFFS BY MOUTH INTO THE LUNGS EVERY 4 TIMES DAILY AS NEEDED FOR WHEEZING     letrozole (FEMARA) 2.5 MG tablet TAKE 1 TABLET BY MOUTH ONCE DAILY 30 tablet 3   methotrexate 2.5 MG tablet Take 2.5 mg by mouth once a week.     omeprazole (PRILOSEC) 40 MG capsule Take 1 capsule (40 mg total) by mouth 2 (two) times daily. 60 capsule 6   ondansetron (ZOFRAN) 4 MG tablet Take 1 tablet (4 mg total) by mouth every 8 (eight) hours as needed for up to 10 doses for nausea or vomiting. 20 tablet 0   Tiotropium Bromide Monohydrate (SPIRIVA RESPIMAT) 2.5 MCG/ACT AERS Inhale 2 puffs into the lungs every morning. 1 Inhaler 11   topiramate (TOPAMAX) 50 MG tablet Take 50 mg by mouth 2 (two) times daily.     traZODone (DESYREL) 50 MG tablet Take 3 tablets (150 mg total) by mouth at bedtime. 180 tablet 1   No current facility-administered medications for this visit.   Facility-Administered Medications Ordered in Other Visits  Medication Dose Route Frequency Provider  Last Rate Last Admin   sodium chloride flush (NS) 0.9 % injection 10 mL  10 mL Intravenous PRN Earlie Server, MD   10 mL at 01/22/19 1503     PHYSICAL EXAMINATION: ECOG PERFORMANCE STATUS: 1 - Symptomatic but completely ambulatory Vitals:   03/30/21  1312  BP: 109/62  Pulse: 95  Resp: 18  Temp: (!) 96.9 F (36.1 C)  SpO2: 100%   Filed Weights   03/30/21 1312  Weight: 192 lb (87.1 kg)    Physical Exam Constitutional:      General: She is not in acute distress.    Appearance: She is not diaphoretic.     Comments: Obese  HENT:     Head: Normocephalic and atraumatic.     Nose: Nose normal.     Mouth/Throat:     Pharynx: No oropharyngeal exudate.  Eyes:     General: No scleral icterus.    Pupils: Pupils are equal, round, and reactive to light.  Cardiovascular:     Rate and Rhythm: Normal rate and regular rhythm.     Heart sounds: No murmur heard. Pulmonary:     Effort: Pulmonary effort is normal. No respiratory distress.     Breath sounds: No rales.  Chest:     Chest wall: No tenderness.  Abdominal:     General: There is no distension.     Palpations: Abdomen is soft.     Tenderness: There is no abdominal tenderness.  Musculoskeletal:        General: Normal range of motion.     Cervical back: Normal range of motion and neck supple.  Skin:    General: Skin is warm and dry.     Findings: No erythema.  Neurological:     Mental Status: She is alert and oriented to person, place, and time.     Cranial Nerves: No cranial nerve deficit.     Motor: No abnormal muscle tone.     Coordination: Coordination normal.  Psychiatric:        Mood and Affect: Affect normal.  .   LABORATORY DATA:  I have reviewed the data as listed Lab Results  Component Value Date   WBC 3.8 (L) 03/30/2021   HGB 11.9 (L) 03/30/2021   HCT 36.4 03/30/2021   MCV 90.5 03/30/2021   PLT 123 (L) 03/30/2021   Recent Labs    10/11/20 1105 01/17/21 1018 03/30/21 1256  NA 141 143 136  K 3.1* 4.4  3.4*  CL 111 111 105  CO2 21* 23 26  GLUCOSE 89 93 132*  BUN '9 14 14  '$ CREATININE 0.69 0.85 0.79  CALCIUM 9.5 9.7 8.9  GFRNONAA >60 >60 >60  PROT 7.2 6.7 6.5  ALBUMIN 4.3 4.0 3.7  AST '27 27 24  '$ ALT '26 21 23  '$ ALKPHOS 65 54 73  BILITOT 0.6 1.1 0.6    Iron/TIBC/Ferritin/ %Sat No results found for: IRON, TIBC, FERRITIN, IRONPCTSAT   RADIOGRAPHIC STUDIES: I have personally reviewed the radiological images as listed and agreed with the findings in the report. US venous upper right extremity 06/04/2018  No evidence of DVT within the right upper extremity 07/09/2018 CXR  Interval resolution of subsegmental atelectasis or interstitial edema. Mild chronic bronchitic changes. No acute pneumonia nor pulmonary edema. Thoracic aortic atherosclerosis  No results found.   ASSESSMENT & PLAN:  1. History of ductal carcinoma in situ (DCIS) of breast   2. Aromatase inhibitor use   3. Osteoporosis, unspecified osteoporosis type, unspecified pathological fracture presence    history of left DICS.  S/p lumpectomy and RT.  Labs reviewed and discussed with patient Continue letrozole. She will finish 5 years of letrozole at the end of 2020.  Advised patient to stop after that Mammogram March 2023.  Osteoporosis,  Continue calcium  and vitamin D Proceed with Zometa today 11/29/2020, bone density showed left femur neck T- 2.9, slightly worse than 3 years ago. .. #Port-A-Cath in place, due to lack of peripheral access.  Continue port flush every 8 weeks #History of DVT and PE, patient is on Pradaxa, not managed by me. #Diarrhea, recommend patient to avoid artificial sweetener.  Encourage oral hydration.  Recommend patient to further discuss with primary care provider if symptoms persist.  Chronic autoimmune disorders/rheumatoid arthritis/lupus: Continue follow-up with rheumatology Dr. Posey Pronto..   Orders Placed This Encounter  Procedures   MM 3D SCREEN BREAST BILATERAL    Standing Status:   Future     Standing Expiration Date:   03/30/2022    Order Specific Question:   Reason for Exam (SYMPTOM  OR DIAGNOSIS REQUIRED)    Answer:   history of DCIS    Order Specific Question:   Preferred imaging location?    Answer:   [facility]    Order Specific Question:   Is the patient pregnant?    Answer:   No    All questions were answered. The patient knows to call the clinic with any problems questions or concerns.  Return of visit: 6 months.   Earlie Server, MD, PhD Hematology Oncology [place] at [facility] 03/30/2021

## 2021-03-30 NOTE — Patient Instructions (Signed)

## 2021-03-31 ENCOUNTER — Ambulatory Visit: Payer: Medicare Other | Admitting: Oncology

## 2021-03-31 ENCOUNTER — Other Ambulatory Visit: Payer: Medicare Other

## 2021-05-12 ENCOUNTER — Inpatient Hospital Stay: Payer: Medicare Other | Attending: Oncology

## 2021-05-12 DIAGNOSIS — Z452 Encounter for adjustment and management of vascular access device: Secondary | ICD-10-CM | POA: Insufficient documentation

## 2021-05-12 DIAGNOSIS — D0512 Intraductal carcinoma in situ of left breast: Secondary | ICD-10-CM | POA: Insufficient documentation

## 2021-05-12 DIAGNOSIS — Z95828 Presence of other vascular implants and grafts: Secondary | ICD-10-CM

## 2021-05-12 MED ORDER — SODIUM CHLORIDE 0.9% FLUSH
10.0000 mL | Freq: Once | INTRAVENOUS | Status: AC
  Administered 2021-05-12: 10 mL via INTRAVENOUS
  Filled 2021-05-12: qty 10

## 2021-05-12 MED ORDER — HEPARIN SOD (PORK) LOCK FLUSH 100 UNIT/ML IV SOLN
500.0000 [IU] | Freq: Once | INTRAVENOUS | Status: AC
  Administered 2021-05-12: 500 [IU] via INTRAVENOUS
  Filled 2021-05-12: qty 5

## 2021-06-14 ENCOUNTER — Encounter: Payer: Medicare Other | Admitting: Student in an Organized Health Care Education/Training Program

## 2021-06-17 ENCOUNTER — Other Ambulatory Visit: Payer: Self-pay | Admitting: Oncology

## 2021-06-17 DIAGNOSIS — Z95828 Presence of other vascular implants and grafts: Secondary | ICD-10-CM

## 2021-06-22 ENCOUNTER — Other Ambulatory Visit: Payer: Self-pay

## 2021-06-22 ENCOUNTER — Encounter: Payer: Self-pay | Admitting: Oncology

## 2021-06-22 ENCOUNTER — Ambulatory Visit (INDEPENDENT_AMBULATORY_CARE_PROVIDER_SITE_OTHER): Payer: Medicare Other | Admitting: Dermatology

## 2021-06-22 DIAGNOSIS — Z1283 Encounter for screening for malignant neoplasm of skin: Secondary | ICD-10-CM | POA: Diagnosis not present

## 2021-06-22 DIAGNOSIS — L578 Other skin changes due to chronic exposure to nonionizing radiation: Secondary | ICD-10-CM | POA: Diagnosis not present

## 2021-06-22 DIAGNOSIS — Z86018 Personal history of other benign neoplasm: Secondary | ICD-10-CM

## 2021-06-22 DIAGNOSIS — D2272 Melanocytic nevi of left lower limb, including hip: Secondary | ICD-10-CM

## 2021-06-22 DIAGNOSIS — D225 Melanocytic nevi of trunk: Secondary | ICD-10-CM

## 2021-06-22 DIAGNOSIS — D492 Neoplasm of unspecified behavior of bone, soft tissue, and skin: Secondary | ICD-10-CM

## 2021-06-22 DIAGNOSIS — D2261 Melanocytic nevi of right upper limb, including shoulder: Secondary | ICD-10-CM | POA: Diagnosis not present

## 2021-06-22 DIAGNOSIS — L814 Other melanin hyperpigmentation: Secondary | ICD-10-CM | POA: Diagnosis not present

## 2021-06-22 NOTE — Patient Instructions (Addendum)

## 2021-06-22 NOTE — Progress Notes (Signed)
Follow-Up Visit   Subjective  Victoria [name] is a 60 y.o. female who presents for the following: check moles back (Some slightly irregular, hx of dysplastic nevi). The patient has several moles and spots to be evaluated.  She is concerned about all areas because she has had history of dysplastic nevus.  She would like these areas evaluated.  She is concerned about skin cancer.  The following portions of the chart were reviewed this encounter and updated as appropriate:   Tobacco  Allergies  Meds  Problems  Med Hx  Surg Hx  Fam Hx      Review of Systems:  No other skin or systemic complaints except as noted in HPI or Assessment and Plan.  Objective  Well appearing patient in no apparent distress; mood and affect are within normal limits.  A focused examination was performed including back. Relevant physical exam findings are noted in the Assessment and Plan.  L medial calf 1.1 cm Irregular brown macule     R inf med scapula Irregular brown macule 0.7cm     R mid back lat 1.1cm irregular brown macule     R low back lat 1.1cm irregular brown macule      Assessment & Plan   Melanocytic Nevi - Tan-brown and/or pink-flesh-colored symmetric macules and papules - Benign appearing on exam today - Observation - Call clinic for new or changing moles - Recommend daily use of broad spectrum spf 30+ sunscreen to sun-exposed areas.   Neoplasm of skin (4) L medial calf  Epidermal / dermal shaving  Lesion diameter (cm):  1.1 Informed consent: discussed and consent obtained   Timeout: patient name, date of birth, surgical site, and procedure verified   Procedure prep:  Patient was prepped and draped in usual sterile fashion Prep type:  Isopropyl alcohol Anesthesia: the lesion was anesthetized in a standard fashion   Anesthetic:  1% lidocaine w/ epinephrine 1-100,000 buffered w/ 8.4% NaHCO3 Instrument used: flexible razor blade   Hemostasis achieved with:  pressure, aluminum chloride and electrodesiccation   Outcome: patient tolerated procedure well   Post-procedure details: sterile dressing applied and wound care instructions given   Dressing type: bandage and bacitracin    Specimen 4 - Surgical pathology Differential Diagnosis: D48.5 Nevus vs Dysplastic nevus  Check Margins: No 1.1 cm Irregular brown macule  R inf med scapula  Epidermal / dermal shaving  Lesion diameter (cm):  0.7 Informed consent: discussed and consent obtained   Timeout: patient name, date of birth, surgical site, and procedure verified   Procedure prep:  Patient was prepped and draped in usual sterile fashion Prep type:  Isopropyl alcohol Anesthesia: the lesion was anesthetized in a standard fashion   Anesthetic:  1% lidocaine w/ epinephrine 1-100,000 buffered w/ 8.4% NaHCO3 Instrument used: flexible razor blade   Hemostasis achieved with: pressure, aluminum chloride and electrodesiccation   Outcome: patient tolerated procedure well   Post-procedure details: sterile dressing applied and wound care instructions given   Dressing type: bandage and bacitracin    Specimen 1 - Surgical pathology Differential Diagnosis: D48.5 Nevus vs Dysplastic nevus  Check Margins: No Irregular brown macule 0.7cm  R mid back lat  Epidermal / dermal shaving  Lesion diameter (cm):  1.1 Informed consent: discussed and consent obtained   Timeout: patient name, date of birth, surgical site, and procedure verified   Procedure prep:  Patient was prepped and draped in usual sterile fashion Prep type:  Isopropyl alcohol Anesthesia: the lesion was anesthetized  in a standard fashion   Anesthetic:  1% lidocaine w/ epinephrine 1-100,000 buffered w/ 8.4% NaHCO3 Instrument used: flexible razor blade   Hemostasis achieved with: pressure, aluminum chloride and electrodesiccation   Outcome: patient tolerated procedure well   Post-procedure details: sterile dressing applied and wound care  instructions given   Dressing type: bandage and bacitracin    Specimen 2 - Surgical pathology Differential Diagnosis: D48.5 Nevus vs Dysplastic nevus  Check Margins: No 1.1cm irregular brown macule  R low back lat  Epidermal / dermal shaving  Lesion diameter (cm):  1.1 Informed consent: discussed and consent obtained   Timeout: patient name, date of birth, surgical site, and procedure verified   Procedure prep:  Patient was prepped and draped in usual sterile fashion Prep type:  Isopropyl alcohol Anesthesia: the lesion was anesthetized in a standard fashion   Anesthetic:  1% lidocaine w/ epinephrine 1-100,000 buffered w/ 8.4% NaHCO3 Instrument used: flexible razor blade   Hemostasis achieved with: pressure, aluminum chloride and electrodesiccation   Outcome: patient tolerated procedure well   Post-procedure details: sterile dressing applied and wound care instructions given   Dressing type: bandage and bacitracin    Specimen 3 - Surgical pathology Differential Diagnosis: D48.5 Nevus vs Dysplastic nevus  Check Margins: No 1.1cm irregular brown macule  Skin cancer screening  Lentigines - Scattered tan macules - Due to sun exposure - Benign-appering, observe - Recommend daily broad spectrum sunscreen SPF 30+ to sun-exposed areas, reapply every 2 hours as needed. - Call for any changes  Actinic Damage - chronic, secondary to cumulative UV radiation exposure/sun exposure over time - diffuse scaly erythematous macules with underlying dyspigmentation - Recommend daily broad spectrum sunscreen SPF 30+ to sun-exposed areas, reapply every 2 hours as needed.  - Recommend staying in the shade or wearing long sleeves, sun glasses (UVA+UVB protection) and wide brim hats (4-inch brim around the entire circumference of the hat). - Call for new or changing lesions.  Return in about 6 months (around 12/20/2021) for TBSE, Hx of Dysplastic nevi.  I, Othelia Pulling, RMA, am acting as scribe  for Sarina Ser, MD . Documentation: I have reviewed the above documentation for accuracy and completeness, and I agree with the above.  Sarina Ser, MD

## 2021-06-27 ENCOUNTER — Telehealth: Payer: Self-pay

## 2021-06-27 NOTE — Telephone Encounter (Signed)
Patient advised of BX results. Two surgeries scheduled. Advised patient we can schedule suture removal and do shave to calf at third appointment. This is to be scheduled once she checks out from second surgery.

## 2021-06-27 NOTE — Telephone Encounter (Signed)
-----   Message from Ralene Bathe, MD sent at 06/23/2021  5:32 PM EST ----- Diagnosis 1. Skin , right inf med scapula DYSPLASTIC COMPOUND NEVUS WITH SEVERE ATYPIA, PERIPHERAL MARGIN INVOLVED 2. Skin , right mid back lat DYSPLASTIC COMPOUND NEVUS WITH SEVERE ATYPIA, PERIPHERAL MARGIN INVOLVED 3. Skin , right low back lat DYSPLASTIC NEVUS WITH MODERATE TO SEVERE ATYPIA, CLOSE TO MARGIN, SEE DESCRIPTION 4. Skin , left medial calf DYSPLASTIC COMPOUND NEVUS WITH SEVERE ATYPIA, PERIPHERAL AND DEEP MARGINS INVOLVED  1 & 2 - both severe dysplastic Schedule surgery x 2 (2 separate surgery times) 3- Moderate to severe dysplastic Margins clear, but "close to margin" Recheck next visit  May need additional procedure 4- Severe dysplastic Plan shave removal at next visit

## 2021-07-04 ENCOUNTER — Telehealth: Payer: Self-pay | Admitting: Oncology

## 2021-07-04 NOTE — Telephone Encounter (Signed)
Pt called to reschedule her appt. Please give her a call back at [phone]

## 2021-07-05 ENCOUNTER — Encounter: Payer: Self-pay | Admitting: Dermatology

## 2021-07-07 ENCOUNTER — Inpatient Hospital Stay: Payer: Medicare Other | Attending: Oncology

## 2021-07-08 NOTE — Telephone Encounter (Signed)
Attempted to reach pt, left VM to call back to reschedule.

## 2021-07-12 ENCOUNTER — Telehealth: Payer: Self-pay | Admitting: Oncology

## 2021-07-12 NOTE — Telephone Encounter (Signed)
Pt called to set up an appt. Please give her a call back at [phone]

## 2021-07-15 ENCOUNTER — Inpatient Hospital Stay: Payer: Medicare Other | Attending: Oncology

## 2021-07-15 ENCOUNTER — Other Ambulatory Visit: Payer: Self-pay

## 2021-07-15 DIAGNOSIS — Z452 Encounter for adjustment and management of vascular access device: Secondary | ICD-10-CM | POA: Insufficient documentation

## 2021-07-15 DIAGNOSIS — D0512 Intraductal carcinoma in situ of left breast: Secondary | ICD-10-CM | POA: Insufficient documentation

## 2021-07-15 DIAGNOSIS — Z95828 Presence of other vascular implants and grafts: Secondary | ICD-10-CM

## 2021-07-15 MED ORDER — HEPARIN SOD (PORK) LOCK FLUSH 100 UNIT/ML IV SOLN
500.0000 [IU] | Freq: Once | INTRAVENOUS | Status: AC
  Administered 2021-07-15: 500 [IU] via INTRAVENOUS
  Filled 2021-07-15: qty 5

## 2021-07-15 MED ORDER — SODIUM CHLORIDE 0.9% FLUSH
10.0000 mL | Freq: Once | INTRAVENOUS | Status: AC
  Administered 2021-07-15: 10 mL via INTRAVENOUS
  Filled 2021-07-15: qty 10

## 2021-08-09 ENCOUNTER — Encounter: Payer: Self-pay | Admitting: Oncology

## 2021-08-16 ENCOUNTER — Other Ambulatory Visit: Payer: Self-pay

## 2021-08-16 ENCOUNTER — Telehealth: Payer: Self-pay

## 2021-08-16 ENCOUNTER — Ambulatory Visit (INDEPENDENT_AMBULATORY_CARE_PROVIDER_SITE_OTHER): Payer: Medicare Other | Admitting: Dermatology

## 2021-08-16 ENCOUNTER — Encounter: Payer: Self-pay | Admitting: Dermatology

## 2021-08-16 DIAGNOSIS — D235 Other benign neoplasm of skin of trunk: Secondary | ICD-10-CM

## 2021-08-16 DIAGNOSIS — D239 Other benign neoplasm of skin, unspecified: Secondary | ICD-10-CM

## 2021-08-16 MED ORDER — MUPIROCIN 2 % EX OINT: 1.0000 "application " | TOPICAL_OINTMENT | Freq: Every day | CUTANEOUS | 1 refills | Status: DC

## 2021-08-16 NOTE — Patient Instructions (Signed)

## 2021-08-16 NOTE — Progress Notes (Signed)
Follow-Up Visit   Subjective  Victoria [name] is a 61 y.o. female who presents for the following: Bx proven severe dysplastic nevi (R inf med scapula, R mid back lat, pt presents for surgery of one today).  The following portions of the chart were reviewed this encounter and updated as appropriate:   Tobacco   Allergies   Meds   Problems   Med Hx   Surg Hx   Fam Hx      Review of Systems:  No other skin or systemic complaints except as noted in HPI or Assessment and Plan.  Objective  Well appearing patient in no apparent distress; mood and affect are within normal limits.  A focused examination was performed including back, left medial calf. Relevant physical exam findings are noted in the Assessment and Plan.    L medial calf Pink bx site  R inf medial scapula Pink bx site 1.1 x 0.8cm  R mid back lat Pink bx site  R low back lat Pink bx site   Assessment & Plan  Dysplastic nevus (4) L medial calf; R low back lat; R mid back lat; R inf medial scapula  Severe, bx proven: -L medial calf, plan shave removal on last post op appointment -R inf medial scapula, excised today -R mid back lat, plan excision on f/u  Moderate to severe bx proven: -R low back lat, recheck on f/u  mupirocin ointment (BACTROBAN) 2 % - R inf medial scapula Apply 1 application topically daily. Qd to excision site  Skin excision - R inf medial scapula  Lesion length (cm):  1.1 Lesion width (cm):  0.8 Margin per side (cm):  0.2 Total excision diameter (cm):  1.5 Informed consent: discussed and consent obtained   Timeout: patient name, date of birth, surgical site, and procedure verified   Procedure prep:  Patient was prepped and draped in usual sterile fashion Prep type:  Isopropyl alcohol and povidone-iodine Anesthesia: the lesion was anesthetized in a standard fashion   Anesthetic:  1% lidocaine w/ epinephrine 1-100,000 buffered w/ 8.4% NaHCO3 (6.0cc lido w/ epi, 3.0cc bupivicaine, Total =  9cc) Instrument used: #15 blade   Hemostasis achieved with: pressure   Hemostasis achieved with comment:  Electrocautery Outcome: patient tolerated procedure well with no complications   Post-procedure details: sterile dressing applied and wound care instructions given   Dressing type: pressure dressing and bacitracin (Mupirocin)    Skin repair - R inf medial scapula Complexity:  Complex Final length (cm):  4 Reason for type of repair: reduce tension to allow closure, reduce the risk of dehiscence, infection, and necrosis, reduce subcutaneous dead space and avoid a hematoma, allow closure of the large defect, preserve normal anatomy, preserve normal anatomical and functional relationships and enhance both functionality and cosmetic results   Undermining: area extensively undermined   Undermining comment:  Undermining Defect 1.5cm Subcutaneous layers (deep stitches):  Suture size:  2-0 Suture type: Vicryl (polyglactin 910)   Subcutaneous suture technique: Inverted Dermal. Fine/surface layer approximation (top stitches):  Suture size:  3-0 Suture type: nylon   Stitches: horizontal mattress   Suture removal (days):  7 Hemostasis achieved with: pressure Outcome: patient tolerated procedure well with no complications   Post-procedure details: sterile dressing applied and wound care instructions given   Dressing type: pressure dressing and bacitracin (Mupirocin)    Specimen 1 - Surgical pathology Differential Diagnosis: D48.5 Bx proven severe dysplastic nevus  Check Margins: yes Pink bx site 1.1 x 0.8cm [id number]  Return in about 1 week (around 08/23/2021) for suture removal and surgery.  I, Othelia Pulling, RMA, am acting as scribe for Sarina Ser, MD . Documentation: I have reviewed the above documentation for accuracy and completeness, and I agree with the above.  Sarina Ser, MD

## 2021-08-16 NOTE — Telephone Encounter (Signed)
Left pt message to call if any problems after today's surgery./sh 

## 2021-08-18 ENCOUNTER — Encounter: Payer: Self-pay | Admitting: Dermatology

## 2021-08-18 ENCOUNTER — Other Ambulatory Visit: Payer: Self-pay | Admitting: Otolaryngology

## 2021-08-18 ENCOUNTER — Other Ambulatory Visit (HOSPITAL_COMMUNITY): Payer: Self-pay | Admitting: Otolaryngology

## 2021-08-18 DIAGNOSIS — R202 Paresthesia of skin: Secondary | ICD-10-CM

## 2021-08-19 ENCOUNTER — Encounter: Payer: Self-pay | Admitting: Oncology

## 2021-08-23 ENCOUNTER — Telehealth: Payer: Self-pay

## 2021-08-23 ENCOUNTER — Other Ambulatory Visit: Payer: Self-pay

## 2021-08-23 ENCOUNTER — Ambulatory Visit (INDEPENDENT_AMBULATORY_CARE_PROVIDER_SITE_OTHER): Payer: Medicare Other | Admitting: Dermatology

## 2021-08-23 ENCOUNTER — Encounter: Payer: Self-pay | Admitting: Dermatology

## 2021-08-23 DIAGNOSIS — D492 Neoplasm of unspecified behavior of bone, soft tissue, and skin: Secondary | ICD-10-CM

## 2021-08-23 DIAGNOSIS — D485 Neoplasm of uncertain behavior of skin: Secondary | ICD-10-CM

## 2021-08-23 DIAGNOSIS — D225 Melanocytic nevi of trunk: Secondary | ICD-10-CM | POA: Diagnosis not present

## 2021-08-23 DIAGNOSIS — Z4802 Encounter for removal of sutures: Secondary | ICD-10-CM

## 2021-08-23 DIAGNOSIS — D239 Other benign neoplasm of skin, unspecified: Secondary | ICD-10-CM

## 2021-08-23 NOTE — Telephone Encounter (Signed)
Spoke with patient regarding surgery. She is doing fine/hd 

## 2021-08-23 NOTE — Progress Notes (Signed)
Follow-Up Visit   Subjective  Victoria [name] is a 61 y.o. female who presents for the following: Procedure (Biopsy proven severe dysplastic nevus of right mid back lat - Excise today) and Other (Post op - severe dysplastic nevus margins free - suture removal).  The following portions of the chart were reviewed this encounter and updated as appropriate:   Tobacco   Allergies   Meds   Problems   Med Hx   Surg Hx   Fam Hx      Review of Systems:  No other skin or systemic complaints except as noted in HPI or Assessment and Plan.  Objective  Well appearing patient in no apparent distress; mood and affect are within normal limits.  A focused examination was performed including back. Relevant physical exam findings are noted in the Assessment and Plan.  Left medial calf, Right inf med scapula Healing excision site of right inf med scapula. Healing biopsy site of left medial calf and right low back lat.  Right mid back lat Healing biopsy site   Assessment & Plan  Dysplastic nevus (2) Left medial calf; Right inf med scapula  Encounter for Removal of Sutures - Incision site at the right inf med scapula is clean, dry and intact - Wound cleansed, sutures removed, wound cleansed and steri strips applied.  - Discussed pathology results showing dysplastic nevus margins free  - Patient advised to keep steri-strips dry until they fall off. - Scars remodel for a full year. - Once steri-strips fall off, patient can apply over-the-counter silicone scar cream each night to help with scar remodeling if desired. - Patient advised to call with any concerns or if they notice any new or changing lesions.    Biopsy proven severe dysplastic nevus of left medial calf - plan shave removal at post op.  Biopsy proven mod-severe dysplastic nevus of right low back lat - recheck on follow up.  Neoplasm of uncertain behavior of skin Right mid back lat  Skin excision  Lesion length (cm):  1.5 Lesion  width (cm):  1 Margin per side (cm):  0.2 Total excision diameter (cm):  1.9 Informed consent: discussed and consent obtained   Timeout: patient name, date of birth, surgical site, and procedure verified   Procedure prep:  Patient was prepped and draped in usual sterile fashion Prep type:  Isopropyl alcohol and povidone-iodine Anesthesia: the lesion was anesthetized in a standard fashion   Anesthetic:  1% lidocaine w/ epinephrine 1-100,000 buffered w/ 8.4% NaHCO3 Instrument used: #15 blade   Hemostasis achieved with: pressure   Hemostasis achieved with comment:  Electrocautery Outcome: patient tolerated procedure well with no complications   Post-procedure details: sterile dressing applied and wound care instructions given   Dressing type: bandage and pressure dressing (mupirocin)    Skin repair Complexity:  Complex Final length (cm):  4 Reason for type of repair: reduce tension to allow closure, reduce the risk of dehiscence, infection, and necrosis, reduce subcutaneous dead space and avoid a hematoma, allow closure of the large defect, preserve normal anatomy, preserve normal anatomical and functional relationships and enhance both functionality and cosmetic results   Undermining: area extensively undermined   Undermining comment:  Undermining defect 1.4 cm Subcutaneous layers (deep stitches):  Suture size:  2-0 Suture type: Vicryl (polyglactin 910)   Subcutaneous suture technique: inverted dermal. Fine/surface layer approximation (top stitches):  Suture size:  3-0 Suture type: nylon   Stitches: simple running   Suture removal (days):  7 Hemostasis  achieved with: suture and pressure Outcome: patient tolerated procedure well with no complications   Post-procedure details: sterile dressing applied and wound care instructions given   Dressing type: bandage and pressure dressing (mupirocin)   Additional details:  Mupirocin daily with dressing changes  Specimen 1 - Surgical  pathology Differential Diagnosis: Biopsy proven severe dysplastic nevus Check Margins: No [id number]   Return in about 1 week (around 08/30/2021) for suture removal.  I, Ashok Cordia, CMA, am acting as scribe for Sarina Ser, MD . Documentation: I have reviewed the above documentation for accuracy and completeness, and I agree with the above.  Sarina Ser, MD

## 2021-08-23 NOTE — Patient Instructions (Signed)

## 2021-08-24 ENCOUNTER — Telehealth: Payer: Self-pay | Admitting: Oncology

## 2021-08-24 NOTE — Telephone Encounter (Signed)
Patient already has appt in [facility]

## 2021-08-24 NOTE — Telephone Encounter (Signed)
Pt called to make an appt with Dr.Yu. call back at [phone]

## 2021-08-25 ENCOUNTER — Encounter: Payer: Self-pay | Admitting: Oncology

## 2021-08-27 ENCOUNTER — Other Ambulatory Visit: Payer: Self-pay | Admitting: Otolaryngology

## 2021-08-27 ENCOUNTER — Other Ambulatory Visit: Payer: Self-pay

## 2021-08-27 ENCOUNTER — Ambulatory Visit
Admission: RE | Admit: 2021-08-27 | Discharge: 2021-08-27 | Disposition: A | Discharge status: Home / Self Care | Payer: Medicare Other | Source: Ambulatory Visit | Attending: Otolaryngology | Admitting: Otolaryngology

## 2021-08-27 DIAGNOSIS — I6782 Cerebral ischemia: Secondary | ICD-10-CM | POA: Insufficient documentation

## 2021-08-27 DIAGNOSIS — H748X1 Other specified disorders of right middle ear and mastoid: Secondary | ICD-10-CM | POA: Insufficient documentation

## 2021-08-27 DIAGNOSIS — R202 Paresthesia of skin: Secondary | ICD-10-CM | POA: Diagnosis present

## 2021-08-27 DIAGNOSIS — Z853 Personal history of malignant neoplasm of breast: Secondary | ICD-10-CM | POA: Diagnosis not present

## 2021-08-27 MED ORDER — GADOBUTROL 1 MMOL/ML IV SOLN
7.5000 mL | Freq: Once | INTRAVENOUS | Status: AC | PRN
  Administered 2021-08-27: 7.5 mL via INTRAVENOUS

## 2021-08-30 ENCOUNTER — Encounter: Payer: Self-pay | Admitting: Dermatology

## 2021-08-30 ENCOUNTER — Ambulatory Visit (INDEPENDENT_AMBULATORY_CARE_PROVIDER_SITE_OTHER): Payer: Medicare Other | Admitting: Dermatology

## 2021-08-30 ENCOUNTER — Other Ambulatory Visit: Payer: Self-pay

## 2021-08-30 DIAGNOSIS — D239 Other benign neoplasm of skin, unspecified: Secondary | ICD-10-CM

## 2021-08-30 DIAGNOSIS — D2372 Other benign neoplasm of skin of left lower limb, including hip: Secondary | ICD-10-CM | POA: Diagnosis not present

## 2021-08-30 DIAGNOSIS — Z86018 Personal history of other benign neoplasm: Secondary | ICD-10-CM

## 2021-08-30 NOTE — Patient Instructions (Addendum)

## 2021-08-30 NOTE — Progress Notes (Signed)
° °  Follow-Up Visit   Subjective  Shanece Cochrane is a 61 y.o. female who presents for the following: suture removal/post op (Patient is here today for suture removal of margins free severely dysplastic nevus on the R mid back lat. ) and Dysplastic nevus (Bx proven severely dysplastic nevus of the L med calf. Patient is here today for shave removal. ).  The following portions of the chart were reviewed this encounter and updated as appropriate:   Tobacco   Allergies   Meds   Problems   Med Hx   Surg Hx   Fam Hx      Review of Systems:  No other skin or systemic complaints except as noted in HPI or Assessment and Plan.  Objective  Well appearing patient in no apparent distress; mood and affect are within normal limits.  A focused examination was performed including the trunk and extremities. Relevant physical exam findings are noted in the Assessment and Plan.  L med calf Pink biopsy site.   R mid back lat Healing excision site.   Assessment & Plan  Dysplastic nevus L med calf  Bx proven severely dysplastic nevus   Epidermal / dermal shaving - L med calf  Lesion diameter (cm):  1.4 Informed consent: discussed and consent obtained   Timeout: patient name, date of birth, surgical site, and procedure verified   Procedure prep:  Patient was prepped and draped in usual sterile fashion Prep type:  Isopropyl alcohol Anesthesia: the lesion was anesthetized in a standard fashion   Anesthetic:  1% lidocaine w/ epinephrine 1-100,000 buffered w/ 8.4% NaHCO3 Instrument used: flexible razor blade   Hemostasis achieved with: pressure, aluminum chloride and electrodesiccation   Outcome: patient tolerated procedure well   Post-procedure details: sterile dressing applied and wound care instructions given   Dressing type: bandage and petrolatum    Specimen 1 - Surgical pathology Differential Diagnosis: D48.5 bx proven severely dysplastic nevus shave removal performed today.  [id number] Check  Margins: Yes  History of dysplastic nevus R mid back lat  Encounter for Removal of Sutures - Incision site at the R mid back lat is clean, dry and intact - Wound cleansed, sutures removed, wound cleansed and steri strips applied.  - Discussed pathology results showing margins free severely dysplastic nevus.  - Patient advised to keep steri-strips dry until they fall off. - Scars remodel for a full year. - Once steri-strips fall off, patient can apply over-the-counter silicone scar cream each night to help with scar remodeling if desired. - Patient advised to call with any concerns or if they notice any new or changing lesions.  R low back lat clear today.   Return for appointment as scheduled.  Luther Redo, CMA, am acting as scribe for Sarina Ser, MD . Documentation: I have reviewed the above documentation for accuracy and completeness, and I agree with the above.  Sarina Ser, MD

## 2021-09-02 ENCOUNTER — Telehealth: Payer: Self-pay

## 2021-09-02 NOTE — Telephone Encounter (Signed)
Patient informed of pathology results 

## 2021-09-02 NOTE — Telephone Encounter (Signed)
-----   Message from Ralene Bathe, MD sent at 09/01/2021  1:34 PM EST ----- Diagnosis Skin , left med calf SCATTERED ATYPICAL MELANOCYTES, DIAGNOSTIC NEVUS NOT SEEN, MARGINS FREE, SEE DESCRIPTION  Site of Severe Dysplastic Margins free Recheck next visit

## 2021-09-04 ENCOUNTER — Encounter: Payer: Self-pay | Admitting: Dermatology

## 2021-09-27 ENCOUNTER — Other Ambulatory Visit: Payer: Self-pay | Admitting: *Deleted

## 2021-09-27 DIAGNOSIS — Z86000 Personal history of in-situ neoplasm of breast: Secondary | ICD-10-CM

## 2021-09-27 DIAGNOSIS — M810 Age-related osteoporosis without current pathological fracture: Secondary | ICD-10-CM

## 2021-09-27 DIAGNOSIS — Z79811 Long term (current) use of aromatase inhibitors: Secondary | ICD-10-CM

## 2021-09-27 DIAGNOSIS — D500 Iron deficiency anemia secondary to blood loss (chronic): Secondary | ICD-10-CM

## 2021-10-03 ENCOUNTER — Encounter: Payer: Self-pay | Admitting: Oncology

## 2021-10-03 ENCOUNTER — Ambulatory Visit
Admission: RE | Admit: 2021-10-03 | Discharge: 2021-10-03 | Disposition: A | Discharge status: Home / Self Care | Payer: Medicare Other | Source: Ambulatory Visit | Attending: Oncology | Admitting: Oncology

## 2021-10-03 ENCOUNTER — Inpatient Hospital Stay: Payer: Medicare Other

## 2021-10-03 ENCOUNTER — Other Ambulatory Visit: Payer: Self-pay

## 2021-10-03 ENCOUNTER — Inpatient Hospital Stay: Payer: Medicare Other | Attending: Oncology | Admitting: Oncology

## 2021-10-03 VITALS — BP 107/69 | HR 69 | Temp 97.1°F | Wt 184.0 lb

## 2021-10-03 VITALS — Resp 18

## 2021-10-03 DIAGNOSIS — G35 Multiple sclerosis: Secondary | ICD-10-CM | POA: Insufficient documentation

## 2021-10-03 DIAGNOSIS — Z79811 Long term (current) use of aromatase inhibitors: Secondary | ICD-10-CM

## 2021-10-03 DIAGNOSIS — M810 Age-related osteoporosis without current pathological fracture: Secondary | ICD-10-CM

## 2021-10-03 DIAGNOSIS — R051 Acute cough: Secondary | ICD-10-CM

## 2021-10-03 DIAGNOSIS — Z95828 Presence of other vascular implants and grafts: Secondary | ICD-10-CM

## 2021-10-03 DIAGNOSIS — Z86000 Personal history of in-situ neoplasm of breast: Secondary | ICD-10-CM

## 2021-10-03 DIAGNOSIS — D0512 Intraductal carcinoma in situ of left breast: Secondary | ICD-10-CM | POA: Insufficient documentation

## 2021-10-03 DIAGNOSIS — Z79899 Other long term (current) drug therapy: Secondary | ICD-10-CM | POA: Diagnosis not present

## 2021-10-03 DIAGNOSIS — D500 Iron deficiency anemia secondary to blood loss (chronic): Secondary | ICD-10-CM

## 2021-10-03 LAB — COMPREHENSIVE METABOLIC PANEL
ALT: 13 U/L (ref 0–44)
AST: 18 U/L (ref 15–41)
Albumin: 3.5 g/dL (ref 3.5–5.0)
Alkaline Phosphatase: 64 U/L (ref 38–126)
Anion gap: 9 (ref 5–15)
BUN: 16 mg/dL (ref 6–20)
CO2: 23 mmol/L (ref 22–32)
Calcium: 9.1 mg/dL (ref 8.9–10.3)
Chloride: 106 mmol/L (ref 98–111)
Creatinine, Ser: 0.86 mg/dL (ref 0.44–1.00)
GFR, Estimated: >60 mL/min (ref >60)
Glucose, Bld: 123 mg/dL — ABNORMAL HIGH (ref 70–99)
Potassium: 3.7 mmol/L (ref 3.5–5.1)
Sodium: 138 mmol/L (ref 135–145)
Total Bilirubin: <0.1 mg/dL — ABNORMAL LOW (ref 0.3–1.2)
Total Protein: 6.6 g/dL (ref 6.5–8.1)

## 2021-10-03 LAB — CBC WITH DIFFERENTIAL/PLATELET
Abs Immature Granulocytes: 0.03 10*3/uL (ref 0.00–0.07)
Basophils Absolute: 0.1 10*3/uL (ref 0.0–0.1)
Basophils Relative: 1 %
Eosinophils Absolute: 0 10*3/uL (ref 0.0–0.5)
Eosinophils Relative: 0 %
HCT: 37.6 % (ref 36.0–46.0)
Hemoglobin: 12.1 g/dL (ref 12.0–15.0)
Immature Granulocytes: 1 %
Lymphocytes Relative: 19 %
Lymphs Abs: 1.1 10*3/uL (ref 0.7–4.0)
MCH: 29.4 pg (ref 26.0–34.0)
MCHC: 32.2 g/dL (ref 30.0–36.0)
MCV: 91.3 fL (ref 80.0–100.0)
Monocytes Absolute: 0.3 10*3/uL (ref 0.1–1.0)
Monocytes Relative: 5 %
Neutro Abs: 4.2 10*3/uL (ref 1.7–7.7)
Neutrophils Relative %: 74 %
Platelets: 167 10*3/uL (ref 150–400)
RBC: 4.12 MIL/uL (ref 3.87–5.11)
RDW: 13.9 % (ref 11.5–15.5)
WBC: 5.6 10*3/uL (ref 4.0–10.5)
nRBC: 0 % (ref 0.0–0.2)

## 2021-10-03 MED ORDER — SODIUM CHLORIDE 0.9 % IV SOLN
Freq: Once | INTRAVENOUS | Status: AC
  Filled 2021-10-03: qty 250

## 2021-10-03 MED ORDER — HEPARIN SOD (PORK) LOCK FLUSH 100 UNIT/ML IV SOLN
500.0000 [IU] | Freq: Once | INTRAVENOUS | Status: AC
  Filled 2021-10-03: qty 5

## 2021-10-03 MED ORDER — SODIUM CHLORIDE 0.9% FLUSH
10.0000 mL | INTRAVENOUS | Status: DC | PRN
  Administered 2021-10-03: 10 mL via INTRAVENOUS
  Filled 2021-10-03: qty 10

## 2021-10-03 MED ORDER — ZOLEDRONIC ACID 4 MG/100ML IV SOLN
4.0000 mg | Freq: Once | INTRAVENOUS | Status: AC
  Administered 2021-10-03: 4 mg via INTRAVENOUS
  Filled 2021-10-03: qty 100

## 2021-10-03 MED ORDER — HEPARIN SOD (PORK) LOCK FLUSH 100 UNIT/ML IV SOLN
INTRAVENOUS | Status: AC
  Administered 2021-10-03: 500 [IU] via INTRAVENOUS
  Filled 2021-10-03: qty 5

## 2021-10-03 NOTE — Progress Notes (Signed)
Patient here for follow up. Patient does not voice any new concerns.

## 2021-10-03 NOTE — Patient Instructions (Signed)
[facility] [place]   Discharge Instructions: Thank you for choosing [facility] to provide your oncology and hematology care.  If you have a lab appointment with the [facility], please go directly to the [facility] and check in at the registration area.   Wear comfortable clothing and clothing appropriate for easy access to any Portacath or PICC line.   We strive to give you quality time with your provider. You may need to reschedule your appointment if you arrive late (15 or more minutes).  Arriving late affects you and other patients whose appointments are after yours.  Also, if you miss three or more appointments without notifying the office, you may be dismissed from the clinic at the providers discretion.      For prescription refill requests, have your pharmacy contact our office and allow 72 hours for refills to be completed.    Today you received the following: [place].      BELOW ARE SYMPTOMS THAT SHOULD BE REPORTED IMMEDIATELY: *FEVER GREATER THAN 100.4 F (38 C) OR HIGHER *CHILLS OR SWEATING *NAUSEA AND VOMITING THAT IS NOT CONTROLLED WITH YOUR NAUSEA MEDICATION *UNUSUAL SHORTNESS OF BREATH *UNUSUAL BRUISING OR BLEEDING *URINARY PROBLEMS (pain or burning when urinating, or frequent urination) *BOWEL PROBLEMS (unusual diarrhea, constipation, pain near the anus) TENDERNESS IN MOUTH AND THROAT WITH OR WITHOUT PRESENCE OF ULCERS (sore throat, sores in mouth, or a toothache) UNUSUAL RASH, SWELLING OR PAIN  UNUSUAL VAGINAL DISCHARGE OR ITCHING   Items with * indicate a potential emergency and should be followed up as soon as possible or go to the Emergency Department if any problems should occur.  Should you have questions after your visit or need to cancel or reschedule your appointment, please contact [facility] [place]  Dept: [phone]  and follow the prompts.  Office hours are 8:00 a.m. to 4:30 p.m.  Monday - Friday. Please note that voicemails left after 4:00 p.m. may not be returned until the following business day.  We are closed weekends and major holidays. You have access to a nurse at all times for urgent questions. Please call the main number to the clinic Dept: [phone] and follow the prompts.  For any non-urgent questions, you may also contact your provider using MyChart. We now offer e-Visits for anyone 80 and older to request care online for non-urgent symptoms. For details visit [email or web address].   Also download the MyChart app! Go to the app store, search "MyChart", open the app, select [facility], and log in with your MyChart username and password.  Due to Covid, a mask is required upon entering the hospital/clinic. If you do not have a mask, one will be given to you upon arrival. For doctor visits, patients may have 1 support person aged 23 or older with them. For treatment visits, patients cannot have anyone with them due to current Covid guidelines and our immunocompromised population.

## 2021-10-03 NOTE — Progress Notes (Signed)
Hematology/Oncology Progress note [phone] [phone]      Patient Care Team: Normajean Glasgow, MD as PCP - General Earlie Server, MD as Consulting Physician (Oncology)  REFERRING PROVIDER: Normajean Glasgow, MD CHIEF COMPLAINTS/REASON FOR VISIT:  Follow up for DCIS HISTORY OF PRESENTING ILLNESS:   Patient moved from [place]. History of left lower extremity blood clot in 2016  started on Eliquis for a month and developed pulmonary embolism.  Switch to Pradaxa.  She also had IVC filter.  After she moved to [place], she was temporarily off Pradaxa.  Currently back on anticoagulation. She presented to emergency room on 06/25/2018 for evaluation of right arm pain, complaining about developing knots right upper extremity.  # DCIS:  I obtained previous oncology history from [place]. Medical records were reviewed by me.  Patient was diagnosed with left breast DCIS in 2017, s/p lumpectomy and radiation.  Initially was started on Arimidex however cannot tolerate and was switched to Letrozole since Dec 2017.   # rheumatoid arthritis/lupus follows up with Dr. Cydney Ok.  No rashes follow-up with Dr. Carlyn Reichert   # history of unprovoked VTE, with IVC filter,  recurrent, on Pradaxa # [place] left thigh showed most likely lipoma.  She has also established care with Rheumatology. started on Leflunomide 20mg  daily since March 2020.  Also has been on Plaquenil 200mg    # Chronic anemia and thrombocytopenia. Previous work-up includes normal LDH, folate and vitamin B12 level, negative hepatitis panel, HIV negative.  Negative SPEP-[facility].  Normal immature platelet fraction indicating underproduction. # [id number] pneumonia in December 2020 # 11/27/2019 unilateral left diagnostic mammogram done on  for evaluation of self palpated mass.  Study showed no mammographic or sonographic evidence of malignancy in the lower outer quadrant of left  breast.   INTERVAL HISTORY Victoria [name] is a 61 y.o. female who has above history reviewed by me today presents for follow up visit for management of history of DCIS.  Patient takes letrozole,  Patient has Mediport, flush every 8 weeks.   Patient has been on aromatase inhibitor with letrozole.  Today she reports some congestion, she attributes to seasonal allergy.+ Cough   Review of Systems  Constitutional:  Positive for fatigue. Negative for appetite change, chills and fever.  HENT:   Negative for hearing loss and voice change.   Eyes:  Negative for eye problems.  Respiratory:  Negative for chest tightness, cough and shortness of breath.   Cardiovascular:  Negative for chest pain.  Gastrointestinal:  Negative for abdominal distention, abdominal pain and blood in stool.  Endocrine: Positive for hot flashes.  Genitourinary:  Negative for difficulty urinating and frequency.   Musculoskeletal:  Positive for arthralgias. Negative for back pain and myalgias.  Skin:  Negative for itching and rash.  Neurological:  Negative for extremity weakness.  Hematological:  Negative for adenopathy.  Psychiatric/Behavioral:  Negative for confusion.    MEDICAL HISTORY:  Past Medical History:  Diagnosis Date   Allergy    Asthma    Cancer ([place])    Breast Left, DCIS 2018   Collagen vascular disease ([place])    Lupus   COPD (chronic obstructive pulmonary disease) ([place])    Diverticulitis    Dysplastic nevus 02/12/2021   right inf lat buttock dysplastic compound nevus with moderate atypia, limited margins free   Dysplastic nevus 02/12/2021   right inf lat buttock at buttocks crease dysplastic compuond nevus with moderate atypia close to margin   Dysplastic nevus 06/22/2021  Severe, R inf med scapula, Excised 08/16/21   Dysplastic nevus 06/22/2021   Severe, R mid back lat, Excision scheduled   Dysplastic nevus 06/22/2021   Mod to Severe, R low back lat, recheck at next appt   Dysplastic nevus  06/22/2021   L med calf - severe, tx with shave removal   Fibromyalgia    GERD (gastroesophageal reflux disease)    Headache    Migraines   Hearing impaired person, left    bilateral hearing aids   History of dysplastic nevus 02/12/2021   right inf buttock no residual dysplastic nevus margins free   IBS (irritable bowel syndrome)    Lupus ([facility])    Mitral valve disorder    MS (multiple sclerosis) ([place])    Neuromuscular disorder ([place])    Neuropathy    Osteoarthritis    Osteoporosis 05/12/2019   Personal history of radiation therapy 2017   left    Rheumatoid arthritis ([place])    hands and legs   Seizures ([place])    no seizures for 10 years    SURGICAL HISTORY: Past Surgical History:  Procedure Laterality Date   ABDOMINAL HYSTERECTOMY     APPENDECTOMY     BREAST EXCISIONAL BIOPSY Left 2017   lumpectomy with radation   BREAST EXCISIONAL BIOPSY Left 2004   neg surgical bx    BREAST LUMPECTOMY Left 2017   DCIS   BREAST SURGERY     CHOLECYSTECTOMY     COLONOSCOPY WITH PROPOFOL N/A 07/07/2019   Procedure: COLONOSCOPY WITH PROPOFOL;  Surgeon: Toledo, Benay Pike, MD;  Location: [facility];  Service: Gastroenterology;  Laterality: N/A;   DIRECT LARYNGOSCOPY N/A 09/22/2020   Procedure: SUSPENSION MICRODIRECT LARYNGOSCOPY WITH BIOPSY;  Surgeon: Carloyn Manner, MD;  Location: [facility];  Service: ENT;  Laterality: N/A;  has port a cath needs accessed   ESOPHAGOGASTRODUODENOSCOPY (EGD) WITH PROPOFOL N/A 07/07/2019   Procedure: ESOPHAGOGASTRODUODENOSCOPY (EGD) WITH PROPOFOL;  Surgeon: Toledo, Benay Pike, MD;  Location: [facility];  Service: Gastroenterology;  Laterality: N/A;   PATENT DUCTUS ARTERIOUS REPAIR     RECONSTRUCTION TENDON PULLEY HAND Left    TOTAL HIP ARTHROPLASTY Right     SOCIAL HISTORY: Social History   Socioeconomic History   Marital status: Widowed    Spouse name: Not on file   Number of children: 1   Years of education: Not on file   Highest  education level: Some college, no degree  Occupational History   Not on file  Tobacco Use   Smoking status: Former    Packs/day: 2.00    Years: 15.00    Pack years: 30.00    Types: Cigarettes    Quit date: 06/27/1999    Years since quitting: 22.2   Smokeless tobacco: Never  Vaping Use   Vaping Use: Never used  Substance and Sexual Activity   Alcohol use: Yes    Comment: Rarely - approx 6 beers once a month   Drug use: Never   Sexual activity: Not Currently  Other Topics Concern   Not on file  Social History Narrative   Not on file   Social Determinants of Health   Financial Resource Strain: Not on file  Food Insecurity: Not on file  Transportation Needs: Not on file  Physical Activity: Not on file  Stress: Not on file  Social Connections: Not on file  Intimate Partner Violence: Not on file    FAMILY HISTORY: Family History  Problem Relation Age of Onset   COPD Mother  Stroke Father    Arthritis Brother    Diabetes Brother    Stroke Brother    Breast cancer Neg Hx     ALLERGIES:  is allergic to other, sodium hypochlorite, tape, codeine, flagyl [metronidazole], levofloxacin, and sulfa antibiotics.  MEDICATIONS:  Current Outpatient Medications  Medication Sig Dispense Refill   Ascorbic Acid (VITAMIN C) 1000 MG tablet Take 1,000 mg by mouth daily.     atorvastatin (LIPITOR) 40 MG tablet Take 40 mg by mouth daily.     azelastine (ASTELIN) 0.1 % nasal spray Place 2 sprays into both nostrils 2 (two) times daily.     benzonatate (TESSALON) 200 MG capsule TAKE 1 CAPSULE BY MOUTH 3 TIMES A DAY ASNEEDED FOR COUGH FOR UP TO SEVEN DAYS     budesonide-formoterol (SYMBICORT) 160-4.5 MCG/ACT inhaler Inhale 2 puffs into the lungs 2 (two) times daily. 1 Inhaler 12   cholecalciferol (VITAMIN D3) 25 MCG (1000 UNIT) tablet Take 1,000 Units by mouth daily.     cyclobenzaprine (FLEXERIL) 10 MG tablet TAKE 1 TABLET BY MOUTH TWICE A DAY AS NEEDED FOR MUSCLE SPASMS 40 tablet 0    dabigatran (PRADAXA) 150 MG CAPS capsule Take 1 capsule (150 mg total) by mouth 2 (two) times daily. 180 capsule 3   fluticasone (FLONASE) 50 MCG/ACT nasal spray Place into both nostrils.     hydroxychloroquine (PLAQUENIL) 200 MG tablet Take 200 mg by mouth daily.     Ipratropium-Albuterol (COMBIVENT RESPIMAT) 20-100 MCG/ACT AERS respimat INHALE 2 PUFFS BY MOUTH INTO THE LUNGS EVERY 4 TIMES DAILY AS NEEDED FOR WHEEZING     letrozole (FEMARA) 2.5 MG tablet TAKE 1 TABLET BY MOUTH ONCE DAILY 30 tablet 3   omeprazole (PRILOSEC) 40 MG capsule Take 1 capsule (40 mg total) by mouth 2 (two) times daily. 60 capsule 6   ondansetron (ZOFRAN) 4 MG tablet Take 1 tablet (4 mg total) by mouth every 8 (eight) hours as needed for up to 10 doses for nausea or vomiting. 20 tablet 0   Tiotropium Bromide Monohydrate (SPIRIVA RESPIMAT) 2.5 MCG/ACT AERS Inhale 2 puffs into the lungs every morning. 1 Inhaler 11   topiramate (TOPAMAX) 50 MG tablet Take 50 mg by mouth 2 (two) times daily.     traZODone (DESYREL) 50 MG tablet Take 3 tablets (150 mg total) by mouth at bedtime. 180 tablet 1   gabapentin (NEURONTIN) 300 MG capsule Take 1-2 capsules (300-600 mg total) by mouth 3 (three) times daily. 270 capsule 0   methotrexate 2.5 MG tablet Take 2.5 mg by mouth once a week. (Patient not taking: Reported on 10/03/2021)     No current facility-administered medications for this visit.   Facility-Administered Medications Ordered in Other Visits  Medication Dose Route Frequency Provider Last Rate Last Admin   sodium chloride flush (NS) 0.9 % injection 10 mL  10 mL Intravenous PRN Earlie Server, MD   10 mL at 01/22/19 1503     PHYSICAL EXAMINATION: ECOG PERFORMANCE STATUS: 1 - Symptomatic but completely ambulatory Vitals:   10/03/21 1308  BP: 107/69  Pulse: 69  Temp: (!) 97.1 F (36.2 C)   Filed Weights   10/03/21 1308  Weight: 184 lb (83.5 kg)    Physical Exam Constitutional:      General: She is not in acute  distress.    Appearance: She is not diaphoretic.     Comments: Obese  HENT:     Head: Normocephalic and atraumatic.     Nose: Nose normal.  Mouth/Throat:     Pharynx: No oropharyngeal exudate.  Eyes:     General: No scleral icterus.    Pupils: Pupils are equal, round, and reactive to light.  Cardiovascular:     Rate and Rhythm: Normal rate and regular rhythm.     Heart sounds: No murmur heard. Pulmonary:     Effort: Pulmonary effort is normal. No respiratory distress.     Breath sounds: No rales.  Chest:     Chest wall: No tenderness.  Abdominal:     General: There is no distension.     Palpations: Abdomen is soft.     Tenderness: There is no abdominal tenderness.  Musculoskeletal:        General: Normal range of motion.     Cervical back: Normal range of motion and neck supple.  Skin:    General: Skin is warm and dry.     Findings: No erythema.  Neurological:     Mental Status: She is alert and oriented to person, place, and time.     Cranial Nerves: No cranial nerve deficit.     Motor: No abnormal muscle tone.     Coordination: Coordination normal.  Psychiatric:        Mood and Affect: Affect normal.  .   LABORATORY DATA:  I have reviewed the data as listed Lab Results  Component Value Date   WBC 5.6 10/03/2021   HGB 12.1 10/03/2021   HCT 37.6 10/03/2021   MCV 91.3 10/03/2021   PLT 167 10/03/2021   Recent Labs    01/17/21 1018 03/30/21 1256 10/03/21 1252  NA 143 136 138  K 4.4 3.4* 3.7  CL 111 105 106  CO2 23 26 23   GLUCOSE 93 132* 123*  BUN 14 14 16   CREATININE 0.85 0.79 0.86  CALCIUM 9.7 8.9 9.1  GFRNONAA >60 >60 >60  PROT 6.7 6.5 6.6  ALBUMIN 4.0 3.7 3.5  AST 27 24 18   ALT 21 23 13   ALKPHOS 54 73 64  BILITOT 1.1 0.6 <0.1*    Iron/TIBC/Ferritin/ %Sat No results found for: IRON, TIBC, FERRITIN, IRONPCTSAT   RADIOGRAPHIC STUDIES: I have personally reviewed the radiological images as listed and agreed with the findings in the report. US  venous upper right extremity 06/04/2018  No evidence of DVT within the right upper extremity 07/09/2018 CXR  Interval resolution of subsegmental atelectasis or interstitial edema. Mild chronic bronchitic changes. No acute pneumonia nor pulmonary edema. Thoracic aortic atherosclerosis  MR FACE/TRIGEMINAL WO/W CM  Result Date: 08/29/2021 CLINICAL DATA:  61 year old female with right face numbness for 3 months. No known injury or dental issues. History of left breast cancer. EXAM: MRI FACE TRIGEMINAL WITHOUT AND WITH CONTRAST TECHNIQUE: Multiplanar, multi-echo pulse sequences of the face and surrounding structures, including thin-slice imaging of the trigeminal nerves, were acquired before and after intravenous contrast administration. CONTRAST:  7.21mL GADAVIST GADOBUTROL 1 MMOL/ML IV SOLN COMPARISON:  Neck CT 09/02/2020.  Brain MRI 05/07/2020. FINDINGS: Post-contrast images are degraded by motion artifact despite repeated imaging attempts. Limited intracranial/Trigeminal nerves: No intracranial mass effect or ventriculomegaly. Patchy cerebral white matter T2 hyperintensity redemonstrated. Brainstem signal remains normal. Tiny chronic right cerebellar infarcts redemonstrated. Bilateral cisternal 5th nerve segments appear symmetric and normal. Bilateral Meckel's cave appear within normal limits and were normal on the 2021 MRI. Subtle asymmetric enhancement of the right cavernous sinus on series 16, image 15, but this is not correlated with cavernous sinus mass on the other sequences. And the bilateral V2  and V3 trunks appear to remain symmetric and normal. No intraorbital mass or enhancement identified. Infraorbital nerves appear symmetric and normal. Inferior alveolar nerves appear symmetric and normal. Vascular: Major intracranial vascular flow voids are stable. Dominant left vertebral artery as in 2021. Sinuses/Orbits: Only trace paranasal sinus mucosal thickening, significance doubtful. Orbits soft tissues  appear symmetric and normal. Soft tissues: Small chronic left paracentral nasopharynx retention cyst (series 7, image 20) has decreased in size since 2021. Other deep soft tissue spaces of the face are normal. No soft tissue inflammation identified. No upper cervical lymphadenopathy. Osseous: Visualized bone marrow signal is within normal limits. No marrow edema or evidence of acute osseous abnormality. Other: Mild right mastoid effusion is new since 2021. But the other visible bilateral internal auditory structures appear normal. Normal right stylomastoid foramen. IMPRESSION: 1. Negative trigeminal nerve imaging when allowing for some motion degradation. No explanation for right facial numbness. 2. Mild right mastoid effusion is new since 2021, but most likely postinflammatory and significance is doubtful. 3. Negative MRI appearance of the face. 4. Partially visible cerebral chronic small vessel disease. Electronically Signed   By: Genevie Ann M.D.   On: 08/29/2021 04:22     ASSESSMENT & PLAN:  1. History of ductal carcinoma in situ (DCIS) of breast   2. Acute cough   3. Port-A-Cath in place   4. Osteoporosis, unspecified osteoporosis type, unspecified pathological fracture presence    history of left DICS-2017 S/p lumpectomy and RT.  Patient has completed 5+ years of letrozole [07/2016-09/2021] Recommend patient to discontinue letrozole. Continue annual mammogram which is due in March 2023.  Patient reports that she has received the reminder from radiology and will call to get an appointment set up for mammogram.  Osteoporosis,  Continue calcium and vitamin D Proceed with Zometa today. 11/29/2020, bone density showed left femur neck T- 2.9, slightly worse than 3 years ago. Can continue bone density every 2 years. .. #Port-A-Cath in place, due to lack of peripheral access.  Continue port flush every 8 weeks #History of DVT and PE, patient is on Pradaxa, not managed by me.  Chronic autoimmune  disorders/rheumatoid arthritis/lupus: Continue follow-up with rheumatology Dr. Posey Pronto..   Orders Placed This Encounter  Procedures   DG Chest 2 View    Standing Status:   Future    Number of Occurrences:   1    Standing Expiration Date:   10/03/2022    Order Specific Question:   Reason for Exam (SYMPTOM  OR DIAGNOSIS REQUIRED)    Answer:   cough decreased breath sound at base    Order Specific Question:   Is patient pregnant?    Answer:   No    Order Specific Question:   Preferred imaging location?    Answer:   [facility]   CBC with Differential/Platelet    Standing Status:   Future    Standing Expiration Date:   10/03/2022   Comprehensive metabolic panel    Standing Status:   Future    Standing Expiration Date:   10/03/2022    All questions were answered. The patient knows to call the clinic with any problems questions or concerns.  Return of visit: 6 months.   Earlie Server, MD, PhD Hematology Oncology [place] [facility] 10/03/2021

## 2021-10-19 ENCOUNTER — Other Ambulatory Visit: Payer: Self-pay | Admitting: Oncology

## 2021-10-19 DIAGNOSIS — Z95828 Presence of other vascular implants and grafts: Secondary | ICD-10-CM

## 2021-10-29 ENCOUNTER — Other Ambulatory Visit: Payer: Self-pay | Admitting: Oncology

## 2021-10-29 DIAGNOSIS — Z95828 Presence of other vascular implants and grafts: Secondary | ICD-10-CM

## 2021-10-31 ENCOUNTER — Encounter: Payer: Self-pay | Admitting: Oncology

## 2021-12-05 ENCOUNTER — Ambulatory Visit
Admission: RE | Admit: 2021-12-05 | Discharge: 2021-12-05 | Disposition: A | Discharge status: Home / Self Care | Payer: Medicare Other | Source: Ambulatory Visit | Attending: Oncology | Admitting: Oncology

## 2021-12-05 DIAGNOSIS — Z1231 Encounter for screening mammogram for malignant neoplasm of breast: Secondary | ICD-10-CM | POA: Insufficient documentation

## 2021-12-05 DIAGNOSIS — Z86000 Personal history of in-situ neoplasm of breast: Secondary | ICD-10-CM | POA: Insufficient documentation

## 2021-12-12 ENCOUNTER — Inpatient Hospital Stay: Payer: Medicare Other | Attending: Oncology

## 2021-12-12 DIAGNOSIS — Z86000 Personal history of in-situ neoplasm of breast: Secondary | ICD-10-CM | POA: Diagnosis not present

## 2021-12-12 DIAGNOSIS — Z452 Encounter for adjustment and management of vascular access device: Secondary | ICD-10-CM | POA: Diagnosis present

## 2021-12-12 DIAGNOSIS — Z95828 Presence of other vascular implants and grafts: Secondary | ICD-10-CM

## 2021-12-12 MED ORDER — HEPARIN SOD (PORK) LOCK FLUSH 100 UNIT/ML IV SOLN
500.0000 [IU] | Freq: Once | INTRAVENOUS | Status: AC
  Administered 2021-12-12: 500 [IU] via INTRAVENOUS
  Filled 2021-12-12: qty 5

## 2021-12-12 MED ORDER — SODIUM CHLORIDE 0.9% FLUSH
10.0000 mL | Freq: Once | INTRAVENOUS | Status: AC
  Administered 2021-12-12: 10 mL via INTRAVENOUS
  Filled 2021-12-12: qty 10

## 2021-12-28 ENCOUNTER — Ambulatory Visit: Payer: Medicare Other | Admitting: Dermatology

## 2021-12-28 ENCOUNTER — Other Ambulatory Visit: Payer: Self-pay | Admitting: Nurse Practitioner

## 2021-12-28 DIAGNOSIS — R6881 Early satiety: Secondary | ICD-10-CM

## 2021-12-28 DIAGNOSIS — R634 Abnormal weight loss: Secondary | ICD-10-CM

## 2021-12-28 DIAGNOSIS — R140 Abdominal distension (gaseous): Secondary | ICD-10-CM

## 2022-01-06 ENCOUNTER — Other Ambulatory Visit: Payer: Self-pay | Admitting: Nurse Practitioner

## 2022-01-06 DIAGNOSIS — K7689 Other specified diseases of liver: Secondary | ICD-10-CM

## 2022-01-10 ENCOUNTER — Encounter: Payer: Self-pay | Admitting: Dermatology

## 2022-01-23 ENCOUNTER — Other Ambulatory Visit: Payer: Medicare Other

## 2022-01-27 ENCOUNTER — Encounter
Admission: RE | Admit: 2022-01-27 | Discharge: 2022-01-27 | Disposition: A | Discharge status: Home / Self Care | Payer: Medicare Other | Source: Ambulatory Visit | Attending: Nurse Practitioner | Admitting: Nurse Practitioner

## 2022-01-27 DIAGNOSIS — R140 Abdominal distension (gaseous): Secondary | ICD-10-CM | POA: Diagnosis present

## 2022-01-27 DIAGNOSIS — R634 Abnormal weight loss: Secondary | ICD-10-CM | POA: Diagnosis present

## 2022-01-27 DIAGNOSIS — R6881 Early satiety: Secondary | ICD-10-CM

## 2022-01-27 MED ORDER — TECHNETIUM TC 99M SULFUR COLLOID
2.0000 | Freq: Once | INTRAVENOUS | Status: AC
  Administered 2022-01-27: 2.16 via INTRAVENOUS

## 2022-01-30 ENCOUNTER — Ambulatory Visit
Admission: RE | Admit: 2022-01-30 | Discharge: 2022-01-30 | Disposition: A | Discharge status: Home / Self Care | Payer: Medicare Other | Source: Ambulatory Visit | Attending: Nurse Practitioner | Admitting: Nurse Practitioner

## 2022-01-30 DIAGNOSIS — K7689 Other specified diseases of liver: Secondary | ICD-10-CM | POA: Insufficient documentation

## 2022-01-30 MED ORDER — SODIUM CHLORIDE 0.9% FLUSH: 10.0000 mL | INTRAVENOUS | Status: DC | PRN

## 2022-01-30 MED ORDER — CHLORHEXIDINE GLUCONATE CLOTH 2 % EX PADS
6.0000 | MEDICATED_PAD | Freq: Every day | CUTANEOUS | Status: DC
Start: 2022-01-30 — End: 2022-01-31

## 2022-01-30 MED ORDER — SODIUM CHLORIDE 0.9% FLUSH: 10.0000 mL | Freq: Two times a day (BID) | INTRAVENOUS | Status: DC

## 2022-01-30 MED ORDER — GADOBUTROL 1 MMOL/ML IV SOLN
7.0000 mL | Freq: Once | INTRAVENOUS | Status: AC | PRN
  Administered 2022-01-30: 7 mL via INTRAVENOUS

## 2022-02-16 ENCOUNTER — Ambulatory Visit: Payer: Medicare Other | Attending: Internal Medicine

## 2022-02-16 DIAGNOSIS — G4733 Obstructive sleep apnea (adult) (pediatric): Secondary | ICD-10-CM | POA: Diagnosis present

## 2022-02-20 ENCOUNTER — Inpatient Hospital Stay: Payer: Medicare Other | Attending: Oncology

## 2022-02-20 DIAGNOSIS — Z452 Encounter for adjustment and management of vascular access device: Secondary | ICD-10-CM | POA: Diagnosis present

## 2022-02-20 DIAGNOSIS — Z86000 Personal history of in-situ neoplasm of breast: Secondary | ICD-10-CM | POA: Insufficient documentation

## 2022-02-20 DIAGNOSIS — Z95828 Presence of other vascular implants and grafts: Secondary | ICD-10-CM

## 2022-02-20 MED ORDER — SODIUM CHLORIDE 0.9% FLUSH
10.0000 mL | Freq: Once | INTRAVENOUS | Status: AC
  Administered 2022-02-20: 10 mL via INTRAVENOUS
  Filled 2022-02-20: qty 10

## 2022-02-20 MED ORDER — HEPARIN SOD (PORK) LOCK FLUSH 100 UNIT/ML IV SOLN
500.0000 [IU] | Freq: Once | INTRAVENOUS | Status: AC
  Administered 2022-02-20: 500 [IU] via INTRAVENOUS
  Filled 2022-02-20: qty 5

## 2022-03-02 ENCOUNTER — Ambulatory Visit (INDEPENDENT_AMBULATORY_CARE_PROVIDER_SITE_OTHER): Payer: Medicare Other | Admitting: Dermatology

## 2022-03-02 DIAGNOSIS — Z1283 Encounter for screening for malignant neoplasm of skin: Secondary | ICD-10-CM

## 2022-03-02 DIAGNOSIS — D1800 Hemangioma unspecified site: Secondary | ICD-10-CM

## 2022-03-02 DIAGNOSIS — L821 Other seborrheic keratosis: Secondary | ICD-10-CM

## 2022-03-02 DIAGNOSIS — Z86018 Personal history of other benign neoplasm: Secondary | ICD-10-CM

## 2022-03-02 DIAGNOSIS — L578 Other skin changes due to chronic exposure to nonionizing radiation: Secondary | ICD-10-CM

## 2022-03-02 DIAGNOSIS — L720 Epidermal cyst: Secondary | ICD-10-CM | POA: Diagnosis not present

## 2022-03-02 DIAGNOSIS — D485 Neoplasm of uncertain behavior of skin: Secondary | ICD-10-CM

## 2022-03-02 DIAGNOSIS — L814 Other melanin hyperpigmentation: Secondary | ICD-10-CM

## 2022-03-02 DIAGNOSIS — D229 Melanocytic nevi, unspecified: Secondary | ICD-10-CM

## 2022-03-02 DIAGNOSIS — Z8739 Personal history of other diseases of the musculoskeletal system and connective tissue: Secondary | ICD-10-CM

## 2022-03-02 NOTE — Patient Instructions (Addendum)
Wound Care Instructions  Cleanse wound gently with soap and water once a day then pat dry with clean gauze. Apply a thing coat of Petrolatum (petroleum jelly, "Vaseline") over the wound (unless you have an allergy to this). We recommend that you use a new, sterile tube of Vaseline. Do not pick or remove scabs. Do not remove the yellow or white "healing tissue" from the base of the wound.  Cover the wound with fresh, clean, nonstick gauze and secure with paper tape. You may use Band-Aids in place of gauze and tape if the would is small enough, but would recommend trimming much of the tape off as there is often too much. Sometimes Band-Aids can irritate the skin.  You should call the office for your biopsy report after 1 week if you have not already been contacted.  If you experience any problems, such as abnormal amounts of bleeding, swelling, significant bruising, significant pain, or evidence of infection, please call the office immediately.  FOR ADULT SURGERY PATIENTS: If you need something for pain relief you may take 1 extra strength Tylenol (acetaminophen) AND 2 Ibuprofen (200mg each) together every 4 hours as needed for pain. (do not take these if you are allergic to them or if you have a reason you should not take them.) Typically, you may only need pain medication for 1 to 3 days.    Due to recent changes in healthcare laws, you may see results of your pathology and/or laboratory studies on MyChart before the doctors have had a chance to review them. We understand that in some cases there may be results that are confusing or concerning to you. Please understand that not all results are received at the same time and often the doctors may need to interpret multiple results in order to provide you with the best plan of care or course of treatment. Therefore, we ask that you please give us 2 business days to thoroughly review all your results before contacting the office for clarification. Should we  see a critical lab result, you will be contacted sooner.   If You Need Anything After Your Visit  If you have any questions or concerns for your doctor, please call our main line at [phone] and press option 4 to reach your doctor's medical assistant. If no one answers, please leave a voicemail as directed and we will return your call as soon as possible. Messages left after 4 pm will be answered the following business day.   You may also send us a message via MyChart. We typically respond to MyChart messages within 1-2 business days.  For prescription refills, please ask your pharmacy to contact our office. Our fax number is [phone].  If you have an urgent issue when the clinic is closed that cannot wait until the next business day, you can page your doctor at the number below.    Please note that while we do our best to be available for urgent issues outside of office hours, we are not available 24/7.   If you have an urgent issue and are unable to reach us, you may choose to seek medical care at your doctor's office, retail clinic, urgent care center, or emergency room.  If you have a medical emergency, please immediately call 911 or go to the emergency department.  Pager Numbers  - Dr. Kowalski: [phone]  - Dr. Moye: [phone]  - Dr. Stewart: [phone]  In the event of inclement weather, please call our main line at [phone]   for an update on the status of any delays or closures.  Dermatology Medication Tips: Please keep the boxes that topical medications come in in order to help keep track of the instructions about where and how to use these. Pharmacies typically print the medication instructions only on the boxes and not directly on the medication tubes.   If your medication is too expensive, please contact our office at [phone] option 4 or send us a message through MyChart.   We are unable to tell what your co-pay for medications will be in advance  as this is different depending on your insurance coverage. However, we may be able to find a substitute medication at lower cost or fill out paperwork to get insurance to cover a needed medication.   If a prior authorization is required to get your medication covered by your insurance company, please allow us 1-2 business days to complete this process.  Drug prices often vary depending on where the prescription is filled and some pharmacies may offer cheaper prices.  The website www.goodrx.com contains coupons for medications through different pharmacies. The prices here do not account for what the cost may be with help from insurance (it may be cheaper with your insurance), but the website can give you the price if you did not use any insurance.  - You can print the associated coupon and take it with your prescription to the pharmacy.  - You may also stop by our office during regular business hours and pick up a GoodRx coupon card.  - If you need your prescription sent electronically to a different pharmacy, notify our office through [facility] MyChart or by phone at [phone] option 4.     Si Usted Necesita Algo Despus de Su Visita  Tambin puede enviarnos un mensaje a travs de MyChart. Por lo general respondemos a los mensajes de MyChart en el transcurso de 1 a 2 das hbiles.  Para renovar recetas, por favor pida a su farmacia que se ponga en contacto con nuestra oficina. Nuestro nmero de fax es el [phone].  Si tiene un asunto urgente cuando la clnica est cerrada y que no puede esperar hasta el siguiente da hbil, puede llamar/localizar a su doctor(a) al nmero que aparece a continuacin.   Por favor, tenga en cuenta que aunque hacemos todo lo posible para estar disponibles para asuntos urgentes fuera del horario de oficina, no estamos disponibles las 24 horas del da, los 7 das de la semana.   Si tiene un problema urgente y no puede comunicarse con nosotros, puede optar  por buscar atencin mdica  en el consultorio de su doctor(a), en una clnica privada, en un centro de atencin urgente o en una sala de emergencias.  Si tiene una emergencia mdica, por favor llame inmediatamente al 911 o vaya a la sala de emergencias.  Nmeros de bper  - Dr. Kowalski: [phone]  - Dra. Moye: [phone]  - Dra. Stewart: [phone]  En caso de inclemencias del tiempo, por favor llame a nuestra lnea principal al [phone] para una actualizacin sobre el estado de cualquier retraso o cierre.  Consejos para la medicacin en dermatologa: Por favor, guarde las cajas en las que vienen los medicamentos de uso tpico para ayudarle a seguir las instrucciones sobre dnde y cmo usarlos. Las farmacias generalmente imprimen las instrucciones del medicamento slo en las cajas y no directamente en los tubos del medicamento.   Si su medicamento es muy caro, por favor, pngase en contacto con nuestra   oficina llamando al [phone] y presione la opcin 4 o envenos un mensaje a travs de MyChart.   No podemos decirle cul ser su copago por los medicamentos por adelantado ya que esto es diferente dependiendo de la cobertura de su seguro. Sin embargo, es posible que podamos encontrar un medicamento sustituto a menor costo o llenar un formulario para que el seguro cubra el medicamento que se considera necesario.   Si se requiere una autorizacin previa para que su compaa de seguros cubra su medicamento, por favor permtanos de 1 a 2 das hbiles para completar este proceso.  Los precios de los medicamentos varan con frecuencia dependiendo del lugar de dnde se surte la receta y alguna farmacias pueden ofrecer precios ms baratos.  El sitio web www.goodrx.com tiene cupones para medicamentos de diferentes farmacias. Los precios aqu no tienen en cuenta lo que podra costar con la ayuda del seguro (puede ser ms barato con su seguro), pero el sitio web puede darle el precio si  no utiliz ningn seguro.  - Puede imprimir el cupn correspondiente y llevarlo con su receta a la farmacia.  - Tambin puede pasar por nuestra oficina durante el horario de atencin regular y recoger una tarjeta de cupones de GoodRx.  - Si necesita que su receta se enve electrnicamente a una farmacia diferente, informe a nuestra oficina a travs de MyChart de [place] o por telfono llamando al [phone] y presione la opcin 4.  

## 2022-03-02 NOTE — Progress Notes (Signed)
Follow-Up Visit   Subjective  Victoria [name] is a 61 y.o. female who presents for the following: Annual Exam (Hx dysplastic nevi ). The patient presents for Total-Body Skin Exam (TBSE) for skin cancer screening and mole check.  The patient has spots, moles and lesions to be evaluated, some may be new or changing.  The following portions of the chart were reviewed this encounter and updated as appropriate:   Tobacco  Allergies  Meds  Problems  Med Hx  Surg Hx  Fam Hx     Review of Systems:  No other skin or systemic complaints except as noted in HPI or Assessment and Plan.  Objective  Well appearing patient in no apparent distress; mood and affect are within normal limits.  A full examination was performed including scalp, head, eyes, ears, nose, lips, neck, chest, axillae, abdomen, back, buttocks, bilateral upper extremities, bilateral lower extremities, hands, feet, fingers, toes, fingernails, and toenails. All findings within normal limits unless otherwise noted below.  Left Upper Back 0.7 cm firm SQ nodule.  Right Breast 0.6 cm red papule.   Assessment & Plan  Epidermal inclusion cyst Left Upper Back Benign-appearing. Exam most consistent with an epidermal inclusion cyst. Discussed that a cyst is a benign growth that can grow over time and sometimes get irritated or inflamed. Recommend observation if it is not bothersome. Discussed option of surgical excision to remove it if it is growing, symptomatic, or other changes noted. Please call for new or changing lesions so they can be evaluated.  Neoplasm of uncertain behavior of skin Right Breast Epidermal / dermal shaving  Lesion diameter (cm):  0.6 Informed consent: discussed and consent obtained   Timeout: patient name, date of birth, surgical site, and procedure verified   Procedure prep:  Patient was prepped and draped in usual sterile fashion Prep type:  Isopropyl alcohol Anesthesia: the lesion was anesthetized in a  standard fashion   Anesthetic:  1% lidocaine w/ epinephrine 1-100,000 buffered w/ 8.4% NaHCO3 Instrument used: flexible razor blade   Hemostasis achieved with: pressure, aluminum chloride and electrodesiccation   Outcome: patient tolerated procedure well   Post-procedure details: sterile dressing applied and wound care instructions given   Dressing type: bandage and petrolatum    Specimen 1 - Surgical pathology Differential Diagnosis: D48.5 r/o hemangioma vs nevus r/o dysplasia Check Margins: No  Lentigines - Scattered tan macules - Due to sun exposure - Benign-appearing, observe - Recommend daily broad spectrum sunscreen SPF 30+ to sun-exposed areas, reapply every 2 hours as needed. - Call for any changes  Seborrheic Keratoses - Stuck-on, waxy, tan-brown papules and/or plaques  - Benign-appearing - Discussed benign etiology and prognosis. - Observe - Call for any changes  Melanocytic Nevi - Tan-brown and/or pink-flesh-colored symmetric macules and papules - Benign appearing on exam today - Observation - Call clinic for new or changing moles - Recommend daily use of broad spectrum spf 30+ sunscreen to sun-exposed areas.   Hemangiomas - Red papules - Discussed benign nature - Observe - Call for any changes  Actinic Damage - Chronic condition, secondary to cumulative UV/sun exposure - diffuse scaly erythematous macules with underlying dyspigmentation - Recommend daily broad spectrum sunscreen SPF 30+ to sun-exposed areas, reapply every 2 hours as needed.  - Staying in the shade or wearing long sleeves, sun glasses (UVA+UVB protection) and wide brim hats (4-inch brim around the entire circumference of the hat) are also recommended for sun protection.  - Call for new or changing lesions.  History of Dysplastic Nevi - No evidence of recurrence today - Recommend regular full body skin exams - Recommend daily broad spectrum sunscreen SPF 30+ to sun-exposed areas, reapply  every 2 hours as needed.  - Call if any new or changing lesions are noted between office visits  Hx of lupus - being followed by rheumatologist  Continue care with rheumatologist.   Skin cancer screening performed today.  Return in about 1 year (around 03/03/2023) for TBSE.  Luther Redo, CMA, am acting as scribe for Sarina Ser, MD . Documentation: I have reviewed the above documentation for accuracy and completeness, and I agree with the above.  Sarina Ser, MD

## 2022-03-06 ENCOUNTER — Telehealth: Payer: Self-pay

## 2022-03-06 ENCOUNTER — Encounter: Payer: Self-pay | Admitting: Dermatology

## 2022-03-06 NOTE — Telephone Encounter (Signed)
Patient informed of pathology results 

## 2022-03-06 NOTE — Telephone Encounter (Signed)
-----   Message from Ralene Bathe, MD sent at 03/03/2022  6:18 PM EDT ----- Diagnosis Skin , right breast HEMANGIOMA  Benign hemangioma No further treatment needed.

## 2022-04-03 ENCOUNTER — Encounter: Payer: Self-pay | Admitting: Oncology

## 2022-04-03 ENCOUNTER — Inpatient Hospital Stay: Payer: Medicare Other | Attending: Oncology | Admitting: Oncology

## 2022-04-03 ENCOUNTER — Inpatient Hospital Stay: Payer: Medicare Other

## 2022-04-03 VITALS — BP 110/46 | HR 82 | Temp 96.0°F | Resp 17 | Wt 189.0 lb

## 2022-04-03 DIAGNOSIS — M810 Age-related osteoporosis without current pathological fracture: Secondary | ICD-10-CM | POA: Insufficient documentation

## 2022-04-03 DIAGNOSIS — Z86000 Personal history of in-situ neoplasm of breast: Secondary | ICD-10-CM

## 2022-04-03 DIAGNOSIS — Z95828 Presence of other vascular implants and grafts: Secondary | ICD-10-CM

## 2022-04-03 DIAGNOSIS — Z79811 Long term (current) use of aromatase inhibitors: Secondary | ICD-10-CM | POA: Diagnosis not present

## 2022-04-03 DIAGNOSIS — D0512 Intraductal carcinoma in situ of left breast: Secondary | ICD-10-CM | POA: Diagnosis not present

## 2022-04-03 DIAGNOSIS — N766 Ulceration of vulva: Secondary | ICD-10-CM

## 2022-04-03 LAB — COMPREHENSIVE METABOLIC PANEL
ALT: 21 U/L (ref 0–44)
AST: 21 U/L (ref 15–41)
Albumin: 3.8 g/dL (ref 3.5–5.0)
Alkaline Phosphatase: 62 U/L (ref 38–126)
Anion gap: 5 (ref 5–15)
BUN: 13 mg/dL (ref 6–20)
CO2: 23 mmol/L (ref 22–32)
Calcium: 9 mg/dL (ref 8.9–10.3)
Chloride: 112 mmol/L — ABNORMAL HIGH (ref 98–111)
Creatinine, Ser: 0.71 mg/dL (ref 0.44–1.00)
GFR, Estimated: >60 mL/min (ref >60)
Glucose, Bld: 120 mg/dL — ABNORMAL HIGH (ref 70–99)
Potassium: 3.5 mmol/L (ref 3.5–5.1)
Sodium: 140 mmol/L (ref 135–145)
Total Bilirubin: 0.4 mg/dL (ref 0.3–1.2)
Total Protein: 6.5 g/dL (ref 6.5–8.1)

## 2022-04-03 LAB — CBC WITH DIFFERENTIAL/PLATELET
Abs Immature Granulocytes: 0.01 10*3/uL (ref 0.00–0.07)
Basophils Absolute: 0 10*3/uL (ref 0.0–0.1)
Basophils Relative: 1 %
Eosinophils Absolute: 0 10*3/uL (ref 0.0–0.5)
Eosinophils Relative: 0 %
HCT: 37.8 % (ref 36.0–46.0)
Hemoglobin: 12.4 g/dL (ref 12.0–15.0)
Immature Granulocytes: 0 %
Lymphocytes Relative: 25 %
Lymphs Abs: 0.9 10*3/uL (ref 0.7–4.0)
MCH: 30.1 pg (ref 26.0–34.0)
MCHC: 32.8 g/dL (ref 30.0–36.0)
MCV: 91.7 fL (ref 80.0–100.0)
Monocytes Absolute: 0.2 10*3/uL (ref 0.1–1.0)
Monocytes Relative: 7 %
Neutro Abs: 2.3 10*3/uL (ref 1.7–7.7)
Neutrophils Relative %: 67 %
Platelets: 116 10*3/uL — ABNORMAL LOW (ref 150–400)
RBC: 4.12 MIL/uL (ref 3.87–5.11)
RDW: 14.6 % (ref 11.5–15.5)
WBC: 3.4 10*3/uL — ABNORMAL LOW (ref 4.0–10.5)
nRBC: 0 % (ref 0.0–0.2)

## 2022-04-03 MED ORDER — ZOLEDRONIC ACID 4 MG/100ML IV SOLN
4.0000 mg | Freq: Once | INTRAVENOUS | Status: AC
  Administered 2022-04-03: 4 mg via INTRAVENOUS
  Filled 2022-04-03: qty 100

## 2022-04-03 MED ORDER — SODIUM CHLORIDE 0.9% FLUSH
10.0000 mL | Freq: Once | INTRAVENOUS | Status: AC
  Administered 2022-04-03: 10 mL via INTRAVENOUS
  Filled 2022-04-03: qty 10

## 2022-04-03 MED ORDER — SODIUM CHLORIDE 0.9 % IV SOLN
Freq: Once | INTRAVENOUS | Status: AC
  Filled 2022-04-03: qty 250

## 2022-04-03 MED ORDER — HEPARIN SOD (PORK) LOCK FLUSH 100 UNIT/ML IV SOLN
500.0000 [IU] | Freq: Once | INTRAVENOUS | Status: AC
  Administered 2022-04-03: 500 [IU] via INTRAVENOUS
  Filled 2022-04-03: qty 5

## 2022-04-03 NOTE — Patient Instructions (Signed)
[facility] AT [place]  Discharge Instructions: Thank you for choosing [facility] to provide your oncology and hematology care.  If you have a lab appointment with the [facility], please go directly to the [facility] and check in at the registration area.  Wear comfortable clothing and clothing appropriate for easy access to any Portacath or PICC line.   We strive to give you quality time with your provider. You may need to reschedule your appointment if you arrive late (15 or more minutes).  Arriving late affects you and other patients whose appointments are after yours.  Also, if you miss three or more appointments without notifying the office, you may be dismissed from the clinic at the provider's discretion.      For prescription refill requests, have your pharmacy contact our office and allow 72 hours for refills to be completed.    Today you received the following chemotherapy and/or immunotherapy agents zometa      To help prevent nausea and vomiting after your treatment, we encourage you to take your nausea medication as directed.  BELOW ARE SYMPTOMS THAT SHOULD BE REPORTED IMMEDIATELY: *FEVER GREATER THAN 100.4 F (38 C) OR HIGHER *CHILLS OR SWEATING *NAUSEA AND VOMITING THAT IS NOT CONTROLLED WITH YOUR NAUSEA MEDICATION *UNUSUAL SHORTNESS OF BREATH *UNUSUAL BRUISING OR BLEEDING *URINARY PROBLEMS (pain or burning when urinating, or frequent urination) *BOWEL PROBLEMS (unusual diarrhea, constipation, pain near the anus) TENDERNESS IN MOUTH AND THROAT WITH OR WITHOUT PRESENCE OF ULCERS (sore throat, sores in mouth, or a toothache) UNUSUAL RASH, SWELLING OR PAIN  UNUSUAL VAGINAL DISCHARGE OR ITCHING   Items with * indicate a potential emergency and should be followed up as soon as possible or go to the Emergency Department if any problems should occur.  Please show the CHEMOTHERAPY ALERT CARD or IMMUNOTHERAPY ALERT CARD at check-in to the  Emergency Department and triage nurse.  Should you have questions after your visit or need to cancel or reschedule your appointment, please contact [facility]CER [place] AT [place]  [phone] and follow the prompts.  Office hours are 8:00 a.m. to 4:30 p.m. Monday - Friday. Please note that voicemails left after 4:00 p.m. may not be returned until the following business day.  We are closed weekends and major holidays. You have access to a nurse at all times for urgent questions. Please call the main number to the clinic [phone] and follow the prompts.  For any non-urgent questions, you may also contact your provider using MyChart. We now offer e-Visits for anyone 67 and older to request care online for non-urgent symptoms. For details visit [email or web address].   Also download the MyChart app! Go to the app store, search "MyChart", open the app, select [facility], and log in with your MyChart username and password.  Masks are optional in the cancer centers. If you would like for your care team to wear a mask while they are taking care of you, please let them know. For doctor visits, patients may have with them one support person who is at least 59 years old. At this time, visitors are not allowed in the infusion area.  Zoledronic Acid Injection (Cancer) What is this medication? ZOLEDRONIC ACID (ZOE le dron ik AS id) treats high calcium levels in the blood caused by cancer. It may also be used with chemotherapy to treat weakened bones caused by cancer. It works by slowing down the release of calcium from bones. This lowers calcium levels in  your blood. It also makes your bones stronger and less likely to break (fracture). It belongs to a group of medications called bisphosphonates. This medicine may be used for other purposes; ask your health care provider or pharmacist if you have questions. COMMON BRAND NAME(S): Zometa, Zometa Powder What should I tell my care team before I  take this medication? They need to know if you have any of these conditions: Dehydration Dental disease Kidney disease Liver disease Low levels of calcium in the blood Lung or breathing disease, such as asthma Receiving steroids, such as dexamethasone or prednisone An unusual or allergic reaction to zoledronic acid, other medications, foods, dyes, or preservatives Pregnant or trying to get pregnant Breast-feeding How should I use this medication? This medication is injected into a vein. It is given by your care team in a hospital or clinic setting. Talk to your care team about the use of this medication in children. Special care may be needed. Overdosage: If you think you have taken too much of this medicine contact a poison control center or emergency room at once. NOTE: This medicine is only for you. Do not share this medicine with others. What if I miss a dose? Keep appointments for follow-up doses. It is important not to miss your dose. Call your care team if you are unable to keep an appointment. What may interact with this medication? Certain antibiotics given by injection Diuretics, such as bumetanide, furosemide NSAIDs, medications for pain and inflammation, such as ibuprofen or naproxen Teriparatide Thalidomide This list may not describe all possible interactions. Give your health care provider a list of all the medicines, herbs, non-prescription drugs, or dietary supplements you use. Also tell them if you smoke, drink alcohol, or use illegal drugs. Some items may interact with your medicine. What should I watch for while using this medication? Visit your care team for regular checks on your progress. It may be some time before you see the benefit from this medication. Some people who take this medication have severe bone, joint, or muscle pain. This medication may also increase your risk for jaw problems or a broken thigh bone. Tell your care team right away if you have severe  pain in your jaw, bones, joints, or muscles. Tell you care team if you have any pain that does not go away or that gets worse. Tell your dentist and dental surgeon that you are taking this medication. You should not have major dental surgery while on this medication. See your dentist to have a dental exam and fix any dental problems before starting this medication. Take good care of your teeth while on this medication. Make sure you see your dentist for regular follow-up appointments. You should make sure you get enough calcium and vitamin D while you are taking this medication. Discuss the foods you eat and the vitamins you take with your care team. Check with your care team if you have severe diarrhea, nausea, and vomiting, or if you sweat a lot. The loss of too much body fluid may make it dangerous for you to take this medication. You may need bloodwork while taking this medication. Talk to your care team if you wish to become pregnant or think you might be pregnant. This medication can cause serious birth defects. What side effects may I notice from receiving this medication? Side effects that you should report to your care team as soon as possible: Allergic reactions--skin rash, itching, hives, swelling of the face, lips, tongue, or throat   Kidney injury--decrease in the amount of urine, swelling of the ankles, hands, or feet Low calcium level--muscle pain or cramps, confusion, tingling, or numbness in the hands or feet Osteonecrosis of the jaw--pain, swelling, or redness in the mouth, numbness of the jaw, poor healing after dental work, unusual discharge from the mouth, visible bones in the mouth Severe bone, joint, or muscle pain Side effects that usually do not require medical attention (report to your care team if they continue or are bothersome): Constipation Fatigue Fever Loss of appetite Nausea Stomach pain This list may not describe all possible side effects. Call your doctor for  medical advice about side effects. You may report side effects to FDA at 1-800-FDA-1088. Where should I keep my medication? This medication is given in a hospital or clinic. It will not be stored at home. NOTE: This sheet is a summary. It may not cover all possible information. If you have questions about this medicine, talk to your doctor, pharmacist, or health care provider.  2023 Elsevier/Gold Standard (2021-09-08 00:00:00)

## 2022-04-03 NOTE — Progress Notes (Signed)
Hematology/Oncology Progress note [phone] [phone]      Patient Care Team: Normajean Glasgow, MD as PCP - General Earlie Server, MD as Consulting Physician (Oncology)  REFERRING PROVIDER: Normajean Glasgow, MD CHIEF COMPLAINTS/REASON FOR VISIT:  Follow up for DCIS HISTORY OF PRESENTING ILLNESS:   Patient moved from [place]. History of left lower extremity blood clot in 2016  started on Eliquis for a month and developed pulmonary embolism.  Switch to Pradaxa.  She also had IVC filter.  After she moved to [place], she was temporarily off Pradaxa.  Currently back on anticoagulation. She presented to emergency room on 06/25/2018 for evaluation of right arm pain, complaining about developing knots right upper extremity.  # DCIS:  I obtained previous oncology history from [place]. Medical records were reviewed by me.  Patient was diagnosed with left breast DCIS in 2017, s/p lumpectomy and radiation.  Initially was started on Arimidex however cannot tolerate and was switched to Letrozole since Dec 2017.   # rheumatoid arthritis/lupus follows up with Dr. Cydney Ok.  No rashes follow-up with Dr. Carlyn Reichert   # history of unprovoked VTE, with IVC filter,  recurrent, on Pradaxa # [place] left thigh showed most likely lipoma.  She has also established care with Rheumatology. started on Leflunomide '20mg'$  daily since March 2020.  Also has been on Plaquenil '200mg'$    # Chronic anemia and thrombocytopenia. Previous work-up includes normal LDH, folate and vitamin B12 level, negative hepatitis panel, HIV negative.  Negative SPEP-[facility].  Normal immature platelet fraction indicating underproduction. # [id number] pneumonia in December 2020 # 11/27/2019 unilateral left diagnostic mammogram done on  for evaluation of self palpated mass.  Study showed no mammographic or sonographic evidence of malignancy in the lower outer quadrant of left  breast.   INTERVAL HISTORY Paticia Moster is a 61 y.o. female who has above history reviewed by me today presents for follow up visit for management of history of DCIS.  Patient takes letrozole,  Patient has Mediport, flush every 8 weeks.   Patient has been off letrozole treatment since last visit.  She finished 5 years of treatment. 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   Dysplastic nevus 06/22/2021   Mod to Severe, R low back lat, recheck at next appt   Dysplastic nevus 06/22/2021   L med calf - severe. Re-shaved 08/30/2021, margins free.   Fibromyalgia    GERD (gastroesophageal reflux disease)    Headache    Migraines   Hearing impaired person, left    bilateral hearing aids   History of dysplastic nevus 02/12/2021   right inf buttock no residual dysplastic nevus margins free   IBS (irritable bowel syndrome)  Lupus ([place])    Mitral valve disorder    MS (multiple sclerosis) ([facility])    Neuromuscular disorder ([place])    Neuropathy    Osteoarthritis    Osteoporosis 05/12/2019   Personal history of radiation therapy 2017   left    Rheumatoid arthritis ([place])    hands and legs   Seizures ([place])    no seizures for 10 years    SURGICAL  HISTORY: Past Surgical History:  Procedure Laterality Date   ABDOMINAL HYSTERECTOMY     APPENDECTOMY     BREAST EXCISIONAL BIOPSY Left 2017   lumpectomy with radation   BREAST EXCISIONAL BIOPSY Left 2004   neg surgical bx    BREAST LUMPECTOMY Left 2017   DCIS   BREAST SURGERY     CHOLECYSTECTOMY     COLONOSCOPY WITH PROPOFOL N/A 07/07/2019   Procedure: COLONOSCOPY WITH PROPOFOL;  Surgeon: Toledo, Benay Pike, MD;  Location: [facility];  Service: Gastroenterology;  Laterality: N/A;   DIRECT LARYNGOSCOPY N/A 09/22/2020   Procedure: SUSPENSION MICRODIRECT LARYNGOSCOPY WITH BIOPSY;  Surgeon: Carloyn Manner, MD;  Location: [facility];  Service: ENT;  Laterality: N/A;  has port a cath needs accessed   ESOPHAGOGASTRODUODENOSCOPY (EGD) WITH PROPOFOL N/A 07/07/2019   Procedure: ESOPHAGOGASTRODUODENOSCOPY (EGD) WITH PROPOFOL;  Surgeon: Toledo, Benay Pike, MD;  Location: [facility];  Service: Gastroenterology;  Laterality: N/A;   PATENT DUCTUS ARTERIOUS REPAIR     RECONSTRUCTION TENDON PULLEY HAND Left    TOTAL HIP ARTHROPLASTY Right     SOCIAL HISTORY: Social History   Socioeconomic History   Marital status: Widowed    Spouse name: Not on file   Number of children: 1   Years of education: Not on file   Highest education level: Some college, no degree  Occupational History   Not on file  Tobacco Use   Smoking status: Former    Packs/day: 2.00    Years: 15.00    Total pack years: 30.00    Types: Cigarettes    Quit date: 06/27/1999    Years since quitting: 22.7   Smokeless tobacco: Never  Vaping Use   Vaping Use: Never used  Substance and Sexual Activity   Alcohol use: Yes    Comment: Rarely - approx 6 beers once a month   Drug use: Never   Sexual activity: Not Currently  Other Topics Concern   Not on file  Social History Narrative   Not on file   Social Determinants of Health   Financial Resource Strain: Low Risk  (11/08/2018)   Overall Financial Resource  Strain (CARDIA)    Difficulty of Paying Living Expenses: Not hard at all  Food Insecurity: No Food Insecurity (11/08/2018)   Hunger Vital Sign    Worried About Running Out of Food in the Last Year: Never true    Ran Out of Food in the Last Year: Never true  Transportation Needs: No Transportation Needs (11/08/2018)   PRAPARE - Hydrologist (Medical): No    Lack of Transportation (Non-Medical): No  Physical Activity: Inactive (11/08/2018)   Exercise Vital Sign    Days of Exercise per Week: 0 days    Minutes of Exercise per Session: 0 min  Stress: No Stress Concern Present (11/08/2018)   [place]    Feeling of Stress : Not at all  Social Connections: Unknown (11/08/2018)   Social Connection and Isolation Panel [NHANES]    Frequency of  Communication with Friends and Family: Not on file    Frequency of Social Gatherings with Friends and Family: Not on file    Attends Religious Services: More than 4 times per year    Active Member of [employer] or Organizations: No    Attends Archivist Meetings: Never    Marital Status: Widowed  Intimate Partner Violence: Not At Risk (11/08/2018)   Humiliation, Afraid, Rape, and Kick questionnaire    Fear of Current or Ex-Partner: No    Emotionally Abused: No    Physically Abused: No    Sexually Abused: No    FAMILY HISTORY: Family History  Problem Relation Age of Onset   COPD Mother    Stroke Father    Arthritis Brother    Diabetes Brother    Stroke Brother    Breast cancer Neg Hx     ALLERGIES:  is allergic to other, sodium hypochlorite, tape, codeine, flagyl [metronidazole], levofloxacin, and sulfa antibiotics.  MEDICATIONS:  Current Outpatient Medications  Medication Sig Dispense Refill   albuterol (VENTOLIN HFA) 108 (90 Base) MCG/ACT inhaler Inhale into the lungs.     Ascorbic Acid (VITAMIN C) 1000 MG tablet Take 1,000 mg by mouth daily.      aspirin EC 81 MG tablet Take by mouth.     atorvastatin (LIPITOR) 40 MG tablet Take 40 mg by mouth daily.     atorvastatin (LIPITOR) 40 MG tablet Take 1 tablet by mouth daily.     azelastine (ASTELIN) 0.1 % nasal spray Place 2 sprays into both nostrils 2 (two) times daily.     benzonatate (TESSALON) 200 MG capsule TAKE 1 CAPSULE BY MOUTH 3 TIMES A DAY ASNEEDED FOR COUGH FOR UP TO SEVEN DAYS     budesonide-formoterol (SYMBICORT) 160-4.5 MCG/ACT inhaler Inhale 2 puffs into the lungs 2 (two) times daily. 1 Inhaler 12   cholecalciferol (VITAMIN D3) 25 MCG (1000 UNIT) tablet Take 1,000 Units by mouth daily.     cyclobenzaprine (FLEXERIL) 10 MG tablet TAKE 1 TABLET BY MOUTH TWICE A DAY AS NEEDED FOR MUSCLE SPASMS 40 tablet 0   dabigatran (PRADAXA) 150 MG CAPS capsule Take 1 capsule (150 mg total) by mouth 2 (two) times daily. 180 capsule 3   fluticasone (FLONASE) 50 MCG/ACT nasal spray Place into both nostrils.     gabapentin (NEURONTIN) 100 MG capsule Take 100 mg by mouth 3 (three) times daily.     gabapentin (NEURONTIN) 300 MG capsule Take 1-2 capsules (300-600 mg total) by mouth 3 (three) times daily. 270 capsule 0   hydroxychloroquine (PLAQUENIL) 200 MG tablet Take 200 mg by mouth daily.     Ipratropium-Albuterol (COMBIVENT RESPIMAT) 20-100 MCG/ACT AERS respimat INHALE 2 PUFFS BY MOUTH INTO THE LUNGS EVERY 4 TIMES DAILY AS NEEDED FOR WHEEZING     JARDIANCE 10 MG TABS tablet Take 10 mg by mouth daily.     metoprolol succinate (TOPROL-XL) 25 MG 24 hr tablet Take 25 mg by mouth daily.     omeprazole (PRILOSEC) 40 MG capsule Take 1 capsule (40 mg total) by mouth 2 (two) times daily. 60 capsule 6   ondansetron (ZOFRAN) 4 MG tablet Take 1 tablet (4 mg total) by mouth every 8 (eight) hours as needed for up to 10 doses for nausea or vomiting. 20 tablet 0   Tiotropium Bromide Monohydrate (SPIRIVA RESPIMAT) 2.5 MCG/ACT AERS Inhale 2 puffs into the lungs every morning. 1 Inhaler 11   topiramate (TOPAMAX) 50 MG  tablet Take 50 mg by  mouth 2 (two) times daily.     traZODone (DESYREL) 50 MG tablet Take 3 tablets (150 mg total) by mouth at bedtime. 180 tablet 1   methotrexate 2.5 MG tablet Take 2.5 mg by mouth once a week. (Patient not taking: Reported on 10/03/2021)     No current facility-administered medications for this visit.   Facility-Administered Medications Ordered in Other Visits  Medication Dose Route Frequency Provider Last Rate Last Admin   sodium chloride flush (NS) 0.9 % injection 10 mL  10 mL Intravenous PRN Earlie Server, MD   10 mL at 01/22/19 1503   Review of Systems  Constitutional:  Negative for appetite change, chills, fatigue and fever.  HENT:   Negative for hearing loss and voice change.   Eyes:  Negative for eye problems.  Respiratory:  Negative for chest tightness and cough.   Cardiovascular:  Negative for chest pain.  Gastrointestinal:  Negative for abdominal distention, abdominal pain and blood in stool.  Endocrine: Negative for hot flashes.  Genitourinary:  Negative for difficulty urinating and frequency.        Genital sore  Musculoskeletal:  Negative for arthralgias.  Skin:  Negative for itching and rash.  Neurological:  Negative for extremity weakness.  Hematological:  Negative for adenopathy.  Psychiatric/Behavioral:  Negative for confusion.      PHYSICAL EXAMINATION: ECOG PERFORMANCE STATUS: 1 - Symptomatic but completely ambulatory Vitals:   04/03/22 1317  BP: (!) 110/46  Pulse: 82  Resp: 17  Temp: (!) 96 F (35.6 C)  SpO2: 99%   Filed Weights   04/03/22 1317  Weight: 189 lb (85.7 kg)    Physical Exam Constitutional:      General: She is not in acute distress.    Appearance: She is obese. She is not diaphoretic.  HENT:     Head: Normocephalic and atraumatic.     Nose: Nose normal.     Mouth/Throat:     Pharynx: No oropharyngeal exudate.  Eyes:     General: No scleral icterus.    Pupils: Pupils are equal, round, and reactive to light.   Cardiovascular:     Rate and Rhythm: Normal rate and regular rhythm.     Heart sounds: No murmur heard. Pulmonary:     Effort: Pulmonary effort is normal. No respiratory distress.     Breath sounds: No rales.  Chest:     Chest wall: No tenderness.  Abdominal:     General: There is no distension.     Palpations: Abdomen is soft.     Tenderness: There is no abdominal tenderness.  Musculoskeletal:        General: Normal range of motion.     Cervical back: Normal range of motion and neck supple.  Skin:    General: Skin is warm and dry.     Findings: No erythema.  Neurological:     Mental Status: She is alert and oriented to person, place, and time.     Cranial Nerves: No cranial nerve deficit.     Motor: No abnormal muscle tone.     Coordination: Coordination normal.  Psychiatric:        Mood and Affect: Affect normal.   Breast exam was performed in seated and lying down position. Patient is status post lumpectomy with a well-healed surgical scar.  No palpable breast mass seen bilateral breast.  No palpable lymphadenopathy bilaterally. Marland Kitchen   LABORATORY DATA:  I have reviewed the data as listed    Latest Ref  Rng & Units 04/03/2022   12:57 PM 10/03/2021   12:52 PM 03/30/2021   12:56 PM  CBC  WBC 4.0 - 10.5 K/uL 3.4  5.6  3.8   Hemoglobin 12.0 - 15.0 g/dL 12.4  12.1  11.9   Hematocrit 36.0 - 46.0 % 37.8  37.6  36.4   Platelets 150 - 400 K/uL 116  167  123       Latest Ref Rng & Units 04/03/2022   12:57 PM 10/03/2021   12:52 PM 03/30/2021   12:56 PM  CMP  Glucose 70 - 99 mg/dL 120  123  132   BUN 6 - 20 mg/dL '13  16  14   '$ Creatinine 0.44 - 1.00 mg/dL 0.71  0.86  0.79   Sodium 135 - 145 mmol/L 140  138  136   Potassium 3.5 - 5.1 mmol/L 3.5  3.7  3.4   Chloride 98 - 111 mmol/L 112  106  105   CO2 22 - 32 mmol/L '23  23  26   '$ Calcium 8.9 - 10.3 mg/dL 9.0  9.1  8.9   Total Protein 6.5 - 8.1 g/dL 6.5  6.6  6.5   Total Bilirubin 0.3 - 1.2 mg/dL 0.4  <0.1  0.6   Alkaline Phos 38  - 126 U/L 62  64  73   AST 15 - 41 U/L '21  18  24   '$ ALT 0 - 44 U/L '21  13  23     '$ RADIOGRAPHIC STUDIES: I have personally reviewed the radiological images as listed and agreed with the findings in the report. US venous upper right extremity 06/04/2018  No evidence of DVT within the right upper extremity 07/09/2018 CXR  Interval resolution of subsegmental atelectasis or interstitial edema. Mild chronic bronchitic changes. No acute pneumonia nor pulmonary edema. Thoracic aortic atherosclerosis  SLEEP STUDY DOCUMENTS  Result Date: 02/20/2022 Ordered by an unspecified provider.  MR ABDOMEN WWO CONTRAST  Result Date: 01/30/2022 CLINICAL DATA:  Follow-liver lesion seen on CT scan. History of breast cancer and cholecystectomy. EXAM: MRI ABDOMEN WITHOUT AND WITH CONTRAST TECHNIQUE: Multiplanar multisequence MR imaging of the abdomen was performed both before and after the administration of intravenous contrast. CONTRAST:  51m GADAVIST GADOBUTROL 1 MMOL/ML IV SOLN COMPARISON:  Abdominopelvic CT 10/11/2020 FINDINGS: Lower chest:  The visualized lower chest appears unremarkable. Hepatobiliary: Stable punctate cysts scattered throughout the liver. No enhancing or otherwise suspicious liver lesion. No biliary dilatation status post cholecystectomy. Pancreas: Unremarkable. No pancreatic ductal dilatation or surrounding inflammatory changes. Spleen: Normal in size without focal abnormality. Adrenals/Urinary Tract: Both adrenal glands appear normal. No evidence of renal mass, hydronephrosis or perinephric soft tissue stranding. Bladder not imaged. Stomach/Bowel: The stomach appears unremarkable for its degree of distension. No evidence of bowel wall thickening, distention or surrounding inflammatory change. Vascular/Lymphatic: There are no enlarged abdominal lymph nodes. Unchanged position of IVC filter which extends superior to the renal veins. No evidence of acute thrombosis. Other: No evidence of abdominal wall  hernia or ascites. Musculoskeletal: No acute or significant osseous findings. IMPRESSION: 1. No evidence of metastatic breast cancer. 2. Stable tiny cysts within the liver. No suspicious liver lesions or biliary dilatation post cholecystectomy. 3. IVC filter remains in place, terminating above the renal veins, present since at least 05/06/2018. Electronically Signed   By: WRichardean SaleM.D.   On: 01/30/2022 15:38   NM GASTRIC EMPTYING  Result Date: 01/27/2022 CLINICAL DATA:  Early satiety concern for gastroparesis. EXAM: NUCLEAR MEDICINE  GASTRIC EMPTYING SCAN TECHNIQUE: After oral ingestion of radiolabeled meal, sequential abdominal images were obtained for 3 hours. Percentage of activity emptying the stomach was calculated at 1 hour, 2 hour and 3 hours. RADIOPHARMACEUTICALS:  2.07 mCi Tc-72msulfur colloid in standardized meal COMPARISON:  CT October 11, 2020. FINDINGS: Expected location of the stomach in the left upper quadrant. Ingested meal empties the stomach gradually over the course of the study. 21% emptied at 1 hr ( normal >= 10%) 60% emptied at 2 hr ( normal >= 40%) 94% emptied at 3 hr ( normal >= 70%) IMPRESSION: No scintigraphic evidence of delayed gastric emptying. Electronically Signed   By: JDahlia BailiffM.D.   On: 01/27/2022 15:16     ASSESSMENT & PLAN:  1. History of ductal carcinoma in situ (DCIS) of breast   2. Port-A-Cath in place   3. Aromatase inhibitor use   4. Osteoporosis, unspecified osteoporosis type, unspecified pathological fracture presence   5. Genital ulcer, female    history of left DICS-2017,S/p lumpectomy and RT.  Patient has completed 5+ years of letrozole [07/2016-09/2021] Continue annual mammogram-due March 2024.  Genital ulcer, ?  Atrophic vaginitis, STDs.  Recommend patient to obtain gynecology evaluation. I do not recommend empiric long-term estrogen containing cream use due to her history of DCIS. Patient will further discuss with primary care  provider.  Osteoporosis,  Continue calcium and vitamin D Proceed with Zometa today 11/29/2020, bone density showed left femur neck T- 2.9, slightly worse than 3 years ago. Can continue bone density every 2 years-due May 2024, will obtain. .. #Port-A-Cath in place, due to lack of peripheral access.  Continue port flush every 8 weeks #History of DVT and PE, patient is on Pradaxa, not managed by me.  Chronic autoimmune disorders/rheumatoid arthritis/lupus: Continue follow-up with rheumatology Dr. PPosey Pronto.   Orders Placed This Encounter  Procedures   MM 3D SCREEN BREAST BILATERAL    Standing Status:   Future    Standing Expiration Date:   04/04/2023    Order Specific Question:   Reason for Exam (SYMPTOM  OR DIAGNOSIS REQUIRED)    Answer:   screening mammo    Order Specific Question:   Preferred imaging location?    Answer:   [facility]    Order Specific Question:   Is the patient pregnant?    Answer:   No   DG Bone Density    Standing Status:   Future    Standing Expiration Date:   04/03/2023    Order Specific Question:   Reason for Exam (SYMPTOM  OR DIAGNOSIS REQUIRED)    Answer:   history of breast cancer    Order Specific Question:   Is the patient pregnant?    Answer:   No    Order Specific Question:   Preferred imaging location?    Answer:   [facility]   Basic metabolic panel    Standing Status:   Future    Standing Expiration Date:   04/03/2023   CBC with Differential/Platelet    Standing Status:   Future    Standing Expiration Date:   04/04/2023   Comprehensive metabolic panel    Standing Status:   Future    Standing Expiration Date:   04/03/2023    All questions were answered. The patient knows to call the clinic with any problems questions or concerns.  Return of visit: 6 months.   ZEarlie Server MD, PhD Hematology Oncology [facility] [facility]8/28/2023

## 2022-04-03 NOTE — Progress Notes (Signed)
Patient here for oncology follow-up appointment, concerns genital sores and fatigue

## 2022-05-29 ENCOUNTER — Inpatient Hospital Stay: Payer: Medicare Other

## 2022-06-01 ENCOUNTER — Inpatient Hospital Stay: Payer: Medicare Other | Attending: Oncology

## 2022-06-01 DIAGNOSIS — D0512 Intraductal carcinoma in situ of left breast: Secondary | ICD-10-CM | POA: Diagnosis not present

## 2022-06-01 DIAGNOSIS — Z452 Encounter for adjustment and management of vascular access device: Secondary | ICD-10-CM | POA: Diagnosis present

## 2022-06-01 DIAGNOSIS — Z95828 Presence of other vascular implants and grafts: Secondary | ICD-10-CM

## 2022-06-01 MED ORDER — HEPARIN SOD (PORK) LOCK FLUSH 100 UNIT/ML IV SOLN
500.0000 [IU] | Freq: Once | INTRAVENOUS | Status: AC
  Administered 2022-06-01: 500 [IU] via INTRAVENOUS
  Filled 2022-06-01: qty 5

## 2022-06-01 MED ORDER — SODIUM CHLORIDE 0.9% FLUSH
10.0000 mL | Freq: Once | INTRAVENOUS | Status: AC
  Administered 2022-06-01: 10 mL via INTRAVENOUS
  Filled 2022-06-01: qty 10

## 2022-07-24 ENCOUNTER — Inpatient Hospital Stay: Payer: Medicare Other

## 2022-07-28 ENCOUNTER — Ambulatory Visit: Admit: 2022-07-28 | Payer: Medicare Other | Admitting: Gastroenterology

## 2022-07-28 SURGERY — COLONOSCOPY WITH PROPOFOL
Anesthesia: General

## 2022-09-04 ENCOUNTER — Encounter: Payer: Self-pay | Admitting: Oncology

## 2022-09-21 ENCOUNTER — Telehealth: Payer: Self-pay | Admitting: Oncology

## 2022-09-21 NOTE — Telephone Encounter (Signed)
Patient states she will not be in town for her appointment on 2/28. She will be back march 6 and would like to r/s the appointment to after then. Please advise on scheduling.

## 2022-09-21 NOTE — Telephone Encounter (Signed)
please r/s appt on 2/28 to March (per her availability). Please change lab to [facility] flush with Lab.   after that appt pt will need port flush every 8 weeks until next visit.   also, appt in August will need to be adjusted to be 6 months after new appt in March.

## 2022-10-04 ENCOUNTER — Ambulatory Visit: Payer: Medicare Other

## 2022-10-04 ENCOUNTER — Other Ambulatory Visit: Payer: Self-pay

## 2022-10-04 ENCOUNTER — Ambulatory Visit: Payer: Self-pay

## 2022-10-04 ENCOUNTER — Other Ambulatory Visit: Payer: Medicare Other

## 2022-10-12 ENCOUNTER — Inpatient Hospital Stay: Payer: 59

## 2022-10-12 ENCOUNTER — Telehealth: Payer: Self-pay

## 2022-10-12 ENCOUNTER — Inpatient Hospital Stay: Payer: 59 | Attending: Oncology

## 2022-10-12 DIAGNOSIS — Z86000 Personal history of in-situ neoplasm of breast: Secondary | ICD-10-CM

## 2022-10-12 DIAGNOSIS — M810 Age-related osteoporosis without current pathological fracture: Secondary | ICD-10-CM | POA: Diagnosis present

## 2022-10-12 DIAGNOSIS — Z95828 Presence of other vascular implants and grafts: Secondary | ICD-10-CM

## 2022-10-12 LAB — BASIC METABOLIC PANEL
Anion gap: 6 (ref 5–15)
BUN: 14 mg/dL (ref 6–20)
CO2: 20 mmol/L — ABNORMAL LOW (ref 22–32)
Calcium: 8.5 mg/dL — ABNORMAL LOW (ref 8.9–10.3)
Chloride: 112 mmol/L — ABNORMAL HIGH (ref 98–111)
Creatinine, Ser: 0.59 mg/dL (ref 0.44–1.00)
GFR, Estimated: >60 mL/min (ref >60)
Glucose, Bld: 137 mg/dL — ABNORMAL HIGH (ref 70–99)
Potassium: 3.5 mmol/L (ref 3.5–5.1)
Sodium: 138 mmol/L (ref 135–145)

## 2022-10-12 MED ORDER — HEPARIN SOD (PORK) LOCK FLUSH 100 UNIT/ML IV SOLN
500.0000 [IU] | Freq: Once | INTRAVENOUS | Status: AC
  Administered 2022-10-12: 500 [IU] via INTRAVENOUS
  Filled 2022-10-12: qty 5

## 2022-10-12 NOTE — Telephone Encounter (Signed)
Per Md request: repeat bmp in 1 week, +/- zometa

## 2022-10-12 NOTE — Progress Notes (Signed)
Per Dr Tasia Catchings, no zometa today due to calcium 8.5.  Pt to take 1200 mg calcium daily and return next week for repeat labs/possible zometa.  Pt verbazlies understanding.

## 2022-10-12 NOTE — Patient Instructions (Signed)

## 2022-10-19 ENCOUNTER — Inpatient Hospital Stay: Payer: 59

## 2022-10-19 ENCOUNTER — Other Ambulatory Visit: Payer: Self-pay | Admitting: Oncology

## 2022-10-19 VITALS — BP 96/57 | HR 72 | Temp 96.5°F | Resp 16

## 2022-10-19 DIAGNOSIS — Z95828 Presence of other vascular implants and grafts: Secondary | ICD-10-CM

## 2022-10-19 DIAGNOSIS — M810 Age-related osteoporosis without current pathological fracture: Secondary | ICD-10-CM

## 2022-10-19 DIAGNOSIS — Z86000 Personal history of in-situ neoplasm of breast: Secondary | ICD-10-CM

## 2022-10-19 LAB — BASIC METABOLIC PANEL - CANCER CENTER ONLY
Anion gap: 5 (ref 5–15)
BUN: 17 mg/dL (ref 6–20)
CO2: 23 mmol/L (ref 22–32)
Calcium: 8.8 mg/dL — ABNORMAL LOW (ref 8.9–10.3)
Chloride: 107 mmol/L (ref 98–111)
Creatinine: 0.72 mg/dL (ref 0.44–1.00)
GFR, Estimated: >60 mL/min (ref >60)
Glucose, Bld: 134 mg/dL — ABNORMAL HIGH (ref 70–99)
Potassium: 3.7 mmol/L (ref 3.5–5.1)
Sodium: 135 mmol/L (ref 135–145)

## 2022-10-19 MED ORDER — HEPARIN SOD (PORK) LOCK FLUSH 100 UNIT/ML IV SOLN
500.0000 [IU] | Freq: Once | INTRAVENOUS | Status: DC
  Filled 2022-10-19: qty 5

## 2022-10-19 MED ORDER — SODIUM CHLORIDE 0.9% FLUSH
10.0000 mL | INTRAVENOUS | Status: DC | PRN
  Filled 2022-10-19: qty 10

## 2022-10-19 MED ORDER — ZOLEDRONIC ACID 4 MG/100ML IV SOLN
4.0000 mg | Freq: Once | INTRAVENOUS | Status: AC
  Administered 2022-10-19: 4 mg via INTRAVENOUS
  Filled 2022-10-19: qty 100

## 2022-10-19 MED ORDER — SODIUM CHLORIDE 0.9 % IV SOLN
INTRAVENOUS | Status: DC
  Filled 2022-10-19: qty 250

## 2022-12-07 ENCOUNTER — Ambulatory Visit
Admission: RE | Admit: 2022-12-07 | Discharge: 2022-12-07 | Disposition: A | Discharge status: Home / Self Care | Payer: 59 | Source: Ambulatory Visit | Attending: Oncology | Admitting: Oncology

## 2022-12-07 DIAGNOSIS — M069 Rheumatoid arthritis, unspecified: Secondary | ICD-10-CM | POA: Diagnosis not present

## 2022-12-07 DIAGNOSIS — J4489 Other specified chronic obstructive pulmonary disease: Secondary | ICD-10-CM | POA: Insufficient documentation

## 2022-12-07 DIAGNOSIS — Z86000 Personal history of in-situ neoplasm of breast: Secondary | ICD-10-CM | POA: Insufficient documentation

## 2022-12-07 DIAGNOSIS — M810 Age-related osteoporosis without current pathological fracture: Secondary | ICD-10-CM | POA: Diagnosis not present

## 2022-12-07 DIAGNOSIS — Z13820 Encounter for screening for osteoporosis: Secondary | ICD-10-CM | POA: Diagnosis not present

## 2022-12-07 DIAGNOSIS — Z1231 Encounter for screening mammogram for malignant neoplasm of breast: Secondary | ICD-10-CM | POA: Diagnosis present

## 2022-12-07 DIAGNOSIS — M797 Fibromyalgia: Secondary | ICD-10-CM | POA: Diagnosis not present

## 2022-12-08 ENCOUNTER — Inpatient Hospital Stay: Payer: 59 | Attending: Oncology

## 2022-12-08 DIAGNOSIS — Z95828 Presence of other vascular implants and grafts: Secondary | ICD-10-CM

## 2022-12-08 DIAGNOSIS — Z86000 Personal history of in-situ neoplasm of breast: Secondary | ICD-10-CM | POA: Diagnosis not present

## 2022-12-08 DIAGNOSIS — Z452 Encounter for adjustment and management of vascular access device: Secondary | ICD-10-CM | POA: Diagnosis present

## 2022-12-08 MED ORDER — SODIUM CHLORIDE 0.9% FLUSH
10.0000 mL | Freq: Once | INTRAVENOUS | Status: AC
  Administered 2022-12-08: 10 mL via INTRAVENOUS
  Filled 2022-12-08: qty 10

## 2022-12-08 MED ORDER — HEPARIN SOD (PORK) LOCK FLUSH 100 UNIT/ML IV SOLN
500.0000 [IU] | Freq: Once | INTRAVENOUS | Status: AC
  Administered 2022-12-08: 500 [IU] via INTRAVENOUS
  Filled 2022-12-08: qty 5

## 2023-02-02 ENCOUNTER — Inpatient Hospital Stay: Payer: 59 | Attending: Oncology

## 2023-02-02 DIAGNOSIS — Z86000 Personal history of in-situ neoplasm of breast: Secondary | ICD-10-CM | POA: Insufficient documentation

## 2023-02-02 DIAGNOSIS — Z452 Encounter for adjustment and management of vascular access device: Secondary | ICD-10-CM | POA: Diagnosis present

## 2023-02-02 DIAGNOSIS — Z95828 Presence of other vascular implants and grafts: Secondary | ICD-10-CM

## 2023-02-02 MED ORDER — SODIUM CHLORIDE 0.9% FLUSH
10.0000 mL | Freq: Once | INTRAVENOUS | Status: AC
  Administered 2023-02-02: 10 mL via INTRAVENOUS
  Filled 2023-02-02: qty 10

## 2023-02-02 MED ORDER — HEPARIN SOD (PORK) LOCK FLUSH 100 UNIT/ML IV SOLN
500.0000 [IU] | Freq: Once | INTRAVENOUS | Status: AC
  Administered 2023-02-02: 500 [IU] via INTRAVENOUS
  Filled 2023-02-02: qty 5

## 2023-02-02 NOTE — Progress Notes (Signed)
Patient tolerated port flush well today, port flushes well. Blood return noted. No concerns voiced, patient discharged, stable.  

## 2023-03-30 ENCOUNTER — Inpatient Hospital Stay: Payer: 59 | Attending: Oncology

## 2023-03-30 DIAGNOSIS — Z452 Encounter for adjustment and management of vascular access device: Secondary | ICD-10-CM | POA: Diagnosis present

## 2023-03-30 DIAGNOSIS — D0512 Intraductal carcinoma in situ of left breast: Secondary | ICD-10-CM | POA: Insufficient documentation

## 2023-03-30 DIAGNOSIS — Z95828 Presence of other vascular implants and grafts: Secondary | ICD-10-CM

## 2023-03-30 MED ORDER — HEPARIN SOD (PORK) LOCK FLUSH 100 UNIT/ML IV SOLN
500.0000 [IU] | Freq: Once | INTRAVENOUS | Status: AC
  Administered 2023-03-30: 500 [IU] via INTRAVENOUS
  Filled 2023-03-30: qty 5

## 2023-03-30 MED ORDER — SODIUM CHLORIDE 0.9% FLUSH
10.0000 mL | Freq: Once | INTRAVENOUS | Status: AC
  Administered 2023-03-30: 10 mL via INTRAVENOUS
  Filled 2023-03-30: qty 10

## 2023-04-04 ENCOUNTER — Ambulatory Visit: Payer: Medicare Other

## 2023-04-04 ENCOUNTER — Ambulatory Visit: Payer: Medicare Other | Admitting: Oncology

## 2023-04-04 ENCOUNTER — Other Ambulatory Visit: Payer: Medicare Other

## 2023-04-12 ENCOUNTER — Ambulatory Visit: Payer: 59 | Admitting: Dermatology

## 2023-04-13 ENCOUNTER — Other Ambulatory Visit: Payer: Self-pay

## 2023-04-13 DIAGNOSIS — Z86000 Personal history of in-situ neoplasm of breast: Secondary | ICD-10-CM

## 2023-04-16 ENCOUNTER — Encounter: Payer: Self-pay | Admitting: Oncology

## 2023-04-16 ENCOUNTER — Inpatient Hospital Stay: Payer: 59

## 2023-04-16 ENCOUNTER — Inpatient Hospital Stay: Payer: 59 | Attending: Oncology

## 2023-04-16 ENCOUNTER — Inpatient Hospital Stay (HOSPITAL_BASED_OUTPATIENT_CLINIC_OR_DEPARTMENT_OTHER): Payer: 59 | Admitting: Oncology

## 2023-04-16 VITALS — BP 110/72 | HR 62

## 2023-04-16 VITALS — BP 115/57 | HR 69 | Temp 96.0°F | Resp 18 | Wt 175.4 lb

## 2023-04-16 DIAGNOSIS — Z86000 Personal history of in-situ neoplasm of breast: Secondary | ICD-10-CM

## 2023-04-16 DIAGNOSIS — M810 Age-related osteoporosis without current pathological fracture: Secondary | ICD-10-CM

## 2023-04-16 DIAGNOSIS — Z95828 Presence of other vascular implants and grafts: Secondary | ICD-10-CM | POA: Diagnosis not present

## 2023-04-16 DIAGNOSIS — I82509 Chronic embolism and thrombosis of unspecified deep veins of unspecified lower extremity: Secondary | ICD-10-CM

## 2023-04-16 LAB — CBC WITH DIFFERENTIAL (CANCER CENTER ONLY)
Abs Immature Granulocytes: 0.01 10*3/uL (ref 0.00–0.07)
Basophils Absolute: 0 10*3/uL (ref 0.0–0.1)
Basophils Relative: 1 %
Eosinophils Absolute: 0 10*3/uL (ref 0.0–0.5)
Eosinophils Relative: 0 %
HCT: 38.4 % (ref 36.0–46.0)
Hemoglobin: 12.3 g/dL (ref 12.0–15.0)
Immature Granulocytes: 0 %
Lymphocytes Relative: 33 %
Lymphs Abs: 1.2 10*3/uL (ref 0.7–4.0)
MCH: 30.1 pg (ref 26.0–34.0)
MCHC: 32 g/dL (ref 30.0–36.0)
MCV: 94.1 fL (ref 80.0–100.0)
Monocytes Absolute: 0.2 10*3/uL (ref 0.1–1.0)
Monocytes Relative: 6 %
Neutro Abs: 2.2 10*3/uL (ref 1.7–7.7)
Neutrophils Relative %: 60 %
Platelet Count: 157 10*3/uL (ref 150–400)
RBC: 4.08 MIL/uL (ref 3.87–5.11)
RDW: 15.7 % — ABNORMAL HIGH (ref 11.5–15.5)
WBC Count: 3.7 10*3/uL — ABNORMAL LOW (ref 4.0–10.5)
nRBC: 0 % (ref 0.0–0.2)

## 2023-04-16 LAB — CMP (CANCER CENTER ONLY)
ALT: 21 U/L (ref 0–44)
AST: 20 U/L (ref 15–41)
Albumin: 3.8 g/dL (ref 3.5–5.0)
Alkaline Phosphatase: 55 U/L (ref 38–126)
Anion gap: 5 (ref 5–15)
BUN: 14 mg/dL (ref 6–20)
CO2: 21 mmol/L — ABNORMAL LOW (ref 22–32)
Calcium: 8.6 mg/dL — ABNORMAL LOW (ref 8.9–10.3)
Chloride: 112 mmol/L — ABNORMAL HIGH (ref 98–111)
Creatinine: 0.82 mg/dL (ref 0.44–1.00)
GFR, Estimated: >60 mL/min (ref >60)
Glucose, Bld: 117 mg/dL — ABNORMAL HIGH (ref 70–99)
Potassium: 3.3 mmol/L — ABNORMAL LOW (ref 3.5–5.1)
Sodium: 138 mmol/L (ref 135–145)
Total Bilirubin: 0.4 mg/dL (ref 0.3–1.2)
Total Protein: 6.6 g/dL (ref 6.5–8.1)

## 2023-04-16 MED ORDER — SODIUM CHLORIDE 0.9 % IV SOLN
INTRAVENOUS | Status: DC
  Filled 2023-04-16: qty 250

## 2023-04-16 MED ORDER — ZOLEDRONIC ACID 4 MG/100ML IV SOLN
4.0000 mg | Freq: Once | INTRAVENOUS | Status: AC
  Administered 2023-04-16: 4 mg via INTRAVENOUS

## 2023-04-16 MED ORDER — HEPARIN SOD (PORK) LOCK FLUSH 100 UNIT/ML IV SOLN
500.0000 [IU] | Freq: Once | INTRAVENOUS | Status: AC
  Administered 2023-04-16: 500 [IU] via INTRAVENOUS
  Filled 2023-04-16: qty 5

## 2023-04-16 MED ORDER — SODIUM CHLORIDE 0.9% FLUSH
10.0000 mL | Freq: Once | INTRAVENOUS | Status: AC
  Administered 2023-04-16: 10 mL via INTRAVENOUS
  Filled 2023-04-16: qty 10

## 2023-04-16 NOTE — Assessment & Plan Note (Signed)
Continue Xarelto 20mg  daily, managed by [facility] hematology

## 2023-04-16 NOTE — Progress Notes (Signed)
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  Patient has completed 5+ years of letrozole [07/2016-09/2021] Continue annual mammogram-due March 2025  Osteoporosis Osteoporosis,  Continue calcium and vitamin D Proceed with Zometa today May 2024  bone density showed stable osteoporosis left femur neck T- 2.9,stable.  continue bone density every 2 years- May 2026  Port-A-Cath in place due to lack of peripheral access. 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    Answer:   [facility]    Order Specific Question:   Is the patient pregnant?    Answer:   No   Basic Metabolic Panel - Cancer Center Only    Standing Status:   Future    Standing Expiration Date:   04/15/2024   Follow up  6 months lab BMP + Zometa  1 year. Lab MD cbc cmp Zometa All questions were answered. The patient knows to call the clinic with any problems, questions or concerns.  Rickard Patience, MD, PhD [facility] Hematology Oncology 04/16/2023   HISTORY OF PRESENTING ILLNESS:   Patient moved from [place]. History of left lower extremity blood clot in 2016  started on Eliquis for a month and developed pulmonary embolism.  Switch to Pradaxa.  She also had IVC filter.   After she moved to [place], she was temporarily off Pradaxa.  Currently back on anticoagulation. She presented to emergency room on 06/25/2018 for evaluation of right arm pain, complaining about developing knots right upper extremity.  # DCIS:  I obtained previous oncology history from [place] [facility] Dr.Luong Roe Coombs. Medical records were reviewed by me.  Patient was diagnosed with left breast DCIS in 2017, s/p lumpectomy and radiation.  Initially was started on Arimidex however cannot tolerate and was switched to Letrozole since Dec 2017.   # rheumatoid arthritis/lupus follows up with Dr. Zenaida Deed.  No rashes follow-up with Dr. Ladell Pier   # history of unprovoked VTE, with IVC filter,  recurrent  # [place] left thigh showed most likely lipoma.  She has also established care with Rheumatology. started on Leflunomide 20mg  daily since March 2020.  Also has been on Plaquenil 200mg    # Chronic anemia and thrombocytopenia. Previous work-up includes normal LDH, folate and vitamin B12 level, negative hepatitis panel, HIV negative.  Negative [facility].  Normal immature platelet fraction indicating underproduction. # COVID-19 pneumonia in December 2020 # 11/27/2019 unilateral left diagnostic mammogram done on  for evaluation of self palpated mass.  Study showed no mammographic or sonographic evidence of malignancy in the lower outer quadrant of left breast.  Chronic autoimmune disorders/rheumatoid arthritis/lupus: Continue follow-up with rheumatology Dr. Allena Katz..  INTERVAL HISTORY Victoria [name] is a 62 y.o. female who has above history  reviewed by me today presents for follow up visit for management of history of DCIS.  Patient takes letrozole,  Patient has Mediport, flush every 8 weeks.   She finished 5 years of treatment and has been off letrozole treatment since Feb 2023 During the interval, unprovoked LLE DVT of common femoral, femoral, popliteal and peroneal (1 of 2) veins on  07/17/22 . she missed a few doses of pradaxa. Treated initially with heparin and then transitioned to rivaroxaban with loading regimen prior to discharge.  08/01/22 new acute subsegmental PE in posterior basal RLL with interval resolution of prior LLL subsegmental PE on 08/02/22, . she was treated with Lovenox 60mg  BID. She follows up with hematology at [facility], according to hematology, BLE PVLs on 12/26 did not clearly show worsening DVT from 12/11 and this was not felt to be a Xarelto failure. She was recommend to switched to Xarelto 20mg  daily.     MEDICAL HISTORY:  Past Medical History:  Diagnosis Date   Allergy    Asthma    Cancer (HCC)    Breast Left, DCIS 2018   Collagen vascular disease (HCC)    Lupus   COPD (chronic obstructive pulmonary disease) (HCC)    Diverticulitis    Dysplastic nevus 02/12/2021   right inf lat buttock dysplastic compound nevus with moderate atypia, limited margins free   Dysplastic nevus 02/12/2021   right inf lat buttock at buttocks crease dysplastic compuond nevus with moderate atypia close to margin   Dysplastic nevus 06/22/2021   Severe, R inf med scapula, Excised 08/16/21   Dysplastic nevus 06/22/2021   Severe, R mid back lat, Excised 08/23/2021, margins free.   Dysplastic nevus 06/22/2021   Mod to Severe, R low back lat, recheck at next appt   Dysplastic nevus 06/22/2021   L med calf - severe. Re-shaved 08/30/2021, margins free.   Fibromyalgia    GERD (gastroesophageal reflux disease)    Headache    Migraines   Hearing impaired person, left    bilateral hearing aids   History of dysplastic nevus 02/12/2021   right inf buttock no residual dysplastic nevus margins free   IBS (irritable bowel syndrome)    Lupus (HCC)    Mitral valve disorder    MS (multiple sclerosis) (HCC)    Neuromuscular disorder (HCC)    Neuropathy    Osteoarthritis    Osteoporosis 05/12/2019   Personal history of radiation therapy 2017   left    Rheumatoid arthritis  (HCC)    hands and legs   Seizures (HCC)    no seizures for 10 years    SURGICAL HISTORY: Past Surgical History:  Procedure Laterality Date   ABDOMINAL HYSTERECTOMY     APPENDECTOMY     BREAST EXCISIONAL BIOPSY Left 2017   lumpectomy with radation   BREAST EXCISIONAL BIOPSY Left 2004   neg surgical bx    BREAST LUMPECTOMY Left 2017   DCIS   BREAST SURGERY     CHOLECYSTECTOMY     COLONOSCOPY WITH PROPOFOL N/A 07/07/2019   Procedure: COLONOSCOPY WITH PROPOFOL;  Surgeon: Toledo, Boykin Nearing, MD;  Location: [facility] ENDOSCOPY;  Service: Gastroenterology;  Laterality: N/A;   DIRECT LARYNGOSCOPY N/A 09/22/2020   Procedure: SUSPENSION MICRODIRECT LARYNGOSCOPY WITH BIOPSY;  Surgeon: Bud Face, MD;  [facility];  Service: ENT;  Laterality: N/A;  has port a cath needs accessed   ESOPHAGOGASTRODUODENOSCOPY (EGD) WITH PROPOFOL N/A 07/07/2019   Procedure: ESOPHAGOGASTRODUODENOSCOPY (EGD) WITH PROPOFOL;  Surgeon: Norma Fredrickson, Boykin Nearing, MD;  Location: [facility] ENDOSCOPY;  Service: Gastroenterology;  Laterality: N/A;   PATENT DUCTUS ARTERIOUS REPAIR     RECONSTRUCTION TENDON PULLEY HAND Left    TOTAL HIP ARTHROPLASTY Right     SOCIAL HISTORY: Social History   Socioeconomic History   Marital status: Widowed    Spouse name: Not on file   Number of children: 1   Years of education: Not on file   Highest education level: Some college, no degree  Occupational History   Not on file  Tobacco Use   Smoking status: Former    Current packs/day: 0.00    Average packs/day: 2.0 packs/day for 15.0 years (30.0 ttl pk-yrs)    Types: Cigarettes    Start date: 06/26/1984    Quit date: 06/27/1999    Years since quitting: 23.8   Smokeless tobacco: Never  Vaping Use   Vaping status: Never Used  Substance and Sexual Activity   Alcohol use: Yes    Comment: Rarely - approx 6 beers once a month   Drug use: Never   Sexual activity: Not Currently  Other Topics Concern   Not on file  Social  History Narrative   Not on file   Social Determinants of Health   Financial Resource Strain: Low Risk  (09/03/2021)   Received from [facility], [facility]   Overall Financial Resource Strain (CARDIA)    Difficulty of Paying Living Expenses: Not hard at all  Food Insecurity: No Food Insecurity (09/03/2021)   Received from [facility], [facility]   Hunger Vital Sign    Worried About Running Out of Food in the Last Year: Never true    Ran Out of Food in the Last Year: Never true  Transportation Needs: No Transportation Needs (09/03/2021)   Received from [facility], [facility]   [facility] - Transportation    Lack of Transportation (Medical): No    Lack of Transportation (Non-Medical): No  Physical Activity: Sufficiently Active (11/15/2022)   Received from [facility] [place] [facility], [facility]   Exercise Vital Sign    Days of Exercise per Week: 7 days    Minutes of Exercise per Session: 30 min  Stress: No Stress Concern Present (11/15/2022)   Received from [facility], [facility] - Occupational Stress Questionnaire    Feeling of Stress : Not at all  Social Connections: Moderately Integrated (11/15/2022)   Received from [facility], [facility]   Social Connection and Isolation Panel [NHANES]    Frequency of Communication with Friends and Family: More than three times a week    Frequency of Social Gatherings with Friends and Family: More than three times a week    Attends Religious Services: More than 4 times per year    Active Member of [employer] or Organizations: Yes    Attends Banker Meetings: More than 4 times per year    Marital Status: Widowed  Intimate Partner Violence: Not At Risk (07/20/2021)   Received from [facility], [facility]   Humiliation, Afraid, Rape, and Kick questionnaire    Fear of Current or Ex-Partner: No    Emotionally Abused: No    Physically Abused: No     Sexually Abused: No    FAMILY HISTORY: Family History  Problem Relation Age of Onset   COPD Mother    Stroke Father    Arthritis Brother    Diabetes Brother  Stroke Brother    Breast cancer Neg Hx     ALLERGIES:  is allergic to other, sodium hypochlorite, tape, codeine, flagyl [metronidazole], levofloxacin, and sulfa antibiotics.  MEDICATIONS:  Current Outpatient Medications  Medication Sig Dispense Refill   albuterol (VENTOLIN HFA) 108 (90 Base) MCG/ACT inhaler Inhale into the lungs.     Ascorbic Acid (VITAMIN C) 1000 MG tablet Take 1,000 mg by mouth daily.     aspirin EC 81 MG tablet Take by mouth.     atorvastatin (LIPITOR) 40 MG tablet Take 40 mg by mouth daily.     azelastine (ASTELIN) 0.1 % nasal spray Place 2 sprays into both nostrils 2 (two) times daily.     benzonatate (TESSALON) 200 MG capsule TAKE 1 CAPSULE BY MOUTH 3 TIMES A DAY ASNEEDED FOR COUGH FOR UP TO SEVEN DAYS     budesonide-formoterol (SYMBICORT) 160-4.5 MCG/ACT inhaler Inhale 2 puffs into the lungs 2 (two) times daily. 1 Inhaler 12   cholecalciferol (VITAMIN D3) 25 MCG (1000 UNIT) tablet Take 1,000 Units by mouth daily.     cyclobenzaprine (FLEXERIL) 10 MG tablet TAKE 1 TABLET BY MOUTH TWICE A DAY AS NEEDED FOR MUSCLE SPASMS 40 tablet 0   dabigatran (PRADAXA) 150 MG CAPS capsule Take 1 capsule (150 mg total) by mouth 2 (two) times daily. 180 capsule 3   fluticasone (FLONASE) 50 MCG/ACT nasal spray Place into both nostrils.     gabapentin (NEURONTIN) 100 MG capsule Take 100 mg by mouth 3 (three) times daily.     gabapentin (NEURONTIN) 300 MG capsule Take 1-2 capsules (300-600 mg total) by mouth 3 (three) times daily. 270 capsule 0   hydroxychloroquine (PLAQUENIL) 200 MG tablet Take 200 mg by mouth daily.     Ipratropium-Albuterol (COMBIVENT RESPIMAT) 20-100 MCG/ACT AERS respimat INHALE 2 PUFFS BY MOUTH INTO THE LUNGS EVERY 4 TIMES DAILY AS NEEDED FOR WHEEZING     JARDIANCE 10 MG TABS tablet Take 10 mg by mouth  daily.     methotrexate 2.5 MG tablet Take 2.5 mg by mouth once a week.     metoprolol succinate (TOPROL-XL) 25 MG 24 hr tablet Take 25 mg by mouth daily.     omeprazole (PRILOSEC) 40 MG capsule Take 1 capsule (40 mg total) by mouth 2 (two) times daily. 60 capsule 6   ondansetron (ZOFRAN) 4 MG tablet Take 1 tablet (4 mg total) by mouth every 8 (eight) hours as needed for up to 10 doses for nausea or vomiting. 20 tablet 0   Tiotropium Bromide Monohydrate (SPIRIVA RESPIMAT) 2.5 MCG/ACT AERS Inhale 2 puffs into the lungs every morning. 1 Inhaler 11   topiramate (TOPAMAX) 50 MG tablet Take 50 mg by mouth 2 (two) times daily.     traZODone (DESYREL) 50 MG tablet Take 3 tablets (150 mg total) by mouth at bedtime. 180 tablet 1   No current facility-administered medications for this visit.   Facility-Administered Medications Ordered in Other Visits  Medication Dose Route Frequency Provider Last Rate Last Admin   heparin lock flush 100 unit/mL  500 Units Intravenous Once Rickard Patience, MD       sodium chloride flush (NS) 0.9 % injection 10 mL  10 mL Intravenous PRN Rickard Patience, MD   10 mL at 01/22/19 1503   Review of Systems  Constitutional:  Negative for appetite change, chills, fatigue and fever.  HENT:   Negative for hearing loss and voice change.   Eyes:  Negative for eye problems.  Respiratory:  Negative for chest tightness and cough.   Cardiovascular:  Negative for chest pain.  Gastrointestinal:  Negative for abdominal distention, abdominal pain and blood in stool.  Endocrine: Negative for hot flashes.  Genitourinary:  Negative for difficulty urinating and frequency.        Genital sore  Musculoskeletal:  Negative for arthralgias.  Skin:  Negative for itching and rash.  Neurological:  Negative for extremity weakness.  Hematological:  Negative for adenopathy.  Psychiatric/Behavioral:  Negative for confusion.      PHYSICAL EXAMINATION: ECOG PERFORMANCE STATUS: 1 - Symptomatic but completely  ambulatory Vitals:   04/16/23 0856  BP: (!) 115/57  Pulse: 69  Resp: 18  Temp: (!) 96 F (35.6 C)  SpO2: 100%   Filed Weights   04/16/23 0856  Weight: 175 lb 6.4 oz (79.6 kg)    Physical Exam Constitutional:      General: She is not in acute distress.    Appearance: She is obese. She is not diaphoretic.  HENT:     Head: Normocephalic and atraumatic.     Nose: Nose normal.     Mouth/Throat:     Pharynx: No oropharyngeal exudate.  Eyes:     General: No scleral icterus.    Pupils: Pupils are equal, round, and reactive to light.  Cardiovascular:     Rate and Rhythm: Normal rate and regular rhythm.     Heart sounds: No murmur heard. Pulmonary:     Effort: Pulmonary effort is normal. No respiratory distress.     Breath sounds: No rales.  Chest:     Chest wall: No tenderness.  Abdominal:     General: There is no distension.     Palpations: Abdomen is soft.     Tenderness: There is no abdominal tenderness.  Musculoskeletal:        General: Normal range of motion.     Cervical back: Normal range of motion and neck supple.  Skin:    General: Skin is warm and dry.     Findings: No erythema.  Neurological:     Mental Status: She is alert and oriented to person, place, and time.     Cranial Nerves: No cranial nerve deficit.     Motor: No abnormal muscle tone.     Coordination: Coordination normal.  Psychiatric:        Mood and Affect: Affect normal.    .   LABORATORY DATA:  I have reviewed the data as listed    Latest Ref Rng & Units 04/16/2023    8:39 AM 04/03/2022   12:57 PM 10/03/2021   12:52 PM  CBC  WBC 4.0 - 10.5 K/uL 3.7  3.4  5.6   Hemoglobin 12.0 - 15.0 g/dL 16.1  09.6  04.5   Hematocrit 36.0 - 46.0 % 38.4  37.8  37.6   Platelets 150 - 400 K/uL 157  116  167       Latest Ref Rng & Units 10/19/2022    1:03 PM 10/12/2022   11:12 AM 04/03/2022   12:57 PM  CMP  Glucose 70 - 99 mg/dL 409  811  914   BUN 6 - 20 mg/dL 17  14  13    Creatinine 0.44 - 1.00 mg/dL  7.82  9.56  2.13   Sodium 135 - 145 mmol/L 135  138  140   Potassium 3.5 - 5.1 mmol/L 3.7  3.5  3.5   Chloride 98 - 111 mmol/L 107  112  112  CO2 22 - 32 mmol/L 23  20  23    Calcium 8.9 - 10.3 mg/dL 8.8  8.5  9.0   Total Protein 6.5 - 8.1 g/dL   6.5   Total Bilirubin 0.3 - 1.2 mg/dL   0.4   Alkaline Phos 38 - 126 U/L   62   AST 15 - 41 U/L   21   ALT 0 - 44 U/L   21     RADIOGRAPHIC STUDIES: No results found.

## 2023-04-16 NOTE — Assessment & Plan Note (Addendum)
history of left DICS-2017,S/p lumpectomy and RT.  Patient has completed 5+ years of letrozole [07/2016-09/2021] Continue annual mammogram-due March 2025

## 2023-04-16 NOTE — Addendum Note (Signed)
Addended by: Coralee Rud on: 04/16/2023 11:39 AM   Modules accepted: Orders

## 2023-04-16 NOTE — Patient Instructions (Signed)

## 2023-04-16 NOTE — Assessment & Plan Note (Signed)
due to lack of peripheral access. Continue port flush every 8 weeks

## 2023-04-16 NOTE — Assessment & Plan Note (Signed)
Osteoporosis,  Continue calcium and vitamin D Proceed with Zometa today May 2024  bone density showed stable osteoporosis left femur neck T- 2.9,stable.  continue bone density every 2 years- May 2026

## 2023-05-24 ENCOUNTER — Ambulatory Visit: Payer: 59 | Admitting: Dermatology

## 2023-05-24 VITALS — BP 126/70

## 2023-05-24 DIAGNOSIS — L578 Other skin changes due to chronic exposure to nonionizing radiation: Secondary | ICD-10-CM

## 2023-05-24 DIAGNOSIS — W908XXA Exposure to other nonionizing radiation, initial encounter: Secondary | ICD-10-CM | POA: Diagnosis not present

## 2023-05-24 DIAGNOSIS — D492 Neoplasm of unspecified behavior of bone, soft tissue, and skin: Secondary | ICD-10-CM | POA: Diagnosis not present

## 2023-05-24 DIAGNOSIS — Z1283 Encounter for screening for malignant neoplasm of skin: Secondary | ICD-10-CM | POA: Diagnosis not present

## 2023-05-24 DIAGNOSIS — D2271 Melanocytic nevi of right lower limb, including hip: Secondary | ICD-10-CM | POA: Diagnosis not present

## 2023-05-24 DIAGNOSIS — Z86018 Personal history of other benign neoplasm: Secondary | ICD-10-CM

## 2023-05-24 DIAGNOSIS — D1801 Hemangioma of skin and subcutaneous tissue: Secondary | ICD-10-CM

## 2023-05-24 DIAGNOSIS — L814 Other melanin hyperpigmentation: Secondary | ICD-10-CM

## 2023-05-24 DIAGNOSIS — L821 Other seborrheic keratosis: Secondary | ICD-10-CM

## 2023-05-24 DIAGNOSIS — D229 Melanocytic nevi, unspecified: Secondary | ICD-10-CM

## 2023-05-24 NOTE — Progress Notes (Signed)
   Follow-Up Visit   Subjective  Victoria [name] is a 62 y.o. female who presents for the following: Skin Cancer Screening and Full Body Skin Exam, hx of Dysplastic Nevi  The patient presents for Total-Body Skin Exam (TBSE) for skin cancer screening and mole check. The patient has spots, moles and lesions to be evaluated, some may be new or changing and the patient may have concern these could be cancer.    The following portions of the chart were reviewed this encounter and updated as appropriate: medications, allergies, medical history  Review of Systems:  No other skin or systemic complaints except as noted in HPI or Assessment and Plan.  Objective  Well appearing patient in no apparent distress; mood and affect are within normal limits.  A full examination was performed including scalp, head, eyes, ears, nose, lips, neck, chest, axillae, abdomen, back, buttocks, bilateral upper extremities, bilateral lower extremities, hands, feet, fingers, toes, fingernails, and toenails. All findings within normal limits unless otherwise noted below.   Relevant physical exam findings are noted in the Assessment and Plan.  R proximal ant lat thigh 0.6cm irregular brown macule         Assessment & Plan   SKIN CANCER SCREENING PERFORMED TODAY.  ACTINIC DAMAGE - Chronic condition, secondary to cumulative UV/sun exposure - diffuse scaly erythematous macules with underlying dyspigmentation - Recommend daily broad spectrum sunscreen SPF 30+ to sun-exposed areas, reapply every 2 hours as needed.  - Staying in the shade or wearing long sleeves, sun glasses (UVA+UVB protection) and wide brim hats (4-inch brim around the entire circumference of the hat) are also recommended for sun protection.  - Call for new or changing lesions.  LENTIGINES, SEBORRHEIC KERATOSES, HEMANGIOMAS - Benign normal skin lesions - Benign-appearing - Call for any changes  MELANOCYTIC NEVI - Tan-brown and/or  pink-flesh-colored symmetric macules and papules - Benign appearing on exam today - Observation - Call clinic for new or changing moles - Recommend daily use of broad spectrum spf 30+ sunscreen to sun-exposed areas.   HISTORY OF DYSPLASTIC NEVUS No evidence of recurrence today Recommend regular full body skin exams Recommend daily broad spectrum sunscreen SPF 30+ to sun-exposed areas, reapply every 2 hours as needed.  Call if any new or changing lesions are noted between office visits  - Multiple   Neoplasm of skin R proximal ant lat thigh  Epidermal / dermal shaving  Lesion diameter (cm):  0.6 Informed consent: discussed and consent obtained   Timeout: patient name, date of birth, surgical site, and procedure verified   Procedure prep:  Patient was prepped and draped in usual sterile fashion Prep type:  Isopropyl alcohol Anesthesia: the lesion was anesthetized in a standard fashion   Anesthetic:  1% lidocaine w/ epinephrine 1-100,000 buffered w/ 8.4% NaHCO3 Instrument used: flexible razor blade   Hemostasis achieved with: pressure, aluminum chloride and electrodesiccation   Outcome: patient tolerated procedure well   Post-procedure details: sterile dressing applied and wound care instructions given   Dressing type: bandage and bacitracin    Specimen 1 - Surgical pathology Differential Diagnosis: D48.5 Nevus vs Dysplastic Nevus  Check Margins: yes 0.6cm irregular brown macule   Return in about 1 year (around 05/23/2024) for TBSE, Hx of Dysplastic nevi.  I, Ardis Rowan, RMA, am acting as scribe for Armida Sans, MD .   Documentation: I have reviewed the above documentation for accuracy and completeness, and I agree with the above.  Armida Sans, MD

## 2023-05-24 NOTE — Patient Instructions (Addendum)

## 2023-05-28 LAB — SURGICAL PATHOLOGY

## 2023-05-29 ENCOUNTER — Telehealth: Payer: Self-pay

## 2023-05-29 NOTE — Telephone Encounter (Signed)
-----   Message from Armida Sans sent at 05/29/2023 12:14 PM EDT ----- FINAL DIAGNOSIS        1. Skin, R proximal ant lat thigh :       JUNCTIONAL DYSPLASTIC MELANOCYTIC NEVUS WITH MODERATE TO SEVERE ATYPIA,       PERIPHERAL MARGIN INVOLVED, SEE DESCRIPTION   Severe dysplastic Schedule for surgery

## 2023-05-29 NOTE — Telephone Encounter (Signed)
Discussed biopsy result with patient

## 2023-06-09 ENCOUNTER — Encounter: Payer: Self-pay | Admitting: Dermatology

## 2023-06-11 ENCOUNTER — Inpatient Hospital Stay: Payer: 59

## 2023-06-12 ENCOUNTER — Inpatient Hospital Stay: Payer: 59 | Attending: Oncology

## 2023-06-12 DIAGNOSIS — Z452 Encounter for adjustment and management of vascular access device: Secondary | ICD-10-CM | POA: Insufficient documentation

## 2023-06-12 DIAGNOSIS — M810 Age-related osteoporosis without current pathological fracture: Secondary | ICD-10-CM | POA: Diagnosis not present

## 2023-06-12 DIAGNOSIS — Z95828 Presence of other vascular implants and grafts: Secondary | ICD-10-CM

## 2023-06-12 DIAGNOSIS — Z86000 Personal history of in-situ neoplasm of breast: Secondary | ICD-10-CM | POA: Diagnosis not present

## 2023-06-12 MED ORDER — HEPARIN SOD (PORK) LOCK FLUSH 100 UNIT/ML IV SOLN
500.0000 [IU] | Freq: Once | INTRAVENOUS | Status: AC
  Administered 2023-06-12: 500 [IU] via INTRAVENOUS
  Filled 2023-06-12: qty 5

## 2023-06-12 MED ORDER — SODIUM CHLORIDE 0.9% FLUSH
10.0000 mL | Freq: Once | INTRAVENOUS | Status: AC
  Administered 2023-06-12: 10 mL via INTRAVENOUS
  Filled 2023-06-12: qty 10

## 2023-07-10 ENCOUNTER — Ambulatory Visit: Payer: 59 | Admitting: Dermatology

## 2023-07-10 ENCOUNTER — Encounter: Payer: Self-pay | Admitting: Dermatology

## 2023-07-10 DIAGNOSIS — D492 Neoplasm of unspecified behavior of bone, soft tissue, and skin: Secondary | ICD-10-CM

## 2023-07-10 DIAGNOSIS — D2371 Other benign neoplasm of skin of right lower limb, including hip: Secondary | ICD-10-CM | POA: Diagnosis not present

## 2023-07-10 DIAGNOSIS — D239 Other benign neoplasm of skin, unspecified: Secondary | ICD-10-CM

## 2023-07-10 MED ORDER — MUPIROCIN 2 % EX OINT: 1.0000 | TOPICAL_OINTMENT | Freq: Every day | CUTANEOUS | 0 refills | Status: AC

## 2023-07-10 NOTE — Patient Instructions (Addendum)
Wound Care Instructions  On the day following your surgery, you should begin doing daily dressing changes: Remove the old dressing and discard it. Cleanse the wound gently with tap water. This may be done in the shower or by placing a wet gauze pad directly on the wound and letting it soak for several minutes. It is important to gently remove any dried blood from the wound in order to encourage healing. This may be done by gently rolling a moistened Q-tip on the dried blood. Do not pick at the wound. If the wound should start to bleed, continue cleaning the wound, then place a moist gauze pad on the wound and hold pressure for a few minutes.  Make sure you then dry the skin surrounding the wound completely or the tape will not stick to the skin. Do not use cotton balls on the wound. After the wound is clean and dry, apply the ointment gently with a Q-tip. Cut a non-stick pad to fit the size of the wound. Lay the pad flush to the wound. If the wound is draining, you may want to reinforce it with a small amount of gauze on top of the non-stick pad for a little added compression to the area. Use the tape to seal the area completely. Select from the following with respect to your individual situation: If your wound has been stitched closed: continue the above steps 1-8 at least daily until your sutures are removed. If your wound has been left open to heal: continue steps 1-8 at least daily for the first 3-4 weeks. We would like for you to take a few extra precautions for at least the next week. Sleep with your head elevated on pillows if our wound is on your head. Do not bend over or lift heavy items to reduce the chance of elevated blood pressure to the wound Do not participate in particularly strenuous activities.   Below is a list of dressing supplies you might need.  Cotton-tipped applicators - Q-tips Gauze pads (2x2 and/or 4x4) - All-Purpose Sponges Non-stick dressing material - Telfa Tape -  Paper or Hypafix New and clean tube of petroleum jelly - Vaseline    Comments on Post-Operative Period Slight swelling and redness often appear around the wound. This is normal and will disappear within several days following the surgery. The healing wound will drain a brownish-red-yellow discharge during healing. This is a normal phase of wound healing. As the wound begins to heal, the drainage may increase in amount. Again, this drainage is normal. Notify us if the drainage becomes persistently bloody, excessively swollen, or intensely painful or develops a foul odor or red streaks.  If you should experience mild discomfort during the healing phase, you may take an aspirin-free medication such as Tylenol (acetaminophen). Notify us if the discomfort is severe or persistent. Avoid alcoholic beverages when taking pain medicine.  In Case of Wound Hemorrhage A wound hemorrhage is when the bandage suddenly becomes soaked with bright red blood and flows profusely. If this happens, sit down or lie down with your head elevated. If the wound has a dressing on it, do not remove the dressing. Apply pressure to the existing gauze. If the wound is not covered, use a gauze pad to apply pressure and continue applying the pressure for 20 minutes without peeking. DO NOT COVER THE WOUND WITH A LARGE TOWEL OR WASH CLOTH. Release your hand from the wound site but do not remove the dressing. If the bleeding has stopped,  gently clean around the wound. Leave the dressing in place for 24 hours if possible. This wait time allows the blood vessels to close off so that you do not spark a new round of bleeding by disrupting the newly clotted blood vessels with an immediate dressing change. If the bleeding does not subside, continue to hold pressure. If matters are out of your control, contact an After Hours clinic or go to the Emergency Room.  Due to recent changes in healthcare laws, you may see results of your pathology and/or  laboratory studies on MyChart before the doctors have had a chance to review them. We understand that in some cases there may be results that are confusing or concerning to you. Please understand that not all results are received at the same time and often the doctors may need to interpret multiple results in order to provide you with the best plan of care or course of treatment. Therefore, we ask that you please give [place] 2 business days to thoroughly review all your results before contacting the office for clarification. Should we see a critical lab result, you will be contacted sooner.   If You Need Anything After Your Visit  If you have any questions or concerns for your doctor, please call our main line at [phone] and press option 4 to reach your doctor's medical assistant. If no one answers, please leave a voicemail as directed and we will return your call as soon as possible. Messages left after 4 pm will be answered the following business day.   You may also send [place] a message via MyChart. We typically respond to MyChart messages within 1-2 business days.  For prescription refills, please ask your pharmacy to contact our office. Our fax number is [phone].  If you have an urgent issue when the clinic is closed that cannot wait until the next business day, you can page your doctor at the number below.    Please note that while we do our best to be available for urgent issues outside of office hours, we are not available 24/7.   If you have an urgent issue and are unable to reach [place], you may choose to seek medical care at your doctor's office, retail clinic, urgent care center, or emergency room.  If you have a medical emergency, please immediately call 911 or go to the emergency department.  Pager Numbers  - Dr. Gwen Pounds: [phone]  - Dr. Roseanne Reno: [phone]  - Dr. Katrinka Blazing: [phone]   In the event of inclement weather, please call our main line at [phone] for an  update on the status of any delays or closures.  Dermatology Medication Tips: Please keep the boxes that topical medications come in in order to help keep track of the instructions about where and how to use these. Pharmacies typically print the medication instructions only on the boxes and not directly on the medication tubes.   If your medication is too expensive, please contact our office at [phone] option 4 or send [place] a message through MyChart.   We are unable to tell what your co-pay for medications will be in advance as this is different depending on your insurance coverage. However, we may be able to find a substitute medication at lower cost or fill out paperwork to get insurance to cover a needed medication.   If a prior authorization is required to get your medication covered by your insurance company, please allow [place] 1-2 business days to complete this process.  Drug prices often vary depending on where the prescription is filled and some pharmacies may offer cheaper prices.  The website www.goodrx.com contains coupons for medications through different pharmacies. The prices here do not account for what the cost may be with help from insurance (it may be cheaper with your insurance), but the website can give you the price if you did not use any insurance.  - You can print the associated coupon and take it with your prescription to the pharmacy.  - You may also stop by our office during regular business hours and pick up a GoodRx coupon card.  - If you need your prescription sent electronically to a different pharmacy, notify our office through [facility] or by phone at [phone] option 4.     Si Usted Necesita Algo Despus de Su Visita  Tambin puede enviarnos un mensaje a travs de Clinical cytogeneticist. Por lo general respondemos a los mensajes de MyChart en el transcurso de 1 a 2 das hbiles.  Para renovar recetas, por favor pida a su farmacia que se ponga en contacto con  nuestra oficina. Annie Sable de fax es [place] [phone].  Si tiene un asunto urgente cuando la clnica est cerrada y que no puede esperar hasta el siguiente da hbil, puede llamar/localizar a su doctor(a) al nmero que aparece a continuacin.   Por favor, tenga en cuenta que aunque hacemos todo lo posible para estar disponibles para asuntos urgentes fuera del horario de [place], no estamos disponibles las 24 horas del da, los 7 [address]  [address] de la [place].   Si tiene un problema urgente y no puede comunicarse con nosotros, puede optar por buscar atencin mdica  en el consultorio de su doctor(a), en una clnica privada, en un centro de atencin urgente o en una sala de emergencias.  Si tiene Engineer, drilling, por favor llame inmediatamente al 911 o vaya a la sala de emergencias.  Nmeros de bper  - Dr. Gwen Pounds: [phone]  - Dra. Roseanne Reno: [phone]  - Dr. Katrinka Blazing: [phone]   En caso de inclemencias del tiempo, por favor llame a Lacy Duverney principal al [phone] para una actualizacin sobre el [place] de cualquier retraso o cierre.  Consejos para la medicacin en dermatologa: Por favor, guarde las cajas en las que vienen los medicamentos de uso tpico para ayudarle a seguir las instrucciones sobre dnde y cmo usarlos. Las farmacias generalmente imprimen las instrucciones del medicamento slo en las cajas y no directamente en los tubos del [place].   Si su medicamento es muy caro, por favor, pngase en contacto con Rolm Gala llamando al [phone] y presione la opcin 4 o envenos un mensaje a travs de Clinical cytogeneticist.   No podemos decirle cul ser su copago por los medicamentos por adelantado ya que esto es diferente dependiendo de la cobertura de su seguro. Sin embargo, es posible que podamos encontrar un medicamento sustituto a Audiological scientist un formulario para que el seguro cubra el medicamento que se considera necesario.   Si se requiere una autorizacin  previa para que su compaa de seguros [place] su medicamento, por favor permtanos de 1 a 2 das hbiles para completar [address]  Los precios de los medicamentos varan con frecuencia dependiendo del Environmental consultant de dnde se surte la receta y alguna farmacias pueden ofrecer precios ms baratos.  El sitio web www.goodrx.com tiene cupones para medicamentos de Health and safety inspector. Los precios aqu no tienen en cuenta lo que podra costar con la ayuda del seguro (puede ser  ms barato con su seguro), pero el sitio web puede darle el precio si no Visual merchandiser.  - Puede imprimir el cupn correspondiente y llevarlo con su receta a la farmacia.  - Tambin puede pasar por nuestra oficina durante el horario de atencin regular y Education officer, museum una tarjeta de cupones de GoodRx.  - Si necesita que su receta se enve electrnicamente a una farmacia diferente, informe a nuestra oficina a travs de MyChart de [place] o por telfono llamando al [phone] y presione la opcin 4.

## 2023-07-10 NOTE — Progress Notes (Signed)
Follow-Up Visit   Subjective  Victoria [name] is a 62 y.o. female who presents for the following: moderate to severe dysplastic nevus bx proven, R proximal ant lat hip, pt presents for excision The patient has spots, moles and lesions to be evaluated, some may be new or changing and the patient may have concern these could be cancer.   The following portions of the chart were reviewed this encounter and updated as appropriate: medications, allergies, medical history  Review of Systems:  No other skin or systemic complaints except as noted in HPI or Assessment and Plan.  Objective  Well appearing patient in no apparent distress; mood and affect are within normal limits.   A focused examination was performed of the following areas: Right hip  Relevant exam findings are noted in the Assessment and Plan.  R proximal ant lat thigh Pink bx site 1.5 x 1.5cm  L proximal tricep near deltoid Brown dimpling macule    Assessment & Plan     Dysplastic nevus R proximal ant lat thigh  Moderate to severe dysplastic nevus bx proven, excised today Start Mupirocin oint qd to excision site  Skin excision - R proximal ant lat thigh  Lesion length (cm):  1.5 Lesion width (cm):  1.5 Margin per side (cm):  0.2 Total excision diameter (cm):  1.9 Informed consent: discussed and consent obtained   Timeout: patient name, date of birth, surgical site, and procedure verified   Procedure prep:  Patient was prepped and draped in usual sterile fashion Prep type:  Isopropyl alcohol and povidone-iodine Anesthesia: the lesion was anesthetized in a standard fashion   Anesthetic:  1% lidocaine w/ epinephrine 1-100,000 buffered w/ 8.4% NaHCO3 (12cc lido w/ epi, 6cc bupivicaine, Total of 18cc) Instrument used: #15 blade   Hemostasis achieved with: pressure   Hemostasis achieved with comment:  Electrocautery Outcome: patient tolerated procedure well with no complications   Post-procedure details:  sterile dressing applied and wound care instructions given   Dressing type: bandage and pressure dressing (Mupirocin)    Skin repair - R proximal ant lat thigh Complexity:  Complex Final length (cm):  4 Reason for type of repair: reduce tension to allow closure, reduce the risk of dehiscence, infection, and necrosis, reduce subcutaneous dead space and avoid a hematoma, allow closure of the large defect, preserve normal anatomy, preserve normal anatomical and functional relationships and enhance both functionality and cosmetic results   Undermining: area extensively undermined   Undermining comment:  Undermining Defect 1.9cm Subcutaneous layers (deep stitches):  Suture size:  3-0 Suture type: Vicryl (polyglactin 910)   Subcutaneous suture technique: Inverted Dermal. Fine/surface layer approximation (top stitches):  Suture size:  3-0 Suture type: nylon   Stitches: simple running   Suture removal (days):  7 Hemostasis achieved with: pressure Outcome: patient tolerated procedure well with no complications   Post-procedure details: sterile dressing applied and wound care instructions given   Dressing type: bandage, pressure dressing and bacitracin (Mupirocin)    Specimen 1 - Surgical pathology Differential Diagnosis: D48.5 Bx proven moderate to severe dysplastic nevus  Check Margins: yes Pink bx site 1.5 x 1.5cm [id number]  Related Medications mupirocin ointment (BACTROBAN) 2 % Apply 1 Application topically daily. qd to excision site  Neoplasm of skin L proximal tricep near deltoid  Nevus vs Dermatofibroma, plan shave removal/bx on f/u    Return in about 1 week (around 07/17/2023) for suture removal, bx and shave removal L prox tricep near deltoid.  I, Sonya Hupman, RMA,  am acting as scribe for Armida Sans, MD .   Documentation: I have reviewed the above documentation for accuracy and completeness, and I agree with the above.  Armida Sans, MD

## 2023-07-11 ENCOUNTER — Telehealth: Payer: Self-pay

## 2023-07-11 LAB — SURGICAL PATHOLOGY

## 2023-07-11 NOTE — Telephone Encounter (Signed)
Left pt msg to call if any problems after yesterday's surgery.

## 2023-07-11 NOTE — Telephone Encounter (Signed)
Patient returned Sonya's phone call. She is doing good but having a little bleeding. She states the bleeding today has improved since yesterday. If this continues she will call and let us know tomorrow before the weekend. aw

## 2023-07-17 ENCOUNTER — Ambulatory Visit: Payer: 59 | Admitting: Dermatology

## 2023-07-17 DIAGNOSIS — Z86018 Personal history of other benign neoplasm: Secondary | ICD-10-CM

## 2023-07-17 DIAGNOSIS — D2262 Melanocytic nevi of left upper limb, including shoulder: Secondary | ICD-10-CM | POA: Diagnosis not present

## 2023-07-17 DIAGNOSIS — D489 Neoplasm of uncertain behavior, unspecified: Secondary | ICD-10-CM

## 2023-07-17 NOTE — Progress Notes (Signed)
   Follow-Up Visit   Subjective  Victoria [name] is a 62 y.o. female who presents for the following: Suture removal And here for bx of at left proximal tricep  Pathology showed R proximal ant lat thigh :      EXCISION, PERSISTENT DYSPLASTIC NEVUS, MARGINS FREE    The following portions of the chart were reviewed this encounter and updated as appropriate: medications, allergies, medical history  Review of Systems:  No other skin or systemic complaints except as noted in HPI or Assessment and Plan.  Objective  Well appearing patient in no apparent distress; mood and affect are within normal limits.  Areas Examined: Left arm, right thigh  Relevant physical exam findings are noted in the Assessment and Plan.  L proximal tricep near deltoid 0.8 cm firm papule     Assessment & Plan     NEOPLASM OF UNCERTAIN BEHAVIOR L proximal tricep near deltoid Epidermal / dermal shaving  Lesion diameter (cm):  0.8 Informed consent: discussed and consent obtained   Timeout: patient name, date of birth, surgical site, and procedure verified   Procedure prep:  Patient was prepped and draped in usual sterile fashion Prep type:  Isopropyl alcohol Anesthesia: the lesion was anesthetized in a standard fashion   Anesthetic:  1% lidocaine w/ epinephrine 1-100,000 buffered w/ 8.4% NaHCO3 Instrument used: flexible razor blade   Hemostasis achieved with: pressure, aluminum chloride and electrodesiccation   Outcome: patient tolerated procedure well   Post-procedure details: sterile dressing applied and wound care instructions given   Dressing type: bandage and petrolatum   Specimen 1 - Surgical pathology Differential Diagnosis: Nevus vs Dermatofibroma r/o dysplasia   Check Margins: No    Nevus vs Dermatofibroma r/o dysplasia     Encounter for Removal of Sutures - Incision site is clean, dry and intact. - Wound cleansed, sutures removed, wound cleansed and steri strips applied.  - Discussed  pathology results showing R proximal ant lat thigh :      EXCISION, PERSISTENT DYSPLASTIC NEVUS, MARGINS FREE   - Patient advised to keep steri-strips dry until they fall off. - Scars remodel for a full year. - Once steri-strips fall off, patient can apply over-the-counter silicone scar cream once to twice a day to help with scar remodeling if desired. - Patient advised to call with any concerns or if they notice any new or changing lesions.  Return for keep follow up as scheduled in October .  IAsher Muir, CMA, am acting as scribe for Armida Sans, MD.   Documentation: I have reviewed the above documentation for accuracy and completeness, and I agree with the above.  Armida Sans, MD

## 2023-07-17 NOTE — Patient Instructions (Signed)
Biopsy Wound Care Instructions  Leave the original bandage on for 24 hours if possible.  If the bandage becomes soaked or soiled before that time, it is OK to remove it and examine the wound.  A small amount of post-operative bleeding is normal.  If excessive bleeding occurs, remove the bandage, place gauze over the site and apply continuous pressure (no peeking) over the area for 30 minutes. If this does not work, please call our clinic as soon as possible or page your doctor if it is after hours.   Once a day, cleanse the wound with soap and water. It is fine to shower. If a thick crust develops you may use a Q-tip dipped into dilute hydrogen peroxide (mix 1:1 with water) to dissolve it.  Hydrogen peroxide can slow the healing process, so use it only as needed.    After washing, apply petroleum jelly (Vaseline) or an antibiotic ointment if your doctor prescribed one for you, followed by a bandage.    For best healing, the wound should be covered with a layer of ointment at all times. If you are not able to keep the area covered with a bandage to hold the ointment in place, this may mean re-applying the ointment several times a day.  Continue this wound care until the wound has healed and is no longer open.   Itching and mild discomfort is normal during the healing process. However, if you develop pain or severe itching, please call our office.   If you have any discomfort, you can take Tylenol (acetaminophen) or ibuprofen as directed on the bottle. (Please do not take these if you have an allergy to them or cannot take them for another reason).  Some redness, tenderness and white or yellow material in the wound is normal healing.  If the area becomes very sore and red, or develops a thick yellow-green material (pus), it may be infected; please notify us.    If you have stitches, return to clinic as directed to have the stitches removed. You will continue wound care for 2-3 days after the stitches  are removed.   Wound healing continues for up to one year following surgery. It is not unusual to experience pain in the scar from time to time during the interval.  If the pain becomes severe or the scar thickens, you should notify the office.    A slight amount of redness in a scar is expected for the first six months.  After six months, the redness will fade and the scar will soften and fade.  The color difference becomes less noticeable with time.  If there are any problems, return for a post-op surgery check at your earliest convenience.  To improve the appearance of the scar, you can use silicone scar gel, cream, or sheets (such as Mederma or Serica) every night for up to one year. These are available over the counter (without a prescription).  Please call our office at [phone] for any questions or concerns.   Due to recent changes in healthcare laws, you may see results of your pathology and/or laboratory studies on MyChart before the doctors have had a chance to review them. We understand that in some cases there may be results that are confusing or concerning to you. Please understand that not all results are received at the same time and often the doctors may need to interpret multiple results in order to provide you with the best plan of care or course of treatment.  Therefore, we ask that you please give [place] 2 business days to thoroughly review all your results before contacting the office for clarification. Should we see a critical lab result, you will be contacted sooner.   If You Need Anything After Your Visit  If you have any questions or concerns for your doctor, please call our main line at [phone] and press option 4 to reach your doctor's medical assistant. If no one answers, please leave a voicemail as directed and we will return your call as soon as possible. Messages left after 4 pm will be answered the following business day.   You may also send [place] a message via  MyChart. We typically respond to MyChart messages within 1-2 business days.  For prescription refills, please ask your pharmacy to contact our office. Our fax number is [phone].  If you have an urgent issue when the clinic is closed that cannot wait until the next business day, you can page your doctor at the number below.    Please note that while we do our best to be available for urgent issues outside of office hours, we are not available 24/7.   If you have an urgent issue and are unable to reach [place], you may choose to seek medical care at your doctor's office, retail clinic, urgent care center, or emergency room.  If you have a medical emergency, please immediately call 911 or go to the emergency department.  Pager Numbers  - Dr. Gwen Pounds: [phone]  - Dr. Roseanne Reno: [phone]  - Dr. Katrinka Blazing: [phone]   In the event of inclement weather, please call our main line at [phone] for an update on the status of any delays or closures.  Dermatology Medication Tips: Please keep the boxes that topical medications come in in order to help keep track of the instructions about where and how to use these. Pharmacies typically print the medication instructions only on the boxes and not directly on the medication tubes.   If your medication is too expensive, please contact our office at [phone] option 4 or send [place] a message through MyChart.   We are unable to tell what your co-pay for medications will be in advance as this is different depending on your insurance coverage. However, we may be able to find a substitute medication at lower cost or fill out paperwork to get insurance to cover a needed medication.   If a prior authorization is required to get your medication covered by your insurance company, please allow [place] 1-2 business days to complete this process.  Drug prices often vary depending on where the prescription is filled and some pharmacies may offer cheaper  prices.  The website www.goodrx.com contains coupons for medications through different pharmacies. The prices here do not account for what the cost may be with help from insurance (it may be cheaper with your insurance), but the website can give you the price if you did not use any insurance.  - You can print the associated coupon and take it with your prescription to the pharmacy.  - You may also stop by our office during regular business hours and pick up a GoodRx coupon card.  - If you need your prescription sent electronically to a different pharmacy, notify our office through [facility] or by phone at [phone] option 4.  After Suture Removal  If your medical team has placed Steri-Strips (white adhesive strips covering the surgical site to provide extra support): Keep the area dry until they  fall off.  Do not peel them off. Just let them fall off on their own.  If the edges peel up, you can trim them with scissors.   If your team has not placed Steri-Strips: Wash the area daily with soap and water. Then coat the incision site with plain Vaseline and cover with a bandage. Do this daily for 5 days after the sutures are removed. After that, no additional wound care is generally needed.  However, if you would like to help fade the scar, you can apply a silicone scar cream, gel or sheet every night. The scar will remodel for one year after the procedure. If a skin cancer was removed, be sure to keep your appointment with your dermatologist for follow-up and let your dermatology team know if you have any new or changing spots between visits.    Please call our office at [phone] for any questions or concerns.   Si Usted Necesita Algo Despus de Su Visita  Tambin puede enviarnos un mensaje a travs de Clinical cytogeneticist. Por lo general respondemos a los mensajes de MyChart en el transcurso de 1 a 2 das hbiles.  Para renovar recetas, por favor pida a su farmacia que se ponga en  contacto con nuestra oficina. Annie Sable de fax es Mina [phone].  Si tiene un asunto urgente cuando la clnica est cerrada y que no puede esperar hasta el siguiente da hbil, puede llamar/localizar a su doctor(a) al nmero que aparece a continuacin.   Por favor, tenga en cuenta que aunque hacemos todo lo posible para estar disponibles para asuntos urgentes fuera del horario de [place], no estamos disponibles las 24 horas del da, los 7 [address]  [address] de la [place].   Si tiene un problema urgente y no puede comunicarse con nosotros, puede optar por buscar atencin mdica  en el consultorio de su doctor(a), en una clnica privada, en un centro de atencin urgente o en una sala de emergencias.  Si tiene Engineer, drilling, por favor llame inmediatamente al 911 o vaya a la sala de emergencias.  Nmeros de bper  - Dr. Gwen Pounds: [phone]  - Dra. Roseanne Reno: [phone]  - Dr. Katrinka Blazing: [phone]   En caso de inclemencias del tiempo, por favor llame a Lacy Duverney principal al [phone] para una actualizacin sobre el [place] de cualquier retraso o cierre.  Consejos para la medicacin en dermatologa: Por favor, guarde las cajas en las que vienen los medicamentos de uso tpico para ayudarle a seguir las instrucciones sobre dnde y cmo usarlos. Las farmacias generalmente imprimen las instrucciones del medicamento slo en las cajas y no directamente en los tubos del [place].   Si su medicamento es muy caro, por favor, pngase en contacto con Rolm Gala llamando al [phone] y presione la opcin 4 o envenos un mensaje a travs de Clinical cytogeneticist.   No podemos decirle cul ser su copago por los medicamentos por adelantado ya que esto es diferente dependiendo de la cobertura de su seguro. Sin embargo, es posible que podamos encontrar un medicamento sustituto a Audiological scientist un formulario para que el seguro cubra el medicamento que se considera necesario.   Si se requiere una  autorizacin previa para que su compaa de seguros [place] su medicamento, por favor permtanos de 1 a 2 das hbiles para completar [address]  Los precios de los medicamentos varan con frecuencia dependiendo del Environmental consultant de dnde se surte la receta y alguna farmacias pueden ofrecer precios ms baratos.  El sitio web www.goodrx.com  tiene cupones para medicamentos de Health and safety inspector. Los precios aqu no tienen en cuenta lo que podra costar con la ayuda del seguro (puede ser ms barato con su seguro), pero el sitio web puede darle el precio si no utiliz Tourist information centre manager.  - Puede imprimir el cupn correspondiente y llevarlo con su receta a la farmacia.  - Tambin puede pasar por nuestra oficina durante el horario de atencin regular y Education officer, museum una tarjeta de cupones de GoodRx.  - Si necesita que su receta se enve electrnicamente a una farmacia diferente, informe a nuestra oficina a travs de MyChart de [place] o por telfono llamando al [phone] y presione la opcin 4.

## 2023-07-20 ENCOUNTER — Encounter: Payer: Self-pay | Admitting: Dermatology

## 2023-07-20 LAB — SURGICAL PATHOLOGY

## 2023-07-23 ENCOUNTER — Telehealth: Payer: Self-pay

## 2023-07-23 NOTE — Telephone Encounter (Signed)
Patient advised. aw

## 2023-07-23 NOTE — Telephone Encounter (Addendum)
Tried calling patient. No answer. LM for patient return call.   ----- Message from Armida Sans sent at 07/20/2023  3:44 PM EST ----- FINAL DIAGNOSIS        1. Skin, L proximal tricep near deltoid :       MELANOCYTIC NEVUS, INTRADERMAL TYPE   Benign mole No further treatment needed

## 2023-08-06 ENCOUNTER — Inpatient Hospital Stay: Payer: 59 | Attending: Oncology

## 2023-09-05 ENCOUNTER — Encounter: Payer: Self-pay | Admitting: Oncology

## 2023-09-24 ENCOUNTER — Encounter: Payer: Self-pay | Admitting: Oncology

## 2023-10-01 ENCOUNTER — Inpatient Hospital Stay: Payer: 59 | Attending: Oncology

## 2023-10-08 ENCOUNTER — Telehealth: Payer: Self-pay | Admitting: Oncology

## 2023-10-08 NOTE — Telephone Encounter (Signed)
 Left vm for pt about new time for appts on Monday 3/10. From 8:30am to 1pm due to room management to fit in a long chemo.

## 2023-10-11 ENCOUNTER — Telehealth: Payer: Self-pay | Admitting: Oncology

## 2023-10-11 NOTE — Telephone Encounter (Signed)
 Patient called to reschedule appointments form Monday 3/10 to Tuesday 3/11. Appointments rescheduled as requested

## 2023-10-15 ENCOUNTER — Inpatient Hospital Stay

## 2023-10-15 ENCOUNTER — Ambulatory Visit: Payer: 59

## 2023-10-15 ENCOUNTER — Other Ambulatory Visit: Payer: 59

## 2023-10-17 ENCOUNTER — Inpatient Hospital Stay

## 2023-10-25 ENCOUNTER — Telehealth: Payer: Self-pay | Admitting: *Deleted

## 2023-10-25 NOTE — Telephone Encounter (Signed)
 She has to get labs and then have zometa. I told her that it is best to staty home and if it continues maybe see PCP. She is ok with that . I will ask the scheduler to give another appt. She does NOT want thursdays

## 2023-10-26 ENCOUNTER — Inpatient Hospital Stay

## 2023-11-02 ENCOUNTER — Inpatient Hospital Stay

## 2023-11-02 ENCOUNTER — Inpatient Hospital Stay: Attending: Oncology

## 2023-11-02 VITALS — BP 104/58 | HR 74 | Temp 97.3°F | Resp 18

## 2023-11-02 DIAGNOSIS — D649 Anemia, unspecified: Secondary | ICD-10-CM | POA: Diagnosis present

## 2023-11-02 DIAGNOSIS — Z86000 Personal history of in-situ neoplasm of breast: Secondary | ICD-10-CM | POA: Diagnosis present

## 2023-11-02 DIAGNOSIS — Z95828 Presence of other vascular implants and grafts: Secondary | ICD-10-CM

## 2023-11-02 LAB — BASIC METABOLIC PANEL - CANCER CENTER ONLY
Anion gap: 9 (ref 5–15)
BUN: 19 mg/dL (ref 6–20)
CO2: 25 mmol/L (ref 22–32)
Calcium: 8.5 mg/dL — ABNORMAL LOW (ref 8.9–10.3)
Chloride: 105 mmol/L (ref 98–111)
Creatinine: 0.7 mg/dL (ref 0.44–1.00)
GFR, Estimated: >60 mL/min (ref >60)
Glucose, Bld: 98 mg/dL (ref 70–99)
Potassium: 3.6 mmol/L (ref 3.5–5.1)
Sodium: 139 mmol/L (ref 135–145)

## 2023-11-02 MED ORDER — HEPARIN SOD (PORK) LOCK FLUSH 100 UNIT/ML IV SOLN
500.0000 [IU] | Freq: Once | INTRAVENOUS | Status: AC
Start: 2023-11-02 — End: 2023-11-02
  Administered 2023-11-02: 500 [IU] via INTRAVENOUS
  Filled 2023-11-02: qty 5

## 2023-11-02 NOTE — Progress Notes (Signed)
 Per Dr. Cathie Hoops, hold Zometa today. Ca=8.5.

## 2023-11-22 ENCOUNTER — Encounter: Payer: Self-pay | Admitting: Internal Medicine

## 2023-11-26 ENCOUNTER — Inpatient Hospital Stay: Payer: 59 | Attending: Oncology

## 2023-11-26 DIAGNOSIS — Z08 Encounter for follow-up examination after completed treatment for malignant neoplasm: Secondary | ICD-10-CM | POA: Diagnosis present

## 2023-11-26 DIAGNOSIS — Z95828 Presence of other vascular implants and grafts: Secondary | ICD-10-CM

## 2023-11-26 DIAGNOSIS — Z86000 Personal history of in-situ neoplasm of breast: Secondary | ICD-10-CM | POA: Insufficient documentation

## 2023-11-26 MED ORDER — HEPARIN SOD (PORK) LOCK FLUSH 100 UNIT/ML IV SOLN
500.0000 [IU] | Freq: Once | INTRAVENOUS | Status: AC
Start: 2023-11-26 — End: 2023-11-26
  Administered 2023-11-26: 500 [IU] via INTRAVENOUS
  Filled 2023-11-26: qty 5

## 2023-11-26 MED ORDER — SODIUM CHLORIDE 0.9% FLUSH
10.0000 mL | Freq: Once | INTRAVENOUS | Status: AC
  Administered 2023-11-26: 10 mL via INTRAVENOUS
  Filled 2023-11-26: qty 10

## 2023-12-07 ENCOUNTER — Other Ambulatory Visit: Payer: Self-pay | Admitting: Otolaryngology

## 2023-12-07 DIAGNOSIS — R1319 Other dysphagia: Secondary | ICD-10-CM

## 2023-12-07 DIAGNOSIS — D491 Neoplasm of unspecified behavior of respiratory system: Secondary | ICD-10-CM

## 2023-12-10 ENCOUNTER — Ambulatory Visit
Admission: RE | Admit: 2023-12-10 | Discharge: 2023-12-10 | Disposition: A | Discharge status: Home / Self Care | Payer: 59 | Source: Ambulatory Visit | Attending: Oncology | Admitting: Oncology

## 2023-12-10 DIAGNOSIS — Z1231 Encounter for screening mammogram for malignant neoplasm of breast: Secondary | ICD-10-CM | POA: Diagnosis not present

## 2023-12-10 DIAGNOSIS — Z86000 Personal history of in-situ neoplasm of breast: Secondary | ICD-10-CM | POA: Insufficient documentation

## 2023-12-11 ENCOUNTER — Ambulatory Visit
Admission: RE | Admit: 2023-12-11 | Discharge: 2023-12-11 | Disposition: A | Discharge status: Home / Self Care | Source: Ambulatory Visit | Attending: Otolaryngology | Admitting: Otolaryngology

## 2023-12-11 ENCOUNTER — Telehealth: Payer: Self-pay | Admitting: *Deleted

## 2023-12-11 DIAGNOSIS — R1319 Other dysphagia: Secondary | ICD-10-CM

## 2023-12-11 DIAGNOSIS — D491 Neoplasm of unspecified behavior of respiratory system: Secondary | ICD-10-CM

## 2023-12-11 NOTE — Telephone Encounter (Signed)
 I spoke to Victoria [name] and I told her that yes that is fine with Dr. Wilhelmenia Harada that she can do all of the accessing the port needle and then taking it out and using the port and then she needs to be accessed with the heparin .  She is good for that

## 2023-12-12 ENCOUNTER — Other Ambulatory Visit

## 2023-12-13 ENCOUNTER — Ambulatory Visit
Admission: RE | Admit: 2023-12-13 | Discharge: 2023-12-13 | Disposition: A | Discharge status: Home / Self Care | Source: Ambulatory Visit | Attending: Otolaryngology | Admitting: Otolaryngology

## 2023-12-13 MED ORDER — SODIUM CHLORIDE 0.9% FLUSH
10.0000 mL | INTRAVENOUS | Status: DC | PRN
  Administered 2023-12-13 (×2): 10 mL via INTRAVENOUS

## 2023-12-13 MED ORDER — HEPARIN SOD (PORK) LOCK FLUSH 100 UNIT/ML IV SOLN
500.0000 [IU] | Freq: Once | INTRAVENOUS | Status: AC
  Administered 2023-12-13: 500 [IU] via INTRAVENOUS

## 2023-12-13 MED ORDER — IOPAMIDOL (ISOVUE-300) INJECTION 61%
75.0000 mL | Freq: Once | INTRAVENOUS | Status: AC | PRN
  Administered 2023-12-13: 75 mL via INTRAVENOUS

## 2023-12-20 ENCOUNTER — Encounter: Payer: Self-pay | Admitting: Internal Medicine

## 2024-01-02 ENCOUNTER — Ambulatory Visit: Admission: RE | Admit: 2024-01-02 | Source: Home / Self Care | Admitting: Internal Medicine

## 2024-01-02 HISTORY — DX: Other diseases of vocal cords: J38.3

## 2024-01-02 HISTORY — DX: Dysphonia: R49.0

## 2024-01-02 HISTORY — DX: Dysphagia, pharyngoesophageal phase: R13.14

## 2024-01-02 SURGERY — COLONOSCOPY
Anesthesia: General

## 2024-01-21 ENCOUNTER — Inpatient Hospital Stay: Payer: 59 | Attending: Oncology

## 2024-01-22 ENCOUNTER — Encounter
Admission: RE | Disposition: A | Discharge status: Home / Self Care | Payer: Self-pay | Source: Home / Self Care | Attending: Internal Medicine

## 2024-01-22 ENCOUNTER — Ambulatory Visit: Admitting: Anesthesiology

## 2024-01-22 ENCOUNTER — Encounter: Payer: Self-pay | Admitting: Internal Medicine

## 2024-01-22 ENCOUNTER — Ambulatory Visit
Admission: RE | Admit: 2024-01-22 | Discharge: 2024-01-22 | Disposition: A | Discharge status: Home / Self Care | Attending: Internal Medicine | Admitting: Internal Medicine

## 2024-01-22 DIAGNOSIS — K2100 Gastro-esophageal reflux disease with esophagitis, without bleeding: Secondary | ICD-10-CM | POA: Diagnosis not present

## 2024-01-22 DIAGNOSIS — M329 Systemic lupus erythematosus, unspecified: Secondary | ICD-10-CM | POA: Diagnosis not present

## 2024-01-22 DIAGNOSIS — I890 Lymphedema, not elsewhere classified: Secondary | ICD-10-CM | POA: Insufficient documentation

## 2024-01-22 DIAGNOSIS — M797 Fibromyalgia: Secondary | ICD-10-CM | POA: Diagnosis not present

## 2024-01-22 DIAGNOSIS — Z7951 Long term (current) use of inhaled steroids: Secondary | ICD-10-CM | POA: Insufficient documentation

## 2024-01-22 DIAGNOSIS — M0600 Rheumatoid arthritis without rheumatoid factor, unspecified site: Secondary | ICD-10-CM | POA: Insufficient documentation

## 2024-01-22 DIAGNOSIS — R0789 Other chest pain: Secondary | ICD-10-CM | POA: Insufficient documentation

## 2024-01-22 DIAGNOSIS — Z79899 Other long term (current) drug therapy: Secondary | ICD-10-CM | POA: Diagnosis not present

## 2024-01-22 DIAGNOSIS — K591 Functional diarrhea: Secondary | ICD-10-CM | POA: Diagnosis not present

## 2024-01-22 DIAGNOSIS — K2970 Gastritis, unspecified, without bleeding: Secondary | ICD-10-CM | POA: Insufficient documentation

## 2024-01-22 DIAGNOSIS — Z1211 Encounter for screening for malignant neoplasm of colon: Secondary | ICD-10-CM | POA: Insufficient documentation

## 2024-01-22 DIAGNOSIS — Z87891 Personal history of nicotine dependence: Secondary | ICD-10-CM | POA: Insufficient documentation

## 2024-01-22 DIAGNOSIS — R1032 Left lower quadrant pain: Secondary | ICD-10-CM | POA: Insufficient documentation

## 2024-01-22 HISTORY — PX: COLONOSCOPY: SHX5424

## 2024-01-22 HISTORY — PX: ESOPHAGOGASTRODUODENOSCOPY: SHX5428

## 2024-01-22 SURGERY — COLONOSCOPY
Anesthesia: General

## 2024-01-22 MED ORDER — HEPARIN SOD (PORK) LOCK FLUSH 100 UNIT/ML IV SOLN
INTRAVENOUS | Status: AC
Start: 2024-01-22 — End: 2024-01-22
  Filled 2024-01-22: qty 5

## 2024-01-22 MED ORDER — LIDOCAINE HCL (PF) 1 % IJ SOLN
INTRAMUSCULAR | Status: DC | PRN
Start: 2024-01-22 — End: 2024-01-22
  Administered 2024-01-22: 40 mg

## 2024-01-22 MED ORDER — PROPOFOL 10 MG/ML IV BOLUS
INTRAVENOUS | Status: DC | PRN
  Administered 2024-01-22: 30 mg via INTRAVENOUS
  Administered 2024-01-22: 20 mg via INTRAVENOUS
  Administered 2024-01-22: 40 mg via INTRAVENOUS
  Administered 2024-01-22: 50 mg via INTRAVENOUS
  Administered 2024-01-22: 20 mg via INTRAVENOUS

## 2024-01-22 MED ORDER — HEPARIN SOD (PORK) LOCK FLUSH 100 UNIT/ML IV SOLN
200.0000 [IU] | Freq: Once | INTRAVENOUS | Status: AC
  Administered 2024-01-22: 200 [IU] via INTRAVENOUS

## 2024-01-22 MED ORDER — SODIUM CHLORIDE 0.9 % IV SOLN: INTRAVENOUS | Status: DC

## 2024-01-22 NOTE — Interval H&P Note (Signed)
 History and Physical Interval Note:  01/22/2024 11:14 AM  Victoria [name]  has presented today for surgery, with the diagnosis of Diarrhea, unspecified type (R19.7) Abdominal pain, LLQ (left lower quadrant) (R10.32) Gastroesophageal reflux disease, unspecified whether esophagitis present (K21.9) Atypical chest pain (R07.89).  The various methods of treatment have been discussed with the patient and family. After consideration of risks, benefits and other options for treatment, the patient has consented to  Procedure(s) with comments: COLONOSCOPY (N/A) - Patient on Xarelto EGD (ESOPHAGOGASTRODUODENOSCOPY) (N/A) as a surgical intervention.  The patient's history has been reviewed, patient examined, no change in status, stable for surgery.  I have reviewed the patient's chart and labs.  Questions were answered to the patient's satisfaction.     Clarence, Rona Tomson

## 2024-01-22 NOTE — Op Note (Addendum)
 [facility] Patient Name: Victoria [name] Procedure Date: 01/22/2024 11:02 AM MRN: [id number] Account #: [id number] Date of Birth: 06-11-1961 Admit Type: Outpatient Age: 63 Room: [facility] ENDO ROOM 3 Gender: Female Note Status: Finalized Instrument Name: Hyman Main [id number] Procedure:             Colonoscopy Indications:           High risk colon cancer surveillance: Personal history                         of colonic polyps, Functional diarrhea Providers:             Mikell Camp K. Corky Diener MD, MD Referring MD:          Paula Busenbark K. Corky Diener MD, MD (Referring MD) Medicines:             Propofol  per Anesthesia Complications:         No immediate complications. Procedure:             Pre-Anesthesia Assessment:                        - The risks and benefits of the procedure and the                         sedation options and risks were discussed with the                         patient. All questions were answered and informed                         consent was obtained.                        - Patient identification and proposed procedure were                         verified prior to the procedure by the nurse. The                         procedure was verified in the procedure room.                        - ASA Grade Assessment: III - A patient with severe                         systemic disease.                        - After reviewing the risks and benefits, the patient                         was deemed in satisfactory condition to undergo the                         procedure.                        After obtaining informed consent, the colonoscope was  passed under direct vision. Throughout the procedure,                         the patient's blood pressure, pulse, and oxygen                         saturations were monitored continuously. The                         Colonoscope was introduced through the anus with the                          intention of advancing to the cecum. The scope was                         advanced to the sigmoid colon before the procedure was                         aborted. Medications were given. The colonoscopy was                         somewhat difficult due to inadequate bowel prep. The                         patient tolerated the procedure well. The quality of                         the bowel preparation was inadequate. Findings:      The perianal and digital rectal examinations were normal. Pertinent       negatives include normal sphincter tone and no palpable rectal lesions.      Normal mucosa was found in the rectum.      Copious quantities of semi-solid stool was found in the recto-sigmoid       colon and in the sigmoid colon, precluding visualization. Due to poor       prep , the procedure was aborted at this point and the scope was       withdrawn. Estimated blood loss: none. Impression:            - Preparation of the colon was inadequate.                        - Normal mucosa in the rectum.                        - Stool in the recto-sigmoid colon and in the sigmoid                         colon.                        - No specimens collected. Recommendation:        - Patient has a contact number available for                         emergencies. The signs and symptoms of potential                         delayed  complications were discussed with the patient.                         Return to normal activities tomorrow. Written                         discharge instructions were provided to the patient.                        - Resume previous diet.                        - Continue present medications.                        - Reschedule colonoscopy in 2-3 months with daily                         Miralax (17g) for 2 weeks prior to the procedure as                         well as Trilyte bowel prep the day before the                         colonoscopy.                         - Telephone GI office to schedule appointment at                         appointment to be scheduled.                        - The findings and recommendations were discussed with                         the patient. 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 All rights reserved. The codes documented in this report are preliminary and upon coder review may  be revised to meet current compliance requirements. Cassie Click MD, MD 01/22/2024 11:44:39 AM This report has been signed electronically. Number of Addenda: 0 Note Initiated On: 01/22/2024 11:02 AM Total Procedure Duration: 0 hours 2 minutes 39 seconds  Estimated Blood Loss:  Estimated blood loss: none.      [facility]

## 2024-01-22 NOTE — Anesthesia Preprocedure Evaluation (Addendum)
 Anesthesia Evaluation  Patient identified by MRN, date of birth, ID band Patient awake    Reviewed: Allergy & Precautions, NPO status , Patient's Chart, lab work & pertinent test results  History of Anesthesia Complications Negative for: history of anesthetic complications  Airway Mallampati: II  TM Distance: >3 FB Neck ROM: Full    Dental  (+) Teeth Intact   Pulmonary asthma , neg sleep apnea, COPD,  COPD inhaler, Patient abstained from smoking.Not current smoker, former smoker   Pulmonary exam normal breath sounds clear to auscultation       Cardiovascular Exercise Tolerance: Good METS(-) hypertension(-) CAD and (-) Past MI negative cardio ROS (-) dysrhythmias  Rhythm:Regular Rate:Normal - Systolic murmurs    Neuro/Psych  Headaches, Seizures -, Well Controlled,   negative psych ROS   GI/Hepatic ,GERD  ,,(+)     (-) substance abuse    Endo/Other  neg diabetes  Class 3 obesity  Renal/GU negative Renal ROS     Musculoskeletal  (+) Arthritis , Rheumatoid disorders,  Fibromyalgia -  Abdominal   Peds  Hematology   Anesthesia Other Findings Past Medical History: No date: Allergy No date: Asthma No date: Cancer [facility])     Comment:  Breast Left, DCIS 2018 No date: Collagen vascular disease (HCC)     Comment:  Lupus No date: COPD (chronic obstructive pulmonary disease) (HCC) No date: Diverticulitis 02/12/2021: Dysplastic nevus     Comment:  right inf lat buttock dysplastic compound nevus with               moderate atypia, limited margins free 02/12/2021: Dysplastic nevus     Comment:  right inf lat buttock at buttocks crease dysplastic               compuond nevus with moderate atypia close to margin 06/22/2021: Dysplastic nevus     Comment:  Severe, R inf med scapula, Excised 08/16/21 06/22/2021: Dysplastic nevus     Comment:  Severe, R mid back lat, Excised 08/23/2021, margins free. 06/22/2021: Dysplastic  nevus     Comment:  Mod to Severe, R low back lat, recheck at next appt 06/22/2021: Dysplastic nevus     Comment:  L med calf - severe. Re-shaved 08/30/2021, margins free. 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 No date: Neuromuscular disorder (HCC) No date: Neuropathy No date: Osteoarthritis 05/12/2019: Osteoporosis 2017: Personal history of radiation therapy     Comment:  left  No date: Pharyngoesophageal dysphagia No date: Rheumatoid arthritis (HCC)     Comment:  hands and legs No date: Seizures (HCC)     Comment:  no seizures for 10 years 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  Reproductive/Obstetrics  Anesthesia Physical Anesthesia Plan  ASA: 3  Anesthesia Plan: General   Post-op Pain Management: Minimal or no pain anticipated   Induction: Intravenous  PONV Risk Score and Plan: 3 and Propofol  infusion, TIVA and Ondansetron   Airway Management Planned: Nasal Cannula  Additional Equipment: None  Intra-op Plan:   Post-operative Plan:   Informed Consent: I have reviewed the patients History and Physical, chart, labs and discussed the procedure including the risks, benefits and alternatives for the proposed anesthesia with the patient or authorized representative who has indicated his/her understanding and acceptance.     Dental advisory given  Plan Discussed with: CRNA and  Surgeon  Anesthesia Plan Comments: (Discussed risks of anesthesia with patient, including possibility of difficulty with spontaneous ventilation under anesthesia necessitating airway intervention, PONV, and rare risks such as cardiac or respiratory or neurological events, and allergic reactions. Discussed the role of CRNA in patient's perioperative care. Patient understands. Patient informed about increased incidence of above perioperative risk due to high BMI. Patient understands.  )       Anesthesia Quick Evaluation

## 2024-01-22 NOTE — Op Note (Signed)
 [facility] Gastroenterology Patient Name: Victoria [name] Procedure Date: 01/22/2024 11:02 AM MRN: [id number] Account #: [id number] Date of Birth: 1960-10-19 Admit Type: Outpatient Age: 63 Room: [facility] ENDO ROOM 3 Gender: Female Note Status: Finalized Instrument Name: Upper Endoscope [id number] Procedure:             Upper GI endoscopy Indications:           Abdominal pain in the left lower quadrant,                         Gastro-esophageal reflux disease, Chest pain (non                         cardiac) Providers:             Devarion Mcclanahan K. Corky Diener MD, MD Referring MD:          Gaige Sebo K. Corky Diener MD, MD (Referring MD) Medicines:             Propofol  per Anesthesia Complications:         No immediate complications. Estimated blood loss:                         Minimal. Procedure:             Pre-Anesthesia Assessment:                        - The risks and benefits of the procedure and the                         sedation options and risks were discussed with the                         patient. All questions were answered and informed                         consent was obtained.                        - Patient identification and proposed procedure were                         verified prior to the procedure by the nurse. The                         procedure was verified in the procedure room.                        - ASA Grade Assessment: III - A patient with severe                         systemic disease.                        - After reviewing the risks and benefits, the patient                         was deemed in satisfactory condition to undergo the  procedure.                        After obtaining informed consent, the endoscope was                         passed under direct vision. Throughout the procedure,                         the patient's blood pressure, pulse, and oxygen                         saturations were monitored  continuously. The Endoscope                         was introduced through the mouth, and advanced to the                         third part of duodenum. The upper GI endoscopy was                         accomplished without difficulty. The patient tolerated                         the procedure well. Findings:      The esophagus was normal.      Patchy mild inflammation characterized by congestion (edema) and       erythema was found in the gastric body and in the gastric antrum.       Biopsies were taken with a cold forceps for Helicobacter pylori testing.       Estimated blood loss was minimal.      The cardia and gastric fundus were normal on retroflexion.      The examined duodenum was normal. Impression:            - Normal esophagus.                        - Gastritis. Biopsied.                        - Normal examined duodenum. Recommendation:        - Await pathology results.                        - Proceed with colonoscopy Procedure Code(s):     --- Professional ---                        [phone], Esophagogastroduodenoscopy, flexible,                         transoral; with biopsy, single or multiple Diagnosis Code(s):     --- Professional ---                        R07.89, Other chest pain                        K21.9, Gastro-esophageal reflux disease without  esophagitis                        R10.32, Left lower quadrant pain                        K29.70, Gastritis, unspecified, without bleeding CPT copyright 2022 American Medical Association. All rights reserved. The codes documented in this report are preliminary and upon coder review may  be revised to meet current compliance requirements. Cassie Click MD, MD 01/22/2024 11:36:55 AM This report has been signed electronically. Number of Addenda: 0 Note Initiated On: 01/22/2024 11:02 AM Estimated Blood Loss:  Estimated blood loss was minimal.      [facility]

## 2024-01-22 NOTE — Anesthesia Postprocedure Evaluation (Signed)
 Anesthesia Post Note  Patient: Victoria [name]  Procedure(s) Performed: COLONOSCOPY EGD (ESOPHAGOGASTRODUODENOSCOPY)  Patient location during evaluation: Endoscopy Anesthesia Type: General Level of consciousness: awake and alert Pain management: pain level controlled Vital Signs Assessment: post-procedure vital signs reviewed and stable Respiratory status: spontaneous breathing, nonlabored ventilation, respiratory function stable and patient connected to nasal cannula oxygen Cardiovascular status: blood pressure returned to baseline and stable Postop Assessment: no apparent nausea or vomiting Anesthetic complications: no   No notable events documented.   Last Vitals:  Vitals:   01/22/24 1153 01/22/24 1203  BP: (!) 102/59 (!) 97/50  Pulse: 73 73  Resp: 18 13  Temp:    SpO2: 96% 99%    Last Pain:  Vitals:   01/22/24 1203  TempSrc:   PainSc: 0-No pain                 Lattie Poli

## 2024-01-22 NOTE — Transfer of Care (Signed)
 Immediate Anesthesia Transfer of Care Note  Patient: Victoria [name]  Procedure(s) Performed: COLONOSCOPY EGD (ESOPHAGOGASTRODUODENOSCOPY)  Patient Location: PACU and Endoscopy Unit  Anesthesia Type:MAC  Level of Consciousness: drowsy  Airway & Oxygen Therapy: Patient Spontanous Breathing and Patient connected to nasal cannula oxygen  Post-op Assessment: Report given to RN and Post -op Vital signs reviewed and stable  Post vital signs: Reviewed and stable  Last Vitals:  Vitals Value Taken Time  BP 105/51   Temp    Pulse 75   Resp 15 01/22/24 11:44  SpO2 98   Vitals shown include unfiled device data.  Last Pain:  Vitals:   01/22/24 1052  TempSrc: Temporal  PainSc: 0-No pain         Complications: No notable events documented.

## 2024-01-22 NOTE — H&P (Signed)
 Outpatient short stay form Pre-procedure 01/22/2024 11:12 AM Victoria [name] K. Corky Diener, M.D.  Primary Physician: Jinx Mourning, M.D.  Reason for visit:  GERD, LLQ pain, functional diarrhea  History of present illness:  oday, she reports about 6.5 out of 10 constant dull pain that is sharp when she takes a deep breath then, radiates into the back plank flank area. This is the same pain that she has had for >10 years. It is less severe now than when she was admitted. No weight loss, change in bowel habits, or hematochezia. No urgency, frequency, dysuria or hematuria. She is taking chronic oxycodone  and gabapentin , and Plaquenil daily for fibromyalgia, seronegative rheumatoid arthritis and SLE.   Bowel movements are now all diarrhea. Yesterday she had 2 liquid stools, and 4 stools the day prior. She has stopped taking the laxatives. She is sure she doe not have underlying constipation and overflow diarrhea. She has some nausea, but not severe enough to take Zofran . No vomiting. Some heartburn/reflux occasionally into the throat with heartburn despite taking omeprazole  40 twice daily. No dysphagia. Her neck feels red hot at times. She reports not hungry and feels full for hours after eating. Stable weight 170. Uses lymphedema pump.   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 Port-A cath in place d/t lack of access. 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 She has chronic anemia and thrombocytopenia previous workup includes normal LDH, folate, B12 level, negative hepatitis panel, HIV negative. Negative SPEP [facility]. Normal immature platelet fraction indicating. COVID-19 pneumonia December 2020.   Patient held xarelto for 2-3 days prior to EGD and colonoscopy today.  Current Facility-Administered Medications:    0.9 %  sodium chloride  infusion, , Intravenous,  Continuous, Jibril Mcminn K, MD  Facility-Administered Medications Ordered in Other Encounters:    sodium chloride  flush (NS) 0.9 % injection 10 mL, 10 mL, Intravenous, PRN, Timmy Forbes, MD, 10 mL at 01/22/19 1503  Medications Prior to Admission  Medication Sig Dispense Refill Last Dose/Taking   albuterol  (VENTOLIN  HFA) 108 (90 Base) MCG/ACT inhaler Inhale into the lungs.   01/21/2024 Morning   Ascorbic Acid (VITAMIN C) 1000 MG tablet Take 1,000 mg by mouth daily.   Past Week   aspirin EC 81 MG tablet Take by mouth.   01/21/2024 Morning   atorvastatin (LIPITOR) 40 MG tablet Take 40 mg by mouth daily.   01/21/2024 Morning   azelastine (ASTELIN) 0.1 % nasal spray Place 2 sprays into both nostrils 2 (two) times daily.   01/21/2024 Morning   budesonide -formoterol  (SYMBICORT ) 160-4.5 MCG/ACT inhaler Inhale 2 puffs into the lungs 2 (two) times daily. 1 Inhaler 12 01/21/2024 Morning   cholecalciferol (VITAMIN D3) 25 MCG (1000 UNIT) tablet Take 1,000 Units by mouth daily.   Past Week   cyclobenzaprine  (FLEXERIL ) 10 MG tablet TAKE 1 TABLET BY MOUTH TWICE A DAY AS NEEDED FOR MUSCLE SPASMS 40 tablet 0 01/21/2024   fluticasone (FLONASE) 50 MCG/ACT nasal spray Place into both nostrils.   01/21/2024   gabapentin  (NEURONTIN ) 100 MG capsule Take 100 mg by mouth 3 (three) times daily.   01/21/2024   hydroxychloroquine (PLAQUENIL) 200 MG tablet Take 200 mg by mouth daily.   01/21/2024 Morning   Ipratropium-Albuterol  (COMBIVENT RESPIMAT) 20-100 MCG/ACT AERS respimat INHALE 2 PUFFS BY MOUTH INTO THE LUNGS EVERY 4 TIMES DAILY AS NEEDED FOR WHEEZING  01/21/2024 Morning   JARDIANCE 10 MG TABS tablet Take 10 mg by mouth daily.   01/21/2024 Morning   metoprolol succinate (TOPROL-XL) 25 MG 24 hr tablet Take 25 mg by mouth daily.   01/21/2024 Morning   mupirocin  ointment (BACTROBAN ) 2 % Apply 1 Application topically daily. qd to excision site 22 g 0 01/21/2024 Morning   omeprazole  (PRILOSEC) 40 MG capsule Take 1 capsule (40 mg total)  by mouth 2 (two) times daily. 60 capsule 6 01/21/2024 Morning   ondansetron  (ZOFRAN ) 4 MG tablet Take 1 tablet (4 mg total) by mouth every 8 (eight) hours as needed for up to 10 doses for nausea or vomiting. 20 tablet 0 Past Week   Tiotropium Bromide  Monohydrate (SPIRIVA  RESPIMAT) 2.5 MCG/ACT AERS Inhale 2 puffs into the lungs every morning. 1 Inhaler 11 01/21/2024 Morning   traZODone  (DESYREL ) 50 MG tablet Take 3 tablets (150 mg total) by mouth at bedtime. 180 tablet 1 Past Week   benzonatate  (TESSALON ) 200 MG capsule TAKE 1 CAPSULE BY MOUTH 3 TIMES A DAY ASNEEDED FOR COUGH FOR UP TO SEVEN DAYS      gabapentin  (NEURONTIN ) 300 MG capsule Take 1-2 capsules (300-600 mg total) by mouth 3 (three) times daily. 270 capsule 0    methotrexate 2.5 MG tablet Take 2.5 mg by mouth once a week. (Patient not taking: Reported on 01/22/2024)   Not Taking   topiramate (TOPAMAX) 50 MG tablet Take 50 mg by mouth 2 (two) times daily. (Patient not taking: Reported on 01/22/2024)   Not Taking   XARELTO 20 MG TABS tablet Take 20 mg by mouth daily.   01/19/2024 at  9:00 AM     Allergies  Allergen Reactions   Other     Other reaction(s): Seizure   Sodium Hypochlorite Other (See Comments)    Seizure   Tape Hives    Other reaction(s): Skin irritation after long exposure per pt Other reaction(s): Skin irritation after long exposure per pt Other reaction(s): Skin irritation after long exposure per pt   Codeine    Flagyl [Metronidazole]    Levofloxacin     Other reaction(s): Vomiting, vomiting from oral levaquin only   Sulfa Antibiotics      Past Medical History:  Diagnosis Date   Allergy    Asthma    Cancer (HCC)    Breast Left, DCIS 2018   Collagen vascular disease (HCC)    Lupus   COPD (chronic obstructive pulmonary disease) (HCC)    Diverticulitis    Dysplastic nevus 02/12/2021   right inf lat buttock dysplastic compound nevus with moderate atypia, limited margins free   Dysplastic nevus 02/12/2021    right inf lat buttock at buttocks crease dysplastic compuond nevus with moderate atypia close to margin   Dysplastic nevus 06/22/2021   Severe, R inf med scapula, Excised 08/16/21   Dysplastic nevus 06/22/2021   Severe, R mid back lat, Excised 08/23/2021, margins free.   Dysplastic nevus 06/22/2021   Mod to Severe, R low back lat, recheck at next appt   Dysplastic nevus 06/22/2021   L med calf - severe. Re-shaved 08/30/2021, margins free.   Dysplastic nevus 05/24/2023   right proximal ant lat thigh, severe-atypia excision done   Fibromyalgia    GERD (gastroesophageal reflux disease)    Headache    Migraines   Hearing impaired person, left    bilateral hearing aids   History of dysplastic nevus 02/12/2021   right inf buttock no residual dysplastic nevus margins free  IBS (irritable bowel syndrome)    Lupus    Mitral valve disorder    MS (multiple sclerosis) (HCC)    Muscle tension dysphonia    Neuromuscular disorder (HCC)    Neuropathy    Osteoarthritis    Osteoporosis 05/12/2019   Personal history of radiation therapy 2017   left    Pharyngoesophageal dysphagia    Rheumatoid arthritis (HCC)    hands and legs   Seizures (HCC)    no seizures for 10 years   Vocal fold atrophy     Review of systems:  Otherwise negative.    Physical Exam  Gen: Alert, oriented. Appears stated age.  HEENT: [place]/AT. PERRLA. Lungs: CTA, no wheezes. CV: RR nl S1, S2. Abd: soft, benign, no masses. BS+ Ext: No edema. Pulses 2+    Planned procedures: Proceed with EGD and colonoscopy. The patient understands the nature of the planned procedure, indications, risks, alternatives and potential complications including but not limited to bleeding, infection, perforation, damage to internal organs and possible oversedation/side effects from anesthesia. The patient agrees and gives consent to proceed.  Please refer to procedure notes for findings, recommendations and patient disposition/instructions.      Kaceton Vieau K. Corky Diener, M.D. Gastroenterology 01/22/2024  11:12 AM

## 2024-01-23 LAB — SURGICAL PATHOLOGY

## 2024-02-07 ENCOUNTER — Encounter: Payer: Self-pay | Admitting: Internal Medicine

## 2024-02-18 ENCOUNTER — Encounter: Payer: Self-pay | Admitting: Oncology

## 2024-03-17 ENCOUNTER — Encounter: Payer: Self-pay | Admitting: Internal Medicine

## 2024-03-17 ENCOUNTER — Inpatient Hospital Stay: Payer: 59 | Attending: Oncology

## 2024-03-18 ENCOUNTER — Encounter: Payer: Self-pay | Admitting: Internal Medicine

## 2024-03-18 ENCOUNTER — Ambulatory Visit
Admission: RE | Admit: 2024-03-18 | Discharge: 2024-03-18 | Disposition: A | Discharge status: Home / Self Care | Attending: Internal Medicine | Admitting: Internal Medicine

## 2024-03-18 ENCOUNTER — Other Ambulatory Visit: Payer: Self-pay

## 2024-03-18 ENCOUNTER — Ambulatory Visit: Admitting: Certified Registered"

## 2024-03-18 ENCOUNTER — Encounter
Admission: RE | Disposition: A | Discharge status: Home / Self Care | Payer: Self-pay | Source: Home / Self Care | Attending: Internal Medicine

## 2024-03-18 DIAGNOSIS — R1032 Left lower quadrant pain: Secondary | ICD-10-CM | POA: Insufficient documentation

## 2024-03-18 DIAGNOSIS — E66813 Obesity, class 3: Secondary | ICD-10-CM | POA: Insufficient documentation

## 2024-03-18 DIAGNOSIS — K219 Gastro-esophageal reflux disease without esophagitis: Secondary | ICD-10-CM | POA: Insufficient documentation

## 2024-03-18 DIAGNOSIS — Z86718 Personal history of other venous thrombosis and embolism: Secondary | ICD-10-CM | POA: Insufficient documentation

## 2024-03-18 DIAGNOSIS — M329 Systemic lupus erythematosus, unspecified: Secondary | ICD-10-CM | POA: Diagnosis not present

## 2024-03-18 DIAGNOSIS — Z87891 Personal history of nicotine dependence: Secondary | ICD-10-CM | POA: Diagnosis not present

## 2024-03-18 DIAGNOSIS — M069 Rheumatoid arthritis, unspecified: Secondary | ICD-10-CM | POA: Diagnosis not present

## 2024-03-18 DIAGNOSIS — Z860101 Personal history of adenomatous and serrated colon polyps: Secondary | ICD-10-CM | POA: Insufficient documentation

## 2024-03-18 DIAGNOSIS — G8929 Other chronic pain: Secondary | ICD-10-CM | POA: Insufficient documentation

## 2024-03-18 DIAGNOSIS — G35 Multiple sclerosis: Secondary | ICD-10-CM | POA: Insufficient documentation

## 2024-03-18 DIAGNOSIS — J4489 Other specified chronic obstructive pulmonary disease: Secondary | ICD-10-CM | POA: Diagnosis not present

## 2024-03-18 DIAGNOSIS — I509 Heart failure, unspecified: Secondary | ICD-10-CM | POA: Insufficient documentation

## 2024-03-18 DIAGNOSIS — Z6841 Body Mass Index (BMI) 40.0 and over, adult: Secondary | ICD-10-CM | POA: Insufficient documentation

## 2024-03-18 DIAGNOSIS — I471 Supraventricular tachycardia, unspecified: Secondary | ICD-10-CM | POA: Insufficient documentation

## 2024-03-18 DIAGNOSIS — Z853 Personal history of malignant neoplasm of breast: Secondary | ICD-10-CM | POA: Insufficient documentation

## 2024-03-18 DIAGNOSIS — K582 Mixed irritable bowel syndrome: Secondary | ICD-10-CM | POA: Diagnosis present

## 2024-03-18 DIAGNOSIS — R569 Unspecified convulsions: Secondary | ICD-10-CM | POA: Diagnosis not present

## 2024-03-18 HISTORY — PX: COLONOSCOPY: SHX5424

## 2024-03-18 SURGERY — COLONOSCOPY
Anesthesia: General

## 2024-03-18 MED ORDER — PROPOFOL 500 MG/50ML IV EMUL
INTRAVENOUS | Status: DC | PRN
  Administered 2024-03-18 (×2): 165 ug/kg/min via INTRAVENOUS

## 2024-03-18 MED ORDER — DEXMEDETOMIDINE HCL IN NACL 200 MCG/50ML IV SOLN
INTRAVENOUS | Status: DC | PRN
  Administered 2024-03-18 (×2): 8 ug via INTRAVENOUS

## 2024-03-18 MED ORDER — HEPARIN SOD (PORK) LOCK FLUSH 100 UNIT/ML IV SOLN
INTRAVENOUS | Status: AC
  Filled 2024-03-18: qty 5

## 2024-03-18 MED ORDER — PROPOFOL 10 MG/ML IV BOLUS
INTRAVENOUS | Status: DC | PRN
  Administered 2024-03-18: 20 mg via INTRAVENOUS
  Administered 2024-03-18: 60 mg via INTRAVENOUS
  Administered 2024-03-18 (×2): 20 mg via INTRAVENOUS
  Administered 2024-03-18: 60 mg via INTRAVENOUS
  Administered 2024-03-18: 20 mg via INTRAVENOUS

## 2024-03-18 MED ORDER — GLYCOPYRROLATE 0.2 MG/ML IJ SOLN
INTRAMUSCULAR | Status: DC | PRN
  Administered 2024-03-18 (×2): .2 mg via INTRAVENOUS

## 2024-03-18 MED ORDER — LIDOCAINE HCL (CARDIAC) PF 100 MG/5ML IV SOSY
PREFILLED_SYRINGE | INTRAVENOUS | Status: DC | PRN
  Administered 2024-03-18 (×2): 100 mg via INTRAVENOUS

## 2024-03-18 MED ORDER — SODIUM CHLORIDE 0.9 % IV SOLN: INTRAVENOUS | Status: DC

## 2024-03-18 MED ORDER — PROPOFOL 1000 MG/100ML IV EMUL
INTRAVENOUS | Status: AC
  Filled 2024-03-18: qty 400

## 2024-03-18 MED ORDER — HEPARIN SOD (PORK) LOCK FLUSH 100 UNIT/ML IV SOLN: 500.0000 [IU] | Freq: Once | INTRAVENOUS | Status: DC

## 2024-03-18 NOTE — Op Note (Signed)
 [facility]ology Patient Name: Victoria [name] Procedure Date: 03/18/2024 7:13 AM MRN: [id number] Account #: [id number] Date of Birth: 01-25-1961 Admit Type: Outpatient Age: 63 Room: [facility] ENDO ROOM 1 Gender: Female Note Status: Supervisor Override Instrument Name: Colon Scope [id number] Procedure:             Colonoscopy Indications:           High risk colon cancer surveillance: Personal history                         of non-advanced adenoma, Abdominal pain in the left                         lower quadrant, Functional diarrhea, Mixed irritable                         bowel syndrome Providers:             Lititia Sen K. Aundria MD, MD Referring MD:          Kohler Pellerito K. Aundria MD, MD (Referring MD), Layman ORN.                         Lenon MD, MD (Referring MD) Medicines:             Propofol  per Anesthesia Complications:         No immediate complications. Estimated blood loss: None. Procedure:             Pre-Anesthesia Assessment:                        - The risks and benefits of the procedure and the                         sedation options and risks were discussed with the                         patient. All questions were answered and informed                         consent was obtained.                        - Patient identification and proposed procedure were                         verified prior to the procedure by the nurse. The                         procedure was verified in the procedure room.                        - ASA Grade Assessment: II - A patient with mild                         systemic disease.                        - After reviewing the risks and benefits, the patient  was deemed in satisfactory condition to undergo the                         procedure.                        After obtaining informed consent, the colonoscope was                         passed under direct vision. Throughout the  procedure,                         the patient's blood pressure, pulse, and oxygen                         saturations were monitored continuously. The                         Colonoscope was introduced through the anus with the                         intention of advancing to the cecum. The scope was                         advanced to the rectum before the procedure was                         aborted. Medications were given. The colonoscopy was                         technically difficult and complex due to inadequate                         bowel prep. The patient tolerated the procedure well.                         The quality of the bowel preparation was                         unsatisfactory. The colonoscopy was aborted due to                         inadequate bowel prep. Findings:      The perianal exam findings include fecal smearing.      The digital rectal exam was normal. Pertinent negatives include normal       sphincter tone and no palpable rectal lesions.      A large amount of stool was found in the rectum, precluding       visualization. Due to *** , the procedure was aborted at this point and       the scope was withdrawn. Estimated blood loss: none. Impression:            - Preparation of the colon was unsatisfactory.                        - The procedure was aborted due to inadequate bowel  prep.                        - Fecal smearing found on perianal exam.                        - Stool in the rectum.                        - No specimens collected. Recommendation:        - Patient has a contact number available for                         emergencies. The signs and symptoms of potential                         delayed complications were discussed with the patient.                         Return to normal activities tomorrow. Written                         discharge instructions were provided to the patient.                        -  Resume previous diet.                        - Continue present medications.                        - Reschedule colonoscopy in 2-3 months with daily                         Miralax (17g) for 2 weeks prior to the procedure as                         well as Trilyte bowel prep the day before the                         colonoscopy.                        - Return to my office PRN.                        - The findings and recommendations were discussed with                         the patient. Procedure Code(s):     --- Professional ---                        H9894, 53, Colorectal cancer screening; colonoscopy on                         individual at high risk Diagnosis Code(s):     --- Professional ---                        Z86.010, Personal history of colonic polyps CPT copyright 2022 American Medical Association.  All rights reserved. The codes documented in this report are preliminary and upon coder review may  be revised to meet current compliance requirements. Ladell MARLA Boss MD, MD 03/18/2024 8:44:44 AM This report has been signed electronically. Number of Addenda: 0 Note Initiated On: 03/18/2024 7:13 AM Total Procedure Duration: 0 hours 1 minute 43 seconds  Estimated Blood Loss:  Estimated blood loss: none.      [facility]

## 2024-03-18 NOTE — Anesthesia Postprocedure Evaluation (Signed)
 Anesthesia Post Note  Patient: Victoria [name]  Procedure(s) Performed: COLONOSCOPY  Patient location during evaluation: PACU Anesthesia Type: General Level of consciousness: awake and alert, oriented and patient cooperative Pain management: pain level controlled Vital Signs Assessment: post-procedure vital signs reviewed and stable Respiratory status: spontaneous breathing, nonlabored ventilation and respiratory function stable Cardiovascular status: blood pressure returned to baseline and stable Postop Assessment: adequate PO intake Anesthetic complications: no   No notable events documented.   Last Vitals:  Vitals:   03/18/24 0900 03/18/24 0910  BP: 96/74 (!) 109/57  Pulse: 86 82  Resp: 18 12  Temp:    SpO2: 99% 99%    Last Pain:  Vitals:   03/18/24 0910  TempSrc:   PainSc: 0-No pain                 Alfonso Ruths

## 2024-03-18 NOTE — Anesthesia Procedure Notes (Signed)
 Procedure Name: General with mask airway Date/Time: 03/18/2024 8:37 AM  Performed by: Ledora Duncan, CRNAPre-anesthesia Checklist: Patient identified, Emergency Drugs available, Suction available and Patient being monitored Patient Re-evaluated:Patient Re-evaluated prior to induction Oxygen Delivery Method: Simple face mask Induction Type: IV induction Placement Confirmation: positive ETCO2 and breath sounds checked- equal and bilateral Dental Injury: Teeth and Oropharynx as per pre-operative assessment

## 2024-03-18 NOTE — Anesthesia Preprocedure Evaluation (Addendum)
 Anesthesia Evaluation  Patient identified by MRN, date of birth, ID band Patient awake    Reviewed: Allergy & Precautions, NPO status , Patient's Chart, lab work & pertinent test results  History of Anesthesia Complications Negative for: history of anesthetic complications  Airway Mallampati: IV   Neck ROM: Full    Dental  (+) Missing   Pulmonary asthma , COPD, former smoker (quit 2000)   Pulmonary exam normal breath sounds clear to auscultation       Cardiovascular +CHF  Normal cardiovascular exam+ dysrhythmias Supra Ventricular Tachycardia  Rhythm:Regular Rate:Normal  Hx DVT  Echo 09/13/23:    1. The left ventricle is normal in size with normal wall thickness.    2. The left ventricular systolic function is normal, LVEF is visually estimated at > 55%.    3. The right ventricle is relatively small in size, with normal systolic function.     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   Patient presents today for pre-operative risk assessment prior to knee surgery. She denies any chest pain or dyspnea (currently has some with cold). Given her radiation and LSE, she is at risk for coronary disease. However, I personally reviewed a CT chest from 05/11/2021 as well as CT from 06/2023 and did not  note any significant coronary calcifications so obstructive CAD. She also had a dobutamine stress ECHO in 2023 with no evidence of major ischemia.   She does have some level of HFpEF and currently using lasix every day. She is also on Jardiance. We have deferred spironolactone due to polypharmacy. Have recommended getting a repeat ECHO and will also recheck BMP given daily use of lasix.   Plan: 1. Lasix 40mg  every day 2. Continue Jardiance  3. Continue metoprolol succinate to 37.5mg  daily for SVT 4. Continue atorvastatin 40mg  daily for primary prevention  5. ECG, ECHO and BMP prior to surgery otherwise no further ischemic evaluation needed.   Follow up in 6 months.    Reproductive/Obstetrics                              Anesthesia Physical Anesthesia Plan  ASA: 3  Anesthesia Plan: General   Post-op Pain Management:    Induction: Intravenous  PONV Risk Score and Plan: 3 and Propofol  infusion, TIVA and Treatment may vary due to age or medical condition  Airway Management Planned: Natural Airway  Additional Equipment:   Intra-op Plan:   Post-operative Plan:   Informed Consent: I have reviewed the patients History and Physical, chart, labs and discussed the procedure including the risks, benefits and alternatives for the proposed anesthesia with the patient or authorized representative who has indicated his/her understanding and acceptance.       Plan Discussed with: CRNA  Anesthesia Plan Comments: (LMA/GETA backup discussed.  Patient consented for  risks of anesthesia including but not limited to:  - adverse reactions to medications - damage to eyes, teeth, lips or other oral mucosa - nerve damage due to positioning  - sore throat or hoarseness - damage to heart, brain, nerves, lungs, other parts of body or loss of life  Informed patient about role of CRNA in peri- and intra-operative care.  Patient voiced understanding.)          Anesthesia Quick Evaluation

## 2024-03-18 NOTE — Interval H&P Note (Signed)
 History and Physical Interval Note:  03/18/2024 8:33 AM  Victoria [name]  has presented today for surgery, with the diagnosis of Diarrhea, functional [K59.1] Irritable bowel syndrome with mixed bowel habits [K58.2] Personal history of adenomatous and serrated colon polyps [Z86.0101] Elevated fecal calprotectin [R19.5] Chronic LLQ pain [R10.32, G89.29].  The various methods of treatment have been discussed with the patient and family. After consideration of risks, benefits and other options for treatment, the patient has consented to  Procedure(s): COLONOSCOPY (N/A) as a surgical intervention.  The patient's history has been reviewed, patient examined, no change in status, stable for surgery.  I have reviewed the patient's chart and labs.  Questions were answered to the patient's satisfaction.     [place], [place]

## 2024-03-18 NOTE — H&P (Signed)
 Outpatient short stay form Pre-procedure 03/18/2024 8:31 AM Victoria [name] K. Aundria, M.D.  Primary Physician: Lorane Curry, M.D.  Reason for visit:  IBS, with mixed bowel habits, personal history of adenomatous and serrated colon polyps, chronic LLQ pain.  History of present illness:  Victoria [name] presents to the [facility] GI clinic for follow-up of functional diarrhea, IBS with mixed bowel habits, chronic LLQ abdominal pain, and GERD without esophagitis. She had EGD and colonoscopy performed by Dr. Aundria last week on 6/17. Colonoscopy was aborted due to incomplete bowel prep. EGD commented on normal esophagus, H pylori negative gastritis, and normal duodenum. She reports overall her GI symptoms are not going well. She is continuing to have bowel issues. Since attempted colonoscopy, she has not had a good BM. She reports she has been having more issues with constipation since her office visit in March. Diarrhea hasn't been as prominent. She denies any hematochezia, melena, or steatorrhea. She did have elevated fecal calprotectin on stool studies after her visit with Luke in March 3 months ago. She has previously had negative SIBO testing and normal GES. 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 Imaging has commented on possible osteochondroma. She continues to have chronic LLQ abdominal pain. Pain can occur every couple days. No clear triggers. Sometimes worse just before a BM and other times feel like a BM doesn't change the pain at all. She doesn't feel like her IBS symptoms are well-controlled at all. She reports she did drink all of the bowel prep and followed all of her instructions.     Current Facility-Administered Medications:    0.9 %  sodium chloride  infusion, , Intravenous, Continuous, Victoria [name] K, MD   heparin  lock flush 100 unit/mL, 500 Units, Intracatheter, Once, Mazzoni, Andrea, MD  Facility-Administered Medications Ordered in Other Encounters:    sodium chloride  flush (NS) 0.9  % injection 10 mL, 10 mL, Intravenous, PRN, Babara Call, MD, 10 mL at 01/22/19 1503  Medications Prior to Admission  Medication Sig Dispense Refill Last Dose/Taking   albuterol  (VENTOLIN  HFA) 108 (90 Base) MCG/ACT inhaler Inhale into the lungs.   Past Week   aspirin EC 81 MG tablet Take by mouth.   Past Week   atorvastatin (LIPITOR) 40 MG tablet Take 40 mg by mouth daily.   Past Week   azelastine (ASTELIN) 0.1 % nasal spray Place 2 sprays into both nostrils 2 (two) times daily.   Past Week   benzonatate  (TESSALON ) 200 MG capsule TAKE 1 CAPSULE BY MOUTH 3 TIMES A DAY ASNEEDED FOR COUGH FOR UP TO SEVEN DAYS   Past Week   budesonide -formoterol  (SYMBICORT ) 160-4.5 MCG/ACT inhaler Inhale 2 puffs into the lungs 2 (two) times daily. 1 Inhaler 12 Past Week   cholecalciferol (VITAMIN D3) 25 MCG (1000 UNIT) tablet Take 1,000 Units by mouth daily.   Past Week   cyclobenzaprine  (FLEXERIL ) 10 MG tablet TAKE 1 TABLET BY MOUTH TWICE A DAY AS NEEDED FOR MUSCLE SPASMS 40 tablet 0 Past Week   gabapentin  (NEURONTIN ) 100 MG capsule Take 100 mg by mouth 3 (three) times daily.   Past Week   hydroxychloroquine (PLAQUENIL) 200 MG tablet Take 200 mg by mouth daily.   Past Week   JARDIANCE 10 MG TABS tablet Take 10 mg by mouth daily.   Past Week   metoprolol succinate (TOPROL-XL) 25 MG 24 hr tablet Take 25 mg by mouth daily.   Past Week   omeprazole  (PRILOSEC) 40 MG capsule Take 1 capsule (40 mg total)  by mouth 2 (two) times daily. 60 capsule 6 Past Week   traZODone  (DESYREL ) 50 MG tablet Take 3 tablets (150 mg total) by mouth at bedtime. 180 tablet 1 Past Week   XARELTO 20 MG TABS tablet Take 20 mg by mouth daily.   Past Week   Ascorbic Acid (VITAMIN C) 1000 MG tablet Take 1,000 mg by mouth daily.      fluticasone (FLONASE) 50 MCG/ACT nasal spray Place into both nostrils.      gabapentin  (NEURONTIN ) 300 MG capsule Take 1-2 capsules (300-600 mg total) by mouth 3 (three) times daily. 270 capsule 0    Ipratropium-Albuterol   (COMBIVENT RESPIMAT) 20-100 MCG/ACT AERS respimat INHALE 2 PUFFS BY MOUTH INTO THE LUNGS EVERY 4 TIMES DAILY AS NEEDED FOR WHEEZING      methotrexate 2.5 MG tablet Take 2.5 mg by mouth once a week. (Patient not taking: Reported on 01/22/2024)      mupirocin  ointment (BACTROBAN ) 2 % Apply 1 Application topically daily. qd to excision site 22 g 0    ondansetron  (ZOFRAN ) 4 MG tablet Take 1 tablet (4 mg total) by mouth every 8 (eight) hours as needed for up to 10 doses for nausea or vomiting. 20 tablet 0    Tiotropium Bromide  Monohydrate (SPIRIVA  RESPIMAT) 2.5 MCG/ACT AERS Inhale 2 puffs into the lungs every morning. 1 Inhaler 11    topiramate (TOPAMAX) 50 MG tablet Take 50 mg by mouth 2 (two) times daily. (Patient not taking: Reported on 01/22/2024)        Allergies  Allergen Reactions   Other     Other reaction(s): Seizure   Sodium Hypochlorite Other (See Comments)    Seizure   Tape Hives    Other reaction(s): Skin irritation after long exposure per pt Other reaction(s): Skin irritation after long exposure per pt Other reaction(s): Skin irritation after long exposure per pt   Codeine    Flagyl [Metronidazole]    Levofloxacin     Other reaction(s): Vomiting, vomiting from oral levaquin only   Sulfa Antibiotics      Past Medical History:  Diagnosis Date   Allergy    Asthma    Cancer (HCC)    Breast Left, DCIS 2018   Collagen vascular disease (HCC)    Lupus   COPD (chronic obstructive pulmonary disease) (HCC)    Diverticulitis    Dysplastic nevus 02/12/2021   right inf lat buttock dysplastic compound nevus with moderate atypia, limited margins free   Dysplastic nevus 02/12/2021   right inf lat buttock at buttocks crease dysplastic compuond nevus with moderate atypia close to margin   Dysplastic nevus 06/22/2021   Severe, R inf med scapula, Excised 08/16/21   Dysplastic nevus 06/22/2021   Severe, R mid back lat, Excised 08/23/2021, margins free.   Dysplastic nevus 06/22/2021    Mod to Severe, R low back lat, recheck at next appt   Dysplastic nevus 06/22/2021   L med calf - severe. Re-shaved 08/30/2021, margins free.   Dysplastic nevus 05/24/2023   right proximal ant lat thigh, severe-atypia excision done   Fibromyalgia    GERD (gastroesophageal reflux disease)    Headache    Migraines   Hearing impaired person, left    bilateral hearing aids   History of dysplastic nevus 02/12/2021   right inf buttock no residual dysplastic nevus margins free   IBS (irritable bowel syndrome)    Lupus    Mitral valve disorder    MS (multiple sclerosis) (HCC)  Muscle tension dysphonia    Neuromuscular disorder (HCC)    Neuropathy    Osteoarthritis    Osteoporosis 05/12/2019   Personal history of radiation therapy 2017   left    Pharyngoesophageal dysphagia    Rheumatoid arthritis (HCC)    hands and legs   Seizures (HCC)    no seizures for 10 years   Vocal fold atrophy     Review of systems:  Otherwise negative.    Physical Exam  Gen: Alert, oriented. Appears stated age.  HEENT: [place]/AT. PERRLA. Lungs: CTA, no wheezes. CV: RR nl S1, S2. Abd: soft, benign, no masses. BS+ Ext: No edema. Pulses 2+    Planned procedures: Proceed with colonoscopy. The patient understands the nature of the planned procedure, indications, risks, alternatives and potential complications including but not limited to bleeding, infection, perforation, damage to internal organs and possible oversedation/side effects from anesthesia. The patient agrees and gives consent to proceed.  Please refer to procedure notes for findings, recommendations and patient disposition/instructions.     Unknown Schleyer K. Aundria, M.D. Gastroenterology 03/18/2024  8:31 AM

## 2024-03-18 NOTE — Transfer of Care (Signed)
 Immediate Anesthesia Transfer of Care Note  Patient: Victoria [name]  Procedure(s) Performed: COLONOSCOPY  Patient Location: PACU and Endoscopy Unit  Anesthesia Type:General  Level of Consciousness: drowsy and patient cooperative  Airway & Oxygen Therapy: Patient Spontanous Breathing and Patient connected to face mask oxygen  Post-op Assessment: Report given to RN and Post -op Vital signs reviewed and stable  Post vital signs: Reviewed and stable  Last Vitals:  Vitals Value Taken Time  BP 92/40 03/18/24 08:48  Temp    Pulse 25 03/18/24 08:48  Resp 16 03/18/24 08:49  SpO2 73 % 03/18/24 08:48  Vitals shown include unfiled device data.  Last Pain:  Vitals:   03/18/24 0845  TempSrc:   PainSc: Asleep         Complications: No notable events documented.

## 2024-04-02 ENCOUNTER — Ambulatory Visit
Admission: RE | Admit: 2024-04-02 | Discharge: 2024-04-02 | Disposition: A | Discharge status: Home / Self Care | Attending: Internal Medicine | Admitting: Internal Medicine

## 2024-04-02 ENCOUNTER — Ambulatory Visit: Admitting: Anesthesiology

## 2024-04-02 ENCOUNTER — Encounter: Payer: Self-pay | Admitting: Internal Medicine

## 2024-04-02 ENCOUNTER — Encounter
Admission: RE | Disposition: A | Discharge status: Home / Self Care | Payer: Self-pay | Source: Home / Self Care | Attending: Internal Medicine

## 2024-04-02 DIAGNOSIS — K589 Irritable bowel syndrome without diarrhea: Secondary | ICD-10-CM | POA: Diagnosis not present

## 2024-04-02 DIAGNOSIS — T402X5A Adverse effect of other opioids, initial encounter: Secondary | ICD-10-CM | POA: Diagnosis not present

## 2024-04-02 DIAGNOSIS — K219 Gastro-esophageal reflux disease without esophagitis: Secondary | ICD-10-CM | POA: Diagnosis not present

## 2024-04-02 DIAGNOSIS — Z79899 Other long term (current) drug therapy: Secondary | ICD-10-CM | POA: Insufficient documentation

## 2024-04-02 DIAGNOSIS — K5730 Diverticulosis of large intestine without perforation or abscess without bleeding: Secondary | ICD-10-CM | POA: Diagnosis not present

## 2024-04-02 DIAGNOSIS — Z1211 Encounter for screening for malignant neoplasm of colon: Secondary | ICD-10-CM | POA: Insufficient documentation

## 2024-04-02 DIAGNOSIS — J4489 Other specified chronic obstructive pulmonary disease: Secondary | ICD-10-CM | POA: Insufficient documentation

## 2024-04-02 DIAGNOSIS — Z87891 Personal history of nicotine dependence: Secondary | ICD-10-CM | POA: Insufficient documentation

## 2024-04-02 DIAGNOSIS — K5903 Drug induced constipation: Secondary | ICD-10-CM | POA: Insufficient documentation

## 2024-04-02 HISTORY — PX: COLONOSCOPY: SHX5424

## 2024-04-02 SURGERY — COLONOSCOPY
Anesthesia: General

## 2024-04-02 MED ORDER — PROPOFOL 500 MG/50ML IV EMUL
INTRAVENOUS | Status: DC | PRN
  Administered 2024-04-02: 150 ug/kg/min via INTRAVENOUS

## 2024-04-02 MED ORDER — HEPARIN SOD (PORK) LOCK FLUSH 100 UNIT/ML IV SOLN
500.0000 [IU] | INTRAVENOUS | Status: AC | PRN
  Administered 2024-04-02: 500 [IU]

## 2024-04-02 MED ORDER — SODIUM CHLORIDE 0.9 % IV SOLN: INTRAVENOUS | Status: DC

## 2024-04-02 MED ORDER — PROPOFOL 10 MG/ML IV BOLUS
INTRAVENOUS | Status: DC | PRN
  Administered 2024-04-02: 100 mg via INTRAVENOUS

## 2024-04-02 MED ORDER — PROPOFOL 1000 MG/100ML IV EMUL
INTRAVENOUS | Status: AC
  Filled 2024-04-02: qty 100

## 2024-04-02 MED ORDER — SODIUM CHLORIDE 0.9% FLUSH: 10.0000 mL | INTRAVENOUS | Status: DC | PRN

## 2024-04-02 MED ORDER — HEPARIN SOD (PORK) LOCK FLUSH 100 UNIT/ML IV SOLN
INTRAVENOUS | Status: AC
  Filled 2024-04-02: qty 5

## 2024-04-02 NOTE — Transfer of Care (Signed)
 Immediate Anesthesia Transfer of Care Note  Patient: Victoria [name]  Procedure(s) Performed: COLONOSCOPY  Patient Location: PACU  Anesthesia Type:General  Level of Consciousness: awake and sedated  Airway & Oxygen Therapy: Patient Spontanous Breathing and Patient connected to face mask oxygen  Post-op Assessment: Report given to RN and Post -op Vital signs reviewed and stable  Post vital signs: Reviewed and stable  Last Vitals:  Vitals Value Taken Time  BP    Temp    Pulse    Resp    SpO2      Last Pain:  Vitals:   04/02/24 0938  TempSrc: Temporal  PainSc: 0-No pain         Complications: There were no known notable events for this encounter.

## 2024-04-02 NOTE — Anesthesia Preprocedure Evaluation (Signed)
 Anesthesia Evaluation  Patient identified by MRN, date of birth, ID band Patient awake    Reviewed: Allergy & Precautions, H&P , NPO status , Patient's Chart, lab work & pertinent test results, reviewed documented beta blocker date and time   Airway Mallampati: II   Neck ROM: full    Dental  (+) Poor Dentition   Pulmonary shortness of breath and with exertion, asthma , COPD, former smoker   Pulmonary exam normal        Cardiovascular Exercise Tolerance: Good negative cardio ROS Normal cardiovascular exam Rhythm:regular Rate:Normal     Neuro/Psych  Headaches, Seizures -,   Neuromuscular disease  negative psych ROS   GI/Hepatic Neg liver ROS,GERD  Medicated,,  Endo/Other  negative endocrine ROS    Renal/GU negative Renal ROS  negative genitourinary   Musculoskeletal   Abdominal   Peds  Hematology negative hematology ROS (+)   Anesthesia Other Findings Past Medical History: No date: Allergy No date: Asthma No date: Cancer [facility])     Comment:  Breast Left, DCIS 2018 No date: Collagen vascular disease (HCC)     Comment:  Lupus No date: COPD (chronic obstructive pulmonary disease) (HCC) No date: Diverticulitis 02/12/2021: Dysplastic nevus     Comment:  right inf lat buttock dysplastic compound nevus with               moderate atypia, limited margins free 02/12/2021: Dysplastic nevus     Comment:  right inf lat buttock at buttocks crease dysplastic               compuond nevus with moderate atypia close to margin 06/22/2021: Dysplastic nevus     Comment:  Severe, R inf med scapula, Excised 08/16/21 06/22/2021: Dysplastic nevus     Comment:  Severe, R mid back lat, Excised 08/23/2021, margins free. 06/22/2021: Dysplastic nevus     Comment:  Mod to Severe, R low back lat, recheck at next appt 06/22/2021: Dysplastic nevus     Comment:  L med calf - severe. Re-shaved 08/30/2021, margins free. 05/24/2023: Dysplastic  nevus     Comment:  right proximal ant lat thigh, severe-atypia excision               done No date: Fibromyalgia No date: GERD (gastroesophageal reflux disease) No date: Headache     Comment:  Migraines No date: Hearing impaired person, left     Comment:  bilateral hearing aids 02/12/2021: History of dysplastic nevus     Comment:  right inf buttock no residual dysplastic nevus margins               free No date: IBS (irritable bowel syndrome) No date: Lupus No date: Mitral valve disorder No date: MS (multiple sclerosis) (HCC) No date: Muscle tension dysphonia No date: Neuromuscular disorder (HCC) No date: Neuropathy No date: Osteoarthritis 05/12/2019: Osteoporosis 2017: Personal history of radiation therapy     Comment:  left  No date: Pharyngoesophageal dysphagia No date: Rheumatoid arthritis (HCC)     Comment:  hands and legs No date: Seizures (HCC)     Comment:  no seizures for 10 years 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 Past Surgical History: No date: ABDOMINAL HYSTERECTOMY No date: APPENDECTOMY 2017: BREAST EXCISIONAL BIOPSY; Left     Comment:  lumpectomy with radation 2004: BREAST EXCISIONAL BIOPSY; Left     Comment:  neg surgical bx  2017: BREAST LUMPECTOMY; Left     Comment:  DCIS No date: BREAST SURGERY No date: CHOLECYSTECTOMY 01/22/2024:  COLONOSCOPY; N/A     Comment:  Procedure: COLONOSCOPY;  Surgeon: Toledo, Ladell POUR, MD;              Location: [facility] END[facility]COPY;  Service: Gastroenterology;                Laterality: N/A;  Patient on Xarelto 03/18/2024: COLONOSCOPY; N/A     Comment:  Procedure: COLONOSCOPY;  Surgeon: Toledo, Ladell POUR, MD;              Location: [facility] END[facility];  Service: Gastroenterology;                Laterality: N/A; 07/07/2019: COLONOSCOPY WITH PROPOFOL ; N/A     Comment:  Procedure: COLONOSCOPY WITH PROPOFOL ;  Surgeon: Toledo,               Ladell POUR, MD;  Location: [facility];  Service:               Gastroenterology;  Laterality:  N/A; 09/22/2020: DIRECT LARYNGOSCOPY; N/A     Comment:  Procedure: SUSPENSION MICRODIRECT LARYNGOSCOPY WITH               BIOPSY;  Surgeon: Milissa Hamming, MD;  Location:               [facility];  Service: ENT;  Laterality: N/A;                has port a cath needs accessed 01/22/2024: ESOPHAGOGASTRODUODENOSCOPY; N/A     Comment:  Procedure: EGD (ESOPHAGOGASTRODUODENOSCOPY);  Surgeon:               Toledo, Ladell POUR, MD;  Location: [facility];                Service: Gastroenterology;  Laterality: N/A; 07/07/2019: ESOPHAGOGASTRODUODENOSCOPY (EGD) WITH PROPOFOL ; N/A     Comment:  Procedure: ESOPHAGOGASTRODUODENOSCOPY (EGD) WITH               PROPOFOL ;  Surgeon: Toledo, Ladell POUR, MD;  Location:               [facility];  Service: Gastroenterology;  Laterality:               N/A; No date: PATENT DUCTUS ARTERIOUS REPAIR No date: RECONSTRUCTION TENDON PULLEY HAND; Left No date: TOTAL HIP ARTHROPLASTY; Right BMI    Body Mass Index: 41.84 kg/m     Reproductive/Obstetrics negative OB ROS                              Anesthesia Physical Anesthesia Plan  ASA: 3  Anesthesia Plan: General   Post-op Pain Management:    Induction:   PONV Risk Score and Plan:   Airway Management Planned:   Additional Equipment:   Intra-op Plan:   Post-operative Plan:   Informed Consent: I have reviewed the patients History and Physical, chart, labs and discussed the procedure including the risks, benefits and alternatives for the proposed anesthesia with the patient or authorized representative who has indicated his/her understanding and acceptance.     Dental Advisory Given  Plan Discussed with: CRNA  Anesthesia Plan Comments:         Anesthesia Quick Evaluation

## 2024-04-02 NOTE — Op Note (Addendum)
 [facility] Gastroenterology Patient Name: Victoria [name] Procedure Date: 04/02/2024 10:53 AM MRN: [id number] Account #: [id number] Date of Birth: Mar 29, 1961 Admit Type: Outpatient Age: 63 Room: [facility] ENDO ROOM 2 Gender: Female Note Status: Supervisor Override Instrument Name: Colon Scope [id number] Procedure:             Colonoscopy Indications:           High risk colon cancer surveillance: Personal history                         of multiple (3 or more) adenomas, High risk colon                         cancer surveillance: Personal history of non-advanced                         adenoma, High risk colon cancer surveillance: Personal                         history of sessile serrated colon polyp (less than 10                         mm in size) with no dysplasia Providers:             Kraig Genis K. Aundria MD, MD Referring MD:          Breckan Cafiero K. Aundria MD, MD (Referring MD), No Local Md,                         MD (Referring MD) Medicines:             Propofol  per Anesthesia Complications:         No immediate complications. Estimated blood loss: None. Procedure:             Pre-Anesthesia Assessment:                        - The risks and benefits of the procedure and the                         sedation options and risks were discussed with the                         patient. All questions were answered and informed                         consent was obtained.                        - Patient identification and proposed procedure were                         verified prior to the procedure by the nurse. The                         procedure was verified in the procedure room.                        - ASA Grade Assessment: III - A patient with severe  systemic disease.                        - After reviewing the risks and benefits, the patient                         was deemed in satisfactory condition to undergo the                          procedure.                        After obtaining informed consent, the colonoscope was                         passed under direct vision. Throughout the procedure,                         the patient's blood pressure, pulse, and oxygen                         saturations were monitored continuously. The was                         introduced through the anus and advanced to the the                         cecum, identified by appendiceal orifice and ileocecal                         valve. The colonoscopy was performed without                         difficulty. The patient tolerated the procedure well.                         The quality of the bowel preparation was good. The                         ileocecal valve, appendiceal orifice, and rectum were                         photographed. Findings:      The perianal and digital rectal examinations were normal. Pertinent       negatives include normal sphincter tone and no palpable rectal lesions.      Multiple large-mouthed and medium-mouthed diverticula were found in the       sigmoid colon.      No additional abnormalities were found on retroflexion.      The exam was otherwise without abnormality. Impression:            - Diverticulosis in the sigmoid colon.                        - The examination was otherwise normal.                        - No specimens collected. Recommendation:        - Patient has a contact number available for  emergencies. The signs and symptoms of potential                         delayed complications were discussed with the patient.                         Return to normal activities tomorrow. Written                         discharge instructions were provided to the patient.                        - Resume previous diet.                        - Continue present medications.                        - Repeat colonoscopy in 10 years for screening                          purposes.                        - Follow up with Jonette Primmer, PA-C in the GI office.                         [phone]                        - Telephone GI office to schedule appointment in 3                         months.                        - The findings and recommendations were discussed with                         the patient. Procedure Code(s):     --- Professional ---                        H9894, Colorectal cancer screening; colonoscopy on                         individual at high risk Diagnosis Code(s):     --- Professional ---                        K57.30, Diverticulosis of large intestine without                         perforation or abscess without bleeding                        Z86.010, Personal history of colonic polyps CPT copyright 2022 American Medical Association. All rights reserved. The codes documented in this report are preliminary and upon coder review may  be revised to meet current compliance requirements. Ladell MARLA Boss MD, MD 04/02/2024 11:19:50 AM This report has been signed electronically. Number of Addenda: 0 Note Initiated On: 04/02/2024 10:53 AM  Scope Withdrawal Time: 0 hours 6 minutes 10 seconds  Total Procedure Duration: 0 hours 10 minutes 55 seconds  Estimated Blood Loss:  Estimated blood loss: none. Estimated blood loss: none.      [facility]

## 2024-04-02 NOTE — Anesthesia Postprocedure Evaluation (Signed)
 Anesthesia Post Note  Patient: Victoria [name]  Procedure(s) Performed: COLONOSCOPY  Patient location during evaluation: PACU Anesthesia Type: General Level of consciousness: awake and alert Pain management: pain level controlled Vital Signs Assessment: post-procedure vital signs reviewed and stable Respiratory status: spontaneous breathing, nonlabored ventilation, respiratory function stable and patient connected to nasal cannula oxygen Cardiovascular status: blood pressure returned to baseline and stable Postop Assessment: no apparent nausea or vomiting Anesthetic complications: no   There were no known notable events for this encounter.   Last Vitals:  Vitals:   04/02/24 1129 04/02/24 1139  BP: (!) 118/100 119/63  Pulse: 80 82  Resp: 16 13  Temp:    SpO2: 100% 100%    Last Pain:  Vitals:   04/02/24 1139  TempSrc:   PainSc: 0-No pain                 [place] KANDICE Clause

## 2024-04-02 NOTE — H&P (Signed)
 Outpatient short stay form Pre-procedure 04/02/2024 9:39 AM Naszir Cott K. Aundria, M.D.  Primary Physician: Lorane Curry, M.D.  Reason for visit:  Opioid-induced constipation, Z86.0101 (ICD-10-CM) - History of adenomatous and serrated colon polyps, K58.2 (ICD-10-CM) - Irritable bowel syndrome with mixed bowel habits, R19.5 (ICD-10-CM) - Elevated fecal calprotectin, R10.32, G89.29 (ICD-10-CM)   History of present illness:  Ms. Cinelli presents to the [facility] GI clinic for follow-up of functional diarrhea, IBS with mixed bowel habits, chronic LLQ abdominal pain, and GERD without esophagitis. She had EGD and colonoscopy performed by Dr. Aundria last week on 6/17. Colonoscopy was aborted due to incomplete bowel prep. EGD commented on normal esophagus, H pylori negative gastritis, and normal duodenum. She reports overall her GI symptoms are not going well. She is continuing to have bowel issues. Since attempted colonoscopy, she has not had a good BM. She reports she has been having more issues with constipation since her office visit in March. Diarrhea hasn't been as prominent. She denies any hematochezia, melena, or steatorrhea. She did have elevated fecal calprotectin on stool studies after her visit with Luke in March 3 months ago. She has previously had negative SIBO testing and normal GES. 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 Imaging has commented on possible osteochondroma. She continues to have chronic LLQ abdominal pain. Pain can occur every couple days. No clear triggers. Sometimes worse just before a BM and other times feel like a BM doesn't change the pain at all. She doesn't feel like her IBS symptoms are well-controlled at all. She reports she did drink all of the bowel prep and followed all of her instructions.  Last attempted colonoscopy on 03/18/24 was aborted due to poor prep.     Current Facility-Administered Medications:    0.9 %  sodium chloride  infusion, , Intravenous,  Continuous, Yaniris Braddock K, MD  Facility-Administered Medications Ordered in Other Encounters:    sodium chloride  flush (NS) 0.9 % injection 10 mL, 10 mL, Intravenous, PRN, Babara Call, MD, 10 mL at 01/22/19 1503  Medications Prior to Admission  Medication Sig Dispense Refill Last Dose/Taking   albuterol  (VENTOLIN  HFA) 108 (90 Base) MCG/ACT inhaler Inhale into the lungs.   04/01/2024   Ascorbic Acid (VITAMIN C) 1000 MG tablet Take 1,000 mg by mouth daily.   Past Week   aspirin EC 81 MG tablet Take by mouth.   Past Week   atorvastatin (LIPITOR) 40 MG tablet Take 40 mg by mouth daily.   04/01/2024   azelastine (ASTELIN) 0.1 % nasal spray Place 2 sprays into both nostrils 2 (two) times daily.   Past Week   budesonide -formoterol  (SYMBICORT ) 160-4.5 MCG/ACT inhaler Inhale 2 puffs into the lungs 2 (two) times daily. 1 Inhaler 12 04/01/2024   cholecalciferol (VITAMIN D3) 25 MCG (1000 UNIT) tablet Take 1,000 Units by mouth daily.   Past Week   cyclobenzaprine  (FLEXERIL ) 10 MG tablet TAKE 1 TABLET BY MOUTH TWICE A DAY AS NEEDED FOR MUSCLE SPASMS 40 tablet 0 Past Week   hydroxychloroquine (PLAQUENIL) 200 MG tablet Take 200 mg by mouth daily.   04/01/2024   Ipratropium-Albuterol  (COMBIVENT RESPIMAT) 20-100 MCG/ACT AERS respimat INHALE 2 PUFFS BY MOUTH INTO THE LUNGS EVERY 4 TIMES DAILY AS NEEDED FOR WHEEZING   04/01/2024   JARDIANCE 10 MG TABS tablet Take 10 mg by mouth daily.   04/01/2024   metoprolol succinate (TOPROL-XL) 25 MG 24 hr tablet Take 25 mg by mouth daily.   04/01/2024   mupirocin  ointment (BACTROBAN ) 2 %  Apply 1 Application topically daily. qd to excision site 22 g 0 04/01/2024   omeprazole  (PRILOSEC) 40 MG capsule Take 1 capsule (40 mg total) by mouth 2 (two) times daily. 60 capsule 6 04/01/2024   ondansetron  (ZOFRAN ) 4 MG tablet Take 1 tablet (4 mg total) by mouth every 8 (eight) hours as needed for up to 10 doses for nausea or vomiting. 20 tablet 0 Past Week   Tiotropium Bromide  Monohydrate  (SPIRIVA  RESPIMAT) 2.5 MCG/ACT AERS Inhale 2 puffs into the lungs every morning. 1 Inhaler 11 04/01/2024   traZODone  (DESYREL ) 50 MG tablet Take 3 tablets (150 mg total) by mouth at bedtime. 180 tablet 1 Past Week   benzonatate  (TESSALON ) 200 MG capsule TAKE 1 CAPSULE BY MOUTH 3 TIMES A DAY ASNEEDED FOR COUGH FOR UP TO SEVEN DAYS      fluticasone (FLONASE) 50 MCG/ACT nasal spray Place into both nostrils. (Patient not taking: Reported on 04/02/2024)   Not Taking   gabapentin  (NEURONTIN ) 100 MG capsule Take 100 mg by mouth 3 (three) times daily. (Patient not taking: Reported on 04/02/2024)   Not Taking   gabapentin  (NEURONTIN ) 300 MG capsule Take 1-2 capsules (300-600 mg total) by mouth 3 (three) times daily. 270 capsule 0    methotrexate 2.5 MG tablet Take 2.5 mg by mouth once a week. (Patient not taking: Reported on 01/22/2024)      topiramate (TOPAMAX) 50 MG tablet Take 50 mg by mouth 2 (two) times daily. (Patient not taking: Reported on 01/22/2024)      XARELTO 20 MG TABS tablet Take 20 mg by mouth daily.   03/30/2024     Allergies  Allergen Reactions   Other     Other reaction(s): Seizure   Sodium Hypochlorite Other (See Comments)    Seizure   Tape Hives    Other reaction(s): Skin irritation after long exposure per pt Other reaction(s): Skin irritation after long exposure per pt Other reaction(s): Skin irritation after long exposure per pt   Codeine    Flagyl [Metronidazole]    Levofloxacin     Other reaction(s): Vomiting, vomiting from oral levaquin only   Sulfa Antibiotics      Past Medical History:  Diagnosis Date   Allergy    Asthma    Cancer (HCC)    Breast Left, DCIS 2018   Collagen vascular disease (HCC)    Lupus   COPD (chronic obstructive pulmonary disease) (HCC)    Diverticulitis    Dysplastic nevus 02/12/2021   right inf lat buttock dysplastic compound nevus with moderate atypia, limited margins free   Dysplastic nevus 02/12/2021   right inf lat buttock at  buttocks crease dysplastic compuond nevus with moderate atypia close to margin   Dysplastic nevus 06/22/2021   Severe, R inf med scapula, Excised 08/16/21   Dysplastic nevus 06/22/2021   Severe, R mid back lat, Excised 08/23/2021, margins free.   Dysplastic nevus 06/22/2021   Mod to Severe, R low back lat, recheck at next appt   Dysplastic nevus 06/22/2021   L med calf - severe. Re-shaved 08/30/2021, margins free.   Dysplastic nevus 05/24/2023   right proximal ant lat thigh, severe-atypia excision done   Fibromyalgia    GERD (gastroesophageal reflux disease)    Headache    Migraines   Hearing impaired person, left    bilateral hearing aids   History of dysplastic nevus 02/12/2021   right inf buttock no residual dysplastic nevus margins free   IBS (irritable bowel syndrome)  Lupus    Mitral valve disorder    MS (multiple sclerosis) (HCC)    Muscle tension dysphonia    Neuromuscular disorder (HCC)    Neuropathy    Osteoarthritis    Osteoporosis 05/12/2019   Personal history of radiation therapy 2017   left    Pharyngoesophageal dysphagia    Rheumatoid arthritis (HCC)    hands and legs   Seizures (HCC)    no seizures for 10 years   Vocal fold atrophy     Review of systems:  Otherwise negative.    Physical Exam  Gen: Alert, oriented. Appears stated age.  HEENT: [place]/AT. PERRLA. Lungs: CTA, no wheezes. CV: RR nl S1, S2. Abd: soft, benign, no masses. BS+ Ext: No edema. Pulses 2+    Planned procedures: Proceed with colonoscopy. The patient understands the nature of the planned procedure, indications, risks, alternatives and potential complications including but not limited to bleeding, infection, perforation, damage to internal organs and possible oversedation/side effects from anesthesia. The patient agrees and gives consent to proceed.  Please refer to procedure notes for findings, recommendations and patient disposition/instructions.     Asia Dusenbury K. Aundria,  M.D. Gastroenterology 04/02/2024  9:39 AM

## 2024-04-02 NOTE — Interval H&P Note (Signed)
 History and Physical Interval Note:  04/02/2024 9:41 AM  Victoria [name]  has presented today for surgery, with the diagnosis of K59.03, T40.2X5A (ICD-10-CM) - Opioid-induced constipation Z86.0101 (ICD-10-CM) - History of adenomatous and serrated colon polyps K58.2 (ICD-10-CM) - Irritable bowel syndrome with mixed bowel habits R19.5 (ICD-10-CM) - Elevated fecal calprotectin R10.32, G89.29 (ICD-10-CM) - Chronic LLQ pain.  The various methods of treatment have been discussed with the patient and family. After consideration of risks, benefits and other options for treatment, the patient has consented to  Procedure(s) with comments: COLONOSCOPY (N/A) - DM  on Xarelto as a surgical intervention.  The patient's history has been reviewed, patient examined, no change in status, stable for surgery.  I have reviewed the patient's chart and labs.  Questions were answered to the patient's satisfaction.     [place], Murrell Elizondo

## 2024-04-14 ENCOUNTER — Ambulatory Visit: Payer: 59

## 2024-04-14 ENCOUNTER — Other Ambulatory Visit: Payer: 59

## 2024-04-28 ENCOUNTER — Ambulatory Visit: Admitting: Oncology

## 2024-04-28 ENCOUNTER — Other Ambulatory Visit

## 2024-04-28 ENCOUNTER — Ambulatory Visit

## 2024-05-02 ENCOUNTER — Other Ambulatory Visit: Payer: Self-pay | Admitting: *Deleted

## 2024-05-02 DIAGNOSIS — I82509 Chronic embolism and thrombosis of unspecified deep veins of unspecified lower extremity: Secondary | ICD-10-CM

## 2024-05-05 ENCOUNTER — Inpatient Hospital Stay: Attending: Oncology

## 2024-05-05 ENCOUNTER — Inpatient Hospital Stay (HOSPITAL_BASED_OUTPATIENT_CLINIC_OR_DEPARTMENT_OTHER): Admitting: Oncology

## 2024-05-05 ENCOUNTER — Inpatient Hospital Stay

## 2024-05-05 ENCOUNTER — Encounter: Payer: Self-pay | Admitting: Oncology

## 2024-05-05 VITALS — BP 128/84 | HR 96 | Temp 96.7°F | Resp 18 | Wt 188.3 lb

## 2024-05-05 VITALS — BP 102/47

## 2024-05-05 DIAGNOSIS — Z86000 Personal history of in-situ neoplasm of breast: Secondary | ICD-10-CM

## 2024-05-05 DIAGNOSIS — I82509 Chronic embolism and thrombosis of unspecified deep veins of unspecified lower extremity: Secondary | ICD-10-CM

## 2024-05-05 DIAGNOSIS — Z95828 Presence of other vascular implants and grafts: Secondary | ICD-10-CM

## 2024-05-05 DIAGNOSIS — M810 Age-related osteoporosis without current pathological fracture: Secondary | ICD-10-CM | POA: Insufficient documentation

## 2024-05-05 LAB — CBC WITH DIFFERENTIAL (CANCER CENTER ONLY)
Abs Immature Granulocytes: 0.01 K/uL (ref 0.00–0.07)
Basophils Absolute: 0 K/uL (ref 0.0–0.1)
Basophils Relative: 1 %
Eosinophils Absolute: 0 K/uL (ref 0.0–0.5)
Eosinophils Relative: 0 %
HCT: 42.2 % (ref 36.0–46.0)
Hemoglobin: 14 g/dL (ref 12.0–15.0)
Immature Granulocytes: 0 %
Lymphocytes Relative: 23 %
Lymphs Abs: 1.1 K/uL (ref 0.7–4.0)
MCH: 30 pg (ref 26.0–34.0)
MCHC: 33.2 g/dL (ref 30.0–36.0)
MCV: 90.6 fL (ref 80.0–100.0)
Monocytes Absolute: 0.3 K/uL (ref 0.1–1.0)
Monocytes Relative: 6 %
Neutro Abs: 3.4 K/uL (ref 1.7–7.7)
Neutrophils Relative %: 70 %
Platelet Count: 155 K/uL (ref 150–400)
RBC: 4.66 MIL/uL (ref 3.87–5.11)
RDW: 14.6 % (ref 11.5–15.5)
WBC Count: 4.9 K/uL (ref 4.0–10.5)
nRBC: 0 % (ref 0.0–0.2)

## 2024-05-05 LAB — CMP (CANCER CENTER ONLY)
ALT: 25 U/L (ref 0–44)
AST: 30 U/L (ref 15–41)
Albumin: 3.7 g/dL (ref 3.5–5.0)
Alkaline Phosphatase: 77 U/L (ref 38–126)
Anion gap: 7 (ref 5–15)
BUN: 18 mg/dL (ref 6–20)
CO2: 23 mmol/L (ref 22–32)
Calcium: 9.1 mg/dL (ref 8.9–10.3)
Chloride: 110 mmol/L (ref 98–111)
Creatinine: 0.78 mg/dL (ref 0.44–1.00)
GFR, Estimated: >60 mL/min (ref >60)
Glucose, Bld: 129 mg/dL — ABNORMAL HIGH (ref 70–99)
Potassium: 3.8 mmol/L (ref 3.5–5.1)
Sodium: 140 mmol/L (ref 135–145)
Total Bilirubin: 0.4 mg/dL (ref 0.0–1.2)
Total Protein: 6.5 g/dL (ref 6.5–8.1)

## 2024-05-05 MED ORDER — ZOLEDRONIC ACID 4 MG/100ML IV SOLN
4.0000 mg | Freq: Once | INTRAVENOUS | Status: AC
  Administered 2024-05-05: 4 mg via INTRAVENOUS
  Filled 2024-05-05: qty 100

## 2024-05-05 MED ORDER — SODIUM CHLORIDE 0.9 % IV SOLN
INTRAVENOUS | Status: DC
  Filled 2024-05-05: qty 250

## 2024-05-05 NOTE — Progress Notes (Signed)
 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  continue bone density every 2 years- May 2026  Port-A-Cath in place due to lack of peripheral access. Continue port flush every 8 weeks   Orders Placed This Encounter  Procedures   MM 3D SCREENING MAMMOGRAM BILATERAL BREAST    Standing Status:   Future    Expected Date:   12/09/2024    Expiration Date:   05/05/2025    Reason for Exam (SYMPTOM  OR DIAGNOSIS REQUIRED):   Breast cancer    Preferred imaging location?:   [facility]    Is the patient pregnant?:   No   DG Bone Density    Standing Status:   Future    Expected Date:   12/09/2024    Expiration Date:   05/05/2025    Reason for Exam (SYMPTOM  OR DIAGNOSIS REQUIRED):   Breast cancer    Is patient pregnant?:   No    Preferred imaging location?:   [facility]   Estradiol    Standing Status:   Future    Number of Occurrences:   1    Expected Date:   05/05/2024    Expiration Date:   08/03/2024   Follicle stimulating hormone    Standing Status:   Future    Number of Occurrences:   1    Expected Date:   05/05/2024    Expiration Date:   08/03/2024   Basic Metabolic Panel - Cancer Center Only    Standing Status:   Future    Expected Date:   11/02/2024    Expiration Date:    05/05/2025   CBC with Differential (Cancer Center Only)    Standing Status:   Future    Expected Date:   05/05/2025    Expiration Date:   08/03/2025   CMP (Cancer Center only)    Standing Status:   Future    Expected Date:   05/05/2025    Expiration Date:   08/03/2025   Follow up  6 months lab BMP + Zometa   1 year. Lab MD cbc cmp Zometa  All questions were answered. The patient knows to call the clinic with any problems, questions or concerns.  Call Babara, MD, PhD [facility] Hematology Oncology 05/05/2024   HISTORY OF PRESENTING ILLNESS:   Patient moved from [place] . History of left lower extremity blood clot in 2016  started on Eliquis for a month and developed pulmonary embolism.  Switch to Pradaxa .  She also had IVC filter.  After she moved to [facility], she was temporarily off Pradaxa .  Currently back on anticoagulation. She presented to emergency room on 06/25/2018 for evaluation of right arm pain, complaining about developing knots right upper extremity.  # DCIS:  I obtained previous oncology history from [place]  [facility] Dr.Luong Don. Medical records were reviewed by me.  Patient was diagnosed  with left breast DCIS in 2017, s/p lumpectomy and radiation.  Initially was started on Arimidex however cannot tolerate and was switched to Letrozole  since Dec 2017.   # rheumatoid arthritis/lupus follows up with Dr. Georgann.  No rashes follow-up with Dr. 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  She has also established care with Rheumatology. started on Leflunomide 20mg  daily since March 2020.  Also has been on Plaquenil 200mg    # Chronic anemia and thrombocytopenia. Previous work-up includes normal LDH, folate and vitamin B12 level, negative hepatitis panel, HIV negative.  Negative SPEP-[facility].  Normal immature platelet fraction indicating underproduction. # COVID-19 pneumonia in December  2020 # 11/27/2019 unilateral left diagnostic mammogram done on  for evaluation of self palpated mass.  Study showed no mammographic or sonographic evidence of malignancy in the lower outer quadrant of left breast.  Chronic autoimmune disorders/rheumatoid arthritis/lupus: Continue follow-up with rheumatology Dr. Tobie..  unprovoked LLE DVT of common femoral, femoral, popliteal and peroneal (1 of 2) veins on 07/17/22 . she missed a few doses of pradaxa . Treated initially with heparin  and then transitioned to rivaroxaban with loading regimen prior to discharge.  08/01/22 new acute subsegmental PE in posterior basal RLL with interval resolution of prior LLL subsegmental PE on 08/02/22, . she was treated with Lovenox 60mg  BID. She follows up with hematology at [facility], according to hematology, BLE PVLs on 12/26 did not clearly show worsening DVT from 12/11 and this was not felt to be a Xarelto failure. She was recommend to switched to Xarelto 20mg  daily.   INTERVAL HISTORY Malini Flemings is a 63 y.o. female who has above history reviewed by me today presents for follow up visit for management of history of DCIS.  Patient takes letrozole ,  Patient has Mediport, flush every 8 weeks.   She finished 5 years of treatment and has been off letrozole  treatment since Feb 2023 Patient is currently on Xarelto anticoagulation for recurrent PE.  Xarelto is managed by [facility] hematology   MEDICAL HISTORY:  Past Medical History:  Diagnosis Date   Allergy    Asthma    Cancer (HCC)    Breast Left, DCIS 2018   Collagen vascular disease    Lupus   COPD (chronic obstructive pulmonary disease) (HCC)    Diverticulitis    Dysplastic nevus 02/12/2021   right inf lat buttock dysplastic compound nevus with moderate atypia, limited margins free   Dysplastic nevus 02/12/2021   right inf lat buttock at buttocks crease dysplastic compuond nevus with moderate atypia close to margin   Dysplastic nevus 06/22/2021   Severe, R inf  med scapula, Excised 08/16/21   Dysplastic nevus 06/22/2021   Severe, R mid back lat, Excised 08/23/2021, margins free.   Dysplastic nevus 06/22/2021   Mod to Severe, R low back lat, recheck at next appt   Dysplastic nevus 06/22/2021   L med calf - severe. Re-shaved 08/30/2021, margins free.   Dysplastic nevus 05/24/2023   right proximal ant lat thigh, severe-atypia excision done   Fibromyalgia    GERD (gastroesophageal reflux disease)    Headache    Migraines   Hearing impaired person, left    bilateral hearing aids   History of dysplastic nevus 02/12/2021   right inf buttock no residual dysplastic nevus margins free   IBS (irritable bowel syndrome)    Lupus    Mitral valve disorder    MS (multiple  sclerosis)    Muscle tension dysphonia    Neuromuscular disorder (HCC)    Neuropathy    Osteoarthritis    Osteoporosis 05/12/2019   Personal history of radiation therapy 2017   left    Pharyngoesophageal dysphagia    Rheumatoid arthritis (HCC)    hands and legs   Seizures (HCC)    no seizures for 10 years   Vocal fold atrophy     SURGICAL HISTORY: Past Surgical History:  Procedure Laterality Date   ABDOMINAL HYSTERECTOMY     APPENDECTOMY     BREAST EXCISIONAL BIOPSY Left 2017   lumpectomy with radation   BREAST EXCISIONAL BIOPSY Left 2004   neg surgical bx    BREAST LUMPECTOMY Left 2017   DCIS   BREAST SURGERY     CHOLECYSTECTOMY     COLONOSCOPY N/A 01/22/2024   Procedure: COLONOSCOPY;  Surgeon: Toledo, Ladell POUR, MD;  Location: [facility];  Service: Gastroenterology;  Laterality: N/A;  Patient on Xarelto   COLONOSCOPY N/A 03/18/2024   Procedure: COLONOSCOPY;  Surgeon: Toledo, Ladell POUR, MD;  Location: [facility];  Service: Gastroenterology;  Laterality: N/A;   COLONOSCOPY N/A 04/02/2024   Procedure: COLONOSCOPY;  Surgeon: Toledo, Ladell POUR, MD;  Location: [facility];  Service: Gastroenterology;  Laterality: N/A;  DM  on Xarelto   COLONOSCOPY WITH PROPOFOL  N/A  07/07/2019   Procedure: COLONOSCOPY WITH PROPOFOL ;  Surgeon: Toledo, Ladell POUR, MD;  Location: [facility];  Service: Gastroenterology;  Laterality: N/A;   DIRECT LARYNGOSCOPY N/A 09/22/2020   Procedure: SUSPENSION MICRODIRECT LARYNGOSCOPY WITH BIOPSY;  Surgeon: Milissa Hamming, MD;  Location: [facility];  Service: ENT;  Laterality: N/A;  has port a cath needs accessed   ESOPHAGOGASTRODUODENOSCOPY N/A 01/22/2024   Procedure: EGD (ESOPHAGOGASTRODUODENOSCOPY);  Surgeon: Toledo, Ladell POUR, MD;  Location: [facility];  Service: Gastroenterology;  Laterality: N/A;   ESOPHAGOGASTRODUODENOSCOPY (EGD) WITH PROPOFOL  N/A 07/07/2019   Procedure: ESOPHAGOGASTRODUODENOSCOPY (EGD) WITH PROPOFOL ;  Surgeon: Toledo, Ladell POUR, MD;  Location: [facility];  Service: Gastroenterology;  Laterality: N/A;   PATENT DUCTUS ARTERIOUS REPAIR     RECONSTRUCTION TENDON PULLEY HAND Left    TOTAL HIP ARTHROPLASTY Right     SOCIAL HISTORY: Social History   Socioeconomic History   Marital status: Widowed    Spouse name: Not on file   Number of children: 1   Years of education: Not on file   Highest education level: Some college, no degree  Occupational History   Not on file  Tobacco Use   Smoking status: Former    Current packs/day: 0.00    Average packs/day: 2.0 packs/day for 15.0 years (30.0 ttl pk-yrs)    Types: Cigarettes    Start date: 06/26/1984    Quit date: 06/27/1999    Years since quitting: 24.8   Smokeless tobacco: Never  Vaping Use   Vaping status: Never Used  Substance and Sexual Activity   Alcohol use: Never    Comment: Rarely - approx 6 beers once a month   Drug use: Never   Sexual activity: Not Currently  Other Topics Concern   Not on file  Social History Narrative   Not on file   Social Drivers of Health   Financial Resource Strain: Low Risk  (09/04/2023)   Received from [facility]   Overall Financial Resource Strain (CARDIA)    Difficulty of Paying Living  Expenses: Not hard at all  Food Insecurity: No Food Insecurity (09/04/2023)   Received from [facility]  Hunger Vital Sign    Within the past 12 months, you worried that your food would run out before you got the money to buy more.: Never true    Within the past 12 months, the food you bought just didn't last and you didn't have money to get more.: Never true  Transportation Needs: No Transportation Needs (09/04/2023)   Received from [facility] - Transportation    Lack of Transportation (Medical): No    Lack of Transportation (Non-Medical): No  Physical Activity: Insufficiently Active (06/13/2023)   Received from [facility]   Exercise Vital Sign    On average, how many days per week do you engage in moderate to strenuous exercise (like a brisk walk)?: 7 days    On average, how many minutes do you engage in exercise at this level?: 20 min  Stress: No Stress Concern Present (06/13/2023)   Received from [facility] - Occupational Stress Questionnaire    Feeling of Stress : Not at all  Social Connections: Moderately Integrated (06/13/2023)   Received from [facility]   Social Connection and Isolation Panel    In a typical week, how many times do you talk on the phone with family, friends, or neighbors?: More than three times a week    How often do you get together with friends or relatives?: More than three times a week    How often do you attend church or religious services?: More than 4 times per year    Do you belong to any clubs or organizations such as church groups, unions, fraternal or athletic groups, or school groups?: Yes    How often do you attend meetings of the clubs or organizations you belong to?: More than 4 times per year    Are you married, widowed, divorced, separated, never married, or living with a partner?: Widowed  Intimate Partner Violence: Not At Risk (06/13/2023)   Received from [facility]    Humiliation, Afraid, Rape, and Kick questionnaire    Within the last year, have you been afraid of your partner or ex-partner?: No    Within the last year, have you been humiliated or emotionally abused in other ways by your partner or ex-partner?: No    Within the last year, have you been kicked, hit, slapped, or otherwise physically hurt by your partner or ex-partner?: No    Within the last year, have you been raped or forced to have any kind of sexual activity by your partner or ex-partner?: No    FAMILY HISTORY: Family History  Problem Relation Age of Onset   COPD Mother    Stroke Father    Arthritis Brother    Diabetes Brother    Stroke Brother    Breast cancer Neg Hx     ALLERGIES:  is allergic to other, sodium hypochlorite, tape, codeine, flagyl [metronidazole], levofloxacin, and sulfa antibiotics.  MEDICATIONS:  Current Outpatient Medications  Medication Sig Dispense Refill   albuterol  (VENTOLIN  HFA) 108 (90 Base) MCG/ACT inhaler Inhale into the lungs.     Ascorbic Acid (VITAMIN C) 1000 MG tablet Take 1,000 mg by mouth daily.     aspirin EC 81 MG tablet Take by mouth.     atorvastatin (LIPITOR) 40 MG tablet Take 40 mg by mouth daily.     azelastine (ASTELIN) 0.1 % nasal spray Place 2 sprays into both nostrils 2 (two) times daily.  benzonatate  (TESSALON ) 200 MG capsule TAKE 1 CAPSULE BY MOUTH 3 TIMES A DAY ASNEEDED FOR COUGH FOR UP TO SEVEN DAYS     budesonide -formoterol  (SYMBICORT ) 160-4.5 MCG/ACT inhaler Inhale 2 puffs into the lungs 2 (two) times daily. 1 Inhaler 12   cholecalciferol (VITAMIN D3) 25 MCG (1000 UNIT) tablet Take 1,000 Units by mouth daily.     cyclobenzaprine  (FLEXERIL ) 10 MG tablet TAKE 1 TABLET BY MOUTH TWICE A DAY AS NEEDED FOR MUSCLE SPASMS 40 tablet 0   gabapentin  (NEURONTIN ) 300 MG capsule Take 1-2 capsules (300-600 mg total) by mouth 3 (three) times daily. 270 capsule 0   hydroxychloroquine (PLAQUENIL) 200 MG tablet Take 200 mg by mouth daily.      Ipratropium-Albuterol  (COMBIVENT RESPIMAT) 20-100 MCG/ACT AERS respimat INHALE 2 PUFFS BY MOUTH INTO THE LUNGS EVERY 4 TIMES DAILY AS NEEDED FOR WHEEZING     JARDIANCE 10 MG TABS tablet Take 10 mg by mouth daily.     metoprolol succinate (TOPROL-XL) 25 MG 24 hr tablet Take 25 mg by mouth daily.     mupirocin  ointment (BACTROBAN ) 2 % Apply 1 Application topically daily. qd to excision site 22 g 0   omeprazole  (PRILOSEC) 40 MG capsule Take 1 capsule (40 mg total) by mouth 2 (two) times daily. 60 capsule 6   ondansetron  (ZOFRAN ) 4 MG tablet Take 1 tablet (4 mg total) by mouth every 8 (eight) hours as needed for up to 10 doses for nausea or vomiting. 20 tablet 0   Tiotropium Bromide  Monohydrate (SPIRIVA  RESPIMAT) 2.5 MCG/ACT AERS Inhale 2 puffs into the lungs every morning. 1 Inhaler 11   traZODone  (DESYREL ) 50 MG tablet Take 3 tablets (150 mg total) by mouth at bedtime. 180 tablet 1   XARELTO 20 MG TABS tablet Take 20 mg by mouth daily.     fluticasone (FLONASE) 50 MCG/ACT nasal spray Place into both nostrils. (Patient not taking: Reported on 05/05/2024)     gabapentin  (NEURONTIN ) 100 MG capsule Take 100 mg by mouth 3 (three) times daily. (Patient not taking: Reported on 05/05/2024)     methotrexate 2.5 MG tablet Take 2.5 mg by mouth once a week. (Patient not taking: Reported on 05/05/2024)     topiramate (TOPAMAX) 50 MG tablet Take 50 mg by mouth 2 (two) times daily. (Patient not taking: Reported on 05/05/2024)     No current facility-administered medications for this visit.   Facility-Administered Medications Ordered in Other Visits  Medication Dose Route Frequency Provider Last Rate Last Admin   sodium chloride  flush (NS) 0.9 % injection 10 mL  10 mL Intravenous PRN Babara Call, MD   10 mL at 01/22/19 1503   Review of Systems  Constitutional:  Negative for appetite change, chills, fatigue and fever.  HENT:   Negative for hearing loss and voice change.   Eyes:  Negative for eye problems.   Respiratory:  Negative for chest tightness and cough.   Cardiovascular:  Negative for chest pain.  Gastrointestinal:  Negative for abdominal distention, abdominal pain and blood in stool.  Endocrine: Negative for hot flashes.  Genitourinary:  Negative for difficulty urinating and frequency.        Genital sore  Musculoskeletal:  Negative for arthralgias.  Skin:  Negative for itching and rash.  Neurological:  Negative for extremity weakness.  Hematological:  Negative for adenopathy.  Psychiatric/Behavioral:  Negative for confusion.      PHYSICAL EXAMINATION: ECOG PERFORMANCE STATUS: 1 - Symptomatic but completely ambulatory Vitals:   05/05/24 1015  05/05/24 1022  BP: (!) 149/90 128/84  Pulse: 96   Resp: 18   Temp: (!) 96.7 F (35.9 C)   SpO2: 100%    Filed Weights   05/05/24 1015  Weight: 188 lb 4.8 oz (85.4 kg)    Physical Exam Constitutional:      General: She is not in acute distress.    Appearance: She is obese. She is not diaphoretic.  HENT:     Head: Normocephalic and atraumatic.     Nose: Nose normal.     Mouth/Throat:     Pharynx: No oropharyngeal exudate.  Eyes:     General: No scleral icterus.    Pupils: Pupils are equal, round, and reactive to light.  Cardiovascular:     Rate and Rhythm: Normal rate and regular rhythm.     Heart sounds: No murmur heard. Pulmonary:     Effort: Pulmonary effort is normal. No respiratory distress.     Breath sounds: No rales.  Chest:     Chest wall: No tenderness.  Abdominal:     General: There is no distension.     Palpations: Abdomen is soft.     Tenderness: There is no abdominal tenderness.  Musculoskeletal:        General: Normal range of motion.     Cervical back: Normal range of motion and neck supple.  Skin:    General: Skin is warm and dry.     Findings: No erythema.  Neurological:     Mental Status: She is alert and oriented to person, place, and time.     Cranial Nerves: No cranial nerve deficit.      Motor: No abnormal muscle tone.     Coordination: Coordination normal.  Psychiatric:        Mood and Affect: Affect normal.    .   LABORATORY DATA:  I have reviewed the data as listed    Latest Ref Rng & Units 05/05/2024    9:43 AM 04/16/2023    8:39 AM 04/03/2022   12:57 PM  CBC  WBC 4.0 - 10.5 K/uL 4.9  3.7  3.4   Hemoglobin 12.0 - 15.0 g/dL 85.9  87.6  87.5   Hematocrit 36.0 - 46.0 % 42.2  38.4  37.8   Platelets 150 - 400 K/uL 155  157  116       Latest Ref Rng & Units 05/05/2024    9:43 AM 11/02/2023    9:03 AM 04/16/2023    8:44 AM  CMP  Glucose 70 - 99 mg/dL 870  98  882   BUN 6 - 20 mg/dL 18  19  14    Creatinine 0.44 - 1.00 mg/dL 9.21  9.29  9.17   Sodium 135 - 145 mmol/L 140  139  138   Potassium 3.5 - 5.1 mmol/L 3.8  3.6  3.3   Chloride 98 - 111 mmol/L 110  105  112   CO2 22 - 32 mmol/L 23  25  21    Calcium 8.9 - 10.3 mg/dL 9.1  8.5  8.6   Total Protein 6.5 - 8.1 g/dL 6.5   6.6   Total Bilirubin 0.0 - 1.2 mg/dL 0.4   0.4   Alkaline Phos 38 - 126 U/L 77   55   AST 15 - 41 U/L 30   20   ALT 0 - 44 U/L 25   21     RADIOGRAPHIC STUDIES: No results found.

## 2024-05-05 NOTE — Assessment & Plan Note (Signed)
Continue Xarelto 20mg  daily, managed by [facility] hematology

## 2024-05-05 NOTE — Assessment & Plan Note (Addendum)
 history of left DICS-2017,S/p lumpectomy and RT.  Patient has completed 5+ years of letrozole  [07/2016-09/2021] Continue annual mammogram-due March 2026

## 2024-05-05 NOTE — Assessment & Plan Note (Signed)
due to lack of peripheral access. Continue port flush every 8 weeks

## 2024-05-05 NOTE — Assessment & Plan Note (Signed)
Osteoporosis,  Continue calcium and vitamin D Proceed with Zometa today May 2024  bone density showed stable osteoporosis left femur neck T- 2.9,stable.  continue bone density every 2 years- May 2026

## 2024-05-06 LAB — ESTRADIOL: Estradiol: 5.8 pg/mL (ref 0.0–54.7)

## 2024-05-06 LAB — FOLLICLE STIMULATING HORMONE: FSH: 84.3 m[IU]/mL (ref 25.8–134.8)

## 2024-05-12 ENCOUNTER — Inpatient Hospital Stay: Payer: 59

## 2024-05-29 ENCOUNTER — Ambulatory Visit: Payer: 59 | Admitting: Dermatology

## 2024-05-29 ENCOUNTER — Encounter: Payer: Self-pay | Admitting: Dermatology

## 2024-05-29 DIAGNOSIS — L578 Other skin changes due to chronic exposure to nonionizing radiation: Secondary | ICD-10-CM | POA: Diagnosis not present

## 2024-05-29 DIAGNOSIS — Z1283 Encounter for screening for malignant neoplasm of skin: Secondary | ICD-10-CM | POA: Diagnosis not present

## 2024-05-29 DIAGNOSIS — D485 Neoplasm of uncertain behavior of skin: Secondary | ICD-10-CM

## 2024-05-29 DIAGNOSIS — D225 Melanocytic nevi of trunk: Secondary | ICD-10-CM | POA: Diagnosis not present

## 2024-05-29 DIAGNOSIS — L814 Other melanin hyperpigmentation: Secondary | ICD-10-CM

## 2024-05-29 DIAGNOSIS — Z86018 Personal history of other benign neoplasm: Secondary | ICD-10-CM

## 2024-05-29 DIAGNOSIS — L821 Other seborrheic keratosis: Secondary | ICD-10-CM

## 2024-05-29 DIAGNOSIS — D229 Melanocytic nevi, unspecified: Secondary | ICD-10-CM

## 2024-05-29 NOTE — Patient Instructions (Addendum)
 Wound Care Instructions  Cleanse wound gently with soap and water once a day then pat dry with clean gauze. Apply a thin coat of Petrolatum (petroleum jelly, Vaseline) over the wound (unless you have an allergy to this). We recommend that you use a new, sterile tube of Vaseline. Do not pick or remove scabs. Do not remove the yellow or white healing tissue from the base of the wound.  Cover the wound with fresh, clean, nonstick gauze and secure with paper tape. You may use Band-Aids in place of gauze and tape if the wound is small enough, but would recommend trimming much of the tape off as there is often too much. Sometimes Band-Aids can irritate the skin.  You should call the office for your biopsy report after 1 week if you have not already been contacted.  If you experience any problems, such as abnormal amounts of bleeding, swelling, significant bruising, significant pain, or evidence of infection, please call the office immediately.  FOR ADULT SURGERY PATIENTS: If you need something for pain relief you may take 1 extra strength Tylenol  (acetaminophen ) AND 2 Ibuprofen (200mg  each) together every 4 hours as needed for pain. (do not take these if you are allergic to them or if you have a reason you should not take them.) Typically, you may only need pain medication for 1 to 3 days.     Melanoma ABCDEs  Melanoma is the most dangerous type of skin cancer, and is the leading cause of death from skin disease.  You are more likely to develop melanoma if you: Have light-colored skin, light-colored eyes, or red or blond hair Spend a lot of time in the sun Tan regularly, either outdoors or in a tanning bed Have had blistering sunburns, especially during childhood Have a close family member who has had a melanoma Have atypical moles or large birthmarks  Early detection of melanoma is key since treatment is typically straightforward and cure rates are extremely high if we catch it early.   The  first sign of melanoma is often a change in a mole or a new dark spot.  The ABCDE system is a way of remembering the signs of melanoma.  A for asymmetry:  The two halves do not match. B for border:  The edges of the growth are irregular. C for color:  A mixture of colors are present instead of an even brown color. D for diameter:  Melanomas are usually (but not always) greater than 6mm - the size of a pencil eraser. E for evolution:  The spot keeps changing in size, shape, and color.  Please check your skin once per month between visits. You can use a small mirror in front and a large mirror behind you to keep an eye on the back side or your body.   If you see any new or changing lesions before your next follow-up, please call to schedule a visit.  Please continue daily skin protection including broad spectrum sunscreen SPF 30+ to sun-exposed areas, reapplying every 2 hours as needed when you're outdoors.    Due to recent changes in healthcare laws, you may see results of your pathology and/or laboratory studies on MyChart before the doctors have had a chance to review them. We understand that in some cases there may be results that are confusing or concerning to you. Please understand that not all results are received at the same time and often the doctors may need to interpret multiple results in order to  provide you with the best plan of care or course of treatment. Therefore, we ask that you please give us  2 business days to thoroughly review all your results before contacting the office for clarification. Should we see a critical lab result, you will be contacted sooner.   If You Need Anything After Your Visit  If you have any questions or concerns for your doctor, please call our main line at [phone] and press option 4 to reach your doctor's medical assistant. If no one answers, please leave a voicemail as directed and we will return your call as soon as possible. Messages left after 4  pm will be answered the following business day.   You may also send us  a message via MyChart. We typically respond to MyChart messages within 1-2 business days.  For prescription refills, please ask your pharmacy to contact our office. Our fax number is [phone].  If you have an urgent issue when the clinic is closed that cannot wait until the next business day, you can page your doctor at the number below.    Please note that while we do our best to be available for urgent issues outside of office hours, we are not available 24/7.   If you have an urgent issue and are unable to reach us , you may choose to seek medical care at your doctor's office, retail clinic, urgent care center, or emergency room.  If you have a medical emergency, please immediately call 911 or go to the emergency department.  Pager Numbers  - Dr. Hester: [phone]  - Dr. Jackquline: [phone]  - Dr. Claudene: [phone]   - Dr. Raymund: [phone]  In the event of inclement weather, please call our main line at [phone] for an update on the status of any delays or closures.  Dermatology Medication Tips: Please keep the boxes that topical medications come in in order to help keep track of the instructions about where and how to use these. Pharmacies typically print the medication instructions only on the boxes and not directly on the medication tubes.   If your medication is too expensive, please contact our office at [phone] option 4 or send us  a message through MyChart.   We are unable to tell what your co-pay for medications will be in advance as this is different depending on your insurance coverage. However, we may be able to find a substitute medication at lower cost or fill out paperwork to get insurance to cover a needed medication.   If a prior authorization is required to get your medication covered by your insurance company, please allow us  1-2 business days to complete this  process.  Drug prices often vary depending on where the prescription is filled and some pharmacies may offer cheaper prices.  The website www.goodrx.com contains coupons for medications through different pharmacies. The prices here do not account for what the cost may be with help from insurance (it may be cheaper with your insurance), but the website can give you the price if you did not use any insurance.  - You can print the associated coupon and take it with your prescription to the pharmacy.  - You may also stop by our office during regular business hours and pick up a GoodRx coupon card.  - If you need your prescription sent electronically to a different pharmacy, notify our office through [facility] or by phone at [phone] option 4.     Si Usted Necesita Algo Despus de Su Visita  Tambin puede enviarnos un mensaje a travs de MyChart. Por lo general respondemos a los mensajes de MyChart en el transcurso de 1 a 2 das hbiles.  Para renovar recetas, por favor pida a su farmacia que se ponga en contacto con nuestra oficina. Randi lakes de fax es [place] [phone].  Si tiene un asunto urgente cuando la clnica est cerrada y que no puede esperar hasta el siguiente da hbil, puede llamar/localizar a su doctor(a) al nmero que aparece a continuacin.   Por favor, tenga en cuenta que aunque hacemos todo lo posible para estar disponibles para asuntos urgentes fuera del horario de [place], no estamos disponibles las 24 horas del da, los 7 [address]  [address] de la [place].   Si tiene un problema urgente y no puede comunicarse con nosotros, puede optar por buscar atencin mdica  en el consultorio de su doctor(a), en una clnica privada, en un centro de atencin urgente o en una sala de emergencias.  Si tiene Engineer, drilling, por favor llame inmediatamente al 911 o vaya a la sala de emergencias.  Nmeros de bper  - Dr. Hester: [phone]  - Dra. Jackquline: [phone]  - Dr.  Claudene: [phone]  - Dra. Kitts: [phone]  En caso de inclemencias del [place], por favor llame a nuestra lnea principal al [phone] para una actualizacin sobre el estado de cualquier retraso o cierre.  Consejos para la medicacin en dermatologa: Por favor, guarde las cajas en las que vienen los medicamentos de uso tpico para ayudarle a seguir las instrucciones sobre dnde y cmo usarlos. Las farmacias generalmente imprimen las instrucciones del medicamento slo en las cajas y no directamente en los tubos del [place].   Si su medicamento es muy caro, por favor, pngase en contacto con landry rieger llamando al [phone] y presione la opcin 4 o envenos un mensaje a travs de Clinical cytogeneticist.   No podemos decirle cul ser su copago por los medicamentos por adelantado ya que esto es diferente dependiendo de la cobertura de su seguro. Sin embargo, es posible que podamos encontrar un medicamento sustituto a Audiological scientist un formulario para que el seguro cubra el medicamento que se considera necesario.   Si se requiere una autorizacin previa para que su compaa de seguros malta su medicamento, por favor permtanos de 1 a 2 das hbiles para completar este proceso.  Los precios de los medicamentos varan con frecuencia dependiendo del Environmental consultant de dnde se surte la receta y alguna farmacias pueden ofrecer precios ms baratos.  El sitio web www.goodrx.com tiene cupones para medicamentos de Health and safety inspector. Los precios aqu no tienen en cuenta lo que podra costar con la ayuda del seguro (puede ser ms barato con su seguro), pero el sitio web puede darle el precio si no utiliz Tourist information centre manager.  - Puede imprimir el cupn correspondiente y llevarlo con su receta a la farmacia.  - Tambin puede pasar por nuestra oficina durante el horario de atencin regular y Education officer, museum una tarjeta de cupones de GoodRx.  - Si necesita que su receta se enve electrnicamente a una farmacia diferente,  informe a nuestra oficina a travs de MyChart de [place] o por telfono llamando al [phone] y presione la opcin 4.

## 2024-05-29 NOTE — Progress Notes (Signed)
   Follow-Up Visit   Subjective  Victoria [name] is a 63 y.o. female who presents for the following: Skin Cancer Screening and Full Body Skin Exam  The patient presents for Total-Body Skin Exam (TBSE) for skin cancer screening and mole check. The patient has spots, moles and lesions to be evaluated, some may be new or changing and the patient may have concern these could be cancer.  Hx DN  The following portions of the chart were reviewed this encounter and updated as appropriate: medications, allergies, medical history  Review of Systems:  No other skin or systemic complaints except as noted in HPI or Assessment and Plan.  Objective  Well appearing patient in no apparent distress; mood and affect are within normal limits.  A full examination was performed including scalp, head, eyes, ears, nose, lips, neck, chest, axillae, abdomen, back, buttocks, bilateral upper extremities, bilateral lower extremities, hands, feet, fingers, toes, fingernails, and toenails. All findings within normal limits unless otherwise noted below.   Relevant physical exam findings are noted in the Assessment and Plan.  right anterior lateral waistline Irregular brown macule 1.2 x 0.7 cm   Assessment & Plan   SKIN CANCER SCREENING PERFORMED TODAY.  ACTINIC DAMAGE - Chronic condition, secondary to cumulative UV/sun exposure - diffuse scaly erythematous macules with underlying dyspigmentation - Recommend daily broad spectrum sunscreen SPF 30+ to sun-exposed areas, reapply every 2 hours as needed.  - Staying in the shade or wearing long sleeves, sun glasses (UVA+UVB protection) and wide brim hats (4-inch brim around the entire circumference of the hat) are also recommended for sun protection.  - Call for new or changing lesions.  LENTIGINES, SEBORRHEIC KERATOSES, HEMANGIOMAS - Benign normal skin lesions - Benign-appearing - Call for any changes  MELANOCYTIC NEVI - Tan-brown and/or pink-flesh-colored  symmetric macules and papules - Benign appearing on exam today - Observation - Call clinic for new or changing moles - Recommend daily use of broad spectrum spf 30+ sunscreen to sun-exposed areas.  - left thigh, see photos - right groin, regular brown macule       History of Dysplastic Nevi - No evidence of recurrence today - Recommend regular full body skin exams - Recommend daily broad spectrum sunscreen SPF 30+ to sun-exposed areas, reapply every 2 hours as needed.  - Call if any new or changing lesions are noted between office visits  NEOPLASM OF UNCERTAIN BEHAVIOR OF SKIN right anterior lateral waistline Epidermal / dermal shaving  Lesion diameter (cm):  1.2 Informed consent: discussed and consent obtained   Timeout: patient name, date of birth, surgical site, and procedure verified   Procedure prep:  Patient was prepped and draped in usual sterile fashion Prep type:  Isopropyl alcohol Anesthesia: the lesion was anesthetized in a standard fashion   Anesthetic:  1% lidocaine  w/ epinephrine 1-100,000 buffered w/ 8.4% NaHCO3 Instrument used: flexible razor blade   Hemostasis achieved with: pressure, aluminum chloride and electrodesiccation   Outcome: patient tolerated procedure well   Post-procedure details: sterile dressing applied and wound care instructions given   Dressing type: bandage and petrolatum    Specimen 1 - Surgical pathology Differential Diagnosis: Nevus vs Dysplastic Nevus  Check Margins: No Return in about 1 year (around 05/29/2025) for TBSE, with Dr. MARLA, HxDN.  LILLETTE Lonell Drones, RMA, am acting as scribe for Alm Rhyme, MD .   Documentation: I have reviewed the above documentation for accuracy and completeness, and I agree with the above.  Alm Rhyme, MD

## 2024-06-04 LAB — SURGICAL PATHOLOGY

## 2024-06-05 ENCOUNTER — Encounter: Payer: Self-pay | Admitting: Dermatology

## 2024-06-05 ENCOUNTER — Ambulatory Visit: Payer: Self-pay | Admitting: Dermatology

## 2024-06-05 NOTE — Telephone Encounter (Addendum)
 Discussed results with patient. She verbalized understanding and denied further questions. Will recheck at next follow up  ----- Message from Alm Rhyme sent at 06/05/2024 10:32 AM EDT ----- FINAL DIAGNOSIS        1. Skin, right anterior lateral waistline :       DYSPLASTIC COMPOUND NEVUS WITH MODERATE ATYPIA, PERIPHERAL MARGIN INVOLVED    Moderate Dysplastic Recheck next visit ----- Message ----- From: Interface, Lab In Three Zero One Sent: 06/04/2024   6:35 PM EDT To: Alm JAYSON Rhyme, MD

## 2024-06-06 ENCOUNTER — Encounter: Payer: Self-pay | Admitting: Oncology

## 2024-06-25 ENCOUNTER — Ambulatory Visit
Admission: RE | Admit: 2024-06-25 | Discharge: 2024-06-25 | Disposition: A | Discharge status: Home / Self Care | Source: Ambulatory Visit | Attending: Pulmonary Disease | Admitting: Pulmonary Disease

## 2024-06-25 ENCOUNTER — Other Ambulatory Visit: Payer: Self-pay | Admitting: Pulmonary Disease

## 2024-06-25 DIAGNOSIS — J849 Interstitial pulmonary disease, unspecified: Secondary | ICD-10-CM | POA: Diagnosis present

## 2024-06-25 DIAGNOSIS — J9811 Atelectasis: Secondary | ICD-10-CM

## 2024-06-29 ENCOUNTER — Encounter: Payer: Self-pay | Admitting: Emergency Medicine

## 2024-06-29 ENCOUNTER — Emergency Department

## 2024-06-29 ENCOUNTER — Other Ambulatory Visit: Payer: Self-pay

## 2024-06-29 ENCOUNTER — Emergency Department
Admission: EM | Admit: 2024-06-29 | Discharge: 2024-06-30 | Disposition: A | Discharge status: Home / Self Care | Attending: Emergency Medicine | Admitting: Emergency Medicine

## 2024-06-29 DIAGNOSIS — Z87891 Personal history of nicotine dependence: Secondary | ICD-10-CM | POA: Diagnosis not present

## 2024-06-29 DIAGNOSIS — T18108A Unspecified foreign body in esophagus causing other injury, initial encounter: Secondary | ICD-10-CM | POA: Diagnosis not present

## 2024-06-29 DIAGNOSIS — Z7901 Long term (current) use of anticoagulants: Secondary | ICD-10-CM | POA: Insufficient documentation

## 2024-06-29 DIAGNOSIS — T189XXA Foreign body of alimentary tract, part unspecified, initial encounter: Secondary | ICD-10-CM | POA: Insufficient documentation

## 2024-06-29 DIAGNOSIS — W449XXA Unspecified foreign body entering into or through a natural orifice, initial encounter: Secondary | ICD-10-CM | POA: Diagnosis not present

## 2024-06-29 DIAGNOSIS — Z86718 Personal history of other venous thrombosis and embolism: Secondary | ICD-10-CM | POA: Diagnosis not present

## 2024-06-29 DIAGNOSIS — I5032 Chronic diastolic (congestive) heart failure: Secondary | ICD-10-CM | POA: Diagnosis not present

## 2024-06-29 DIAGNOSIS — M810 Age-related osteoporosis without current pathological fracture: Secondary | ICD-10-CM | POA: Insufficient documentation

## 2024-06-29 DIAGNOSIS — X58XXXA Exposure to other specified factors, initial encounter: Secondary | ICD-10-CM | POA: Insufficient documentation

## 2024-06-29 DIAGNOSIS — J449 Chronic obstructive pulmonary disease, unspecified: Secondary | ICD-10-CM | POA: Insufficient documentation

## 2024-06-29 DIAGNOSIS — Z853 Personal history of malignant neoplasm of breast: Secondary | ICD-10-CM | POA: Insufficient documentation

## 2024-06-29 DIAGNOSIS — Z7982 Long term (current) use of aspirin: Secondary | ICD-10-CM | POA: Diagnosis not present

## 2024-06-29 DIAGNOSIS — K219 Gastro-esophageal reflux disease without esophagitis: Secondary | ICD-10-CM | POA: Insufficient documentation

## 2024-06-29 DIAGNOSIS — Z79899 Other long term (current) drug therapy: Secondary | ICD-10-CM | POA: Insufficient documentation

## 2024-06-29 DIAGNOSIS — Z7951 Long term (current) use of inhaled steroids: Secondary | ICD-10-CM | POA: Insufficient documentation

## 2024-06-29 DIAGNOSIS — R0602 Shortness of breath: Secondary | ICD-10-CM | POA: Diagnosis present

## 2024-06-29 MED ORDER — BENZOCAINE 20 % MT AERO
1.0000 | INHALATION_SPRAY | Freq: Once | OROMUCOSAL | Status: AC
  Administered 2024-06-29: 1 via OROMUCOSAL
  Filled 2024-06-29: qty 5

## 2024-06-29 MED ORDER — BUTAMBEN-TETRACAINE-BENZOCAINE 2-2-14 % EX AERO
1.0000 | INHALATION_SPRAY | Freq: Once | CUTANEOUS | Status: AC
  Administered 2024-06-29: 1 via TOPICAL
  Filled 2024-06-29: qty 5

## 2024-06-29 MED ORDER — MIDAZOLAM HCL (PF) 2 MG/2ML IJ SOLN
2.0000 mg | Freq: Once | INTRAMUSCULAR | Status: AC
  Administered 2024-06-29: 2 mg via INTRAVENOUS
  Filled 2024-06-29: qty 2

## 2024-06-29 NOTE — ED Notes (Signed)
 ED Provider at bedside.

## 2024-06-29 NOTE — ED Notes (Signed)
 Patient's port accessed using sterile technique. Clear dressing in place.

## 2024-06-29 NOTE — ED Triage Notes (Signed)
 Pt arrives ambulatory to triage clinching throat, unable to talk due to sob. Pt took back to room, pt typed on her cell phone that she swallowed a pill ten minutes ago and unable to get pill down and throws up when she tries to drink water

## 2024-06-29 NOTE — ED Provider Notes (Signed)
 [facility] Provider Note    Event Date/Time   First MD Initiated Contact with Patient 06/29/24 2221     (approximate)   History   Swallowed Foreign Body and Shortness of Breath   HPI  Victoria [name] is a 63 y.o. female who presents to the ED for evaluation of Swallowed Foreign Body and Shortness of Breath   I reviewed PCP visit from 3 weeks ago.  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  Has seen Dr. Aundria as recently as September for diverticulosis, diverticulitis, IBS Also review an [facility] ENT DC summary from July where she had laryngotomy and removal of larynx lesion.  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  Patient presents to the ED coughing and choking after benzonatate  pill gets stuck in her throat.  She is barely able to phonate and she prefers to type responses on her phone to our questions.  Reports that she been coughing some and used a Tessalon  Perle but as soon as she was swallowing it had sensation of it getting stuck in her throat and she has been coughing and unable to clear it.   Physical Exam   Triage Vital Signs: ED Triage Vitals  Encounter Vitals Group     BP      Girls Systolic BP Percentile      Girls Diastolic BP Percentile      Boys Systolic BP Percentile      Boys Diastolic BP Percentile      Pulse      Resp      Temp      Temp src      SpO2      Weight      Height      Head Circumference      Peak Flow      Pain Score      Pain Loc      Pain Education      Exclude from Growth Chart     Most recent vital signs: Vitals:   06/29/24 2223 06/29/24 2300  BP: (!) 151/57 (!) 118/50  Pulse: (!) 102 89  Resp: (!) 28 (!) 21  Temp: 97.6 F (36.4 C)   SpO2: 100% 97%    General: Awake.  Clearly uncomfortable, frequently coughing and pointing at her throat CV:  Good peripheral perfusion.  Resp:  Normal effort.  Clear lungs,  no wheezing.  Good sats on room air Abd:  No distention.  MSK:  No deformity noted.  Neuro:  No focal deficits appreciated. Other:     ED Results / Procedures / Treatments   Labs (all labs ordered are listed, but only abnormal results are displayed) Labs Reviewed - No data to display  EKG   RADIOLOGY   Official radiology report(s): No results found.  PROCEDURES and INTERVENTIONS:  Fiberoptic laryngoscopy  Date/Time: 06/29/2024 11:20 PM  Performed by: Claudene Rover, MD Authorized by: Claudene Rover, MD  Consent: Verbal consent obtained Risks and benefits: risks, benefits and alternatives were discussed Consent given by: patient Local anesthesia used: yes  Anesthesia: Local anesthesia used: yes Local Anesthetic: topical anesthetic  Sedation: Patient sedated: no  Patient tolerance: patient tolerated the procedure well with no immediate complications Comments: After small dose IV Versed  and Cetacaine  spray, use bedside fiberoptic scope to visualize her cords without evidence of foreign body or obvious mass at this level.  Medications  Benzocaine  (HURRCAINE) 20 % mouth spray 1 Application (1 Application Mouth/Throat Given 06/29/24 2251)  midazolam  PF (VERSED ) injection 2 mg (2 mg Intravenous Given 06/29/24 2246)  butamben -tetracaine -benzocaine  (CETACAINE ) spray 1 spray (1 spray Topical Given 06/29/24 2250)     IMPRESSION / MDM / ASSESSMENT AND PLAN / ED COURSE  I reviewed the triage vital signs and the nursing notes.  Differential diagnosis includes, but is not limited to, aspiration of pill into airway, esophageal foreign body, pneumothorax or COPD exacerbation  {Patient presents with symptoms of an acute illness or injury that is potentially life-threatening.  Patient with remote history of esophageal dilatation as well as laryngeal mass removal presents with choking episode and concern for retained foreign body.  She is confident that she was swallowing  this medication and subsequent hard candy and does not think she aspirated it but interestingly she has associated significant dysphonia and raise the question of aspiration.  I am able to do a bedside fiberoptic endoscopy and her cords appear unremarkable.  Seems most likely esophageal so I discussed with GI who recommends x-rays and will see the patient.  Clinical Course as of 06/29/24 2325  [place] Jun 29, 2024  2233 Call pharmacy, sending HurriCaine spray [DS]  2245 Reassessed as I am waiting on pharmacy to send the appropriate anesthetic spray, able to phonate better than my initial evaluation.  Tells me that she ate a piece of candy after the Tessalon  Perles and concerned that they both got stuck [DS]  2309 I consult with Dr. Maryruth, GI.  He request a secure chat message and x-ray performed. [DS]  2318 Updated patient of this and she is agreeable [DS]    Clinical Course User Index [DS] Claudene Rover, MD     FINAL CLINICAL IMPRESSION(S) / ED DIAGNOSES   Final diagnoses:  Swallowed foreign body, initial encounter     Rx / DC Orders   ED Discharge Orders     None        Note:  This document was prepared using Dragon voice recognition software and may include unintentional dictation errors.   Claudene Rover, MD 06/29/24 [phone]

## 2024-06-30 ENCOUNTER — Encounter
Admission: EM | Disposition: A | Discharge status: Home / Self Care | Payer: Self-pay | Source: Home / Self Care | Attending: Emergency Medicine

## 2024-06-30 ENCOUNTER — Emergency Department: Admitting: Certified Registered"

## 2024-06-30 ENCOUNTER — Encounter: Payer: Self-pay | Admitting: Gastroenterology

## 2024-06-30 ENCOUNTER — Emergency Department

## 2024-06-30 ENCOUNTER — Inpatient Hospital Stay: Attending: Oncology

## 2024-06-30 DIAGNOSIS — M810 Age-related osteoporosis without current pathological fracture: Secondary | ICD-10-CM | POA: Insufficient documentation

## 2024-06-30 DIAGNOSIS — Z86000 Personal history of in-situ neoplasm of breast: Secondary | ICD-10-CM | POA: Diagnosis not present

## 2024-06-30 DIAGNOSIS — Z452 Encounter for adjustment and management of vascular access device: Secondary | ICD-10-CM | POA: Insufficient documentation

## 2024-06-30 DIAGNOSIS — Z86718 Personal history of other venous thrombosis and embolism: Secondary | ICD-10-CM | POA: Insufficient documentation

## 2024-06-30 DIAGNOSIS — T18108A Unspecified foreign body in esophagus causing other injury, initial encounter: Secondary | ICD-10-CM | POA: Diagnosis not present

## 2024-06-30 HISTORY — PX: ESOPHAGOGASTRODUODENOSCOPY: SHX5428

## 2024-06-30 SURGERY — EGD (ESOPHAGOGASTRODUODENOSCOPY)
Anesthesia: General

## 2024-06-30 MED ORDER — IPRATROPIUM-ALBUTEROL 0.5-2.5 (3) MG/3ML IN SOLN
3.0000 mL | Freq: Once | RESPIRATORY_TRACT | Status: AC
  Administered 2024-06-30: 3 mL via RESPIRATORY_TRACT

## 2024-06-30 MED ORDER — PROPOFOL 10 MG/ML IV BOLUS
INTRAVENOUS | Status: DC | PRN
  Administered 2024-06-30: 150 mg via INTRAVENOUS

## 2024-06-30 MED ORDER — PROPOFOL 500 MG/50ML IV EMUL
INTRAVENOUS | Status: DC | PRN
  Administered 2024-06-30: 125 ug/kg/min via INTRAVENOUS

## 2024-06-30 MED ORDER — FENTANYL CITRATE (PF) 100 MCG/2ML IJ SOLN
INTRAMUSCULAR | Status: DC | PRN
  Administered 2024-06-30 (×2): 50 ug via INTRAVENOUS

## 2024-06-30 MED ORDER — SODIUM CHLORIDE 0.9 % IV SOLN: INTRAVENOUS | Status: DC

## 2024-06-30 MED ORDER — LIDOCAINE HCL (CARDIAC) PF 100 MG/5ML IV SOSY
PREFILLED_SYRINGE | INTRAVENOUS | Status: DC | PRN
  Administered 2024-06-30: 50 mg via INTRAVENOUS

## 2024-06-30 MED ORDER — PROPOFOL 10 MG/ML IV BOLUS
INTRAVENOUS | Status: AC
Start: 2024-06-30 — End: 2024-06-30
  Filled 2024-06-30: qty 20

## 2024-06-30 MED ORDER — IPRATROPIUM-ALBUTEROL 0.5-2.5 (3) MG/3ML IN SOLN
RESPIRATORY_TRACT | Status: AC
Start: 2024-06-30 — End: 2024-06-30
  Filled 2024-06-30: qty 3

## 2024-06-30 MED ORDER — SUCCINYLCHOLINE CHLORIDE 200 MG/10ML IV SOSY
PREFILLED_SYRINGE | INTRAVENOUS | Status: DC | PRN
  Administered 2024-06-30: 80 mg via INTRAVENOUS

## 2024-06-30 MED ORDER — SUCCINYLCHOLINE CHLORIDE 200 MG/10ML IV SOSY
PREFILLED_SYRINGE | INTRAVENOUS | Status: AC
  Filled 2024-06-30: qty 10

## 2024-06-30 MED ORDER — LIDOCAINE HCL (PF) 2 % IJ SOLN
INTRAMUSCULAR | Status: AC
  Filled 2024-06-30: qty 5

## 2024-06-30 MED ORDER — FENTANYL CITRATE (PF) 100 MCG/2ML IJ SOLN
INTRAMUSCULAR | Status: AC
  Filled 2024-06-30: qty 2

## 2024-06-30 MED ORDER — PROPOFOL 10 MG/ML IV BOLUS
INTRAVENOUS | Status: AC
  Filled 2024-06-30: qty 20

## 2024-06-30 NOTE — Anesthesia Preprocedure Evaluation (Signed)
 Anesthesia Evaluation  Patient identified by MRN, date of birth, ID band Patient awake    Reviewed: Allergy & Precautions, H&P , NPO status , Patient's Chart, lab work & pertinent test results, reviewed documented beta blocker date and time   Airway Mallampati: II   Neck ROM: full    Dental  (+) Poor Dentition   Pulmonary shortness of breath and with exertion, asthma , COPD, former smoker   Pulmonary exam normal        Cardiovascular Exercise Tolerance: Good negative cardio ROS Normal cardiovascular exam Rhythm:regular Rate:Normal     Neuro/Psych  Headaches, Seizures -,   Neuromuscular disease  negative psych ROS   GI/Hepatic Neg liver ROS,GERD  Medicated,,  Endo/Other  negative endocrine ROS    Renal/GU      Musculoskeletal   Abdominal   Peds  Hematology negative hematology ROS (+)   Anesthesia Other Findings Past Medical History: No date: Allergy No date: Asthma No date: 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, DCIS 2018 No date: Collagen vascular disease (HCC)     Comment:  Lupus No date: COPD (chronic obstructive pulmonary disease) (HCC) No date: Diverticulitis 02/12/2021: Dysplastic nevus     Comment:  right inf lat buttock dysplastic compound nevus with               moderate atypia, limited margins free 02/12/2021: Dysplastic nevus     Comment:  right inf lat buttock at buttocks crease dysplastic               compuond nevus with moderate atypia close to margin 06/22/2021: Dysplastic nevus     Comment:  Severe, R inf med scapula, Excised 08/16/21 06/22/2021: Dysplastic nevus     Comment:  Severe, R mid back lat, Excised 08/23/2021, margins free. 06/22/2021: Dysplastic nevus     Comment:  Mod to Severe, R low back lat, recheck at next appt 06/22/2021: Dysplastic nevus     Comment:  L med calf - severe. Re-shaved 08/30/2021, margins free. 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 No date: Neuromuscular disorder (HCC) No date: Neuropathy No date: Osteoarthritis 05/12/2019: Osteoporosis 2017: Personal history of radiation therapy     Comment:  left  No date: Pharyngoesophageal dysphagia No date: Rheumatoid arthritis (HCC)     Comment:  hands and legs No date: Seizures (HCC)     Comment:  no seizures for 10 years 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 Past Surgical History: No date: ABDOMINAL HYSTERECTOMY No date: APPENDECTOMY 2017: BREAST EXCISIONAL BIOPSY; Left     Comment:  lumpectomy with radation 2004: BREAST EXCISIONAL BIOPSY; Left     Comment:  neg surgical bx  2017: BREAST LUMPECTOMY; Left     Comment:  DCIS No date: BREAST SURGERY No date: CHOLECYSTECTOMY 01/22/2024: COLONOSCOPY; N/A  Comment:  Procedure: COLONOSCOPY;  Surgeon: Toledo, Ladell POUR, MD;              Location: [facility];  Service: Gastroenterology;                Laterality: N/A;  Patient on Xarelto 03/18/2024: COLONOSCOPY; N/A     Comment:  Procedure: COLONOSCOPY;  Surgeon: Toledo, Ladell POUR, MD;              Location: [facility];  Service: Gastroenterology;                Laterality: N/A; 07/07/2019: COLONOSCOPY WITH PROPOFOL ; N/A     Comment:  Procedure: COLONOSCOPY WITH PROPOFOL ;  Surgeon: Toledo,               Ladell POUR, MD;  Location: [facility];  Service:               Gastroenterology;  Laterality: N/A; 09/22/2020: DIRECT LARYNGOSCOPY; N/A      Comment:  Procedure: SUSPENSION MICRODIRECT LARYNGOSCOPY WITH               BIOPSY;  Surgeon: Milissa Hamming, MD;  Location:               [facility];  Service: ENT;  Laterality: N/A;                has port a cath needs accessed 01/22/2024: ESOPHAGOGASTRODUODENOSCOPY; N/A     Comment:  Procedure: EGD (ESOPHAGOGASTRODUODENOSCOPY);  Surgeon:               Toledo, Ladell POUR, MD;  Location: [facility];                Service: Gastroenterology;  Laterality: N/A; 07/07/2019: ESOPHAGOGASTRODUODENOSCOPY (EGD) WITH PROPOFOL ; N/A     Comment:  Procedure: ESOPHAGOGASTRODUODENOSCOPY (EGD) WITH               PROPOFOL ;  Surgeon: Toledo, Ladell POUR, MD;  Location:               [facility];  Service: Gastroenterology;  Laterality:               N/A; No date: PATENT DUCTUS ARTERIOUS REPAIR No date: RECONSTRUCTION TENDON PULLEY HAND; Left No date: TOTAL HIP ARTHROPLASTY; Right BMI    Body Mass Index: 41.84 kg/m     Reproductive/Obstetrics negative OB ROS                              Anesthesia Physical Anesthesia Plan  ASA: 3 and emergent  Anesthesia Plan: General ETT   Post-op Pain Management:    Induction: Intravenous and Rapid sequence  PONV Risk Score and Plan: 3 and Ondansetron , Dexamethasone , Propofol  infusion and TIVA  Airway Management Planned: Oral ETT  Additional Equipment:   Intra-op Plan:   Post-operative Plan: Extubation in OR  Informed Consent: I have reviewed the patients History and Physical, chart, labs and discussed the procedure including the risks, benefits and alternatives for the proposed anesthesia with the patient or authorized representative who has indicated his/her understanding and acceptance.     Dental Advisory Given  Plan Discussed with: Anesthesiologist, CRNA and Surgeon  Anesthesia Plan Comments: (Patient consented for risks of anesthesia including but not limited to:  - adverse reactions to medications -  damage to eyes, teeth, lips or other oral mucosa - nerve damage due to positioning  - sore throat or hoarseness -  Damage to heart, brain, nerves, lungs, other parts of body or loss of life  Patient voiced understanding and assent.)        Anesthesia Quick Evaluation

## 2024-06-30 NOTE — OR Nursing (Signed)
 Patient in recovery on face mask O2 started coughing sometimes to the point of wretching. C/o she could not breath but when she calmed down was fine. Duoneb treatment given with slight improvement.

## 2024-06-30 NOTE — Op Note (Signed)
 [facility] Gastroenterology Patient Name: Victoria [name] Procedure Date: 06/30/2024 1:06 AM MRN: [id number] Account #: [id number] Date of Birth: [birth year] Admit Type: Emergency Department Age: 63 Room: [facility] ENDO ROOM 1 Gender: Female Note Status: Finalized Instrument Name: Endoscope [id number] Procedure:             Upper GI endoscopy Indications:           Foreign body in the esophagus Providers:             Ole Schick MD, MD Medicines:             Monitored Anesthesia Care Complications:         No immediate complications. Procedure:             Pre-Anesthesia Assessment:                        - Prior to the procedure, a History and Physical was                         performed, and patient medications and allergies were                         reviewed. The patient is competent. The risks and                         benefits of the procedure and the sedation options and                         risks were discussed with the patient. All questions                         were answered and informed consent was obtained.                         Patient identification and proposed procedure were                         verified by the physician, the nurse, the                         anesthesiologist, the anesthetist and the technician                         in the endoscopy suite. Mental Status Examination:                         alert and oriented. Airway Examination: normal                         oropharyngeal airway and neck mobility. Respiratory                         Examination: clear to auscultation. CV Examination:                         normal. Prophylactic Antibiotics: The patient does not                         require prophylactic antibiotics. Prior  Anticoagulants: The patient has taken no anticoagulant                         or antiplatelet agents. ASA Grade Assessment: E -                         Emergency. After  reviewing the risks and benefits, the                         patient was deemed in satisfactory condition to                         undergo the procedure. The anesthesia plan was to use                         general anesthesia. Immediately prior to                         administration of medications, the patient was                         re-assessed for adequacy to receive sedatives. The                         heart rate, respiratory rate, oxygen saturations,                         blood pressure, adequacy of pulmonary ventilation, and                         response to care were monitored throughout the                         procedure. The physical status of the patient was                         re-assessed after the procedure.                        After obtaining informed consent, the endoscope was                         passed under direct vision. Throughout the procedure,                         the patient's blood pressure, pulse, and oxygen                         saturations were monitored continuously. The Endoscope                         was introduced through the mouth, and advanced to the                         second part of duodenum. The upper GI endoscopy was                         accomplished without difficulty. The patient tolerated  the procedure well. Findings:      The examined esophagus was normal.      The entire examined stomach was normal.      The examined duodenum was normal. Impression:            - Normal esophagus.                        - Normal stomach.                        - Normal examined duodenum.                        - No specimens collected. Recommendation:        - Observe patient in recovery area, if still                         symptomatic then may need evaluation for possible                         bronch.                        - NPO until decision made about next step. Procedure Code(s):      --- Professional ---                        [phone], Esophagogastroduodenoscopy, flexible,                         transoral; diagnostic, including collection of                         specimen(s) by brushing or washing, when performed                         (separate procedure) Diagnosis Code(s):     --- Professional ---                        T18.108A, Unspecified foreign body in esophagus                         causing other injury, initial encounter CPT copyright 2022 [employer]. All rights reserved. The codes documented in this report are preliminary and upon coder review may  be revised to meet current compliance requirements. Ole Schick MD, MD 06/30/2024 2:08:15 AM Number of Addenda: 0 Note Initiated On: 06/30/2024 1:06 AM Estimated Blood Loss:  Estimated blood loss: none.      [facility]

## 2024-06-30 NOTE — ED Provider Notes (Addendum)
 X-ray without obvious foreign body. Patient however did complain of left leg swelling as well. On my exam no obvious increased swelling compared to right leg. No erythema or warmth. Patient does have history of blood clots, is on a blood thinner and has been taking her medication. US  was checked which did not show any signs of acute clot. Patient will be taken for endoscopy by GI.   Floy Roberts, MD 06/30/24 0128  EGD without any concerning findings. Patient returned to the ER. Concern for possible aspiration at this point, however upon my reexam patient without any significant shortness of breath or coughing. Does still have some hoarseness. Friend who is now at bedside says that it is not unusual for the patient to become hoarse, and that she has issues with her vocal cords at baseline. Did discuss with patient possible pulmonology consult, however patient states that she has a pulmonology appointment later today. At this time patient would like to be discharged to follow up at her pulmonology appointment later today. I think this is a reasonable plan. Discussed return precautions with the patient.   Floy Roberts, MD 06/30/24 [phone]

## 2024-06-30 NOTE — Transfer of Care (Signed)
 Immediate Anesthesia Transfer of Care Note  Patient: Victoria [name]  Procedure(s) Performed: EGD (ESOPHAGOGASTRODUODENOSCOPY)  Patient Location: Endoscopy Unit  Anesthesia Type:General  Level of Consciousness: awake, alert , and oriented  Airway & Oxygen Therapy: Patient Spontanous Breathing and Patient connected to face mask oxygen  Post-op Assessment: Report given to RN and Post -op Vital signs reviewed and stable  Post vital signs: Reviewed  Last Vitals:  Vitals Value Taken Time  BP 134/80   Temp    Pulse 97 06/30/24 02:13  Resp 18 06/30/24 02:13  SpO2 99 % 06/30/24 02:13  Vitals shown include unfiled device data.  Last Pain:  Vitals:   06/29/24 2223  TempSrc: Axillary  PainSc:          Complications: No notable events documented.

## 2024-06-30 NOTE — Care Plan (Signed)
 EGD completely normal. Patient still significantly symptomatic after procedure with cough/feeling something is still stuck. Discussed with ED physician that she'll need further evaluation (possibly pill went down airway?).  Frederic Schick MD, MPH

## 2024-06-30 NOTE — Anesthesia Procedure Notes (Signed)
 Procedure Name: Intubation Date/Time: 06/30/2024 1:58 AM  Performed by: Leontine Katz, CRNAPre-anesthesia Checklist: Patient identified, Patient being monitored, Timeout performed, Emergency Drugs available and Suction available Patient Re-evaluated:Patient Re-evaluated prior to induction Oxygen Delivery Method: Circle system utilized Preoxygenation: Pre-oxygenation with 100% oxygen Induction Type: IV induction and Rapid sequence Laryngoscope Size: McGrath and 3 Grade View: Grade I Tube type: Oral Tube size: 6.5 mm Number of attempts: 1 Airway Equipment and Method: Stylet and Video-laryngoscopy Placement Confirmation: ETT inserted through vocal cords under direct vision, positive ETCO2 and breath sounds checked- equal and bilateral Secured at: 20 cm Tube secured with: Tape Dental Injury: Teeth and Oropharynx as per pre-operative assessment

## 2024-06-30 NOTE — Consult Note (Signed)
 Consultation  Referring Provider: Foreign Body  Admit date: 06/30/2024 Consult date: 06/30/2024         Reason for Consultation: Foreign body              HPI:   Victoria [name] is a 63 y.o. lady with history of HFpEF, COPD, and lupus here with complaint of a pill stuck in her esophagus. She notes taking a tessalon  perle and feeling like it got stuck and then had a hard candy that became lodged as well. She tried drinking water which came right back up. 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 Had normal EGD in June. She takes blood thinners for recurrent DVT with last dose being this morning.   Past Medical History:  Diagnosis Date   Allergy    Asthma    Cancer (HCC)    Breast Left, DCIS 2018   Collagen vascular disease    Lupus   COPD (chronic obstructive pulmonary disease) (HCC)    Diverticulitis    Dysplastic nevus 02/12/2021   right inf lat buttock dysplastic compound nevus with moderate atypia, limited margins free   Dysplastic nevus 02/12/2021   right inf lat buttock at buttocks crease dysplastic compuond nevus with moderate atypia close to margin   Dysplastic nevus 06/22/2021   Severe, R inf med scapula, Excised 08/16/21   Dysplastic nevus 06/22/2021   Severe, R mid back lat, Excised 08/23/2021, margins free.   Dysplastic nevus 06/22/2021   Mod to Severe, R low back lat, recheck at next appt   Dysplastic nevus 06/22/2021   L med calf - severe. Re-shaved 08/30/2021, margins free.   Dysplastic nevus 05/24/2023   right proximal ant lat thigh, severe-atypia excision done   Dysplastic nevus 05/29/2024   right anterior lateral waistline - moderate atypia - recheck at follow up   Fibromyalgia    GERD (gastroesophageal reflux disease)    Headache    Migraines   Hearing impaired person, left    bilateral hearing aids   History of dysplastic nevus 02/12/2021   right inf buttock no residual dysplastic nevus margins free   IBS (irritable bowel syndrome)     Lupus    Mitral valve disorder    MS (multiple sclerosis)    Muscle tension dysphonia    Neuromuscular disorder (HCC)    Neuropathy    Osteoarthritis    Osteoporosis 05/12/2019   Personal history of radiation therapy 2017   left    Pharyngoesophageal dysphagia    Rheumatoid arthritis (HCC)    hands and legs   Seizures (HCC)    no seizures for 10 years   Vocal fold atrophy     Past Surgical History:  Procedure Laterality Date   ABDOMINAL HYSTERECTOMY     APPENDECTOMY     BREAST EXCISIONAL BIOPSY Left 2017   lumpectomy with radation   BREAST EXCISIONAL BIOPSY Left 2004   neg surgical bx    BREAST LUMPECTOMY Left 2017   DCIS   BREAST SURGERY     CHOLECYSTECTOMY     COLONOSCOPY N/A 01/22/2024   Procedure: COLONOSCOPY;  Surgeon: Toledo, Ladell POUR, MD;  Location: [facility];  Service: Gastroenterology;  Laterality: N/A;  Patient on Xarelto   COLONOSCOPY N/A 03/18/2024   Procedure: COLONOSCOPY;  Surgeon: Toledo, Ladell POUR, MD;  Location: [facility];  Service: Gastroenterology;  Laterality: N/A;   COLONOSCOPY N/A 04/02/2024   Procedure: COLONOSCOPY;  Surgeon: Toledo, Ladell POUR, MD;  Location: [facility];  Service: Gastroenterology;  Laterality: N/A;  DM  on Xarelto   COLONOSCOPY WITH PROPOFOL  N/A 07/07/2019   Procedure: COLONOSCOPY WITH PROPOFOL ;  Surgeon: Toledo, Ladell POUR, MD;  Location: [facility];  Service: Gastroenterology;  Laterality: N/A;   DIRECT LARYNGOSCOPY N/A 09/22/2020   Procedure: SUSPENSION MICRODIRECT LARYNGOSCOPY WITH BIOPSY;  Surgeon: Milissa Hamming, MD;  Location: [facility];  Service: ENT;  Laterality: N/A;  has port a cath needs accessed   ESOPHAGOGASTRODUODENOSCOPY N/A 01/22/2024   Procedure: EGD (ESOPHAGOGASTRODUODENOSCOPY);  Surgeon: Toledo, Ladell POUR, MD;  Location: [facility];  Service: Gastroenterology;  Laterality: N/A;   ESOPHAGOGASTRODUODENOSCOPY (EGD) WITH PROPOFOL  N/A 07/07/2019   Procedure: ESOPHAGOGASTRODUODENOSCOPY  (EGD) WITH PROPOFOL ;  Surgeon: Toledo, Ladell POUR, MD;  Location: [facility];  Service: Gastroenterology;  Laterality: N/A;   PATENT DUCTUS ARTERIOUS REPAIR     RECONSTRUCTION TENDON PULLEY HAND Left    TOTAL HIP ARTHROPLASTY Right     Family History  Problem Relation Age of Onset   COPD Mother    Stroke Father    Arthritis Brother    Diabetes Brother    Stroke Brother    Breast cancer Neg Hx      Social History   Tobacco Use   Smoking status: Former    Current packs/day: 0.00    Average packs/day: 2.0 packs/day for 15.0 years (30.0 ttl pk-yrs)    Types: Cigarettes    Start date: 06/26/1984    Quit date: 06/27/1999    Years since quitting: 25.0   Smokeless tobacco: Never  Vaping Use   Vaping status: Never Used  Substance Use Topics   Alcohol use: Never    Comment: Rarely - approx 6 beers once a month   Drug use: Never    Prior to Admission medications   Medication Sig Start Date End Date Taking? Authorizing Provider  albuterol  (VENTOLIN  HFA) 108 (90 Base) MCG/ACT inhaler Inhale into the lungs. 02/09/22   [provider]  Ascorbic Acid (VITAMIN C) 1000 MG tablet Take 1,000 mg by mouth daily.    [provider]  aspirin EC 81 MG tablet Take by mouth.    [provider]  atorvastatin (LIPITOR) 40 MG tablet Take 40 mg by mouth daily.    [provider]  azelastine (ASTELIN) 0.1 % nasal spray Place 2 sprays into both nostrils 2 (two) times daily. 03/03/20   [provider]  benzonatate  (TESSALON ) 200 MG capsule TAKE 1 CAPSULE BY MOUTH 3 TIMES A DAY ASNEEDED FOR COUGH FOR UP TO SEVEN DAYS 11/29/20   [provider]  budesonide -formoterol  (SYMBICORT ) 160-4.5 MCG/ACT inhaler Inhale 2 puffs into the lungs 2 (two) times daily. 05/06/18   Sung, Jade J, MD  cholecalciferol (VITAMIN D3) 25 MCG (1000 UNIT) tablet Take 1,000 Units by mouth daily.    [provider]  cyclobenzaprine  (FLEXERIL ) 10 MG tablet TAKE 1 TABLET BY  MOUTH TWICE A DAY AS NEEDED FOR MUSCLE SPASMS 09/25/18   Avram Barnie NOVAK, FNP  fluticasone (FLONASE) 50 MCG/ACT nasal spray Place into both nostrils. Patient not taking: Reported on 05/05/2024 04/03/20   [provider]  gabapentin  (NEURONTIN ) 100 MG capsule Take 100 mg by mouth 3 (three) times daily. Patient not taking: Reported on 05/05/2024 10/07/21   [provider]  gabapentin  (NEURONTIN ) 300 MG capsule Take 1-2 capsules (300-600 mg total) by mouth 3 (three) times daily. 12/22/20 05/05/24  Lateef, Bilal, MD  hydroxychloroquine (PLAQUENIL) 200 MG tablet Take 200 mg by mouth daily.    [provider]  Ipratropium-Albuterol  (COMBIVENT RESPIMAT) 20-100 MCG/ACT AERS respimat INHALE 2 PUFFS BY MOUTH INTO THE LUNGS EVERY 4 TIMES DAILY AS NEEDED FOR WHEEZING 07/20/20   [provider]  JARDIANCE 10 MG TABS tablet Take 10 mg by mouth daily. 01/26/22   [provider]  methotrexate 2.5 MG tablet Take 2.5 mg by mouth once a week. Patient not taking: Reported on 05/05/2024 07/21/19   [provider]  metoprolol succinate (TOPROL-XL) 25 MG 24 hr tablet Take 25 mg by mouth daily. 02/21/22   [provider]  mupirocin  ointment (BACTROBAN ) 2 % Apply 1 Application topically daily. qd to excision site 07/10/23   Hester Alm BROCKS, MD  omeprazole  (PRILOSEC) 40 MG capsule Take 1 capsule (40 mg total) by mouth 2 (two) times daily. 11/15/20   Jinny Carmine, MD  ondansetron  (ZOFRAN ) 4 MG tablet Take 1 tablet (4 mg total) by mouth every 8 (eight) hours as needed for up to 10 doses for nausea or vomiting. 09/22/20   Vaught, Carolee, MD  Tiotropium Bromide  Monohydrate (SPIRIVA  RESPIMAT) 2.5 MCG/ACT AERS Inhale 2 puffs into the lungs every morning. 06/17/18   Avram Barnie NOVAK, FNP  topiramate (TOPAMAX) 50 MG tablet Take 50 mg by mouth 2 (two) times daily. Patient not taking: Reported on 05/05/2024 09/16/19   [provider]  traZODone  (DESYREL ) 50 MG tablet  Take 3 tablets (150 mg total) by mouth at bedtime. 06/17/18   Avram Barnie NOVAK, FNP  XARELTO 20 MG TABS tablet Take 20 mg by mouth daily. 01/30/23   [provider]    No current facility-administered medications for this encounter.   Current Outpatient Medications  Medication Sig Dispense Refill   albuterol  (VENTOLIN  HFA) 108 (90 Base) MCG/ACT inhaler Inhale into the lungs.     Ascorbic Acid (VITAMIN C) 1000 MG tablet Take 1,000 mg by mouth daily.     aspirin EC 81 MG tablet Take by mouth.     atorvastatin (LIPITOR) 40 MG tablet Take 40 mg by mouth daily.     azelastine (ASTELIN) 0.1 % nasal spray Place 2 sprays into both nostrils 2 (two) times daily.     benzonatate  (TESSALON ) 200 MG capsule TAKE 1 CAPSULE BY MOUTH 3 TIMES A DAY ASNEEDED FOR COUGH FOR UP TO SEVEN DAYS     budesonide -formoterol  (SYMBICORT ) 160-4.5 MCG/ACT inhaler Inhale 2 puffs into the lungs 2 (two) times daily. 1 Inhaler 12   cholecalciferol (VITAMIN D3) 25 MCG (1000 UNIT) tablet Take 1,000 Units by mouth daily.     cyclobenzaprine  (FLEXERIL ) 10 MG tablet TAKE 1 TABLET BY MOUTH TWICE A DAY AS NEEDED FOR MUSCLE SPASMS 40 tablet 0   fluticasone (FLONASE) 50 MCG/ACT nasal spray Place into both nostrils. (Patient not taking: Reported on 05/05/2024)     gabapentin  (NEURONTIN ) 100 MG capsule Take 100 mg by mouth 3 (three) times daily. (Patient not taking: Reported on 05/05/2024)     gabapentin  (NEURONTIN ) 300 MG capsule Take 1-2 capsules (300-600 mg total) by mouth 3 (three) times daily. 270 capsule 0   hydroxychloroquine (PLAQUENIL) 200 MG tablet Take 200 mg by mouth daily.     Ipratropium-Albuterol  (COMBIVENT RESPIMAT) 20-100 MCG/ACT AERS respimat INHALE 2 PUFFS BY MOUTH INTO THE LUNGS EVERY 4 TIMES DAILY AS NEEDED FOR WHEEZING     JARDIANCE 10 MG TABS tablet Take 10 mg by mouth daily.     methotrexate 2.5 MG tablet Take 2.5 mg by mouth once a week. (Patient not taking: Reported on 05/05/2024)  metoprolol succinate  (TOPROL-XL) 25 MG 24 hr tablet Take 25 mg by mouth daily.     mupirocin  ointment (BACTROBAN ) 2 % Apply 1 Application topically daily. qd to excision site 22 g 0   omeprazole  (PRILOSEC) 40 MG capsule Take 1 capsule (40 mg total) by mouth 2 (two) times daily. 60 capsule 6   ondansetron  (ZOFRAN ) 4 MG tablet Take 1 tablet (4 mg total) by mouth every 8 (eight) hours as needed for up to 10 doses for nausea or vomiting. 20 tablet 0   Tiotropium Bromide  Monohydrate (SPIRIVA  RESPIMAT) 2.5 MCG/ACT AERS Inhale 2 puffs into the lungs every morning. 1 Inhaler 11   topiramate (TOPAMAX) 50 MG tablet Take 50 mg by mouth 2 (two) times daily. (Patient not taking: Reported on 05/05/2024)     traZODone  (DESYREL ) 50 MG tablet Take 3 tablets (150 mg total) by mouth at bedtime. 180 tablet 1   XARELTO 20 MG TABS tablet Take 20 mg by mouth daily.     Facility-Administered Medications Ordered in Other Encounters  Medication Dose Route Frequency Provider Last Rate Last Admin   sodium chloride  flush (NS) 0.9 % injection 10 mL  10 mL Intravenous PRN Babara Call, MD   10 mL at 01/22/19 1503    Allergies as of 06/29/2024 - Review Complete 06/29/2024  Allergen Reaction Noted   Other  01/06/2020   Sodium hypochlorite Other (See Comments) 07/16/2018   Tape Hives 08/22/2019   Codeine  05/05/2018   Flagyl [metronidazole]  06/25/2018   Levofloxacin  04/17/2019   Sulfa antibiotics  05/05/2018     Review of Systems:    All systems reviewed and negative except where noted in HPI.  Review of Systems  Constitutional:  Negative for chills and fever.  HENT:  Positive for sore throat.   Respiratory:  Negative for shortness of breath.   Cardiovascular:  Positive for leg swelling. Negative for chest pain.  Gastrointestinal:  Negative for abdominal pain.  Musculoskeletal:  Positive for joint pain.  Neurological:  Positive for speech change. Negative for focal weakness.  Psychiatric/Behavioral:  Negative for substance abuse.   All  other systems reviewed and are negative.     Physical Exam:  Vital signs in last 24 hours: Temp:  [97.6 F (36.4 C)] 97.6 F (36.4 C) (11/23 2223) Pulse Rate:  [89-102] 89 (11/23 2300) Resp:  [21-28] 21 (11/23 2300) BP: (118-151)/(50-57) 118/50 (11/23 2300) SpO2:  [97 %-100 %] 97 % (11/23 2300)   General:   In mild distress Head:  Normocephalic and atraumatic. Eyes:   No icterus.   Conjunctiva pink. Ears:  Normal auditory acuity. Mouth: Mucosa pink moist, no lesions. Neck:  Supple; no masses felt Lungs:  No respiratory distress Abdomen:   Flat, soft, nondistended, nontender Msk:  Left lower extremity swelling Neurologic:  Alert and  oriented x4 Skin:  Warm, dry, pink without significant lesions or rashes. Psych:  Alert and cooperative. Normal affect.  LAB RESULTS: No results for input(s): WBC, HGB, HCT, PLT in the last 72 hours. BMET No results for input(s): NA, K, CL, CO2, GLUCOSE, BUN, CREATININE, CALCIUM in the last 72 hours. LFT No results for input(s): PROT, ALBUMIN, AST, ALT, ALKPHOS, BILITOT, BILIDIR, IBILI in the last 72 hours. PT/INR No results for input(s): LABPROT, INR in the last 72 hours.  STUDIES: DG Neck Soft Tissue Result Date: 06/29/2024 EXAM: 1 VIEW XRAY OF THE SOFT TISSUE NECK 06/29/2024 11:43:00 PM COMPARISON: None available. CLINICAL HISTORY: evaluate for foreign body following  swallowing a tablet. FINDINGS: SOFT TISSUES: No Radiopaque foreign body is seen. Right chest wall port is noted. No retropharyngeal soft tissue swelling or gas. EPIGLOTTIS: The epiglottic and aryepiglottic folds appear within normal limits. BONES: Degenerative changes of the cervical spine are noted. No acute osseous abnormality. IMPRESSION: 1. No radiopaque foreign body identified. 2. No retropharyngeal soft tissue swelling or gas. Electronically signed by: Oneil Devonshire MD 06/29/2024 11:50 PM EST RP Workstation: [id number]        Impression / Plan:   63 y.o. lady with history of HFpEF, COPD, and lupus here with complaint of a pill stuck in her esophagus  - will plan for EGD given apparently severely symptomatic - discussed lower extremity swelling with ED who will order ultrasound - further recs after EGD  Frederic Schick MD, MPH [facility]

## 2024-06-30 NOTE — ED Notes (Signed)
Patient's port de-accessed.

## 2024-07-01 NOTE — Anesthesia Postprocedure Evaluation (Signed)
 Anesthesia Post Note  Patient: Victoria [name]  Procedure(s) Performed: EGD (ESOPHAGOGASTRODUODENOSCOPY)  Patient location during evaluation: Endoscopy Anesthesia Type: General Level of consciousness: awake and alert Pain management: pain level controlled Vital Signs Assessment: post-procedure vital signs reviewed and stable Respiratory status: spontaneous breathing, nonlabored ventilation, respiratory function stable and patient connected to nasal cannula oxygen Cardiovascular status: blood pressure returned to baseline and stable Postop Assessment: no apparent nausea or vomiting Anesthetic complications: no   No notable events documented.   Last Vitals:  Vitals:   06/30/24 0233 06/30/24 0324  BP: 111/62 (!) 123/49  Pulse: 81 80  Resp: 14 19  Temp:  (!) 36.3 C  SpO2: 100% 96%    Last Pain:  Vitals:   06/30/24 0336  TempSrc:   PainSc: 4                  Debby Mines

## 2024-08-25 ENCOUNTER — Inpatient Hospital Stay: Attending: Oncology

## 2024-11-03 ENCOUNTER — Ambulatory Visit

## 2024-11-03 ENCOUNTER — Other Ambulatory Visit

## 2024-12-15 ENCOUNTER — Inpatient Hospital Stay: Attending: Oncology

## 2025-02-09 ENCOUNTER — Inpatient Hospital Stay: Attending: Oncology

## 2025-04-06 ENCOUNTER — Inpatient Hospital Stay: Attending: Oncology

## 2025-05-05 ENCOUNTER — Other Ambulatory Visit

## 2025-05-05 ENCOUNTER — Ambulatory Visit: Admitting: Oncology

## 2025-05-05 ENCOUNTER — Ambulatory Visit

## 2025-06-01 ENCOUNTER — Inpatient Hospital Stay: Attending: Oncology

## 2025-06-02 ENCOUNTER — Ambulatory Visit: Admitting: Dermatology
